# Patient Record
Sex: Female | Born: 1955
Health system: Southern US, Community
[De-identification: ages and names within clinical notes are randomized; demographics above are authoritative.]

## PROBLEM LIST (undated history)

## (undated) DIAGNOSIS — H269 Unspecified cataract: Secondary | ICD-10-CM

## (undated) DIAGNOSIS — S82899A Other fracture of unspecified lower leg, initial encounter for closed fracture: Secondary | ICD-10-CM

## (undated) DIAGNOSIS — E28319 Asymptomatic premature menopause: Secondary | ICD-10-CM

## (undated) DIAGNOSIS — I7300 Raynaud's syndrome without gangrene: Secondary | ICD-10-CM

## (undated) DIAGNOSIS — Z8781 Personal history of (healed) traumatic fracture: Secondary | ICD-10-CM

## (undated) DIAGNOSIS — T7840XA Allergy, unspecified, initial encounter: Secondary | ICD-10-CM

## (undated) DIAGNOSIS — Z87440 Personal history of urinary (tract) infections: Secondary | ICD-10-CM

## (undated) DIAGNOSIS — B019 Varicella without complication: Secondary | ICD-10-CM

## (undated) DIAGNOSIS — J310 Chronic rhinitis: Secondary | ICD-10-CM

## (undated) DIAGNOSIS — C4490 Unspecified malignant neoplasm of skin, unspecified: Secondary | ICD-10-CM

## (undated) DIAGNOSIS — K219 Gastro-esophageal reflux disease without esophagitis: Secondary | ICD-10-CM

## (undated) DIAGNOSIS — N159 Renal tubulo-interstitial disease, unspecified: Secondary | ICD-10-CM

## (undated) DIAGNOSIS — C259 Malignant neoplasm of pancreas, unspecified: Secondary | ICD-10-CM

## (undated) HISTORY — DX: Renal tubulo-interstitial disease, unspecified: N15.9

## (undated) HISTORY — DX: Unspecified cataract: H26.9

## (undated) HISTORY — PX: TONSILLECTOMY AND ADENOIDECTOMY: SHX28

## (undated) HISTORY — DX: Unspecified malignant neoplasm of skin, unspecified: C44.90

## (undated) HISTORY — DX: Asymptomatic premature menopause: E28.319

## (undated) HISTORY — DX: Personal history of urinary (tract) infections: Z87.440

## (undated) HISTORY — PX: JOINT REPLACEMENT: SHX530

## (undated) HISTORY — DX: Gastro-esophageal reflux disease without esophagitis: K21.9

## (undated) HISTORY — DX: Allergy, unspecified, initial encounter: T78.40XA

## (undated) HISTORY — DX: Varicella without complication: B01.9

## (undated) HISTORY — PX: CARPOMETACARPAL JOINT ARTHROTOMY: SUR102

## (undated) HISTORY — DX: Chronic rhinitis: J31.0

## (undated) HISTORY — DX: Raynaud's syndrome without gangrene: I73.00

## (undated) HISTORY — DX: Other fracture of unspecified lower leg, initial encounter for closed fracture: S82.899A

## (undated) HISTORY — PX: ABDOMINAL HYSTERECTOMY: SHX81

## (undated) HISTORY — PX: EYE SURGERY: SHX253

## (undated) HISTORY — DX: Personal history of (healed) traumatic fracture: Z87.81

---

## 1978-01-06 HISTORY — PX: ORIF ANKLE FRACTURE: SHX5408

## 1998-01-06 HISTORY — PX: TOTAL ABDOMINAL HYSTERECTOMY W/ BILATERAL SALPINGOOPHORECTOMY: SHX83

## 2003-10-11 ENCOUNTER — Ambulatory Visit: Payer: Self-pay | Admitting: Unknown Physician Specialty

## 2003-10-17 ENCOUNTER — Ambulatory Visit: Payer: Self-pay | Admitting: General Surgery

## 2003-10-19 ENCOUNTER — Ambulatory Visit: Payer: Self-pay | Admitting: Unknown Physician Specialty

## 2004-10-24 ENCOUNTER — Ambulatory Visit: Payer: Self-pay | Admitting: Unknown Physician Specialty

## 2005-10-14 ENCOUNTER — Ambulatory Visit: Payer: Self-pay | Admitting: Gastroenterology

## 2005-11-18 ENCOUNTER — Ambulatory Visit: Payer: Self-pay | Admitting: Unknown Physician Specialty

## 2006-11-24 ENCOUNTER — Ambulatory Visit: Payer: Self-pay | Admitting: Unknown Physician Specialty

## 2007-11-25 ENCOUNTER — Ambulatory Visit: Payer: Self-pay | Admitting: Unknown Physician Specialty

## 2008-11-27 ENCOUNTER — Ambulatory Visit: Payer: Self-pay | Admitting: Unknown Physician Specialty

## 2009-10-16 ENCOUNTER — Ambulatory Visit: Payer: Self-pay | Admitting: Unknown Physician Specialty

## 2010-01-23 ENCOUNTER — Ambulatory Visit: Payer: Self-pay | Admitting: Unknown Physician Specialty

## 2010-10-17 ENCOUNTER — Ambulatory Visit: Payer: Self-pay | Admitting: Unknown Physician Specialty

## 2010-10-18 ENCOUNTER — Ambulatory Visit: Payer: Self-pay | Admitting: Unknown Physician Specialty

## 2010-10-25 LAB — HM MAMMOGRAPHY: HM Mammogram: NORMAL

## 2011-03-25 ENCOUNTER — Encounter: Payer: Self-pay | Admitting: Internal Medicine

## 2011-03-25 ENCOUNTER — Ambulatory Visit (INDEPENDENT_AMBULATORY_CARE_PROVIDER_SITE_OTHER): Payer: BC Managed Care – PPO | Admitting: Internal Medicine

## 2011-03-25 VITALS — BP 120/84 | HR 68 | Temp 98.3°F | Ht 62.75 in | Wt 177.0 lb

## 2011-03-25 DIAGNOSIS — T7840XA Allergy, unspecified, initial encounter: Secondary | ICD-10-CM | POA: Insufficient documentation

## 2011-03-25 DIAGNOSIS — E28319 Asymptomatic premature menopause: Secondary | ICD-10-CM

## 2011-03-25 DIAGNOSIS — J310 Chronic rhinitis: Secondary | ICD-10-CM

## 2011-03-25 DIAGNOSIS — D235 Other benign neoplasm of skin of trunk: Secondary | ICD-10-CM

## 2011-03-25 DIAGNOSIS — J309 Allergic rhinitis, unspecified: Secondary | ICD-10-CM | POA: Insufficient documentation

## 2011-03-25 DIAGNOSIS — E669 Obesity, unspecified: Secondary | ICD-10-CM

## 2011-03-25 DIAGNOSIS — Z8781 Personal history of (healed) traumatic fracture: Secondary | ICD-10-CM | POA: Insufficient documentation

## 2011-03-25 DIAGNOSIS — D225 Melanocytic nevi of trunk: Secondary | ICD-10-CM
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
## 2011-03-25 NOTE — Patient Instructions (Signed)
.  Consider the Low Glycemic Index Diet and 6 smaller meals daily :   7 AM Low carbohydrate Protein  Shakes (EAS Carb Control  Or Atkins ,  Available everywhere,   In  cases at [employer] )  2.5 carbs  (Add or substitute a toasted sandwhich thin w/ peanut butter)  10 AM: Protein bar by Atkins (snack size,  Chocolate lover's variety at  [employer])    Lunch: sandwich on pita bread or flatbread (Joseph's makes a pita bread and a flat bread , available at [employer] and [employer]; Toufayah makes a low carb flatbread available at [employer] and [employer])   3 PM:  Mid day :  Another protein bar,  Or a  cheese stick, 1/4 cup of almonds, walnuts, pistachios, pecans, peanuts,  Macadamia nuts  6 PM  Dinner:  "mean and green:"  Meat/chicken/fish, salad, and green veggie : use ranch, vinagrette,  Blue cheese, etc  9 PM snack : Breyer's low carb fudgiscle or  ice cream bar (Carb Smart) Weight Watcher's ice cream bar , or another protein shake 

## 2011-03-25 NOTE — Assessment & Plan Note (Addendum)
prior trial of oral HRT was disastrous.  symptoms of vaginal atrophy well managed with topical/vaginal progesterone and .estriol cream.

## 2011-03-25 NOTE — Assessment & Plan Note (Signed)
Managed with astepro prescribbed by ENT after allergy testing was negative. record requested.

## 2011-03-26 ENCOUNTER — Encounter: Payer: Self-pay | Admitting: Internal Medicine

## 2011-06-04 ENCOUNTER — Other Ambulatory Visit: Payer: Self-pay | Admitting: Internal Medicine

## 2011-08-05 ENCOUNTER — Telehealth: Payer: Self-pay | Admitting: Internal Medicine

## 2011-08-05 DIAGNOSIS — Z1239 Encounter for other screening for malignant neoplasm of breast: Secondary | ICD-10-CM

## 2011-08-05 NOTE — Telephone Encounter (Signed)
Patient needs order for mammogram

## 2011-08-05 NOTE — Telephone Encounter (Signed)
Order on printer

## 2011-08-05 NOTE — Telephone Encounter (Signed)
Pt called she stated that her company does mammograms for free but she needs order to do this Appointment 10/22/11  [email or web address]  Please fax order to [phone]  ATTEN Danne Baxter

## 2011-08-06 NOTE — Telephone Encounter (Signed)
Order has been faxed to the number provided.

## 2011-10-08 LAB — HM PAP SMEAR: HM Pap smear: NORMAL

## 2011-10-22 ENCOUNTER — Ambulatory Visit: Payer: Self-pay | Admitting: Internal Medicine

## 2011-10-22 LAB — HM MAMMOGRAPHY: HM Mammogram: NORMAL

## 2011-10-24 ENCOUNTER — Other Ambulatory Visit: Payer: Self-pay

## 2011-10-27 MED ORDER — MELOXICAM 15 MG PO TABS: 15.0000 mg | ORAL_TABLET | Freq: Every day | ORAL | Status: DC

## 2011-10-27 MED ORDER — ESOMEPRAZOLE MAGNESIUM 40 MG PO CPDR: 40.0000 mg | DELAYED_RELEASE_CAPSULE | Freq: Every day | ORAL | Status: DC

## 2011-10-27 NOTE — Telephone Encounter (Signed)
R'cd fax from [facility] for refill of Meloxicam and Nexium.

## 2011-10-29 ENCOUNTER — Ambulatory Visit (INDEPENDENT_AMBULATORY_CARE_PROVIDER_SITE_OTHER): Payer: BC Managed Care – PPO | Admitting: Internal Medicine

## 2011-10-29 ENCOUNTER — Encounter: Payer: Self-pay | Admitting: Internal Medicine

## 2011-10-29 ENCOUNTER — Other Ambulatory Visit (HOSPITAL_COMMUNITY)
Admission: RE | Admit: 2011-10-29 | Discharge: 2011-10-29 | Disposition: A | Discharge status: Home / Self Care | Payer: BC Managed Care – PPO | Source: Ambulatory Visit | Attending: Internal Medicine | Admitting: Internal Medicine

## 2011-10-29 VITALS — BP 120/78 | HR 87 | Temp 97.5°F | Ht 62.75 in | Wt 181.0 lb

## 2011-10-29 DIAGNOSIS — R5383 Other fatigue: Secondary | ICD-10-CM

## 2011-10-29 DIAGNOSIS — Z01419 Encounter for gynecological examination (general) (routine) without abnormal findings: Secondary | ICD-10-CM | POA: Insufficient documentation

## 2011-10-29 DIAGNOSIS — Z0000 Encounter for general adult medical examination without abnormal findings: Secondary | ICD-10-CM

## 2011-10-29 DIAGNOSIS — Z23 Encounter for immunization: Secondary | ICD-10-CM

## 2011-10-29 DIAGNOSIS — Z1151 Encounter for screening for human papillomavirus (HPV): Secondary | ICD-10-CM | POA: Insufficient documentation

## 2011-10-29 DIAGNOSIS — M76899 Other specified enthesopathies of unspecified lower limb, excluding foot: Secondary | ICD-10-CM

## 2011-10-29 DIAGNOSIS — R5381 Other malaise: Secondary | ICD-10-CM

## 2011-10-29 DIAGNOSIS — Z13220 Encounter for screening for lipoid disorders: Secondary | ICD-10-CM

## 2011-10-29 DIAGNOSIS — M7072 Other bursitis of hip, left hip: Secondary | ICD-10-CM | POA: Insufficient documentation

## 2011-10-29 LAB — LIPID PANEL
Cholesterol: 228 mg/dL — ABNORMAL HIGH (ref 0–200)
Total CHOL/HDL Ratio: 5
Triglycerides: 172 mg/dL — ABNORMAL HIGH (ref 0.0–149.0)

## 2011-10-29 LAB — COMPREHENSIVE METABOLIC PANEL
AST: 20 U/L (ref 0–37)
Albumin: 3.9 g/dL (ref 3.5–5.2)
Alkaline Phosphatase: 53 U/L (ref 39–117)
Total Protein: 7.6 g/dL (ref 6.0–8.3)

## 2011-10-29 NOTE — Assessment & Plan Note (Signed)
Referral to [facility] for joint injection .  Apparently Dr Lin Givens was injecting the mid lateral thigh.

## 2011-10-29 NOTE — Progress Notes (Signed)
Patient ID: Kathleen Russell, female   DOB: [birth year], 56 y.o.   MRN: [id number]    Subjective:     Kathleen Russell is a 56 y.o. female and is here for a comprehensive physical exam. The patient reports .  History   Social History  . Marital Status: Married    Spouse Name: N/A    Number of Children: N/A  . Years of Education: N/A   Occupational History  . editor     [employer]   Social History Main Topics  . Smoking status: Never Smoker   . Smokeless tobacco: Never Used  . Alcohol Use: No  . Drug Use: No  . Sexually Active:    Other Topics Concern  . Not on file   Social History Narrative  . No narrative on file   Health Maintenance  Topic Date Due  . Pap Smear  07/12/1972  . Influenza Vaccine  09/06/2012  . Mammogram  10/28/2013  . Colonoscopy  03/25/2014  . Tetanus/tdap  10/28/2021    The following portions of the patient's history were reviewed and updated as appropriate: allergies, current medications, past family history, past medical history, past social history, past surgical history and problem list.  Review of Systems A comprehensive review of systems was negative except for: Musculoskeletal: positive for arthralgias   Objective:   BP 120/78  Pulse 87  Temp 97.5 F (36.4 C)  Ht 5' 2.75" (1.594 m)  Wt 181 lb (82.101 kg)  BMI 32.32 kg/m2  SpO2 98%    General appearance: alert, cooperative and appears stated age Ears: normal TM's and external ear canals both ears Throat: lips, mucosa, and tongue normal; teeth and gums normal Neck: no adenopathy, no carotid bruit, supple, symmetrical, trachea midline and thyroid not enlarged, symmetric, no tenderness/mass/nodules Back: symmetric, no curvature. ROM normal. No CVA tenderness. Lungs: clear to auscultation bilaterally Breasts: breasts appear normal, no suspicious masses, no skin or nipple changes or axillary nodes. Heart: regular rate and rhythm, S1, S2 normal, no murmur, click, rub or  gallop Abdomen: soft, non-tender; bowel sounds normal; no masses,  no organomegaly Pulses: 2+ and symmetric Skin: Skin color, texture, turgor normal. No rashes or lesions Lymph nodes: Cervical, supraclavicular, and axillary nodes normal.   Assessment:   Bursitis of left hip Referral to [facility] for joint injection .  Apparently Dr Lin Givens was injecting the mid lateral thigh.   Routine general medical examination at a health care facility Breast exam is normal. She is up-to-date on mammograms. Pelvic was deferred since she is status post TAH/BSO.   Updated Medication List Outpatient Encounter Prescriptions as of 10/29/2011  Medication Sig Dispense Refill  . ASTEPRO 0.15 % SOLN       . Calcium-Vitamin D (CALTRATE 600 PLUS-VIT D PO) Take by mouth.      . cetirizine (ZYRTEC) 10 MG tablet Take 10 mg by mouth daily.      Marland Kitchen conjugated estrogens (PREMARIN) vaginal cream Place vaginally daily.      . cyanocobalamin 100 MCG tablet Take 1,000 mcg by mouth daily.      Marland Kitchen esomeprazole (NEXIUM) 40 MG capsule Take 1 capsule (40 mg total) by mouth daily before breakfast.  30 capsule  5  . Estriol POWD       . meloxicam (MOBIC) 15 MG tablet Take 1 tablet (15 mg total) by mouth daily.  30 tablet  5  . Multiple Vitamins-Minerals (CENTRUM SILVER ULTRA WOMENS PO) Take by mouth.      Marland Kitchen  vitamin E 1000 UNIT capsule Take 1,000 Units by mouth daily.

## 2011-10-31 ENCOUNTER — Encounter: Payer: Self-pay | Admitting: Internal Medicine

## 2011-10-31 DIAGNOSIS — Z0000 Encounter for general adult medical examination without abnormal findings: Secondary | ICD-10-CM | POA: Insufficient documentation

## 2011-10-31 NOTE — Assessment & Plan Note (Signed)
Breast exam is normal. She is up-to-date on mammograms. Pelvic was deferred since she is status post TAH/BSO.

## 2012-02-12 ENCOUNTER — Other Ambulatory Visit: Payer: Self-pay | Admitting: *Deleted

## 2012-02-12 NOTE — Telephone Encounter (Signed)
Refill request  Estriol 1mg /gm Vag cream  #60  Use 0.5gm vaginally once to twice a week as needed

## 2012-02-16 ENCOUNTER — Telehealth: Payer: Self-pay | Admitting: *Deleted

## 2012-02-16 NOTE — Telephone Encounter (Signed)
2nd request on this med originally sent to you on 2/6. Ok to fill?

## 2012-02-16 NOTE — Telephone Encounter (Signed)
Estriol 1mg /gm vag cream   #60  Use 0.5mg -1gm vaginally once to twice a week as needed

## 2012-02-16 NOTE — Telephone Encounter (Signed)
There is no such thing in EPIC as "Estriol" cream, only powder.  We have not filled it for her before but will be happy to if you will clarify with pharmacy.  Perhaps they mean Estrace?

## 2012-02-18 ENCOUNTER — Other Ambulatory Visit: Payer: Self-pay | Admitting: General Practice

## 2012-02-18 MED ORDER — ESTRIOL POWD: Status: DC

## 2012-02-18 NOTE — Telephone Encounter (Signed)
Called pharmacy and put in this order. Also updated pt record.

## 2012-04-30 ENCOUNTER — Other Ambulatory Visit: Payer: Self-pay | Admitting: *Deleted

## 2012-04-30 MED ORDER — ESOMEPRAZOLE MAGNESIUM 40 MG PO CPDR: 40.0000 mg | DELAYED_RELEASE_CAPSULE | Freq: Every day | ORAL | Status: DC

## 2012-07-01 ENCOUNTER — Telehealth: Payer: Self-pay | Admitting: *Deleted

## 2012-07-01 NOTE — Telephone Encounter (Signed)
Refill Request  Progesterone 3% HRT Cream  Apply topically nightly as directed

## 2012-07-08 MED ORDER — ESTROGENS, CONJUGATED 0.625 MG/GM VA CREA: TOPICAL_CREAM | Freq: Every day | VAGINAL | Status: DC

## 2012-07-08 NOTE — Telephone Encounter (Signed)
Refill sent with note patient needs follow up appointment.

## 2012-08-02 ENCOUNTER — Ambulatory Visit (INDEPENDENT_AMBULATORY_CARE_PROVIDER_SITE_OTHER): Payer: BC Managed Care – PPO | Admitting: Adult Health

## 2012-08-02 VITALS — BP 110/68 | HR 114 | Temp 98.6°F | Resp 12 | Wt 184.5 lb

## 2012-08-02 DIAGNOSIS — R197 Diarrhea, unspecified: Secondary | ICD-10-CM | POA: Insufficient documentation

## 2012-08-02 DIAGNOSIS — K529 Noninfective gastroenteritis and colitis, unspecified: Secondary | ICD-10-CM | POA: Insufficient documentation

## 2012-08-02 LAB — CBC WITH DIFFERENTIAL/PLATELET
Eosinophils Relative: 1 % (ref 0.0–5.0)
HCT: 42.7 % (ref 36.0–46.0)
Hemoglobin: 14.3 g/dL (ref 12.0–15.0)
Lymphocytes Relative: 27.5 % (ref 12.0–46.0)
Lymphs Abs: 1.6 10*3/uL (ref 0.7–4.0)
Monocytes Relative: 12 % (ref 3.0–12.0)
Neutro Abs: 3.5 10*3/uL (ref 1.4–7.7)
Platelets: 227 10*3/uL (ref 150.0–400.0)
WBC: 5.9 10*3/uL (ref 4.5–10.5)

## 2012-08-02 LAB — COMPREHENSIVE METABOLIC PANEL
CO2: 26 mEq/L (ref 19–32)
Calcium: 9 mg/dL (ref 8.4–10.5)
Creatinine, Ser: 0.9 mg/dL (ref 0.4–1.2)
GFR: 67.47 mL/min (ref >60.00)
Glucose, Bld: 119 mg/dL — ABNORMAL HIGH (ref 70–99)
Total Bilirubin: 0.5 mg/dL (ref 0.3–1.2)

## 2012-08-02 NOTE — Assessment & Plan Note (Addendum)
Suspect viral source. Check cbc, bmet. If white count elevated will start antibiotic. Bland diet and advance as tolerated. Instructions given to patient.

## 2012-08-02 NOTE — Progress Notes (Signed)
  Subjective:    Patient ID: Kathleen Russell, female    DOB: 1955-12-28, 57 y.o.   MRN: [id number]  HPI  Patient is a pleasant 57 y/o female who presents with chills, diarrhea, cramping that began on Thursday night. She vomited once on Saturday. She ran a fever of 102. She reports fever broke on Sunday morning when she began to feel better. She has been drinking Pedialyte. She has not been eating. She has not been drinking much either. Today she reports that her symptoms are slowing improving. No fever this morning. No diarrhea.  Current Outpatient Prescriptions on File Prior to Visit  Medication Sig Dispense Refill  . Calcium-Vitamin D (CALTRATE 600 PLUS-VIT D PO) Take by mouth.      . cetirizine (ZYRTEC) 10 MG tablet Take 10 mg by mouth daily.      Marland Kitchen esomeprazole (NEXIUM) 40 MG capsule Take 1 capsule (40 mg total) by mouth daily before breakfast.  30 capsule  5  . Estriol POWD Pt uses 1/2-1g vaginally one to two times weekly as needed.  60 g  2  . meloxicam (MOBIC) 15 MG tablet Take 1 tablet (15 mg total) by mouth daily.  30 tablet  5  . cyanocobalamin 100 MCG tablet Take 1,000 mcg by mouth daily.       No current facility-administered medications on file prior to visit.    Review of Systems  Constitutional: Positive for fever and chills.  Respiratory: Negative.   Cardiovascular: Negative.   Gastrointestinal: Positive for vomiting, abdominal pain and diarrhea.       Cramping  Neurological: Positive for light-headedness.  Psychiatric/Behavioral: Negative.    BP 110/68  Pulse 114  Temp(Src) 98.6 F (37 C) (Oral)  Resp 12  Wt 184 lb 8 oz (83.689 kg)  BMI 32.94 kg/m2  SpO2 97%    Objective:   Physical Exam  Constitutional: She is oriented to person, place, and time. No distress.  Overweight, pleasant female appears to not be feeling well.  Cardiovascular: Regular rhythm and normal heart sounds.  Exam reveals no gallop and no friction rub.   No murmur heard. tachycardia   Pulmonary/Chest: Effort normal and breath sounds normal. No respiratory distress.  Abdominal: Soft. Bowel sounds are normal. She exhibits no mass. There is tenderness. There is no rebound.  Neurological: She is alert and oriented to person, place, and time.  Skin: Skin is warm and dry.  Psychiatric: She has a normal mood and affect. Her behavior is normal. Judgment and thought content normal.       Assessment & Plan:

## 2012-08-02 NOTE — Patient Instructions (Addendum)
Diarrhea Diarrhea is frequent loose and watery bowel movements. It can cause you to feel weak and dehydrated. Dehydration can cause you to become tired and thirsty, have a dry mouth, and have decreased urination that often is dark yellow. Diarrhea is a sign of another problem, most often an infection that will not last long. In most cases, diarrhea typically lasts 2 3 days. However, it can last longer if it is a sign of something more serious. It is important to treat your diarrhea as directed by your caregive to lessen or prevent future episodes of diarrhea. CAUSES  Some common causes include:  Gastrointestinal infections caused by viruses, bacteria, or parasites.  Food poisoning or food allergies.  Certain medicines, such as antibiotics, chemotherapy, and laxatives.  Artificial sweeteners and fructose.  Digestive disorders. HOME CARE INSTRUCTIONS  Ensure adequate fluid intake (hydration): have 1 cup (8 oz) of fluid for each diarrhea episode. Avoid fluids that contain simple sugars or sports drinks, fruit juices, whole milk products, and sodas. Your urine should be clear or pale yellow if you are drinking enough fluids. Hydrate with an oral rehydration solution that you can purchase at pharmacies, retail stores, and online. You can prepare an oral rehydration solution at home by mixing the following ingredients together:    tsp table salt.   tsp baking soda.   tsp salt substitute containing potassium chloride.  1  tablespoons sugar.  1 L (34 oz) of water.  Certain foods and beverages may increase the speed at which food moves through the gastrointestinal (GI) tract. These foods and beverages should be avoided and include:  Caffeinated and alcoholic beverages.  High-fiber foods, such as raw fruits and vegetables, nuts, seeds, and whole grain breads and cereals.  Foods and beverages sweetened with sugar alcohols, such as xylitol, sorbitol, and mannitol.  Some foods may be well  tolerated and may help thicken stool including:  Starchy foods, such as rice, toast, pasta, low-sugar cereal, oatmeal, grits, baked potatoes, crackers, and bagels.  Bananas.  Applesauce.  Add probiotic-rich foods to help increase healthy bacteria in the GI tract, such as yogurt and fermented milk products.  Wash your hands well after each diarrhea episode.  Only take over-the-counter or prescription medicines as directed by your caregiver.  Take a warm bath to relieve any burning or pain from frequent diarrhea episodes. SEEK IMMEDIATE MEDICAL CARE IF:   You are unable to keep fluids down.  You have persistent vomiting.  You have blood in your stool, or your stools are black and tarry.  You do not urinate in 6 8 hours, or there is only a small amount of very dark urine.  You have abdominal pain that increases or localizes.  You have weakness, dizziness, confusion, or lightheadedness.  You have a severe headache.  Your diarrhea gets worse or does not get better.  You have a fever or persistent symptoms for more than 2 3 days.  You have a fever and your symptoms suddenly get worse. MAKE SURE YOU:   Understand these instructions.  Will watch your condition.  Will get help right away if you are not doing well or get worse. Document Released: 12/13/2001 Document Revised: 12/10/2011 Document Reviewed: 08/31/2011 [facility] Patient Information 2014 [place], [place]. Diarrhea Diarrhea is frequent loose and watery bowel movements. It can cause you to feel weak and dehydrated. Dehydration can cause you to become tired and thirsty, have a dry mouth, and have decreased urination that often is dark yellow. Diarrhea is a  sign of another problem, most often an infection that will not last long. In most cases, diarrhea typically lasts 2 3 days. However, it can last longer if it is a sign of something more serious. It is important to treat your diarrhea as directed by your caregive to lessen  or prevent future episodes of diarrhea. CAUSES  Some common causes include:  Gastrointestinal infections caused by viruses, bacteria, or parasites.  Food poisoning or food allergies.  Certain medicines, such as antibiotics, chemotherapy, and laxatives.  Artificial sweeteners and fructose.  Digestive disorders. HOME CARE INSTRUCTIONS  Ensure adequate fluid intake (hydration): have 1 cup (8 oz) of fluid for each diarrhea episode. Avoid fluids that contain simple sugars or sports drinks, fruit juices, whole milk products, and sodas. Your urine should be clear or pale yellow if you are drinking enough fluids. Hydrate with an oral rehydration solution that you can purchase at pharmacies, retail stores, and online. You can prepare an oral rehydration solution at home by mixing the following ingredients together:    tsp table salt.   tsp baking soda.   tsp salt substitute containing potassium chloride.  1  tablespoons sugar.  1 L (34 oz) of water.  Certain foods and beverages may increase the speed at which food moves through the gastrointestinal (GI) tract. These foods and beverages should be avoided and include:  Caffeinated and alcoholic beverages.  High-fiber foods, such as raw fruits and vegetables, nuts, seeds, and whole grain breads and cereals.  Foods and beverages sweetened with sugar alcohols, such as xylitol, sorbitol, and mannitol.  Some foods may be well tolerated and may help thicken stool including:  Starchy foods, such as rice, toast, pasta, low-sugar cereal, oatmeal, grits, baked potatoes, crackers, and bagels.  Bananas.  Applesauce.  Add probiotic-rich foods to help increase healthy bacteria in the GI tract, such as yogurt and fermented milk products.  Wash your hands well after each diarrhea episode.  Only take over-the-counter or prescription medicines as directed by your caregiver.  Take a warm bath to relieve any burning or pain from frequent diarrhea  episodes. SEEK IMMEDIATE MEDICAL CARE IF:   You are unable to keep fluids down.  You have persistent vomiting.  You have blood in your stool, or your stools are black and tarry.  You do not urinate in 6 8 hours, or there is only a small amount of very dark urine.  You have abdominal pain that increases or localizes.  You have weakness, dizziness, confusion, or lightheadedness.  You have a severe headache.  Your diarrhea gets worse or does not get better.  You have a fever or persistent symptoms for more than 2 3 days.  You have a fever and your symptoms suddenly get worse. MAKE SURE YOU:   Understand these instructions.  Will watch your condition.  Will get help right away if you are not doing well or get worse. Document Released: 12/13/2001 Document Revised: 12/10/2011 Document Reviewed: 08/31/2011 [facility] Patient Information 2014 [place], [place].

## 2012-08-03 ENCOUNTER — Encounter: Payer: Self-pay | Admitting: Adult Health

## 2012-08-03 ENCOUNTER — Other Ambulatory Visit: Payer: Self-pay | Admitting: *Deleted

## 2012-08-03 NOTE — Telephone Encounter (Signed)
Refill Request  Progesterone 3% HRT CRM   Apply topically nightly as directed

## 2012-08-06 NOTE — Telephone Encounter (Signed)
Do you refill the progesterone cream? Refill Meloxicam? Last visit 10/13

## 2012-08-09 ENCOUNTER — Other Ambulatory Visit: Payer: Self-pay | Admitting: *Deleted

## 2012-08-13 MED ORDER — MELOXICAM 15 MG PO TABS: 15.0000 mg | ORAL_TABLET | Freq: Every day | ORAL | Status: DC

## 2012-08-13 NOTE — Telephone Encounter (Signed)
cpe was 10/13. Refill?

## 2012-08-16 ENCOUNTER — Other Ambulatory Visit: Payer: Self-pay | Admitting: Internal Medicine

## 2012-08-16 MED ORDER — MELOXICAM 15 MG PO TABS: 15.0000 mg | ORAL_TABLET | Freq: Every day | ORAL | Status: DC

## 2012-08-16 MED ORDER — ESTRIOL POWD: Status: DC

## 2012-08-16 NOTE — Telephone Encounter (Signed)
Yes, both authorized.

## 2012-08-17 MED ORDER — NONFORMULARY OR COMPOUNDED ITEM: Status: DC

## 2012-09-14 ENCOUNTER — Telehealth: Payer: Self-pay | Admitting: Internal Medicine

## 2012-09-14 DIAGNOSIS — Z1239 Encounter for other screening for malignant neoplasm of breast: Secondary | ICD-10-CM

## 2012-09-14 NOTE — Telephone Encounter (Signed)
Pt called stating they do mammogram at her work and she wanted to know if you would send order to them Appointment scheduled for 10/9   Please fax to  [phone]  Danne Baxter @ [facility] Phone # [phone]  Please advise pt when this is done

## 2012-09-14 NOTE — Telephone Encounter (Signed)
Order made

## 2012-09-20 ENCOUNTER — Encounter: Payer: Self-pay | Admitting: Internal Medicine

## 2012-09-20 DIAGNOSIS — Z1239 Encounter for other screening for malignant neoplasm of breast: Secondary | ICD-10-CM

## 2012-09-21 ENCOUNTER — Telehealth: Payer: Self-pay | Admitting: Internal Medicine

## 2012-09-21 DIAGNOSIS — Z1239 Encounter for other screening for malignant neoplasm of breast: Secondary | ICD-10-CM

## 2012-09-21 NOTE — Telephone Encounter (Signed)
Patient requesting an order for mammogram per letter in yellow chart. rx on printer to sign

## 2012-09-24 NOTE — Telephone Encounter (Signed)
Pati8ent notified by detailed message script for [facility] faxed as requested.

## 2012-10-14 ENCOUNTER — Ambulatory Visit: Payer: Self-pay | Admitting: Internal Medicine

## 2012-11-02 ENCOUNTER — Other Ambulatory Visit: Payer: Self-pay | Admitting: *Deleted

## 2012-11-02 ENCOUNTER — Encounter: Payer: Self-pay | Admitting: Internal Medicine

## 2012-11-02 MED ORDER — ESOMEPRAZOLE MAGNESIUM 40 MG PO CPDR: 40.0000 mg | DELAYED_RELEASE_CAPSULE | Freq: Every day | ORAL | Status: DC

## 2012-11-02 NOTE — Telephone Encounter (Signed)
Appt 11/05/12 

## 2012-11-05 ENCOUNTER — Encounter: Payer: Self-pay | Admitting: Internal Medicine

## 2012-11-05 ENCOUNTER — Ambulatory Visit (INDEPENDENT_AMBULATORY_CARE_PROVIDER_SITE_OTHER): Payer: BC Managed Care – PPO | Admitting: Internal Medicine

## 2012-11-05 VITALS — BP 126/80 | HR 85 | Temp 98.0°F | Resp 12 | Ht 63.0 in | Wt 193.5 lb

## 2012-11-05 DIAGNOSIS — Z0000 Encounter for general adult medical examination without abnormal findings: Secondary | ICD-10-CM

## 2012-11-05 DIAGNOSIS — R635 Abnormal weight gain: Secondary | ICD-10-CM

## 2012-11-05 DIAGNOSIS — E669 Obesity, unspecified: Secondary | ICD-10-CM

## 2012-11-05 DIAGNOSIS — Z1159 Encounter for screening for other viral diseases: Secondary | ICD-10-CM

## 2012-11-05 DIAGNOSIS — E785 Hyperlipidemia, unspecified: Secondary | ICD-10-CM

## 2012-11-05 DIAGNOSIS — R739 Hyperglycemia, unspecified: Secondary | ICD-10-CM

## 2012-11-05 DIAGNOSIS — Z1211 Encounter for screening for malignant neoplasm of colon: Secondary | ICD-10-CM

## 2012-11-05 DIAGNOSIS — R7309 Other abnormal glucose: Secondary | ICD-10-CM

## 2012-11-05 DIAGNOSIS — R350 Frequency of micturition: Secondary | ICD-10-CM

## 2012-11-05 LAB — LIPID PANEL
Cholesterol: 225 mg/dL — ABNORMAL HIGH (ref 0–200)
VLDL: 27.8 mg/dL (ref 0.0–40.0)

## 2012-11-05 LAB — TSH: TSH: 4.09 u[IU]/mL (ref 0.35–5.50)

## 2012-11-05 LAB — COMPREHENSIVE METABOLIC PANEL
ALT: 16 U/L (ref 0–35)
AST: 18 U/L (ref 0–37)
Albumin: 4 g/dL (ref 3.5–5.2)
Alkaline Phosphatase: 58 U/L (ref 39–117)
Chloride: 103 mEq/L (ref 96–112)
Potassium: 4.3 mEq/L (ref 3.5–5.1)
Sodium: 138 mEq/L (ref 135–145)
Total Protein: 7.1 g/dL (ref 6.0–8.3)

## 2012-11-05 LAB — LDL CHOLESTEROL, DIRECT: Direct LDL: 159.1 mg/dL

## 2012-11-05 NOTE — Patient Instructions (Addendum)
I am screening your for diabetes today given your weight gain and elevated blood sugar  You goal weight is 168 lbs (to get lower your BMI  To < 30)  This is  my version of a  "Low GI"  Diet:  It will still lower your blood sugars and allow you to lose 4 to 8  lbs  per month if you follow it carefully.  Your goal with exercise is a minimum of 30 minutes of aerobic exercise 5 days per week (Walking does not count once it becomes easy!)    All of the foods can be found at grocery stores and in bulk at [employer].  The Atkins protein bars and shakes are available in more varieties at Target, WalMart and [employer].     7 AM Breakfast:  Choose from the following:  Low carbohydrate Protein  Shakes (I recommend the EAS AdvantEdge "Carb Control" shakes  Or the low carb shakes by Atkins.    2.5 carbs   Arnold's "Sandwhich Thin"toasted  w/ peanut butter (no jelly: about 20 net carbs  "Bagel Thin" with cream cheese and salmon: about 20 carbs   a scrambled egg/bacon/cheese burrito made with Mission's "carb balance" whole wheat tortilla  (about 10 net carbs )   Avoid cereal and bananas, oatmeal and cream of wheat and grits. They are loaded with carbohydrates!   10 AM: high protein snack  Protein bar by Atkins (the snack size, under 200 cal, usually < 6 net carbs).    A stick of cheese:  Around 1 carb,  100 cal     Dannon Light n Fit [place] Yogurt  (80 cal, 8 carbs)  Other so called "protein bars" and Greek yogurts tend to be loaded with carbohydrates.  Remember, in food advertising, the word "energy" is synonymous for " carbohydrate."  Lunch:   A Sandwich using the bread choices listed, Can use any  Eggs,  lunchmeat, grilled meat or canned tuna), avocado, regular mayo/mustard  and cheese.  A Salad using blue cheese, ranch,  Goddess or vinagrette,  No croutons or "confetti" and no "candied nuts" but regular nuts OK.   No pretzels or chips.  Pickles and miniature sweet peppers are a good low carb  alternative that provide a "crunch"  The bread is the only source of carbohydrate in a sandwich and  can be decreased by trying some of these alternatives to traditional loaf bread  Joseph's makes a pita bread and a flat bread that are 50 cal and 4 net carbs available at BJs and WalMart.  This can be toasted to use with hummous as well  Toufayan makes a low carb flatbread that's 100 cal and 9 net carbs available at [employer] and [employer] makes 2 sizes of  Low carb whole wheat tortilla  (The large one is 210 cal and 6 net carbs) Avoid "Low fat dressings, as well as Reyne Dumas and [address] dressings They are loaded with sugar!   3 PM/ Mid day  Snack:  Consider  1 ounce of  almonds, walnuts, pistachios, pecans, peanuts,  Macadamia nuts or a nut medley.  Avoid "granola"; the dried cranberries and raisins are loaded with carbohydrates. Mixed nuts as long as there are no raisins,  cranberries or dried fruit.     6 PM  Dinner:     Meat/fowl/fish with a green salad, and either broccoli, cauliflower, green beans, spinach, brussel sprouts or  Lima beans. DO NOT BREAD  THE PROTEIN!!      There is a low carb pasta by Dreamfield's that is acceptable and tastes great: only 5 digestible carbs/serving.( All grocery stores but BJs carry it )  Try Hurley Cisco Angelo's chicken piccata or chicken or eggplant parm over low carb pasta.(Lowes and BJs)   Marjory Lies Sanchez's "Carnitas" (pulled pork, no sauce,  0 carbs) or his beef pot roast to make a dinner burrito (at BJ's)  Pesto over low carb pasta (bj's sells a good quality pesto in the center refrigerated section of the deli   Whole wheat pasta is still full of digestible carbs and  Not as low in glycemic index as Dreamfield's.   Brown rice is still rice,  So skip the rice and noodles if you eat [place] or [place] (or at least limit to 1/2 cup)  9 PM snack :   Breyer's "low carb" fudgsicle or  ice cream bar (Carb Smart line), or  Weight Watcher's ice cream bar ,  or another "no sugar added" ice cream;  a serving of fresh berries/cherries with whipped cream   Cheese or DANNON'S LlGHT N FIT [place] YOGURT  Avoid bananas, pineapple, grapes  and watermelon on a regular basis because they are high in sugar.  THINK OF THEM AS DESSERT  Remember that snack Substitutions should be less than 10 NET carbs per serving and meals < 20 carbs. Remember to subtract fiber grams to get the "net carbs."

## 2012-11-06 LAB — HEPATITIS C ANTIBODY: HCV Ab: NEGATIVE

## 2012-11-07 ENCOUNTER — Encounter: Payer: Self-pay | Admitting: Internal Medicine

## 2012-11-07 DIAGNOSIS — E785 Hyperlipidemia, unspecified: Secondary | ICD-10-CM | POA: Insufficient documentation

## 2012-11-07 NOTE — Progress Notes (Signed)
Patient ID: Kathleen Russell, female   DOB: April 21, 1955, 57 y.o.   MRN: [id number]  Subjective:     Kathleen Russell is a 57 y.o. female and is here for a comprehensive physical exam. The patient reports no problems.  History   Social History  . Marital Status: Married    Spouse Name: N/A    Number of Children: N/A  . Years of Education: N/A   Occupational History  . editor     [employer]   Social History Main Topics  . Smoking status: Never Smoker   . Smokeless tobacco: Never Used  . Alcohol Use: No  . Drug Use: No  . Sexual Activity: Yes    Birth Control/ Protection: Post-menopausal   Other Topics Concern  . Not on file   Social History Narrative  . No narrative on file   Health Maintenance  Topic Date Due  . Influenza Vaccine  08/06/2013  . Colonoscopy  03/25/2014  . Mammogram  10/15/2014  . Pap Smear  10/29/2014  . Tetanus/tdap  10/28/2021    The following portions of the patient's history were reviewed and updated as appropriate: allergies, current medications, past family history, past medical history, past social history, past surgical history and problem list.  Review of Systems A comprehensive review of systems was negative.   Objective:   BP 126/80  Pulse 85  Temp(Src) 98 F (36.7 C) (Oral)  Resp 12  Ht 5\' 3"  (1.6 m)  Wt 193 lb 8 oz (87.771 kg)  BMI 34.29 kg/m2  SpO2 97%  General Appearance:    Alert, cooperative, no distress, appears stated age  Head:    Normocephalic, without obvious abnormality, atraumatic  Eyes:    PERRL, conjunctiva/corneas clear, EOM's intact, fundi    benign, both eyes  Ears:    Normal TM's and external ear canals, both ears  Nose:   Nares normal, septum midline, mucosa normal, no drainage    or sinus tenderness  Throat:   Lips, mucosa, and tongue normal; teeth and gums normal  Neck:   Supple, symmetrical, trachea midline, no adenopathy;    thyroid:  no enlargement/tenderness/nodules; no carotid   bruit  or JVD  Back:     Symmetric, no curvature, ROM normal, no CVA tenderness  Lungs:     Clear to auscultation bilaterally, respirations unlabored  Chest Wall:    No tenderness or deformity   Heart:    Regular rate and rhythm, S1 and S2 normal, no murmur, rub   or gallop  Breast Exam:    No tenderness, masses, or nipple abnormality  Abdomen:     Soft, non-tender, bowel sounds active all four quadrants,    no masses, no organomegaly  Extremities:   Extremities normal, atraumatic, no cyanosis or edema  Pulses:   2+ and symmetric all extremities  Skin:   Skin color, texture, turgor normal, no rashes or lesions  Lymph nodes:   Cervical, supraclavicular, and axillary nodes normal  Neurologic:   CNII-XII intact, normal strength, sensation and reflexes    throughout   Assessment and Plan:  Routine general medical examination at a health care facility Annual comprehensive exam was done including breast and  Pelvic without PAP smear. All screenings have been addressed .   Obesity (BMI 30-39.9) I have addressed  BMI and recommended a low glycemic index diet utilizing smaller more frequent meals to increase metabolism.  I have also recommended that patient start exercising with a goal of 30 minutes  of aerobic exercise a minimum of 5 days per week. Screening for lipid disorders, thyroid and diabetes has been done .    Updated Medication List Outpatient Encounter Prescriptions as of 11/05/2012  Medication Sig  . Calcium-Vitamin D (CALTRATE 600 PLUS-VIT D PO) Take by mouth.  . cetirizine (ZYRTEC) 10 MG tablet Take 10 mg by mouth daily.  . cromolyn (NASALCROM) 5.2 MG/ACT nasal spray Place 1 spray into the nose at bedtime.  . cyanocobalamin 100 MCG tablet Take 1,000 mcg by mouth daily.  Marland Kitchen esomeprazole (NEXIUM) 40 MG capsule Take 1 capsule (40 mg total) by mouth daily before breakfast.  . Estriol POWD Pt uses 1/2-1g vaginally one to two times weekly as needed.  . meloxicam (MOBIC) 15 MG tablet Take 1  tablet (15 mg total) by mouth daily.  . NONFORMULARY OR COMPOUNDED ITEM Progesterone 3% HRT cream apply topically nightly as directed

## 2012-11-07 NOTE — Addendum Note (Signed)
Addended by: Sherlene Shams on: 11/07/2012 09:00 AM   Modules accepted: Orders

## 2012-11-07 NOTE — Assessment & Plan Note (Signed)
I have addressed  BMI and recommended a low glycemic index diet utilizing smaller more frequent meals to increase metabolism.  I have also recommended that patient start exercising with a goal of 30 minutes of aerobic exercise a minimum of 5 days per week. Screening for lipid disorders, thyroid and diabetes has  been done  °

## 2012-11-07 NOTE — Assessment & Plan Note (Signed)
Her 10 yr risk of CAD is 7.13%  .  Recommended repeat lipids in 6 months and low cholesterol diet.

## 2012-11-07 NOTE — Assessment & Plan Note (Signed)
Annual comprehensive exam was done including breast and  Pelvic without PAP smear. All screenings have been addressed .

## 2012-11-09 NOTE — Telephone Encounter (Signed)
Mailed unread message to pt  

## 2012-11-12 ENCOUNTER — Other Ambulatory Visit: Payer: BC Managed Care – PPO

## 2012-11-15 LAB — FECAL OCCULT BLOOD, IMMUNOCHEMICAL: Fecal Occult Bld: NEGATIVE

## 2012-11-16 ENCOUNTER — Encounter: Payer: Self-pay | Admitting: Internal Medicine

## 2013-02-16 ENCOUNTER — Encounter: Payer: Self-pay | Admitting: Internal Medicine

## 2013-02-17 MED ORDER — ESTROGENS, CONJUGATED 0.625 MG/GM VA CREA: TOPICAL_CREAM | Freq: Every day | VAGINAL | Status: DC

## 2013-05-05 ENCOUNTER — Other Ambulatory Visit: Payer: Self-pay | Admitting: Internal Medicine

## 2013-09-20 ENCOUNTER — Other Ambulatory Visit: Payer: Self-pay | Admitting: Internal Medicine

## 2013-09-20 NOTE — Telephone Encounter (Signed)
LAst appt 11/05/12, appt scheduled for 11/11/13. Refill?

## 2013-09-23 NOTE — Telephone Encounter (Signed)
Ok to refill,  Refill sent  

## 2013-10-25 ENCOUNTER — Encounter: Payer: Self-pay | Admitting: *Deleted

## 2013-10-25 ENCOUNTER — Ambulatory Visit: Payer: Self-pay | Admitting: Internal Medicine

## 2013-10-25 LAB — HM MAMMOGRAPHY: HM Mammogram: NEGATIVE

## 2013-10-31 ENCOUNTER — Other Ambulatory Visit: Payer: Self-pay | Admitting: Internal Medicine

## 2013-11-11 ENCOUNTER — Encounter: Payer: BC Managed Care – PPO | Admitting: Internal Medicine

## 2013-11-29 ENCOUNTER — Other Ambulatory Visit: Payer: Self-pay | Admitting: *Deleted

## 2013-11-29 MED ORDER — ESOMEPRAZOLE MAGNESIUM 40 MG PO CPDR: DELAYED_RELEASE_CAPSULE | ORAL | Status: DC

## 2013-12-13 ENCOUNTER — Other Ambulatory Visit: Payer: Self-pay | Admitting: Internal Medicine

## 2013-12-13 NOTE — Telephone Encounter (Signed)
Last OV 10.31.14, last refill 9.18.15.  Please advise refill

## 2013-12-14 NOTE — Telephone Encounter (Signed)
Refill denied .  Has to have at least reanl funciton (BMET) prior to refill

## 2014-01-11 ENCOUNTER — Other Ambulatory Visit: Payer: Self-pay | Admitting: *Deleted

## 2014-01-11 ENCOUNTER — Telehealth: Payer: Self-pay | Admitting: *Deleted

## 2014-01-11 MED ORDER — ESOMEPRAZOLE MAGNESIUM 40 MG PO CPDR: DELAYED_RELEASE_CAPSULE | ORAL | Status: DC

## 2014-01-11 NOTE — Telephone Encounter (Signed)
Fax from pharmacy requesting Nexium.  Last OV 10.31.14, pt had OV scheduled for 1.8.16 however cancelled due to MD emergency.  Next OV 1.26.16.  Last refill 9.30.15.  Please advise refill in Dr Lupita Dawn absence

## 2014-01-11 NOTE — Telephone Encounter (Signed)
rx sent

## 2014-01-11 NOTE — Telephone Encounter (Signed)
Ok to refill #30 nexium since appt had to be changed, but she needs to keep next appt.

## 2014-01-13 ENCOUNTER — Encounter: Payer: BC Managed Care – PPO | Admitting: Internal Medicine

## 2014-01-31 ENCOUNTER — Ambulatory Visit (INDEPENDENT_AMBULATORY_CARE_PROVIDER_SITE_OTHER): Payer: BLUE CROSS/BLUE SHIELD | Admitting: Internal Medicine

## 2014-01-31 ENCOUNTER — Encounter: Payer: Self-pay | Admitting: Internal Medicine

## 2014-01-31 VITALS — BP 112/78 | HR 84 | Temp 98.3°F | Resp 12 | Ht 62.5 in | Wt 178.5 lb

## 2014-01-31 DIAGNOSIS — Z0000 Encounter for general adult medical examination without abnormal findings: Secondary | ICD-10-CM

## 2014-01-31 DIAGNOSIS — M7072 Other bursitis of hip, left hip: Secondary | ICD-10-CM

## 2014-01-31 DIAGNOSIS — N952 Postmenopausal atrophic vaginitis: Secondary | ICD-10-CM

## 2014-01-31 DIAGNOSIS — E669 Obesity, unspecified: Secondary | ICD-10-CM

## 2014-01-31 MED ORDER — ESTROGENS, CONJUGATED 0.625 MG/GM VA CREA: TOPICAL_CREAM | VAGINAL | Status: DC

## 2014-01-31 NOTE — Patient Instructions (Addendum)
You can add up to 2000 mg of tylenol daily to your daily meloxicam  We are resuming vaginal estrogen   Premier Protein,  Muscle Milk, EAS and ATkins are all low carb shakes that are great 3 hr snacks  KIND bars,  3 to 5 g sugar,  High fiber ,  No sugar alcohols.  All also excellent choices  I recommend getting the majority of your calcium and Vitamin D  through diet rather than supplements given the recent association of calcium supplements with increased coronary artery calcium scores (You need 1200 mg daily )   Unsweetened almond/coconut milk is a great low calorie low carb, cholesterol free  way to increase your dietary calcium and vitamin D.  Try the blue Jackquline Bosch  Please return for fasting labs ASAP and i'll check thyroid  Vitamin d as well,    Will refill meloxicam once i confirm normal kidney function   Health Maintenance Adopting a healthy lifestyle and getting preventive care can go a long way to promote health and wellness. Talk with your health care provider about what schedule of regular examinations is right for you. This is a good chance for you to check in with your provider about disease prevention and staying healthy. In between checkups, there are plenty of things you can do on your own. Experts have done a lot of research about which lifestyle changes and preventive measures are most likely to keep you healthy. Ask your health care provider for more information. WEIGHT AND DIET  Eat a healthy diet  Be sure to include plenty of vegetables, fruits, low-fat dairy products, and lean protein.  Do not eat a lot of foods high in solid fats, added sugars, or salt.  Get regular exercise. This is one of the most important things you can do for your health.  Most adults should exercise for at least 150 minutes each week. The exercise should increase your heart rate and make you sweat (moderate-intensity exercise).  Most adults should also do strengthening exercises at  least twice a week. This is in addition to the moderate-intensity exercise.  Maintain a healthy weight  Body mass index (BMI) is a measurement that can be used to identify possible weight problems. It estimates body fat based on height and weight. Your health care provider can help determine your BMI and help you achieve or maintain a healthy weight.  For females 14 years of age and older:   A BMI below 18.5 is considered underweight.  A BMI of 18.5 to 24.9 is normal.  A BMI of 25 to 29.9 is considered overweight.  A BMI of 30 and above is considered obese.  Watch levels of cholesterol and blood lipids  You should start having your blood tested for lipids and cholesterol at 60 years of age, then have this test every 5 years.  You may need to have your cholesterol levels checked more often if:  Your lipid or cholesterol levels are high.  You are older than 60 years of age.  You are at high risk for heart disease.  CANCER SCREENING   Lung Cancer  Lung cancer screening is recommended for adults 64-31 years old who are at high risk for lung cancer because of a history of smoking.  A yearly low-dose CT scan of the lungs is recommended for people who:  Currently smoke.  Have quit within the past 15 years.  Have at least a 30-pack-year history of smoking. A pack year is  smoking an average of one pack of cigarettes a day for 1 year.  Yearly screening should continue until it has been 15 years since you quit.  Yearly screening should stop if you develop a health problem that would prevent you from having lung cancer treatment.  Breast Cancer  Practice breast self-awareness. This means understanding how your breasts normally appear and feel.  It also means doing regular breast self-exams. Let your health care provider know about any changes, no matter how small.  If you are in your 20s or 30s, you should have a clinical breast exam (CBE) by a health care provider every 1-3  years as part of a regular health exam.  If you are 45 or older, have a CBE every year. Also consider having a breast X-ray (mammogram) every year.  If you have a family history of breast cancer, talk to your health care provider about genetic screening.  If you are at high risk for breast cancer, talk to your health care provider about having an MRI and a mammogram every year.  Breast cancer gene (BRCA) assessment is recommended for women who have family members with BRCA-related cancers. BRCA-related cancers include:  Breast.  Ovarian.  Tubal.  Peritoneal cancers.  Results of the assessment will determine the need for genetic counseling and BRCA1 and BRCA2 testing. Cervical Cancer Routine pelvic examinations to screen for cervical cancer are no longer recommended for nonpregnant women who are considered low risk for cancer of the pelvic organs (ovaries, uterus, and vagina) and who do not have symptoms. A pelvic examination may be necessary if you have symptoms including those associated with pelvic infections. Ask your health care provider if a screening pelvic exam is right for you.   The Pap test is the screening test for cervical cancer for women who are considered at risk.  If you had a hysterectomy for a problem that was not cancer or a condition that could lead to cancer, then you no longer need Pap tests.  If you are older than 65 years, and you have had normal Pap tests for the past 10 years, you no longer need to have Pap tests.  If you have had past treatment for cervical cancer or a condition that could lead to cancer, you need Pap tests and screening for cancer for at least 20 years after your treatment.  If you no longer get a Pap test, assess your risk factors if they change (such as having a new sexual partner). This can affect whether you should start being screened again.  Some women have medical problems that increase their chance of getting cervical cancer. If  this is the case for you, your health care provider may recommend more frequent screening and Pap tests.  The human papillomavirus (HPV) test is another test that may be used for cervical cancer screening. The HPV test looks for the virus that can cause cell changes in the cervix. The cells collected during the Pap test can be tested for HPV.  The HPV test can be used to screen women 35 years of age and older. Getting tested for HPV can extend the interval between normal Pap tests from three to five years.  An HPV test also should be used to screen women of any age who have unclear Pap test results.  After 60 years of age, women should have HPV testing as often as Pap tests.  Colorectal Cancer  This type of cancer can be detected and often prevented.  Routine colorectal cancer screening usually begins at 60 years of age and continues through 60 years of age.  Your health care provider may recommend screening at an earlier age if you have risk factors for colon cancer.  Your health care provider may also recommend using home test kits to check for hidden blood in the stool.  A small camera at the end of a tube can be used to examine your colon directly (sigmoidoscopy or colonoscopy). This is done to check for the earliest forms of colorectal cancer.  Routine screening usually begins at age 25.  Direct examination of the colon should be repeated every 5-10 years through 60 years of age. However, you may need to be screened more often if early forms of precancerous polyps or small growths are found. Skin Cancer  Check your skin from head to toe regularly.  Tell your health care provider about any new moles or changes in moles, especially if there is a change in a mole's shape or color.  Also tell your health care provider if you have a mole that is larger than the size of a pencil eraser.  Always use sunscreen. Apply sunscreen liberally and repeatedly throughout the day.  Protect  yourself by wearing long sleeves, pants, a wide-brimmed hat, and sunglasses whenever you are outside. HEART DISEASE, DIABETES, AND HIGH BLOOD PRESSURE   Have your blood pressure checked at least every 1-2 years. High blood pressure causes heart disease and increases the risk of stroke.  If you are between 89 years and 68 years old, ask your health care provider if you should take aspirin to prevent strokes.  Have regular diabetes screenings. This involves taking a blood sample to check your fasting blood sugar level.  If you are at a normal weight and have a low risk for diabetes, have this test once every three years after 60 years of age.  If you are overweight and have a high risk for diabetes, consider being tested at a younger age or more often. PREVENTING INFECTION  Hepatitis B  If you have a higher risk for hepatitis B, you should be screened for this virus. You are considered at high risk for hepatitis B if:  You were born in a country where hepatitis B is common. Ask your health care provider which countries are considered high risk.  Your parents were born in a high-risk country, and you have not been immunized against hepatitis B (hepatitis B vaccine).  You have HIV or AIDS.  You use needles to inject street drugs.  You live with someone who has hepatitis B.  You have had sex with someone who has hepatitis B.  You get hemodialysis treatment.  You take certain medicines for conditions, including cancer, organ transplantation, and autoimmune conditions. Hepatitis C  Blood testing is recommended for:  Everyone born from [birth year] through 1966.  Anyone with known risk factors for hepatitis C. Sexually transmitted infections (STIs)  You should be screened for sexually transmitted infections (STIs) including gonorrhea and chlamydia if:  You are sexually active and are younger than 60 years of age.  You are older than 60 years of age and your health care provider tells you  that you are at risk for this type of infection.  Your sexual activity has changed since you were last screened and you are at an increased risk for chlamydia or gonorrhea. Ask your health care provider if you are at risk.  If you do not have HIV, but are  at risk, it may be recommended that you take a prescription medicine daily to prevent HIV infection. This is called pre-exposure prophylaxis (PrEP). You are considered at risk if:  You are sexually active and do not regularly use condoms or know the HIV status of your partner(s).  You take drugs by injection.  You are sexually active with a partner who has HIV. Talk with your health care provider about whether you are at high risk of being infected with HIV. If you choose to begin PrEP, you should first be tested for HIV. You should then be tested every 3 months for as long as you are taking PrEP.  PREGNANCY   If you are premenopausal and you may become pregnant, ask your health care provider about preconception counseling.  If you may become pregnant, take 400 to 800 micrograms (mcg) of folic acid every day.  If you want to prevent pregnancy, talk to your health care provider about birth control (contraception). OSTEOPOROSIS AND MENOPAUSE   Osteoporosis is a disease in which the bones lose minerals and strength with aging. This can result in serious bone fractures. Your risk for osteoporosis can be identified using a bone density scan.  If you are 68 years of age or older, or if you are at risk for osteoporosis and fractures, ask your health care provider if you should be screened.  Ask your health care provider whether you should take a calcium or vitamin D supplement to lower your risk for osteoporosis.  Menopause may have certain physical symptoms and risks.  Hormone replacement therapy may reduce some of these symptoms and risks. Talk to your health care provider about whether hormone replacement therapy is right for you.  HOME  CARE INSTRUCTIONS   Schedule regular health, dental, and eye exams.  Stay current with your immunizations.   Do not use any tobacco products including cigarettes, chewing tobacco, or electronic cigarettes.  If you are pregnant, do not drink alcohol.  If you are breastfeeding, limit how much and how often you drink alcohol.  Limit alcohol intake to no more than 1 drink per day for nonpregnant women. One drink equals 12 ounces of beer, 5 ounces of wine, or 1 ounces of hard liquor.  Do not use street drugs.  Do not share needles.  Ask your health care provider for help if you need support or information about quitting drugs.  Tell your health care provider if you often feel depressed.  Tell your health care provider if you have ever been abused or do not feel safe at home. Document Released: 07/08/2010 Document Revised: 05/09/2013 Document Reviewed: 11/24/2012 [facility] Patient Information 2015 [place], [place]. This information is not intended to replace advice given to you by your health care provider. Make sure you discuss any questions you have with your health care provider.

## 2014-01-31 NOTE — Progress Notes (Signed)
Patient ID: Kathleen Russell, female   DOB: 04/10/1955, 59 y.o.   MRN: [id number]  Subjective:     Kathleen Russell is a 59 y.o. female and is here for a comprehensive physical exam. The patient reports no problems.  ,  Last PAP normal 2013,  S.p oophreceotomy at age 54,  Last DEXA over 5 years ago at [facility].    Having recurrence of raynaud's this year.  No ulcerations   Ran out of vaginal estrogen, would like to resume it.    Left sided orthoepdic pains in hips knee and ankle .  Has been exercising 3-4 times per week with no difference.  Has had cortisone injections for bursitis .  Prior ankle trauama and sugery wit metal  doig some yoga .  Would like to resume meloxicam   History   Social History  . Marital Status: Married    Spouse Name: N/A    Number of Children: N/A  . Years of Education: N/A   Occupational History  . editor     [employer]   Social History Main Topics  . Smoking status: Never Smoker   . Smokeless tobacco: Never Used  . Alcohol Use: No  . Drug Use: No  . Sexual Activity: Yes    Birth Control/ Protection: Post-menopausal   Other Topics Concern  . Not on file   Social History Narrative   Health Maintenance  Topic Date Due  . COLONOSCOPY  03/25/2014  . INFLUENZA VACCINE  08/07/2014  . PAP SMEAR  10/29/2014  . MAMMOGRAM  10/26/2015  . TETANUS/TDAP  10/28/2021    The following portions of the patient's history were reviewed and updated as appropriate: allergies, current medications, past family history, past medical history, past social history, past surgical history and problem list.  Review of Systems  Patient denies headache, fevers, malaise, unintentional weight loss, skin rash, eye pain, sinus congestion and sinus pain, sore throat, dysphagia,  hemoptysis , cough, dyspnea, wheezing, chest pain, palpitations, orthopnea, edema, abdominal pain, nausea, melena, diarrhea, constipation, flank pain, dysuria, hematuria, urinary   Frequency, nocturia, numbness, tingling, seizures,  Focal weakness, Loss of consciousness,  Tremor, insomnia, depression, anxiety, and suicidal ideation.      Objective:   BP 112/78 mmHg  Pulse 84  Temp(Src) 98.3 F (36.8 C) (Oral)  Resp 12  Ht 5' 2.5" (1.588 m)  Wt 178 lb 8 oz (80.967 kg)  BMI 32.11 kg/m2  SpO2 99%  General appearance: alert, cooperative and appears stated age Head: Normocephalic, without obvious abnormality, atraumatic Eyes: conjunctivae/corneas clear. PERRL, EOM's intact. Fundi benign. Ears: normal TM's and external ear canals both ears Nose: Nares normal. Septum midline. Mucosa normal. No drainage or sinus tenderness. Throat: lips, mucosa, and tongue normal; teeth and gums normal Neck: no adenopathy, no carotid bruit, no JVD, supple, symmetrical, trachea midline and thyroid not enlarged, symmetric, no tenderness/mass/nodules Lungs: clear to auscultation bilaterally Breasts: normal appearance, no masses or tenderness Heart: regular rate and rhythm, S1, S2 normal, no murmur, click, rub or gallop Abdomen: soft, non-tender; bowel sounds normal; no masses,  no organomegaly Extremities: extremities normal, atraumatic, no cyanosis or edema Pulses: 2+ and symmetric Skin: Skin color, texture, turgor normal. No rashes or lesions Neurologic: Alert and oriented X 3, normal strength and tone. Normal symmetric reflexes. Normal coordination and gait.   .    Assessment and Plan:   Problem List Items Addressed This Visit    Bursitis of left hip    Prior  referral to [facility] for joint injection .  Apparently Dr Gorden Harms was injecting the mid lateral thigh.   Continue meloxicam        Obesity (BMI 30-39.9)    I have addressed  BMI and recommended a low glycemic index diet utilizing smaller more frequent meals to increase metabolism.  I have also recommended that patient start exercising with a goal of 30 minutes of aerobic exercise a minimum of 5 days per week.  Screening for lipid disorders, thyroid and diabetes to be done today.  . Lab Results  Component Value Date   TSH 4.09 11/05/2012   Lab Results  Component Value Date   HGBA1C 5.9 11/05/2012          Postmenopause atrophic vaginitis    Resuming vaginal estrogen      Relevant Medications   conjugated estrogens (PREMARIN) vaginal cream   Routine general medical examination at a health care facility - Primary    Annual wellness  exam was done as well as a comprehensive physical exam and management of acute and chronic conditions .  During the course of the visit the patient was educated and counseled about appropriate screening and preventive services including :  diabetes screening, lipid analysis with projected  10 year  risk for CAD , nutrition counseling, colorectal cancer screening, and recommended immunizations.  Printed recommendations for health maintenance screenings was given.

## 2014-01-31 NOTE — Progress Notes (Signed)
Pre visit review using our clinic review tool, if applicable. No additional management support is needed unless otherwise documented below in the visit note. 

## 2014-02-01 ENCOUNTER — Encounter: Payer: Self-pay | Admitting: Internal Medicine

## 2014-02-01 ENCOUNTER — Other Ambulatory Visit (INDEPENDENT_AMBULATORY_CARE_PROVIDER_SITE_OTHER): Payer: BLUE CROSS/BLUE SHIELD

## 2014-02-01 DIAGNOSIS — E785 Hyperlipidemia, unspecified: Secondary | ICD-10-CM

## 2014-02-01 DIAGNOSIS — N952 Postmenopausal atrophic vaginitis: Secondary | ICD-10-CM | POA: Insufficient documentation

## 2014-02-01 LAB — LIPID PANEL
Cholesterol: 200 mg/dL (ref 0–200)
HDL: 64.5 mg/dL (ref >39.00)
LDL CALC: 113 mg/dL — AB (ref 0–99)
NonHDL: 135.5
Total CHOL/HDL Ratio: 3
Triglycerides: 114 mg/dL (ref 0.0–149.0)
VLDL: 22.8 mg/dL (ref 0.0–40.0)

## 2014-02-01 LAB — COMPREHENSIVE METABOLIC PANEL
ALT: 16 U/L (ref 0–35)
AST: 17 U/L (ref 0–37)
Albumin: 3.9 g/dL (ref 3.5–5.2)
Alkaline Phosphatase: 56 U/L (ref 39–117)
BUN: 20 mg/dL (ref 6–23)
CHLORIDE: 102 meq/L (ref 96–112)
CO2: 30 meq/L (ref 19–32)
CREATININE: 0.72 mg/dL (ref 0.40–1.20)
Calcium: 9.3 mg/dL (ref 8.4–10.5)
GFR: 87.95 mL/min (ref >60.00)
GLUCOSE: 99 mg/dL (ref 70–99)
Potassium: 4.2 mEq/L (ref 3.5–5.1)
Sodium: 138 mEq/L (ref 135–145)
TOTAL PROTEIN: 6.6 g/dL (ref 6.0–8.3)
Total Bilirubin: 0.5 mg/dL (ref 0.2–1.2)

## 2014-02-01 NOTE — Assessment & Plan Note (Signed)
Resuming vaginal estrogen

## 2014-02-01 NOTE — Assessment & Plan Note (Signed)
I have addressed  BMI and recommended a low glycemic index diet utilizing smaller more frequent meals to increase metabolism.  I have also recommended that patient start exercising with a goal of 30 minutes of aerobic exercise a minimum of 5 days per week. Screening for lipid disorders, thyroid and diabetes to be done today.  . Lab Results  Component Value Date   TSH 4.09 11/05/2012   Lab Results  Component Value Date   HGBA1C 5.9 11/05/2012

## 2014-02-01 NOTE — Assessment & Plan Note (Signed)

## 2014-02-01 NOTE — Assessment & Plan Note (Signed)
Prior referral to [facility] for joint injection .  Apparently Dr Gorden Harms was injecting the mid lateral thigh.   Continue meloxicam

## 2014-02-02 ENCOUNTER — Encounter: Payer: Self-pay | Admitting: Internal Medicine

## 2014-02-06 ENCOUNTER — Other Ambulatory Visit: Payer: Self-pay | Admitting: Internal Medicine

## 2014-03-08 ENCOUNTER — Encounter: Payer: Self-pay | Admitting: Internal Medicine

## 2014-03-08 ENCOUNTER — Other Ambulatory Visit: Payer: Self-pay | Admitting: Internal Medicine

## 2014-03-08 MED ORDER — VALACYCLOVIR HCL 1 G PO TABS: 1000.0000 mg | ORAL_TABLET | Freq: Three times a day (TID) | ORAL | Status: DC

## 2014-05-30 ENCOUNTER — Ambulatory Visit
Admission: RE | Admit: 2014-05-30 | Discharge: 2014-05-30 | Disposition: A | Discharge status: Home / Self Care | Payer: BLUE CROSS/BLUE SHIELD | Source: Ambulatory Visit | Attending: Internal Medicine | Admitting: Internal Medicine

## 2014-05-30 ENCOUNTER — Encounter: Payer: Self-pay | Admitting: Internal Medicine

## 2014-05-30 ENCOUNTER — Ambulatory Visit (INDEPENDENT_AMBULATORY_CARE_PROVIDER_SITE_OTHER): Payer: BLUE CROSS/BLUE SHIELD | Admitting: Internal Medicine

## 2014-05-30 VITALS — BP 124/82 | HR 79 | Temp 99.0°F | Wt 190.0 lb

## 2014-05-30 DIAGNOSIS — R1012 Left upper quadrant pain: Secondary | ICD-10-CM | POA: Diagnosis not present

## 2014-05-30 DIAGNOSIS — R1032 Left lower quadrant pain: Secondary | ICD-10-CM

## 2014-05-30 DIAGNOSIS — N281 Cyst of kidney, acquired: Secondary | ICD-10-CM | POA: Insufficient documentation

## 2014-05-30 DIAGNOSIS — K8020 Calculus of gallbladder without cholecystitis without obstruction: Secondary | ICD-10-CM | POA: Insufficient documentation

## 2014-05-30 DIAGNOSIS — K5730 Diverticulosis of large intestine without perforation or abscess without bleeding: Secondary | ICD-10-CM | POA: Diagnosis not present

## 2014-05-30 DIAGNOSIS — R109 Unspecified abdominal pain: Secondary | ICD-10-CM

## 2014-05-30 LAB — POCT URINALYSIS DIPSTICK
Bilirubin, UA: NEGATIVE
Blood, UA: NEGATIVE
GLUCOSE UA: NEGATIVE
Ketones, UA: NEGATIVE
Leukocytes, UA: NEGATIVE
Nitrite, UA: NEGATIVE
PROTEIN UA: NEGATIVE
SPEC GRAV UA: 1.01
Urobilinogen, UA: NEGATIVE
pH, UA: 6.5

## 2014-05-30 MED ORDER — IOHEXOL 300 MG/ML  SOLN
100.0000 mL | Freq: Once | INTRAMUSCULAR | Status: AC | PRN
  Administered 2014-05-30: 100 mL via INTRAVENOUS

## 2014-05-30 NOTE — Addendum Note (Signed)
Addended by: Lurlean Nanny on: 05/30/2014 04:04 PM   Modules accepted: Orders

## 2014-05-30 NOTE — Progress Notes (Signed)
Pre visit review using our clinic review tool, if applicable. No additional management support is needed unless otherwise documented below in the visit note. 

## 2014-05-30 NOTE — Progress Notes (Signed)
Subjective:    Patient ID: Kathleen Russell, female    DOB: 1955-10-27, 59 y.o.   MRN: [id number]  HPI  Pt presents to the clinic today with c/o abdominal cramping, left low back pain and slight burning with urination. This started 2 days ago. It does not seem to be getting better or worse. She is having normal BM's, last one this am. She denies blood in her stool. She has had a little nausea but no vomiting. She denies vaginal discharge or bleeding. She denies fever, chills or body aches. She has had a total hysterectomy in the past. She has had a colonoscopy which did show diverticulosis but reports she has never had a flare. She has been eating a lot of nuts lately.  Review of Systems  Past Medical History  Diagnosis Date  . Chicken pox   . GERD (gastroesophageal reflux disease)   . Hx: UTI (urinary tract infection)   . Kidney infection   . Broken ankle   . allergic rhinitis   . History of broken nose   . Rhinitis, nonallergic   . Menopause, premature   . Raynaud's phenomenon     Current Outpatient Prescriptions  Medication Sig Dispense Refill  . Calcium-Vitamin D (CALTRATE 600 PLUS-VIT D PO) Take by mouth 2 (two) times daily.     . cetirizine (ZYRTEC) 10 MG tablet Take 10 mg by mouth daily.    Marland Kitchen conjugated estrogens (PREMARIN) vaginal cream Apply intravaginally three times per week 30 g 11  . cromolyn (NASALCROM) 5.2 MG/ACT nasal spray Place 1 spray into the nose at bedtime.    . Multiple Vitamin (MULTIVITAMIN) tablet Take 1 tablet by mouth daily.    Marland Kitchen NEXIUM 40 MG capsule TAKE ONE CAPSULE BY MOUTH DAILY BEFORE BREAKFAST 30 capsule 6  . valACYclovir (VALTREX) 1000 MG tablet Take 1 tablet (1,000 mg total) by mouth 3 (three) times daily. 21 tablet 1   No current facility-administered medications for this visit.    No Known Allergies  Family History  Problem Relation Age of Onset  . Arthritis Mother   . Diabetes Mother   . Hyperlipidemia Father   . Diabetes Father   .  Diabetes Sister   . Alcohol abuse Maternal Uncle   . Diabetes Paternal Aunt   . Cancer Paternal Uncle     lung  . Heart disease Paternal Uncle   . Hypertension Paternal Uncle   . Diabetes Paternal Uncle     History   Social History  . Marital Status: Married    Spouse Name: N/A  . Number of Children: N/A  . 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 Smoking status: Never Smoker   . Smokeless tobacco: Never Used  . Alcohol Use: No  . Drug Use: No  . Sexual Activity: Yes    Birth Control/ Protection: Post-menopausal   Other Topics Concern  . Not on file   Social History Narrative     Constitutional: Denies fever, malaise, fatigue, headache or abrupt weight changes.  Respiratory: Denies difficulty breathing, shortness of breath, cough or sputum production.   Cardiovascular: Denies chest pain, chest tightness, palpitations or swelling in the hands or feet.  Gastrointestinal: Pt reports abdominal cramping. Denies bloating, constipation, diarrhea or blood in the stool.  GU: Pt reports dysuria. Denies urgency, frequency, burning sensation, blood in urine, odor or discharge. Musculoskeletal: Pt reports  left lower back pain. Denies decrease in range of motion, difficulty with gait,  or joint pain and swelling.    No other specific complaints in a complete review of systems (except as listed in HPI above).      Objective:   Physical Exam  BP 124/82 mmHg  Pulse 79  Temp(Src) 99 F (37.2 C) (Oral)  Wt 190 lb (86.183 kg)  SpO2 98% Wt Readings from Last 3 Encounters:  05/30/14 190 lb (86.183 kg)  01/31/14 178 lb 8 oz (80.967 kg)  11/05/12 193 lb 8 oz (87.771 kg)    General: Appears her stated age, well developed, well nourished in NAD. Cardiovascular: Normal rate and rhythm. S1,S2 noted.  No murmur, rubs or gallops noted.  Pulmonary/Chest: Normal effort and positive vesicular breath  sounds. No respiratory distress. No wheezes, rales or ronchi noted.  Abdomen: Soft, tender in the LUQ/LLQ. Hyperactive bowel sounds, no bruits noted. No distention or masses noted. No CVA tenderness noted on the left. Neurological: Alert and oriented.    BMET    Component Value Date/Time   NA 138 02/01/2014 0902   K 4.2 02/01/2014 0902   CL 102 02/01/2014 0902   CO2 30 02/01/2014 0902   GLUCOSE 99 02/01/2014 0902   BUN 20 02/01/2014 0902   CREATININE 0.72 02/01/2014 0902   CALCIUM 9.3 02/01/2014 0902    Lipid Panel     Component Value Date/Time   CHOL 200 02/01/2014 0902   TRIG 114.0 02/01/2014 0902   HDL 64.50 02/01/2014 0902   CHOLHDL 3 02/01/2014 0902   VLDL 22.8 02/01/2014 0902   LDLCALC 113* 02/01/2014 0902    CBC    Component Value Date/Time   WBC 5.9 08/02/2012 0937   RBC 4.96 08/02/2012 0937   HGB 14.3 08/02/2012 0937   HCT 42.7 08/02/2012 0937   PLT 227.0 08/02/2012 0937   MCV 86.2 08/02/2012 0937   MCHC 33.5 08/02/2012 0937   RDW 13.8 08/02/2012 0937   LYMPHSABS 1.6 08/02/2012 0937   MONOABS 0.7 08/02/2012 0937   EOSABS 0.1 08/02/2012 0937   BASOSABS 0.0 08/02/2012 0937    Hgb A1C Lab Results  Component Value Date   HGBA1C 5.9 11/05/2012         Assessment & Plan:   LLQ/LUQ abdominal pain:  Urinalysis: normal Concerning for diverticulitis flare Will obtain STAT CT scan abdomen  Will follow up after CT scan, if symptoms worsen, go straight to ER

## 2014-05-30 NOTE — Patient Instructions (Signed)

## 2014-06-01 ENCOUNTER — Telehealth: Payer: Self-pay | Admitting: Internal Medicine

## 2014-06-01 NOTE — Telephone Encounter (Signed)
Patient Name: Kathleen Russell DOB: [birth year] Initial Comment Caller states, has a low grade fever and diarrhea since Monday , temp 99, she has lower ABD pain Nurse Assessment Nurse: Marcelline Deist, RN, Kermit Balo Date/Time (Eastern Time): 06/01/2014 10:06:25 AM Confirm and document reason for call. If symptomatic, describe symptoms. ---Caller states she has had a low grade fever and frequent watery diarrhea since Sunday/Monday. Her temp. is 99 - 99.5, she has lower abdominal pain & cramping. Nausea, no vomiting. Saw NP at [facility] office, had a CT scan - negative for diverticulitis. The first couple days, there was a scant amt. of [place] red blood. Diarrhea worsened after taking contrast fluid. Has the patient traveled out of the country within the last 30 days? ---Not Applicable Does the patient require triage? ---Yes Related visit to physician within the last 2 weeks? ---No Does the PT have any chronic conditions? (i.e. diabetes, asthma, etc.) ---No Guidelines Guideline Title Affirmed Question Affirmed Notes Diarrhea Fever present > 3 days (72 hours) Final Disposition User See Physician within [place], Therapist, sports, [place]

## 2014-06-01 NOTE — Telephone Encounter (Signed)
FYI, pt has appoint with you on 5.27.16.

## 2014-06-02 ENCOUNTER — Encounter: Payer: Self-pay | Admitting: Nurse Practitioner

## 2014-06-02 ENCOUNTER — Ambulatory Visit (INDEPENDENT_AMBULATORY_CARE_PROVIDER_SITE_OTHER): Payer: BLUE CROSS/BLUE SHIELD | Admitting: Nurse Practitioner

## 2014-06-02 VITALS — BP 112/74 | HR 97 | Temp 98.7°F | Resp 16 | Ht 62.5 in | Wt 187.1 lb

## 2014-06-02 DIAGNOSIS — K5730 Diverticulosis of large intestine without perforation or abscess without bleeding: Secondary | ICD-10-CM | POA: Diagnosis not present

## 2014-06-02 NOTE — Patient Instructions (Signed)
New research shows no particular foods to avoid, eating high fiber foods like beans, veggies, lots of water  Diverticulosis Diverticulosis is the condition that develops when small pouches (diverticula) form in the wall of your colon. Your colon, or large intestine, is where water is absorbed and stool is formed. The pouches form when the inside layer of your colon pushes through weak spots in the outer layers of your colon. CAUSES  No one knows exactly what causes diverticulosis. RISK FACTORS  Being older than 69. Your risk for this condition increases with age. Diverticulosis is rare in people younger than 40 years. By age 43, almost everyone has it.  Eating a low-fiber diet.  Being frequently constipated.  Being overweight.  Not getting enough exercise.  Smoking.  Taking over-the-counter pain medicines, like aspirin and ibuprofen. SYMPTOMS  Most people with diverticulosis do not have symptoms. DIAGNOSIS  Because diverticulosis often has no symptoms, health care providers often discover the condition during an exam for other colon problems. In many cases, a health care provider will diagnose diverticulosis while using a flexible scope to examine the colon (colonoscopy). TREATMENT  If you have never developed an infection related to diverticulosis, you may not need treatment. If you have had an infection before, treatment may include:  Eating more fruits, vegetables, and grains.  Taking a fiber supplement.  Taking a live bacteria supplement (probiotic).  Taking medicine to relax your colon. HOME CARE INSTRUCTIONS   Drink at least 6-8 glasses of water each day to prevent constipation.  Try not to strain when you have a bowel movement.  Keep all follow-up appointments. If you have had an infection before:  Increase the fiber in your diet as directed by your health care provider or dietitian.  Take a dietary fiber supplement if your health care provider approves.  Only  take medicines as directed by your health care provider. SEEK MEDICAL CARE IF:   You have abdominal pain.  You have bloating.  You have cramps.  You have not gone to the bathroom in 3 days. SEEK IMMEDIATE MEDICAL CARE IF:   Your pain gets worse.  Yourbloating becomes very bad.  You have a fever or chills, and your symptoms suddenly get worse.  You begin vomiting.  You have bowel movements that are bloody or black. MAKE SURE YOU:  Understand these instructions.  Will watch your condition.  Will get help right away if you are not doing well or get worse. Document Released: 09/20/2003 Document Revised: 12/28/2012 Document Reviewed: 11/17/2012 [facility] Patient Information 2015 [place], [place]. This information is not intended to replace advice given to you by your health care provider. Make sure you discuss any questions you have with your health care provider.

## 2014-06-02 NOTE — Progress Notes (Signed)
Pre visit review using our clinic review tool, if applicable. No additional management support is needed unless otherwise documented below in the visit note. 

## 2014-06-02 NOTE — Assessment & Plan Note (Signed)
CT scan of abdomen on 5/24 revealed diverticulosis w/out diverticulitis. Pt was given hand out with information and she is feeling improved today. Encouraged high fiber diet and lots of water. FU prn worsening/failure to improve.

## 2014-06-02 NOTE — Progress Notes (Signed)
   Subjective:    Patient ID: Rudell Cobb, female    DOB: 1955/12/12, 59 y.o.   MRN: [id number]  HPI  Ms. Mohar is a 59 yo female with a CC of diarrhea x 5 days.   1) Diarrhea since Monday and lower abdominal pain   Saw [facility] on 5/24 and went for CT scan  Stopped yesterday with diarrhea after noon time Not as bloated today, feeling a little better, some discomfort   Drinking water or tea, coffee this morning  Plain pasta for lunch  Chicken pie for dinner   Review of Systems  Constitutional: Negative for fever, chills, diaphoresis and fatigue.  Eyes: Negative for visual disturbance.  Gastrointestinal: Positive for abdominal pain and diarrhea. Negative for nausea, vomiting, constipation and abdominal distention.       Generalized abdominal tenderness, RLQ more tender today  Skin: Negative for rash.  Neurological: Negative for dizziness, light-headedness and headaches.      Objective:   Physical Exam  Constitutional: She is oriented to person, place, and time. She appears well-developed and well-nourished. No distress.  BP 112/74 mmHg  Pulse 97  Temp(Src) 98.7 F (37.1 C)  Resp 16  Ht 5' 2.5" (1.588 m)  Wt 187 lb 1.9 oz (84.877 kg)  BMI 33.66 kg/m2  SpO2 94%   HENT:  Head: Normocephalic and atraumatic.  Right Ear: External ear normal.  Left Ear: External ear normal.  Eyes: EOM are normal. Pupils are equal, round, and reactive to light. Right eye exhibits no discharge. Left eye exhibits no discharge. No scleral icterus.  Cardiovascular: Normal rate, regular rhythm, normal heart sounds and intact distal pulses.  Exam reveals no gallop and no friction rub.   No murmur heard. Pulmonary/Chest: Effort normal and breath sounds normal. No respiratory distress. She has no wheezes. She has no rales. She exhibits no tenderness.  Abdominal: Soft. Bowel sounds are normal. She exhibits no distension and no mass. There is tenderness. There is no rebound and no guarding.    Tender all over abdomen, slightly more tender in RLQ area  Neurological: She is alert and oriented to person, place, and time. No cranial nerve deficit. She exhibits normal muscle tone. Coordination normal.  Skin: Skin is warm and dry. No rash noted. She is not diaphoretic.  Psychiatric: She has a normal mood and affect. Her behavior is normal. Judgment and thought content normal.      Assessment & Plan:

## 2014-06-06 ENCOUNTER — Ambulatory Visit: Payer: BLUE CROSS/BLUE SHIELD

## 2014-08-21 ENCOUNTER — Other Ambulatory Visit: Payer: Self-pay | Admitting: Internal Medicine

## 2014-10-23 ENCOUNTER — Other Ambulatory Visit: Payer: Self-pay | Admitting: Internal Medicine

## 2014-11-02 ENCOUNTER — Other Ambulatory Visit: Payer: Self-pay | Admitting: Internal Medicine

## 2014-11-02 DIAGNOSIS — Z1231 Encounter for screening mammogram for malignant neoplasm of breast: Secondary | ICD-10-CM

## 2014-11-07 ENCOUNTER — Ambulatory Visit
Admission: RE | Admit: 2014-11-07 | Discharge: 2014-11-07 | Disposition: A | Discharge status: Home / Self Care | Payer: BLUE CROSS/BLUE SHIELD | Source: Ambulatory Visit | Attending: Internal Medicine | Admitting: Internal Medicine

## 2014-11-07 DIAGNOSIS — R922 Inconclusive mammogram: Secondary | ICD-10-CM | POA: Insufficient documentation

## 2014-11-07 DIAGNOSIS — R928 Other abnormal and inconclusive findings on diagnostic imaging of breast: Secondary | ICD-10-CM

## 2014-11-07 DIAGNOSIS — Z1231 Encounter for screening mammogram for malignant neoplasm of breast: Secondary | ICD-10-CM | POA: Insufficient documentation

## 2014-11-08 DIAGNOSIS — R928 Other abnormal and inconclusive findings on diagnostic imaging of breast: Secondary | ICD-10-CM | POA: Insufficient documentation

## 2014-11-09 ENCOUNTER — Other Ambulatory Visit: Payer: Self-pay | Admitting: Internal Medicine

## 2014-11-09 DIAGNOSIS — R928 Other abnormal and inconclusive findings on diagnostic imaging of breast: Secondary | ICD-10-CM

## 2014-11-10 ENCOUNTER — Ambulatory Visit
Admission: RE | Admit: 2014-11-10 | Discharge: 2014-11-10 | Disposition: A | Discharge status: Home / Self Care | Payer: BLUE CROSS/BLUE SHIELD | Source: Ambulatory Visit | Attending: Internal Medicine | Admitting: Internal Medicine

## 2014-11-10 DIAGNOSIS — R928 Other abnormal and inconclusive findings on diagnostic imaging of breast: Secondary | ICD-10-CM

## 2014-12-06 ENCOUNTER — Encounter: Payer: Self-pay | Admitting: Internal Medicine

## 2015-02-02 ENCOUNTER — Ambulatory Visit (INDEPENDENT_AMBULATORY_CARE_PROVIDER_SITE_OTHER): Payer: BLUE CROSS/BLUE SHIELD | Admitting: Internal Medicine

## 2015-02-02 VITALS — BP 126/84 | HR 79 | Temp 97.6°F | Resp 12 | Ht 62.5 in | Wt 190.5 lb

## 2015-02-02 DIAGNOSIS — J0110 Acute frontal sinusitis, unspecified: Secondary | ICD-10-CM

## 2015-02-02 DIAGNOSIS — E559 Vitamin D deficiency, unspecified: Secondary | ICD-10-CM | POA: Diagnosis not present

## 2015-02-02 DIAGNOSIS — R5383 Other fatigue: Secondary | ICD-10-CM

## 2015-02-02 DIAGNOSIS — L578 Other skin changes due to chronic exposure to nonionizing radiation: Secondary | ICD-10-CM | POA: Diagnosis not present

## 2015-02-02 DIAGNOSIS — Z0000 Encounter for general adult medical examination without abnormal findings: Secondary | ICD-10-CM

## 2015-02-02 DIAGNOSIS — E785 Hyperlipidemia, unspecified: Secondary | ICD-10-CM

## 2015-02-02 DIAGNOSIS — Z124 Encounter for screening for malignant neoplasm of cervix: Secondary | ICD-10-CM | POA: Diagnosis not present

## 2015-02-02 DIAGNOSIS — Z1159 Encounter for screening for other viral diseases: Secondary | ICD-10-CM

## 2015-02-02 LAB — COMPREHENSIVE METABOLIC PANEL
ALT: 16 U/L (ref 0–35)
AST: 20 U/L (ref 0–37)
Albumin: 4.4 g/dL (ref 3.5–5.2)
Alkaline Phosphatase: 72 U/L (ref 39–117)
BILIRUBIN TOTAL: 0.3 mg/dL (ref 0.2–1.2)
BUN: 18 mg/dL (ref 6–23)
CHLORIDE: 103 meq/L (ref 96–112)
CO2: 29 meq/L (ref 19–32)
CREATININE: 0.78 mg/dL (ref 0.40–1.20)
Calcium: 9.4 mg/dL (ref 8.4–10.5)
GFR: 79.92 mL/min (ref >60.00)
GLUCOSE: 110 mg/dL — AB (ref 70–99)
Potassium: 4.2 mEq/L (ref 3.5–5.1)
SODIUM: 138 meq/L (ref 135–145)
Total Protein: 7.4 g/dL (ref 6.0–8.3)

## 2015-02-02 LAB — CBC WITH DIFFERENTIAL/PLATELET
BASOS PCT: 0.8 % (ref 0.0–3.0)
Basophils Absolute: 0 10*3/uL (ref 0.0–0.1)
EOS ABS: 0.1 10*3/uL (ref 0.0–0.7)
Eosinophils Relative: 1.8 % (ref 0.0–5.0)
HCT: 42.3 % (ref 36.0–46.0)
HEMOGLOBIN: 13.9 g/dL (ref 12.0–15.0)
LYMPHS ABS: 2.3 10*3/uL (ref 0.7–4.0)
Lymphocytes Relative: 39.3 % (ref 12.0–46.0)
MCHC: 32.8 g/dL (ref 30.0–36.0)
MCV: 85.7 fl (ref 78.0–100.0)
MONO ABS: 0.3 10*3/uL (ref 0.1–1.0)
Monocytes Relative: 5 % (ref 3.0–12.0)
NEUTROS ABS: 3.1 10*3/uL (ref 1.4–7.7)
Neutrophils Relative %: 53.1 % (ref 43.0–77.0)
PLATELETS: 229 10*3/uL (ref 150.0–400.0)
RBC: 4.94 Mil/uL (ref 3.87–5.11)
RDW: 13.5 % (ref 11.5–15.5)
WBC: 5.9 10*3/uL (ref 4.0–10.5)

## 2015-02-02 LAB — LIPID PANEL
CHOLESTEROL: 206 mg/dL — AB (ref 0–200)
HDL: 58.5 mg/dL (ref >39.00)
LDL CALC: 112 mg/dL — AB (ref 0–99)
NonHDL: 147.03
Total CHOL/HDL Ratio: 4
Triglycerides: 175 mg/dL — ABNORMAL HIGH (ref 0.0–149.0)
VLDL: 35 mg/dL (ref 0.0–40.0)

## 2015-02-02 LAB — LDL CHOLESTEROL, DIRECT: LDL DIRECT: 123 mg/dL

## 2015-02-02 MED ORDER — BENZONATATE 200 MG PO CAPS: 200.0000 mg | ORAL_CAPSULE | Freq: Three times a day (TID) | ORAL | Status: DC | PRN

## 2015-02-02 MED ORDER — HYDROCOD POLST-CPM POLST ER 10-8 MG/5ML PO SUER: 5.0000 mL | Freq: Every evening | ORAL | Status: DC | PRN

## 2015-02-02 MED ORDER — PREDNISONE 10 MG PO TABS: ORAL_TABLET | ORAL | Status: DC

## 2015-02-02 MED ORDER — AMOXICILLIN-POT CLAVULANATE 875-125 MG PO TABS: 1.0000 | ORAL_TABLET | Freq: Two times a day (BID) | ORAL | Status: DC

## 2015-02-02 NOTE — Progress Notes (Signed)
Pre-visit discussion using our clinic review tool. No additional management support is needed unless otherwise documented below in the visit note.  

## 2015-02-02 NOTE — Progress Notes (Signed)
Patient ID: Kathleen Russell, female    DOB: 10/14/1955  Age: 60 y.o. MRN: [id number]  The patient is here for annual  wellness examination and management of other chronic and acute problems.   PAP normal 2013 Mammogram abnormal  Nov 2016 [facility] not covering the diagnostic she had  Last Colonoscopy 2006. Wants to do a cologuard History of laser surgery left eye, had not had exam in 3 years.  Annual  dermatology needed; last visit at [place] was with Nehemiah Massed;  wants to see Dasher   The risk factors are reflected in the social history.  The roster of all physicians providing medical care to patient - is listed in the Snapshot section of the chart.  Activities of daily living:  The patient is 100% independent in all ADLs: dressing, toileting, feeding as well as independent mobility  Home safety : The patient has smoke detectors in the home. They wear seatbelts.  There are no firearms at home. There is no violence in the home.   There is no risks for hepatitis, STDs or HIV. There is no   history of blood transfusion. They have no travel history to infectious disease endemic areas of the world.  The patient has seen their dentist in the last six month. They have seen their eye doctor in the last year. They admit to slight hearing difficulty with regard to whispered voices and some television programs.  They have deferred audiologic testing in the last year.  They do not  have excessive sun exposure. Discussed the need for sun protection: hats, long sleeves and use of sunscreen if there is significant sun exposure.   Diet: the importance of a healthy diet is discussed. They do have a healthy diet.  The benefits of regular aerobic exercise were discussed. She walks 4 times per week ,  20 minutes.   Depression screen: there are no signs or vegative symptoms of depression- irritability, change in appetite, anhedonia, sadness/tearfullness.  Cognitive assessment: the patient manages all  their financial and personal affairs and is actively engaged. They could relate day,date,year and events; recalled 2/3 objects at 3 minutes; performed clock-face test normally.  The following portions of the patient's history were reviewed and updated as appropriate: allergies, current medications, past family history, past medical history,  past surgical history, past social history  and problem list.  Visual acuity was not assessed per patient preference since she has regular follow up with her ophthalmologist. Hearing and body mass index were assessed and reviewed.   During the course of the visit the patient was educated and counseled about appropriate screening and preventive services including : fall prevention , diabetes screening, nutrition counseling, colorectal cancer screening, and recommended immunizations.    CC: The primary encounter diagnosis was Screening for cervical cancer. Diagnoses of Sun-damaged skin, Hyperlipidemia, Other fatigue, Need for hepatitis C screening test, Vitamin D deficiency, Visit for preventive health examination, Acute frontal sinusitis, recurrence not specified, and Hyperlipidemia LDL goal <160 were also pertinent to this visit.   persistent URI. Treated dec 20 by Anda Latina for sinusitis with cipro  x10 days  and topical antibiotic for sinuses, and advised to use a sinus flush sinusitis resolved (headaches and facial pain).  But at end of December started having persistent cough and now the congestion has recurred, doesn't feel as bad as last time.  Monday had sore throat from drainage  Started using mucinex 2 days ago.  No flu symptoms but cough is much  worse at night   Was treated in September for sinusitis with a different antibiotic not sure what one   Had a lot of family drama that occurred over the holidays (elderly mothers with fractures, son sprang a a marriage on the with < 4 weeks notice )     History Kaiden has a past medical history of  Chicken pox; GERD (gastroesophageal reflux disease); UTI (urinary tract infection); Kidney infection; Broken ankle; allergic rhinitis; History of broken nose; Rhinitis, nonallergic; Menopause, premature; and Raynaud's phenomenon.   She has past surgical history that includes Tonsillectomy and adenoidectomy; Abdominal hysterectomy; Eye surgery; Cesarean section; Total abdominal hysterectomy w/ bilateral salpingoophorectomy (2000); and ORIF ankle fracture (1980).   Her family history includes Alcohol abuse in her maternal uncle; Arthritis in her mother; Cancer in her paternal uncle; Diabetes in her father, mother, paternal aunt, paternal uncle, and sister; Heart disease in her paternal uncle; Hyperlipidemia in her father; Hypertension in her paternal uncle.She reports that she has never smoked. She has never used smokeless tobacco. She reports that she does not drink alcohol or use illicit drugs.  Outpatient Prescriptions Prior to Visit  Medication Sig Dispense Refill  . cetirizine (ZYRTEC) 10 MG tablet Take 10 mg by mouth daily.    Marland Kitchen conjugated estrogens (PREMARIN) vaginal cream Apply intravaginally three times per week 30 g 11  . Multiple Vitamin (MULTIVITAMIN) tablet Take 1 tablet by mouth daily.    Marland Kitchen NEXIUM 40 MG capsule TAKE ONE CAPSULE BY MOUTH DAILY BEFORE BREAKFAST 30 capsule 3  . valACYclovir (VALTREX) 1000 MG tablet Take 1 tablet (1,000 mg total) by mouth 3 (three) times daily. 21 tablet 1  . Calcium-Vitamin D (CALTRATE 600 PLUS-VIT D PO) Take by mouth 2 (two) times daily. Reported on 02/02/2015    . cromolyn (NASALCROM) 5.2 MG/ACT nasal spray Place 1 spray into the nose at bedtime.     No facility-administered medications prior to visit.    Review of Systems   Patient denies headache, fevers, malaise, unintentional weight loss, skin rash, eye pain, sinus congestion and sinus pain, sore throat, dysphagia,  hemoptysis , cough, dyspnea, wheezing, chest pain, palpitations, orthopnea, edema,  abdominal pain, nausea, melena, diarrhea, constipation, flank pain, dysuria, hematuria, urinary  Frequency, nocturia, numbness, tingling, seizures,  Focal weakness, Loss of consciousness,  Tremor, insomnia, depression, anxiety, and suicidal ideation.      Objective:  BP 126/84 mmHg  Pulse 79  Temp(Src) 97.6 F (36.4 C) (Oral)  Resp 12  Ht 5' 2.5" (1.588 m)  Wt 190 lb 8 oz (86.41 kg)  BMI 34.27 kg/m2  SpO2 98%  Physical Exam   General appearance: alert, cooperative and appears stated age Head: Normocephalic, without obvious abnormality, atraumatic Eyes: conjunctivae/corneas clear. PERRL, EOM's intact. Fundi benign. Ears: normal TM's and external ear canals both ears Nose: Nares normal. Septum midline. Mucosa normal. No drainage or sinus tenderness. Throat: lips, mucosa, and tongue normal; teeth and gums normal Neck: no adenopathy, no carotid bruit, no JVD, supple, symmetrical, trachea midline and thyroid not enlarged, symmetric, no tenderness/mass/nodules Lungs: clear to auscultation bilaterally Breasts: normal appearance, no masses or tenderness Heart: regular rate and rhythm, S1, S2 normal, no murmur, click, rub or gallop Abdomen: soft, non-tender; bowel sounds normal; no masses,  no organomegaly Extremities: extremities normal, atraumatic, no cyanosis or edema Pulses: 2+ and symmetric Skin: Skin color, texture, turgor normal. No rashes or lesions Neurologic: Alert and oriented X 3, normal strength and tone. Normal symmetric reflexes. Normal coordination and gait.  Assessment & Plan:   Problem List Items Addressed This Visit    Visit for preventive health examination    Annual comprehensive preventive exam was done as well as an evaluation and management of chronic conditions .  During the course of the visit the patient was educated and counseled about appropriate screening and preventive services including :  diabetes screening, lipid analysis with projected  10 year   risk for CAD, nutrition counseling, colorectal cancer screening, and recommended immunizations.  Printed recommendations for health maintenance screenings was given.       Hyperlipidemia LDL goal <160     Her 10 yr risk of CAD was  7.13% i n January 2016   .  She will return for repeat  lipids .  Lab Results  Component Value Date   CHOL 200 02/01/2014   HDL 64.50 02/01/2014   LDLCALC 113* 02/01/2014   LDLDIRECT 159.1 11/05/2012   TRIG 114.0 02/01/2014   CHOLHDL 3 02/01/2014         Acute sinusitis    Given chronicity of symptoms, development of facial pain and exam consistent with bacterial URI,  Will treat with empiric antibiotics, prednisone taper, decongestants, and saline lavage.        Relevant Medications   fluticasone (FLONASE) 50 MCG/ACT nasal spray   dextromethorphan-guaiFENesin (MUCINEX DM) 30-600 MG 12hr tablet   chlorpheniramine-HYDROcodone (TUSSIONEX PENNKINETIC ER) 10-8 MG/5ML SUER   benzonatate (TESSALON) 200 MG capsule   predniSONE (DELTASONE) 10 MG tablet   amoxicillin-clavulanate (AUGMENTIN) 875-125 MG tablet   Screening for cervical cancer - Primary    PAP  Smear normal 2013.  supracervical hysterectomy       Other Visit Diagnoses    Sun-damaged skin        Relevant Orders    Ambulatory referral to Dermatology    Hyperlipidemia        Relevant Orders    Hepatitis C antibody (Completed)    Lipid panel    LDL cholesterol, direct    Other fatigue        Relevant Orders    Comprehensive metabolic panel    CBC with Differential/Platelet    Need for hepatitis C screening test        Relevant Orders    Hepatitis C antibody (Completed)    Vitamin D deficiency        Relevant Orders    VITAMIN D 25 Hydroxy (Vit-D Deficiency, Fractures)       I have discontinued Ms. Casamento's Calcium-Vitamin D (CALTRATE 600 PLUS-VIT D PO) and cromolyn. I am also having her start on chlorpheniramine-HYDROcodone, benzonatate, predniSONE, and amoxicillin-clavulanate.  Additionally, I am having her maintain her cetirizine, multivitamin, conjugated estrogens, valACYclovir, NEXIUM, fluticasone, and dextromethorphan-guaiFENesin.  Meds ordered this encounter  Medications  . fluticasone (FLONASE) 50 MCG/ACT nasal spray    Sig: Place 2 sprays into both nostrils at bedtime as needed.    Refill:  10  . dextromethorphan-guaiFENesin (MUCINEX DM) 30-600 MG 12hr tablet    Sig: Take 1 tablet by mouth 2 (two) times daily.  . chlorpheniramine-HYDROcodone (TUSSIONEX PENNKINETIC ER) 10-8 MG/5ML SUER    Sig: Take 5 mLs by mouth at bedtime as needed for cough.    Dispense:  140 mL    Refill:  0  . benzonatate (TESSALON) 200 MG capsule    Sig: Take 1 capsule (200 mg total) by mouth 3 (three) times daily as needed for cough.    Dispense:  60 capsule    Refill:  0  . predniSONE (DELTASONE) 10 MG tablet    Sig: 6 tablets on Day 1 , then reduce by 1 tablet daily until gone    Dispense:  21 tablet    Refill:  0  . amoxicillin-clavulanate (AUGMENTIN) 875-125 MG tablet    Sig: Take 1 tablet by mouth 2 (two) times daily.    Dispense:  14 tablet    Refill:  0    Medications Discontinued During This Encounter  Medication Reason  . Calcium-Vitamin D (CALTRATE 600 PLUS-VIT D PO) Patient Preference  . cromolyn (NASALCROM) 5.2 MG/ACT nasal spray Change in therapy    Follow-up: No Follow-up on file.   Crecencio Mc, MD

## 2015-02-02 NOTE — Assessment & Plan Note (Signed)
PAP  Smear normal 2013.  supracervical hysterectomy

## 2015-02-02 NOTE — Patient Instructions (Addendum)
iam treating you for bacterial sinusitis which is probably a  complication from your allergies causing  persistent sinus congestion.   I am prescribing  a prednisone  To manage the infection and the inflammation in your ear/sinuses.   I also advise use of the following OTC meds to help with your other symptoms.   flush your sinuses twice daily with NeilMed sinus rinse  (do over the sink because if you do it right you will spit out globs of mucus)  Use  Tussionex for nighttime cough Use benzonatate capsules   FOR THE  Daytime COUGH.    Please take a probiotic ( Align, Flora que or Culturelle) OR A GENERIC EQUIVALENT for three weeks since you are taking an  antibiotic to prevent a very serious antibiotic associated infection  Called clostridium dificile colitis that can cause diarrhea, multi organ failure, sepsis and death if not managed.

## 2015-02-02 NOTE — Progress Notes (Signed)
Cologuard ordered

## 2015-02-03 LAB — HEPATITIS C ANTIBODY: HCV Ab: NEGATIVE

## 2015-02-04 ENCOUNTER — Encounter: Payer: Self-pay | Admitting: Internal Medicine

## 2015-02-04 DIAGNOSIS — J019 Acute sinusitis, unspecified: Secondary | ICD-10-CM | POA: Insufficient documentation

## 2015-02-04 NOTE — Assessment & Plan Note (Signed)
Given chronicity of symptoms, development of facial pain and exam consistent with bacterial URI,  Will treat with empiric antibiotics, prednisone taper, decongestants, and saline lavage.    

## 2015-02-04 NOTE — Assessment & Plan Note (Signed)
Her 10 yr risk of CAD was  7.13% i n January 2016   .  She will return for repeat  lipids .  Lab Results  Component Value Date   CHOL 200 02/01/2014   HDL 64.50 02/01/2014   LDLCALC 113* 02/01/2014   LDLDIRECT 159.1 11/05/2012   TRIG 114.0 02/01/2014   CHOLHDL 3 02/01/2014

## 2015-02-04 NOTE — Assessment & Plan Note (Signed)
Annual comprehensive preventive exam was done as well as an evaluation and management of chronic conditions .  During the course of the visit the patient was educated and counseled about appropriate screening and preventive services including :  diabetes screening, lipid analysis with projected  10 year  risk for CAD , nutrition counseling, colorectal cancer screening, and recommended immunizations.  Printed recommendations for health maintenance screenings was given.   

## 2015-02-05 LAB — VITAMIN D 25 HYDROXY (VIT D DEFICIENCY, FRACTURES): VITD: 51.37 ng/mL (ref 30.00–100.00)

## 2015-02-06 ENCOUNTER — Encounter: Payer: Self-pay | Admitting: Internal Medicine

## 2015-03-01 LAB — COLOGUARD: Cologuard: NEGATIVE

## 2015-03-05 ENCOUNTER — Encounter: Payer: Self-pay | Admitting: Internal Medicine

## 2015-03-08 ENCOUNTER — Telehealth: Payer: Self-pay | Admitting: Internal Medicine

## 2015-03-08 NOTE — Telephone Encounter (Signed)
MyChart message sent  Re cologuard

## 2015-03-21 ENCOUNTER — Other Ambulatory Visit: Payer: Self-pay | Admitting: Internal Medicine

## 2015-05-17 DIAGNOSIS — D485 Neoplasm of uncertain behavior of skin: Secondary | ICD-10-CM | POA: Diagnosis not present

## 2015-05-17 DIAGNOSIS — C44619 Basal cell carcinoma of skin of left upper limb, including shoulder: Secondary | ICD-10-CM | POA: Diagnosis not present

## 2015-05-17 DIAGNOSIS — D225 Melanocytic nevi of trunk: Secondary | ICD-10-CM | POA: Diagnosis not present

## 2015-05-17 DIAGNOSIS — L570 Actinic keratosis: Secondary | ICD-10-CM | POA: Diagnosis not present

## 2015-05-31 DIAGNOSIS — M3501 Sicca syndrome with keratoconjunctivitis: Secondary | ICD-10-CM | POA: Diagnosis not present

## 2015-06-21 DIAGNOSIS — C44519 Basal cell carcinoma of skin of other part of trunk: Secondary | ICD-10-CM | POA: Diagnosis not present

## 2015-08-15 ENCOUNTER — Other Ambulatory Visit: Payer: Self-pay | Admitting: Internal Medicine

## 2015-10-11 DIAGNOSIS — J019 Acute sinusitis, unspecified: Secondary | ICD-10-CM | POA: Diagnosis not present

## 2015-10-11 DIAGNOSIS — Z23 Encounter for immunization: Secondary | ICD-10-CM | POA: Diagnosis not present

## 2015-10-17 ENCOUNTER — Other Ambulatory Visit: Payer: Self-pay | Admitting: Internal Medicine

## 2015-10-17 NOTE — Telephone Encounter (Signed)
Refilled 03/21/15. Pt last seen 02/02/15. Please advise?

## 2015-10-25 ENCOUNTER — Other Ambulatory Visit: Payer: Self-pay | Admitting: Internal Medicine

## 2015-10-25 DIAGNOSIS — Z85828 Personal history of other malignant neoplasm of skin: Secondary | ICD-10-CM | POA: Diagnosis not present

## 2015-10-30 ENCOUNTER — Other Ambulatory Visit: Payer: Self-pay | Admitting: Internal Medicine

## 2015-10-30 DIAGNOSIS — Z1231 Encounter for screening mammogram for malignant neoplasm of breast: Secondary | ICD-10-CM

## 2015-11-07 ENCOUNTER — Encounter: Payer: Self-pay | Admitting: Internal Medicine

## 2015-11-14 ENCOUNTER — Ambulatory Visit
Admission: RE | Admit: 2015-11-14 | Discharge: 2015-11-14 | Disposition: A | Discharge status: Home / Self Care | Payer: BLUE CROSS/BLUE SHIELD | Source: Ambulatory Visit | Attending: Internal Medicine | Admitting: Internal Medicine

## 2015-11-14 DIAGNOSIS — Z1231 Encounter for screening mammogram for malignant neoplasm of breast: Secondary | ICD-10-CM

## 2015-11-28 DIAGNOSIS — D3132 Benign neoplasm of left choroid: Secondary | ICD-10-CM | POA: Diagnosis not present

## 2015-12-26 ENCOUNTER — Other Ambulatory Visit: Payer: Self-pay | Admitting: Internal Medicine

## 2016-02-05 ENCOUNTER — Other Ambulatory Visit (HOSPITAL_COMMUNITY)
Admission: RE | Admit: 2016-02-05 | Discharge: 2016-02-05 | Disposition: A | Discharge status: Home / Self Care | Payer: BLUE CROSS/BLUE SHIELD | Source: Ambulatory Visit | Attending: Internal Medicine | Admitting: Internal Medicine

## 2016-02-05 ENCOUNTER — Encounter: Payer: Self-pay | Admitting: General Surgery

## 2016-02-05 ENCOUNTER — Encounter: Payer: Self-pay | Admitting: Internal Medicine

## 2016-02-05 ENCOUNTER — Ambulatory Visit (INDEPENDENT_AMBULATORY_CARE_PROVIDER_SITE_OTHER): Payer: BLUE CROSS/BLUE SHIELD | Admitting: Internal Medicine

## 2016-02-05 VITALS — BP 110/80 | HR 81 | Temp 97.6°F | Resp 16 | Ht 63.0 in | Wt 185.0 lb

## 2016-02-05 DIAGNOSIS — R5383 Other fatigue: Secondary | ICD-10-CM | POA: Diagnosis not present

## 2016-02-05 DIAGNOSIS — E669 Obesity, unspecified: Secondary | ICD-10-CM

## 2016-02-05 DIAGNOSIS — Z1151 Encounter for screening for human papillomavirus (HPV): Secondary | ICD-10-CM | POA: Insufficient documentation

## 2016-02-05 DIAGNOSIS — Z0000 Encounter for general adult medical examination without abnormal findings: Secondary | ICD-10-CM

## 2016-02-05 DIAGNOSIS — Z01419 Encounter for gynecological examination (general) (routine) without abnormal findings: Secondary | ICD-10-CM | POA: Diagnosis not present

## 2016-02-05 DIAGNOSIS — E785 Hyperlipidemia, unspecified: Secondary | ICD-10-CM | POA: Diagnosis not present

## 2016-02-05 DIAGNOSIS — E559 Vitamin D deficiency, unspecified: Secondary | ICD-10-CM | POA: Diagnosis not present

## 2016-02-05 DIAGNOSIS — Z124 Encounter for screening for malignant neoplasm of cervix: Secondary | ICD-10-CM

## 2016-02-05 DIAGNOSIS — Z1211 Encounter for screening for malignant neoplasm of colon: Secondary | ICD-10-CM

## 2016-02-05 LAB — CBC WITH DIFFERENTIAL/PLATELET
BASOS ABS: 0 10*3/uL (ref 0.0–0.1)
Basophils Relative: 0.8 % (ref 0.0–3.0)
EOS ABS: 0.1 10*3/uL (ref 0.0–0.7)
Eosinophils Relative: 1.6 % (ref 0.0–5.0)
HEMATOCRIT: 41.9 % (ref 36.0–46.0)
Hemoglobin: 13.9 g/dL (ref 12.0–15.0)
LYMPHS PCT: 44.8 % (ref 12.0–46.0)
Lymphs Abs: 2.5 10*3/uL (ref 0.7–4.0)
MCHC: 33.2 g/dL (ref 30.0–36.0)
MCV: 86.5 fl (ref 78.0–100.0)
MONOS PCT: 5.6 % (ref 3.0–12.0)
Monocytes Absolute: 0.3 10*3/uL (ref 0.1–1.0)
Neutro Abs: 2.6 10*3/uL (ref 1.4–7.7)
Neutrophils Relative %: 47.2 % (ref 43.0–77.0)
Platelets: 239 10*3/uL (ref 150.0–400.0)
RBC: 4.85 Mil/uL (ref 3.87–5.11)
RDW: 13.2 % (ref 11.5–15.5)
WBC: 5.5 10*3/uL (ref 4.0–10.5)

## 2016-02-05 LAB — TSH: TSH: 3.24 u[IU]/mL (ref 0.35–4.50)

## 2016-02-05 LAB — COMPREHENSIVE METABOLIC PANEL
ALBUMIN: 4.3 g/dL (ref 3.5–5.2)
ALK PHOS: 61 U/L (ref 39–117)
ALT: 15 U/L (ref 0–35)
AST: 15 U/L (ref 0–37)
BUN: 13 mg/dL (ref 6–23)
CHLORIDE: 105 meq/L (ref 96–112)
CO2: 31 mEq/L (ref 19–32)
Calcium: 9.5 mg/dL (ref 8.4–10.5)
Creatinine, Ser: 0.7 mg/dL (ref 0.40–1.20)
GFR: 90.25 mL/min (ref >60.00)
Glucose, Bld: 104 mg/dL — ABNORMAL HIGH (ref 70–99)
POTASSIUM: 4.5 meq/L (ref 3.5–5.1)
SODIUM: 140 meq/L (ref 135–145)
Total Bilirubin: 0.3 mg/dL (ref 0.2–1.2)
Total Protein: 6.9 g/dL (ref 6.0–8.3)

## 2016-02-05 LAB — LIPID PANEL
CHOL/HDL RATIO: 4
Cholesterol: 212 mg/dL — ABNORMAL HIGH (ref 0–200)
HDL: 56.4 mg/dL (ref >39.00)
LDL Cholesterol: 124 mg/dL — ABNORMAL HIGH (ref 0–99)
NonHDL: 155.18
Triglycerides: 156 mg/dL — ABNORMAL HIGH (ref 0.0–149.0)
VLDL: 31.2 mg/dL (ref 0.0–40.0)

## 2016-02-05 LAB — VITAMIN D 25 HYDROXY (VIT D DEFICIENCY, FRACTURES): VITD: 31.79 ng/mL (ref 30.00–100.00)

## 2016-02-05 MED ORDER — FLUTICASONE PROPIONATE 50 MCG/ACT NA SUSP: 2.0000 | Freq: Every evening | NASAL | 10 refills | Status: DC | PRN

## 2016-02-05 MED ORDER — NEXIUM 40 MG PO CPDR: DELAYED_RELEASE_CAPSULE | ORAL | 11 refills | Status: DC

## 2016-02-05 MED ORDER — ESTROGENS, CONJUGATED 0.625 MG/GM VA CREA: TOPICAL_CREAM | VAGINAL | 11 refills | Status: DC

## 2016-02-05 MED ORDER — VALACYCLOVIR HCL 1 G PO TABS: 1000.0000 mg | ORAL_TABLET | Freq: Three times a day (TID) | ORAL | 1 refills | Status: DC

## 2016-02-05 NOTE — Patient Instructions (Addendum)
The ShingRx vaccine will be available in about 6 months and IS ADVISED for all interested adults over 50 to prevent shingles   Health Maintenance for Postmenopausal Women Introduction Menopause is a normal process in which your reproductive ability comes to an end. This process happens gradually over a span of months to years, usually between the ages of 25 and 80. Menopause is complete when you have missed 12 consecutive menstrual periods. It is important to talk with your health care provider about some of the most common conditions that affect postmenopausal women, such as heart disease, cancer, and bone loss (osteoporosis). Adopting a healthy lifestyle and getting preventive care can help to promote your health and wellness. Those actions can also lower your chances of developing some of these common conditions. What should I know about menopause? During menopause, you may experience a number of symptoms, such as:  Moderate-to-severe hot flashes.  Night sweats.  Decrease in sex drive.  Mood swings.  Headaches.  Tiredness.  Irritability.  Memory problems.  Insomnia. Choosing to treat or not to treat menopausal changes is an individual decision that you make with your health care provider. What should I know about hormone replacement therapy and supplements? Hormone therapy products are effective for treating symptoms that are associated with menopause, such as hot flashes and night sweats. Hormone replacement carries certain risks, especially as you become older. If you are thinking about using estrogen or estrogen with progestin treatments, discuss the benefits and risks with your health care provider. What should I know about heart disease and stroke? Heart disease, heart attack, and stroke become more likely as you age. This may be due, in part, to the hormonal changes that your body experiences during menopause. These can affect how your body processes dietary fats,  triglycerides, and cholesterol. Heart attack and stroke are both medical emergencies. There are many things that you can do to help prevent heart disease and stroke:  Have your blood pressure checked at least every 1-2 years. High blood pressure causes heart disease and increases the risk of stroke.  If you are 60-68 years old, ask your health care provider if you should take aspirin to prevent a heart attack or a stroke.  Do not use any tobacco products, including cigarettes, chewing tobacco, or electronic cigarettes. If you need help quitting, ask your health care provider.  It is important to eat a healthy diet and maintain a healthy weight.  Be sure to include plenty of vegetables, fruits, low-fat dairy products, and lean protein.  Avoid eating foods that are high in solid fats, added sugars, or salt (sodium).  Get regular exercise. This is one of the most important things that you can do for your health.  Try to exercise for at least 150 minutes each week. The type of exercise that you do should increase your heart rate and make you sweat. This is known as moderate-intensity exercise.  Try to do strengthening exercises at least twice each week. Do these in addition to the moderate-intensity exercise.  Know your numbers.Ask your health care provider to check your cholesterol and your blood glucose. Continue to have your blood tested as directed by your health care provider. What should I know about cancer screening? There are several types of cancer. Take the following steps to reduce your risk and to catch any cancer development as early as possible. Breast Cancer  Practice breast self-awareness.  This means understanding how your breasts normally appear and feel.  It  also means doing regular breast self-exams. Let your health care provider know about any changes, no matter how small.  If you are 32 or older, have a clinician do a breast exam (clinical breast exam or CBE) every  year. Depending on your age, family history, and medical history, it may be recommended that you also have a yearly breast X-ray (mammogram).  If you have a family history of breast cancer, talk with your health care provider about genetic screening.  If you are at high risk for breast cancer, talk with your health care provider about having an MRI and a mammogram every year.  Breast cancer (BRCA) gene test is recommended for women who have family members with BRCA-related cancers. Results of the assessment will determine the need for genetic counseling and BRCA1 and for BRCA2 testing. BRCA-related cancers include these types:  Breast. This occurs in males or females.  Ovarian.  Tubal. This may also be called fallopian tube cancer.  Cancer of the abdominal or pelvic lining (peritoneal cancer).  Prostate.  Pancreatic. Cervical, Uterine, and Ovarian Cancer  Your health care provider may recommend that you be screened regularly for cancer of the pelvic organs. These include your ovaries, uterus, and vagina. This screening involves a pelvic exam, which includes checking for microscopic changes to the surface of your cervix (Pap test).  For women ages 21-65, health care providers may recommend a pelvic exam and a Pap test every three years. For women ages 79-65, they may recommend the Pap test and pelvic exam, combined with testing for human papilloma virus (HPV), every five years. Some types of HPV increase your risk of cervical cancer. Testing for HPV may also be done on women of any age who have unclear Pap test results.  Other health care providers may not recommend any screening for nonpregnant women who are considered low risk for pelvic cancer and have no symptoms. Ask your health care provider if a screening pelvic exam is right for you.  If you have had past treatment for cervical cancer or a condition that could lead to cancer, you need Pap tests and screening for cancer for at least  20 years after your treatment. If Pap tests have been discontinued for you, your risk factors (such as having a new sexual partner) need to be reassessed to determine if you should start having screenings again. Some women have medical problems that increase the chance of getting cervical cancer. In these cases, your health care provider may recommend that you have screening and Pap tests more often.  If you have a family history of uterine cancer or ovarian cancer, talk with your health care provider about genetic screening.  If you have vaginal bleeding after reaching menopause, tell your health care provider.  There are currently no reliable tests available to screen for ovarian cancer. Lung Cancer  Lung cancer screening is recommended for adults 57-29 years old who are at high risk for lung cancer because of a history of smoking. A yearly low-dose CT scan of the lungs is recommended if you:  Currently smoke.  Have a history of at least 30 pack-years of smoking and you currently smoke or have quit within the past 15 years. A pack-year is smoking an average of one pack of cigarettes per day for one year. Yearly screening should:  Continue until it has been 15 years since you quit.  Stop if you develop a health problem that would prevent you from having lung cancer treatment. Colorectal  Cancer  This type of cancer can be detected and can often be prevented.  Routine colorectal cancer screening usually begins at age 26 and continues through age 108.  If you have risk factors for colon cancer, your health care provider may recommend that you be screened at an earlier age.  If you have a family history of colorectal cancer, talk with your health care provider about genetic screening.  Your health care provider may also recommend using home test kits to check for hidden blood in your stool.  A small camera at the end of a tube can be used to examine your colon directly (sigmoidoscopy or  colonoscopy). This is done to check for the earliest forms of colorectal cancer.  Direct examination of the colon should be repeated every 5-10 years until age 65. However, if early forms of precancerous polyps or small growths are found or if you have a family history or genetic risk for colorectal cancer, you may need to be screened more often. Skin Cancer  Check your skin from head to toe regularly.  Monitor any moles. Be sure to tell your health care provider:  About any new moles or changes in moles, especially if there is a change in a mole's shape or color.  If you have a mole that is larger than the size of a pencil eraser.  If any of your family members has a history of skin cancer, especially at a young age, talk with your health care provider about genetic screening.  Always use sunscreen. Apply sunscreen liberally and repeatedly throughout the day.  Whenever you are outside, protect yourself by wearing long sleeves, pants, a wide-brimmed hat, and sunglasses. What should I know about osteoporosis? Osteoporosis is a condition in which bone destruction happens more quickly than new bone creation. After menopause, you may be at an increased risk for osteoporosis. To help prevent osteoporosis or the bone fractures that can happen because of osteoporosis, the following is recommended:  If you are 17-9 years old, get at least 1,000 mg of calcium and at least 600 mg of vitamin D per day.  If you are older than age 48 but younger than age 79, get at least 1,200 mg of calcium and at least 600 mg of vitamin D per day.  If you are older than age 75, get at least 1,200 mg of calcium and at least 800 mg of vitamin D per day. Smoking and excessive alcohol intake increase the risk of osteoporosis. Eat foods that are rich in calcium and vitamin D, and do weight-bearing exercises several times each week as directed by your health care provider. What should I know about how menopause affects my  mental health? Depression may occur at any age, but it is more common as you become older. Common symptoms of depression include:  Low or sad mood.  Changes in sleep patterns.  Changes in appetite or eating patterns.  Feeling an overall lack of motivation or enjoyment of activities that you previously enjoyed.  Frequent crying spells. Talk with your health care provider if you think that you are experiencing depression. What should I know about immunizations? It is important that you get and maintain your immunizations. These include:  Tetanus, diphtheria, and pertussis (Tdap) booster vaccine.  Influenza every year before the flu season begins.  Pneumonia vaccine.  Shingles vaccine. Your health care provider may also recommend other immunizations. This information is not intended to replace advice given to you by your health care  provider. Make sure you discuss any questions you have with your health care provider. Document Released: 02/14/2005 Document Revised: 07/13/2015 Document Reviewed: 09/26/2014  2017 [employer]

## 2016-02-05 NOTE — Progress Notes (Signed)
Patient ID: Kathleen Russell, female    DOB: 1955-11-18  Age: 61 y.o. MRN: [id number]  The patient is here for annual  examination and management of other chronic and acute problems.  Last PAP 2013 andnormal, she requires cervical ca screening until 65 as she is s/p supracervical TAH/BSO 2000  Mammogram  Nov 2017 colonoscopy 2006   The risk factors are reflected in the social history.  The roster of all physicians providing medical care to patient - is listed in the Snapshot section of the chart.  Activities of daily living:  The patient is 100% independent in all ADLs: dressing, toileting, feeding as well as independent mobility  Home safety : The patient has smoke detectors in the home. They wear seatbelts.  There are no firearms at home. There is no violence in the home.   There is no risks for hepatitis, STDs or HIV. There is no   history of blood transfusion. They have no travel history to infectious disease endemic areas of the world.  The patient has seen their dentist in the last six month. They have seen their eye doctor in the last year.   They do not  have excessive sun exposure. Discussed the need for sun protection: hats, long sleeves and use of sunscreen if there is significant sun exposure.   Diet: the importance of a healthy diet is discussed. They do have a healthy diet.she and husband are losing weight following the [employer],  A biblically based lifestyle endorsed by her chruch and her gyn [employer].  The benefits of regular aerobic exercise were discussed. She exercises 3 to 5 times per week .   Depression screen: there are no signs or vegative symptoms of depression- irritability, change in appetite, anhedonia, sadness/tearfullness.  The following portions of the patient's history were reviewed and updated as appropriate: allergies, current medications, past family history, past medical history,  past surgical history, past social history  and problem  list.  Visual acuity was not assessed per patient preference since she has regular follow up with her ophthalmologist. Hearing and body mass index were assessed and reviewed.   During the course of the visit the patient was educated and counseled about appropriate screening and preventive services including : fall prevention , diabetes screening, nutrition counseling, colorectal cancer screening, and recommended immunizations.    CC: There were no encounter diagnoses.  History Kathleen Russell has a past medical history of allergic rhinitis; Broken ankle; Chicken pox; GERD (gastroesophageal reflux disease); History of broken nose; UTI (urinary tract infection); Kidney infection; Menopause, premature; Raynaud's phenomenon; and Rhinitis, nonallergic.   She has a past surgical history that includes Tonsillectomy and adenoidectomy; Abdominal hysterectomy; Eye surgery; Cesarean section; Total abdominal hysterectomy w/ bilateral salpingoophorectomy (2000); and ORIF ankle fracture (1980).   Her family history includes Alcohol abuse in her maternal uncle; Arthritis in her mother; Breast cancer in her maternal aunt; Cancer in her paternal uncle; Diabetes in her father, mother, paternal aunt, paternal uncle, and sister; Heart disease in her paternal uncle; Hyperlipidemia in her father; Hypertension in her paternal uncle.She reports that she has never smoked. She has never used smokeless tobacco. She reports that she does not drink alcohol or use drugs.  Outpatient Medications Prior to Visit  Medication Sig Dispense Refill  . cetirizine (ZYRTEC) 10 MG tablet Take 10 mg by mouth daily.    Marland Kitchen conjugated estrogens (PREMARIN) vaginal cream Place vaginally 3 (three) times a week. 30 g 11  . fluticasone (FLONASE)  50 MCG/ACT nasal spray Place 2 sprays into both nostrils at bedtime as needed.  10  . Multiple Vitamin (MULTIVITAMIN) tablet Take 1 tablet by mouth daily.    . predniSONE (DELTASONE) 10 MG tablet 6 tablets on Day  1 , then reduce by 1 tablet daily until gone 21 tablet 0  . valACYclovir (VALTREX) 1000 MG tablet Take 1 tablet (1,000 mg total) by mouth 3 (three) times daily. 21 tablet 1  . benzonatate (TESSALON) 200 MG capsule Take 1 capsule (200 mg total) by mouth 3 (three) times daily as needed for cough. (Patient not taking: Reported on 02/05/2016) 60 capsule 0  . chlorpheniramine-HYDROcodone (TUSSIONEX PENNKINETIC ER) 10-8 MG/5ML SUER Take 5 mLs by mouth at bedtime as needed for cough. (Patient not taking: Reported on 02/05/2016) 140 mL 0  . dextromethorphan-guaiFENesin (MUCINEX DM) 30-600 MG 12hr tablet Take 1 tablet by mouth 2 (two) times daily.    Marland Kitchen amoxicillin-clavulanate (AUGMENTIN) 875-125 MG tablet Take 1 tablet by mouth 2 (two) times daily. (Patient not taking: Reported on 02/05/2016) 14 tablet 0  . NEXIUM 40 MG capsule TAKE 1 CAPSULE EVERY MORNING BEFORE BREAKFAST (Patient not taking: Reported on 02/05/2016) 30 capsule 1   No facility-administered medications prior to visit.     Review of Systems   Patient denies headache, fevers, malaise, unintentional weight loss, skin rash, eye pain, sinus congestion and sinus pain, sore throat, dysphagia,  hemoptysis , cough, dyspnea, wheezing, chest pain, palpitations, orthopnea, edema, abdominal pain, nausea, melena, diarrhea, constipation, flank pain, dysuria, hematuria, urinary  Frequency, nocturia, numbness, tingling, seizures,  Focal weakness, Loss of consciousness,  Tremor, insomnia, depression, anxiety, and suicidal ideation.      Objective:  BP 110/80   Pulse 81   Temp 97.6 F (36.4 C) (Oral)   Resp 16   Ht 5\' 3"  (1.6 m)   Wt 185 lb (83.9 kg)   SpO2 95%   BMI 32.77 kg/m   Physical Exam   General Appearance:    Alert, cooperative, no distress, appears stated age  Head:    Normocephalic, without obvious abnormality, atraumatic  Eyes:    PERRL, conjunctiva/corneas clear, EOM's intact, fundi    benign, both eyes  Ears:    Normal TM's and  external ear canals, both ears  Nose:   Nares normal, septum midline, mucosa normal, no drainage    or sinus tenderness  Throat:   Lips, mucosa, and tongue normal; teeth and gums normal  Neck:   Supple, symmetrical, trachea midline, no adenopathy;    thyroid:  no enlargement/tenderness/nodules; no carotid   bruit or JVD  Back:     Symmetric, no curvature, ROM normal, no CVA tenderness  Lungs:     Clear to auscultation bilaterally, respirations unlabored  Chest Wall:    No tenderness or deformity   Heart:    Regular rate and rhythm, S1 and S2 normal, no murmur, rub   or gallop  Breast Exam:    No tenderness, masses, or nipple abnormality  Abdomen:     Soft, non-tender, bowel sounds active all four quadrants,    no masses, no organomegaly  Genitalia:    Pelvic: cervix normal in appearance, external genitalia normal, no adnexal masses or tenderness, no cervical motion tenderness, rectovaginal septum normal, uterus absent, shape, and consistency and vagina normal without discharge  Extremities:   Extremities normal, atraumatic, no cyanosis or edema  Pulses:   2+ and symmetric all extremities  Skin:   Skin color, texture, turgor normal,  no rashes or lesions  Lymph nodes:   Cervical, supraclavicular, and axillary nodes normal  Neurologic:   CNII-XII intact, normal strength, sensation and reflexes    throughout     Assessment & Plan:   Problem List Items Addressed This Visit    None      I have discontinued Ms. Petteway's amoxicillin-clavulanate and NEXIUM. I am also having her maintain her cetirizine, multivitamin, valACYclovir, fluticasone, dextromethorphan-guaiFENesin, chlorpheniramine-HYDROcodone, benzonatate, predniSONE, conjugated estrogens, and XIIDRA.  Meds ordered this encounter  Medications  . XIIDRA 5 % SOLN    Sig: Place 1 drop into both eyes 2 (two) times daily.    Refill:  5    Medications Discontinued During This Encounter  Medication Reason  . amoxicillin-clavulanate  (AUGMENTIN) 875-125 MG tablet Completed Course  . NEXIUM 40 MG capsule Completed Course    Follow-up: No Follow-up on file.   Crecencio Mc, MD

## 2016-02-06 LAB — CYTOLOGY - PAP
Diagnosis: NEGATIVE
HPV: NOT DETECTED

## 2016-02-07 ENCOUNTER — Encounter: Payer: Self-pay | Admitting: Internal Medicine

## 2016-02-07 NOTE — Assessment & Plan Note (Signed)
Annual comprehensive preventive exam was done as well as an evaluation and management of chronic conditions .  During the course of the visit the patient was educated and counseled about appropriate screening and preventive services including :  diabetes screening, lipid analysis with projected  10 year  risk for CAD , nutrition counseling, breast, cervical and colorectal cancer screening, and recommended immunizations.  Printed recommendations for health maintenance screenings was given 

## 2016-02-07 NOTE — Assessment & Plan Note (Signed)
Her PAP smear and HPV screen were normal.  she will need a repeat PAP smear in 3 years  

## 2016-02-07 NOTE — Assessment & Plan Note (Signed)
Her 10 yr risk of CAD is < 5%   .  Marland Kitchen  Lab Results  Component Value Date   CHOL 212 (H) 02/05/2016   HDL 56.40 02/05/2016   LDLCALC 124 (H) 02/05/2016   LDLDIRECT 123.0 02/02/2015   TRIG 156.0 (H) 02/05/2016   CHOLHDL 4 02/05/2016

## 2016-02-07 NOTE — Assessment & Plan Note (Signed)
I have congratulated her in making lifestyle changes for reduction of   BMI and encouraged  Continued weight loss with goal of 10% of body weigth over the next 6 months using a low glycemic index diet and regular exercise a minimum of 5 days per week.

## 2016-02-18 ENCOUNTER — Ambulatory Visit (INDEPENDENT_AMBULATORY_CARE_PROVIDER_SITE_OTHER): Payer: BLUE CROSS/BLUE SHIELD | Admitting: General Surgery

## 2016-02-18 ENCOUNTER — Encounter: Payer: Self-pay | Admitting: General Surgery

## 2016-02-18 VITALS — BP 124/72 | HR 76 | Resp 12 | Ht 64.0 in | Wt 186.0 lb

## 2016-02-18 DIAGNOSIS — Z1211 Encounter for screening for malignant neoplasm of colon: Secondary | ICD-10-CM | POA: Diagnosis not present

## 2016-02-18 DIAGNOSIS — R194 Change in bowel habit: Secondary | ICD-10-CM

## 2016-02-18 MED ORDER — POLYETHYLENE GLYCOL 3350 17 GM/SCOOP PO POWD: ORAL | 0 refills | Status: DC
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
## 2016-02-18 NOTE — Patient Instructions (Signed)
Colonoscopy, Adult A colonoscopy is an exam to look at the entire large intestine. During the exam, a lubricated, bendable tube is inserted into the anus and then passed into the rectum, colon, and other parts of the large intestine. A colonoscopy is often done as a part of normal colorectal screening or in response to certain symptoms, such as anemia, persistent diarrhea, abdominal pain, and blood in the stool. The exam can help screen for and diagnose medical problems, including:  Tumors.  Polyps.  Inflammation.  Areas of bleeding. Tell a health care provider about:  Any allergies you have.  All medicines you are taking, including vitamins, herbs, eye drops, creams, and over-the-counter medicines.  Any problems you or family members have had with anesthetic medicines.  Any blood disorders you have.  Any surgeries you have had.  Any medical conditions you have.  Any problems you have had passing stool. What are the risks? Generally, this is a safe procedure. However, problems may occur, including:  Bleeding.  A tear in the intestine.  A reaction to medicines given during the exam.  Infection (rare). What happens before the procedure? Eating and drinking restrictions  Follow instructions from your health care provider about eating and drinking, which may include:  A few days before the procedure - follow a low-fiber diet. Avoid nuts, seeds, dried fruit, raw fruits, and vegetables.  1-3 days before the procedure - follow a clear liquid diet. Drink only clear liquids, such as clear broth or bouillon, black coffee or tea, clear juice, clear soft drinks or sports drinks, gelatin desert, and popsicles. Avoid any liquids that contain red or purple dye.  On the day of the procedure - do not eat or drink anything during the 2 hours before the procedure, or within the time period that your health care provider recommends. Bowel prep  If you were prescribed an oral bowel prep to  clean out your colon:  Take it as told by your health care provider. Starting the day before your procedure, you will need to drink a large amount of medicated liquid. The liquid will cause you to have multiple loose stools until your stool is almost clear or light green.  If your skin or anus gets irritated from diarrhea, you may use these to relieve the irritation:  Medicated wipes, such as adult wet wipes with aloe and vitamin E.  A skin soothing-product like petroleum jelly.  If you vomit while drinking the bowel prep, take a break for up to 60 minutes and then begin the bowel prep again. If vomiting continues and you cannot take the bowel prep without vomiting, call your health care provider. General instructions  Ask your health care provider about changing or stopping your regular medicines. This is especially important if you are taking diabetes medicines or blood thinners.  Plan to have someone take you home from the hospital or clinic. What happens during the procedure?  An IV tube may be inserted into one of your veins.  You will be given medicine to help you relax (sedative).  To reduce your risk of infection:  Your health care team will wash or sanitize their hands.  Your anal area will be washed with soap.  You will be asked to lie on your side with your knees bent.  Your health care provider will lubricate a long, thin, flexible tube. The tube will have a camera and a light on the end.  The tube will be inserted into your anus.  The tube will be gently eased through your rectum and colon.  Air will be delivered into your colon to keep it open. You may feel some pressure or cramping.  The camera will be used to take images during the procedure.  A small tissue sample may be removed from your body to be examined under a microscope (biopsy). If any potential problems are found, the tissue will be sent to a lab for testing.  If small polyps are found, your health  care provider may remove them and have them checked for cancer cells.  The tube that was inserted into your anus will be slowly removed. The procedure may vary among health care providers and hospitals. What happens after the procedure?  Your blood pressure, heart rate, breathing rate, and blood oxygen level will be monitored until the medicines you were given have worn off.  Do not drive for 24 hours after the exam.  You may have a small amount of blood in your stool.  You may pass gas and have mild abdominal cramping or bloating due to the air that was used to inflate your colon during the exam.  It is up to you to get the results of your procedure. Ask your health care provider, or the department performing the procedure, when your results will be ready. This information is not intended to replace advice given to you by your health care provider. Make sure you discuss any questions you have with your health care provider. Document Released: 12/21/1999 Document Revised: 07/13/2015 Document Reviewed: 03/06/2015 Elsevier Interactive Patient Education  2017 Elsevier Inc.  

## 2016-03-21 DIAGNOSIS — B9689 Other specified bacterial agents as the cause of diseases classified elsewhere: Secondary | ICD-10-CM | POA: Diagnosis not present

## 2016-03-21 DIAGNOSIS — J329 Chronic sinusitis, unspecified: Secondary | ICD-10-CM | POA: Diagnosis not present

## 2016-04-02 ENCOUNTER — Encounter: Payer: Self-pay | Admitting: *Deleted

## 2016-04-09 ENCOUNTER — Ambulatory Visit
Admission: RE | Admit: 2016-04-09 | Discharge: 2016-04-09 | Disposition: A | Discharge status: Home / Self Care | Payer: BLUE CROSS/BLUE SHIELD | Source: Ambulatory Visit | Attending: General Surgery | Admitting: General Surgery

## 2016-04-09 ENCOUNTER — Encounter
Admission: RE | Disposition: A | Discharge status: Home / Self Care | Payer: Self-pay | Source: Ambulatory Visit | Attending: General Surgery

## 2016-04-09 ENCOUNTER — Ambulatory Visit: Payer: BLUE CROSS/BLUE SHIELD | Admitting: Certified Registered Nurse Anesthetist

## 2016-04-09 DIAGNOSIS — R194 Change in bowel habit: Secondary | ICD-10-CM

## 2016-04-09 DIAGNOSIS — I7300 Raynaud's syndrome without gangrene: Secondary | ICD-10-CM | POA: Diagnosis not present

## 2016-04-09 DIAGNOSIS — Z1211 Encounter for screening for malignant neoplasm of colon: Secondary | ICD-10-CM | POA: Diagnosis not present

## 2016-04-09 DIAGNOSIS — Z79899 Other long term (current) drug therapy: Secondary | ICD-10-CM | POA: Insufficient documentation

## 2016-04-09 DIAGNOSIS — K5730 Diverticulosis of large intestine without perforation or abscess without bleeding: Secondary | ICD-10-CM | POA: Diagnosis not present

## 2016-04-09 DIAGNOSIS — K5790 Diverticulosis of intestine, part unspecified, without perforation or abscess without bleeding: Secondary | ICD-10-CM | POA: Diagnosis not present

## 2016-04-09 DIAGNOSIS — K219 Gastro-esophageal reflux disease without esophagitis: Secondary | ICD-10-CM | POA: Insufficient documentation

## 2016-04-09 HISTORY — PX: COLONOSCOPY WITH PROPOFOL: SHX5780

## 2016-04-09 SURGERY — COLONOSCOPY WITH PROPOFOL
Anesthesia: General

## 2016-04-09 MED ORDER — PROPOFOL 10 MG/ML IV BOLUS
INTRAVENOUS | Status: AC
  Filled 2016-04-09: qty 20

## 2016-04-09 MED ORDER — PROPOFOL 500 MG/50ML IV EMUL
INTRAVENOUS | Status: DC | PRN
  Administered 2016-04-09: 150 ug/kg/min via INTRAVENOUS

## 2016-04-09 MED ORDER — SODIUM CHLORIDE 0.9 % IV SOLN
INTRAVENOUS | Status: DC | PRN
  Administered 2016-04-09: 08:00:00 via INTRAVENOUS

## 2016-04-09 MED ORDER — PHENYLEPHRINE HCL 10 MG/ML IJ SOLN
INTRAMUSCULAR | Status: DC | PRN
  Administered 2016-04-09 (×4): 100 ug via INTRAVENOUS

## 2016-04-09 MED ORDER — SODIUM CHLORIDE 0.9 % IV SOLN
INTRAVENOUS | Status: DC
  Administered 2016-04-09: 1000 mL via INTRAVENOUS

## 2016-04-09 MED ORDER — PROPOFOL 500 MG/50ML IV EMUL
INTRAVENOUS | Status: AC
  Filled 2016-04-09: qty 50

## 2016-04-09 MED ORDER — GLYCOPYRROLATE 0.2 MG/ML IJ SOLN
INTRAMUSCULAR | Status: DC | PRN
  Administered 2016-04-09: 0.2 mg via INTRAVENOUS

## 2016-04-09 MED ORDER — PROPOFOL 10 MG/ML IV BOLUS
INTRAVENOUS | Status: DC | PRN
  Administered 2016-04-09: 70 mg via INTRAVENOUS

## 2016-04-09 MED ORDER — LIDOCAINE HCL (PF) 1 % IJ SOLN: 2.0000 mL | Freq: Once | INTRAMUSCULAR | Status: DC

## 2016-04-09 MED ORDER — GLYCOPYRROLATE 0.2 MG/ML IJ SOLN
INTRAMUSCULAR | Status: AC
  Filled 2016-04-09: qty 1
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
## 2016-04-09 NOTE — Op Note (Signed)
[facility] Gastroenterology Patient Name: Kathleen Russell Procedure Date: 04/09/2016 8:06 AM MRN: [id number] Account #: [id number] Date of Birth: May 14, 1955 Admit Type: Outpatient Age: 61 Room: [facility] ENDO ROOM 1 Gender: Female Note Status: Finalized Procedure:            Colonoscopy Indications:          Screening for colorectal malignant neoplasm Providers:            Robert Bellow, MD Referring MD:         Deborra Medina, MD (Referring MD) Medicines:            Monitored Anesthesia Care Complications:        No immediate complications. Procedure:            Pre-Anesthesia Assessment:                       - Prior to the procedure, a History and Physical was                        performed, and patient medications, allergies and                        sensitivities were reviewed. The patient's tolerance of                        previous anesthesia was reviewed.                       - The risks and benefits of the procedure and the                        sedation options and risks were discussed with the                        patient. All questions were answered and informed                        consent was obtained.                       After obtaining informed consent, the colonoscope was                        passed under direct vision. Throughout the procedure,                        the patient's blood pressure, pulse, and oxygen                        saturations were monitored continuously. The                        Colonoscope was introduced through the anus and                        advanced to the the cecum, identified by appendiceal                        orifice and ileocecal valve. The colonoscopy was  performed without difficulty. The patient tolerated the                        procedure well. The quality of the bowel preparation                        was excellent. Findings:      Many medium-mouthed diverticula  were found in the sigmoid colon.      The retroflexed view of the distal rectum and anal verge was normal and       showed no anal or rectal abnormalities. Impression:           - Diverticulosis in the sigmoid colon.                       - The distal rectum and anal verge are normal on                        retroflexion view.                       - No specimens collected. Recommendation:       - Repeat colonoscopy in 10 years for screening purposes. Procedure Code(s):    --- Professional ---                       [phone], Colonoscopy, flexible; diagnostic, including                        collection of specimen(s) by brushing or washing, when                        performed (separate procedure) Diagnosis Code(s):    --- Professional ---                       Z12.11, Encounter for screening for malignant neoplasm                        of colon                       K57.30, Diverticulosis of large intestine without                        perforation or abscess without bleeding CPT copyright 2016 American Medical Association. All rights reserved. The codes documented in this report are preliminary and upon coder review may  be revised to meet current compliance requirements. Robert Bellow, MD 04/09/2016 8:59:41 AM This report has been signed electronically. Number of Addenda: 0 Note Initiated On: 04/09/2016 8:06 AM Scope Withdrawal Time: 0 hours 7 minutes 19 seconds  Total Procedure Duration: 0 hours 30 minutes 39 seconds       [facility]

## 2016-04-09 NOTE — Anesthesia Post-op Follow-up Note (Cosign Needed)
Anesthesia QCDR form completed.        
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
## 2016-04-09 NOTE — Transfer of Care (Signed)
Immediate Anesthesia Transfer of Care Note  Patient: Kathleen Russell  Procedure(s) Performed: Procedure(s): COLONOSCOPY WITH PROPOFOL (N/A)  Patient Location: PACU  Anesthesia Type:General  Level of Consciousness: unresponsive  Airway & Oxygen Therapy: Patient Spontanous Breathing and Patient connected to nasal cannula oxygen  Post-op Assessment: Report given to RN and Post -op Vital signs reviewed and stable  Post vital signs: Reviewed and stable  Last Vitals:  Vitals:   04/09/16 0749 04/09/16 0901  BP: 130/72 107/65  Pulse: 88 93  Resp: 17 18  Temp: (!) 35.8 C 36.2 C    Last Pain:  Vitals:   04/09/16 0901  TempSrc: Tympanic         Complications: No apparent anesthesia complications

## 2016-04-10 ENCOUNTER — Encounter: Payer: Self-pay | Admitting: General Surgery

## 2016-04-10 NOTE — Anesthesia Postprocedure Evaluation (Signed)
Anesthesia Post Note  Patient: Kathleen Russell  Procedure(s) Performed: Procedure(s) (LRB): COLONOSCOPY WITH PROPOFOL (N/A)  Patient location during evaluation: PACU Anesthesia Type: General Level of consciousness: awake and alert Pain management: pain level controlled Vital Signs Assessment: post-procedure vital signs reviewed and stable Respiratory status: spontaneous breathing, nonlabored ventilation, respiratory function stable and patient connected to nasal cannula oxygen Cardiovascular status: blood pressure returned to baseline and stable Postop Assessment: no signs of nausea or vomiting Anesthetic complications: no     Last Vitals:  Vitals:   04/09/16 0920 04/09/16 0930  BP: 108/76 120/82  Pulse: 94 92  Resp: 19 18  Temp: 36.4 C     Last Pain:  Vitals:   04/09/16 0930  TempSrc:   PainSc: [place]

## 2016-04-16 ENCOUNTER — Encounter: Payer: Self-pay | Admitting: General Surgery

## 2016-05-28 DIAGNOSIS — H903 Sensorineural hearing loss, bilateral: Secondary | ICD-10-CM | POA: Diagnosis not present

## 2016-05-28 DIAGNOSIS — H6121 Impacted cerumen, right ear: Secondary | ICD-10-CM | POA: Diagnosis not present

## 2016-05-28 DIAGNOSIS — H9122 Sudden idiopathic hearing loss, left ear: Secondary | ICD-10-CM | POA: Diagnosis not present

## 2016-06-06 DIAGNOSIS — M25532 Pain in left wrist: Secondary | ICD-10-CM | POA: Insufficient documentation

## 2016-06-06 DIAGNOSIS — M25531 Pain in right wrist: Secondary | ICD-10-CM | POA: Insufficient documentation

## 2016-06-11 DIAGNOSIS — R51 Headache: Secondary | ICD-10-CM | POA: Diagnosis not present

## 2016-06-11 DIAGNOSIS — H903 Sensorineural hearing loss, bilateral: Secondary | ICD-10-CM | POA: Diagnosis not present

## 2016-10-09 DIAGNOSIS — Z23 Encounter for immunization: Secondary | ICD-10-CM | POA: Diagnosis not present

## 2016-10-23 DIAGNOSIS — Z85828 Personal history of other malignant neoplasm of skin: Secondary | ICD-10-CM | POA: Diagnosis not present

## 2016-11-06 ENCOUNTER — Other Ambulatory Visit: Payer: Self-pay | Admitting: Internal Medicine

## 2016-11-06 DIAGNOSIS — Z1231 Encounter for screening mammogram for malignant neoplasm of breast: Secondary | ICD-10-CM

## 2016-11-20 ENCOUNTER — Ambulatory Visit
Admission: RE | Admit: 2016-11-20 | Discharge: 2016-11-20 | Disposition: A | Discharge status: Home / Self Care | Payer: BLUE CROSS/BLUE SHIELD | Source: Ambulatory Visit | Attending: Internal Medicine | Admitting: Internal Medicine

## 2016-11-20 DIAGNOSIS — Z1231 Encounter for screening mammogram for malignant neoplasm of breast: Secondary | ICD-10-CM

## 2017-02-05 ENCOUNTER — Ambulatory Visit (INDEPENDENT_AMBULATORY_CARE_PROVIDER_SITE_OTHER): Payer: BLUE CROSS/BLUE SHIELD

## 2017-02-05 ENCOUNTER — Ambulatory Visit (INDEPENDENT_AMBULATORY_CARE_PROVIDER_SITE_OTHER): Payer: BLUE CROSS/BLUE SHIELD | Admitting: Internal Medicine

## 2017-02-05 ENCOUNTER — Encounter: Payer: Self-pay | Admitting: Internal Medicine

## 2017-02-05 VITALS — BP 108/78 | HR 79 | Temp 98.0°F | Resp 15 | Ht 63.0 in | Wt 188.6 lb

## 2017-02-05 DIAGNOSIS — K2100 Gastro-esophageal reflux disease with esophagitis, without bleeding: Secondary | ICD-10-CM

## 2017-02-05 DIAGNOSIS — I7300 Raynaud's syndrome without gangrene: Secondary | ICD-10-CM | POA: Diagnosis not present

## 2017-02-05 DIAGNOSIS — Z79899 Other long term (current) drug therapy: Secondary | ICD-10-CM | POA: Diagnosis not present

## 2017-02-05 DIAGNOSIS — M79645 Pain in left finger(s): Secondary | ICD-10-CM

## 2017-02-05 DIAGNOSIS — Z0000 Encounter for general adult medical examination without abnormal findings: Secondary | ICD-10-CM

## 2017-02-05 DIAGNOSIS — Z0001 Encounter for general adult medical examination with abnormal findings: Secondary | ICD-10-CM | POA: Diagnosis not present

## 2017-02-05 DIAGNOSIS — G5603 Carpal tunnel syndrome, bilateral upper limbs: Secondary | ICD-10-CM

## 2017-02-05 DIAGNOSIS — E785 Hyperlipidemia, unspecified: Secondary | ICD-10-CM | POA: Diagnosis not present

## 2017-02-05 DIAGNOSIS — M19042 Primary osteoarthritis, left hand: Secondary | ICD-10-CM | POA: Diagnosis not present

## 2017-02-05 DIAGNOSIS — R200 Anesthesia of skin: Secondary | ICD-10-CM

## 2017-02-05 DIAGNOSIS — D568 Other thalassemias: Secondary | ICD-10-CM

## 2017-02-05 LAB — COMPREHENSIVE METABOLIC PANEL
ALK PHOS: 52 U/L (ref 39–117)
ALT: 14 U/L (ref 0–35)
AST: 17 U/L (ref 0–37)
Albumin: 4.1 g/dL (ref 3.5–5.2)
BILIRUBIN TOTAL: 0.5 mg/dL (ref 0.2–1.2)
BUN: 18 mg/dL (ref 6–23)
CALCIUM: 8.9 mg/dL (ref 8.4–10.5)
CO2: 27 meq/L (ref 19–32)
Chloride: 107 mEq/L (ref 96–112)
Creatinine, Ser: 0.75 mg/dL (ref 0.40–1.20)
GFR: 83.07 mL/min (ref >60.00)
Glucose, Bld: 107 mg/dL — ABNORMAL HIGH (ref 70–99)
Potassium: 3.9 mEq/L (ref 3.5–5.1)
Sodium: 141 mEq/L (ref 135–145)
Total Protein: 7 g/dL (ref 6.0–8.3)

## 2017-02-05 LAB — LIPID PANEL
CHOL/HDL RATIO: 3
Cholesterol: 172 mg/dL (ref 0–200)
HDL: 53.9 mg/dL (ref >39.00)
LDL Cholesterol: 100 mg/dL — ABNORMAL HIGH (ref 0–99)
NonHDL: 117.88
TRIGLYCERIDES: 90 mg/dL (ref 0.0–149.0)
VLDL: 18 mg/dL (ref 0.0–40.0)

## 2017-02-05 LAB — VITAMIN B12: Vitamin B-12: 798 pg/mL (ref 211–911)

## 2017-02-05 LAB — VITAMIN D 25 HYDROXY (VIT D DEFICIENCY, FRACTURES): VITD: 35.68 ng/mL (ref 30.00–100.00)

## 2017-02-05 MED ORDER — PANTOPRAZOLE SODIUM 40 MG PO TBEC: 40.0000 mg | DELAYED_RELEASE_TABLET | Freq: Every day | ORAL | 3 refills | Status: DC

## 2017-02-05 MED ORDER — ZOSTER VAC RECOMB ADJUVANTED 50 MCG/0.5ML IM SUSR: 0.5000 mL | Freq: Once | INTRAMUSCULAR | 1 refills | Status: AC

## 2017-02-05 MED ORDER — ESTROGENS, CONJUGATED 0.625 MG/GM VA CREA: TOPICAL_CREAM | VAGINAL | 11 refills | Status: DC

## 2017-02-05 MED ORDER — NIFEDIPINE ER OSMOTIC RELEASE 30 MG PO TB24: 30.0000 mg | ORAL_TABLET | Freq: Every day | ORAL | 2 refills | Status: DC

## 2017-02-05 NOTE — Progress Notes (Signed)
Patient ID: Kathleen Russell, female    DOB: [birth year]  Age: 62 y.o. MRN: [id number]  The patient is here for annual preventive  examination and management of other chronic and acute problems.     The risk factors are reflected in the social history.  The roster of all physicians providing medical care to patient - is listed in the Snapshot section of the chart.  Activities of daily living:  The patient is 100% independent in all ADLs: dressing, toileting, feeding as well as independent mobility  Home safety : The patient has smoke detectors in the home. They wear seatbelts.  There are no firearms at home. There is no violence in the home.   There is no risks for hepatitis, STDs or HIV. There is no   history of blood transfusion. They have no travel history to infectious disease endemic areas of the world.  The patient has seen their dentist in the last six month. They have seen their eye doctor in the last year. They admit to slight hearing difficulty with regard to whispered voices and some television programs.  They have deferred audiologic testing in the last year.  They do not  have excessive sun exposure. Discussed the need for sun protection: hats, long sleeves and use of sunscreen if there is significant sun exposure.   Diet: the importance of a healthy diet is discussed. They do have a healthy diet.  The benefits of regular aerobic exercise were discussed. She  Is exercising using high intesnity aerobics 4 times per week ,  45  minutes.   Depression screen: there are no signs or vegative symptoms of depression- irritability, change in appetite, anhedonia, sadness/tearfullness.   The following portions of the patient's history were reviewed and updated as appropriate: allergies, current medications, past family history, past medical history,  past surgical history, past social history  and problem list.  Visual acuity was not assessed per patient preference since she has regular  follow up with her ophthalmologist. Hearing and body mass index were assessed and reviewed.   During the course of the visit the patient was educated and counseled about appropriate screening and preventive services including : fall prevention , diabetes screening, nutrition counseling, colorectal cancer screening, and recommended immunizations.    CC: The primary encounter diagnosis was Carpal tunnel syndrome on both sides. Diagnoses of Gastroesophageal reflux disease with esophagitis, Long-term use of high-risk medication, Hyperlipidemia LDL goal <100, Thumb pain, left, Visit for preventive health examination, Raynaud's phenomenon without gangrene, Bilateral hand numbness, and GERD with esophagitis were also pertinent to this visit.  Bilateral thumb pain   l > R  40 yrs of keyboard.  Thumb catches at the base.  CTS symptoms have been present more recently .  remote trauma to left  Thumb,  And renaud's symptoms coming back   GERD using nexium but inruance won't pay  Hoarseness has come back since she reduced the dose to otc 20 mg History Kathleen Russell has a past medical history of allergic rhinitis, Broken ankle, Chicken pox, GERD (gastroesophageal reflux disease), History of broken nose, UTI (urinary tract infection), Kidney infection, Menopause, premature, Raynaud's phenomenon, and Rhinitis, nonallergic.   She has a past surgical history that includes Tonsillectomy and adenoidectomy; Abdominal hysterectomy; Eye surgery; Cesarean section; Total abdominal hysterectomy w/ bilateral salpingoophorectomy (2000); ORIF ankle fracture (1980); and Colonoscopy with propofol (N/A, 04/09/2016).   Her family history includes Alcohol abuse in her maternal uncle; Arthritis in her mother; Breast cancer in her  maternal aunt; Cancer in her paternal uncle; Diabetes in her father, mother, paternal aunt, paternal uncle, and sister; Heart disease in her paternal uncle; Hyperlipidemia in her father; Hypertension in her paternal  uncle.She reports that  has never smoked. she has never used smokeless tobacco. She reports that she drinks alcohol. She reports that she does not use drugs.  Outpatient Medications Prior to Visit  Medication Sig Dispense Refill  . Biotin 1000 MCG tablet Take 1,000 mcg by mouth 3 (three) times daily.     . cetirizine (ZYRTEC) 10 MG tablet Take 10 mg by mouth daily.    . fluticasone (FLONASE) 50 MCG/ACT nasal spray Place 2 sprays into both nostrils at bedtime as needed. 16 g 10  . Multiple Vitamin (MULTIVITAMIN) tablet Take 1 tablet by mouth daily.    Marland Kitchen NEXIUM 40 MG capsule TAKE 1 CAPSULE EVERY MORNING BEFORE BREAKFAST 30 capsule 11  . valACYclovir (VALTREX) 1000 MG tablet Take 1 tablet (1,000 mg total) by mouth 3 (three) times daily. KEEP ON FILE FOR FUTURE REFILLS 21 tablet 1  . conjugated estrogens (PREMARIN) vaginal cream Place vaginally 3 (three) times a week. 30 g 11  . XIIDRA 5 % SOLN Place 1 drop into both eyes 2 (two) times daily.  5   No facility-administered medications prior to visit.     Review of Systems   Patient denies headache, fevers, malaise, unintentional weight loss, skin rash, eye pain, sinus congestion and sinus pain, sore throat, dysphagia,  hemoptysis , cough, dyspnea, wheezing, chest pain, palpitations, orthopnea, edema, abdominal pain, nausea, melena, diarrhea, constipation, flank pain, dysuria, hematuria, urinary  Frequency, nocturia, numbness, tingling, seizures,  Focal weakness, Loss of consciousness,  Tremor, insomnia, depression, anxiety, and suicidal ideation.      Objective:  BP 108/78 (BP Location: Left Arm, Patient Position: Sitting, Cuff Size: Normal)   Pulse 79   Temp 98 F (36.7 C) (Oral)   Resp 15   Ht 5\' 3"  (1.6 m)   Wt 188 lb 9.6 oz (85.5 kg)   SpO2 96%   BMI 33.41 kg/m   Physical Exam   General appearance: alert, cooperative and appears stated age Head: Normocephalic, without obvious abnormality, atraumatic Eyes: conjunctivae/corneas  clear. PERRL, EOM's intact. Fundi benign. Ears: normal TM's and external ear canals both ears Nose: Nares normal. Septum midline. Mucosa normal. No drainage or sinus tenderness. Throat: lips, mucosa, and tongue normal; teeth and gums normal Neck: no adenopathy, no carotid bruit, no JVD, supple, symmetrical, trachea midline and thyroid not enlarged, symmetric, no tenderness/mass/nodules Lungs: clear to auscultation bilaterally Breasts: normal appearance, no masses or tenderness Heart: regular rate and rhythm, S1, S2 normal, no murmur, click, rub or gallop Abdomen: soft, non-tender; bowel sounds normal; no masses,  no organomegaly Extremities: extremities normal, atraumatic, no cyanosis or edema Pulses: 2+ and symmetric Skin: Skin color, texture, turgor normal. No rashes or lesions Neurologic: Alert and oriented X 3, normal strength and tone. Normal symmetric reflexes. Normal coordination and gait.      Assessment & Plan:   Problem List Items Addressed This Visit    Bilateral hand numbness    Suggestive of CTS.  Neurology referral for testing       GERD with esophagitis    Due to lapse in nexium coverage. protonix prescribed       Raynaud phenomenon    Trial of nifedipine offered. Advised to use hand warmers,       Thumb pain, left    SHE HAS MILD  DJD BY PLAIN FILMS  TRIAL OF NSAIDS AND TYLENOL       Relevant Orders   DG Finger Thumb Left (Completed)   Visit for preventive health examination    Annual comprehensive preventive exam was done as well as an evaluation and management of chronic conditions .  During the course of the visit the patient was educated and counseled about appropriate screening and preventive services including :  diabetes screening, lipid analysis with projected  10 year  risk for CAD , nutrition counseling, breast, cervical and colorectal cancer screening, and recommended immunizations.  Printed recommendations for health maintenance screenings was give        Other Visit Diagnoses    Carpal tunnel syndrome on both sides    -  Primary   Relevant Orders   Ambulatory referral to Neurology   Gastroesophageal reflux disease with esophagitis       Relevant Medications   pantoprazole (PROTONIX) 40 MG tablet   Long-term use of high-risk medication       Relevant Orders   VITAMIN D 25 Hydroxy (Vit-D Deficiency, Fractures) (Completed)   B12 (Completed)   Comprehensive metabolic panel (Completed)   Hyperlipidemia LDL goal <100       Relevant Medications   NIFEdipine (PROCARDIA-XL/ADALAT-CC/NIFEDICAL-XL) 30 MG 24 hr tablet   Other Relevant Orders   Lipid panel (Completed)      I am having Ozella Almond. Siska "CINDY" start on NIFEdipine, pantoprazole, and Zoster Vaccine Adjuvanted. I am also having her maintain her cetirizine, multivitamin, XIIDRA, fluticasone, NEXIUM, valACYclovir, Biotin, and conjugated estrogens.  Meds ordered this encounter  Medications  . NIFEdipine (PROCARDIA-XL/ADALAT-CC/NIFEDICAL-XL) 30 MG 24 hr tablet    Sig: Take 1 tablet (30 mg total) by mouth daily.    Dispense:  30 tablet    Refill:  2  . pantoprazole (PROTONIX) 40 MG tablet    Sig: Take 1 tablet (40 mg total) by mouth daily.    Dispense:  30 tablet    Refill:  3  . Zoster Vaccine Adjuvanted [facility]) injection    Sig: Inject 0.5 mLs into the muscle once for 1 dose.    Dispense:  1 each    Refill:  1  . conjugated estrogens (PREMARIN) vaginal cream    Sig: Place vaginally 3 (three) times a week.    Dispense:  30 g    Refill:  11    Medications Discontinued During This Encounter  Medication Reason  . conjugated estrogens (PREMARIN) vaginal cream Reorder    Follow-up: Return in about 1 year (around 02/05/2018) for cpe.   Crecencio Mc, MD

## 2017-02-05 NOTE — Patient Instructions (Addendum)
I am givng you a trial of nifedipine to see if it prevents the pain from your Raynaud's syndrome  Referral to Neurology for diagnosis of suspected carpal tunnel sydrome  Plain filsn of left thumb to look for joint problesms   Try taking  (pantoprazole ; generic for protonix)  30 minutes before supper for your reflux.  This is an   rx I sent to pharmacy   Make sure you are  Setting a goal of 1200 mg of calcium and 1000 Ius vitamin  D daily  Through diet >  Supplements   The ShingRx vaccine is now available in local pharmacies and is much more protective thant Zostavaxs,  It is therefore ADVISED for all interested adults over 50 to prevent shingles    Health Maintenance for Postmenopausal Women Menopause is a normal process in which your reproductive ability comes to an end. This process happens gradually over a span of months to years, usually between the ages of 66 and 21. Menopause is complete when you have missed 12 consecutive menstrual periods. It is important to talk with your health care provider about some of the most common conditions that affect postmenopausal women, such as heart disease, cancer, and bone loss (osteoporosis). Adopting a healthy lifestyle and getting preventive care can help to promote your health and wellness. Those actions can also lower your chances of developing some of these common conditions. What should I know about menopause? During menopause, you may experience a number of symptoms, such as:  Moderate-to-severe hot flashes.  Night sweats.  Decrease in sex drive.  Mood swings.  Headaches.  Tiredness.  Irritability.  Memory problems.  Insomnia.  Choosing to treat or not to treat menopausal changes is an individual decision that you make with your health care provider. What should I know about hormone replacement therapy and supplements? Hormone therapy products are effective for treating symptoms that are associated with menopause, such as hot  flashes and night sweats. Hormone replacement carries certain risks, especially as you become older. If you are thinking about using estrogen or estrogen with progestin treatments, discuss the benefits and risks with your health care provider. What should I know about heart disease and stroke? Heart disease, heart attack, and stroke become more likely as you age. This may be due, in part, to the hormonal changes that your body experiences during menopause. These can affect how your body processes dietary fats, triglycerides, and cholesterol. Heart attack and stroke are both medical emergencies. There are many things that you can do to help prevent heart disease and stroke:  Have your blood pressure checked at least every 1-2 years. High blood pressure causes heart disease and increases the risk of stroke.  If you are 52-45 years old, ask your health care provider if you should take aspirin to prevent a heart attack or a stroke.  Do not use any tobacco products, including cigarettes, chewing tobacco, or electronic cigarettes. If you need help quitting, ask your health care provider.  It is important to eat a healthy diet and maintain a healthy weight. ? Be sure to include plenty of vegetables, fruits, low-fat dairy products, and lean protein. ? Avoid eating foods that are high in solid fats, added sugars, or salt (sodium).  Get regular exercise. This is one of the most important things that you can do for your health. ? Try to exercise for at least 150 minutes each week. The type of exercise that you do should increase your heart rate  and make you sweat. This is known as moderate-intensity exercise. ? Try to do strengthening exercises at least twice each week. Do these in addition to the moderate-intensity exercise.  Know your numbers.Ask your health care provider to check your cholesterol and your blood glucose. Continue to have your blood tested as directed by your health care provider.  What  should I know about cancer screening? There are several types of cancer. Take the following steps to reduce your risk and to catch any cancer development as early as possible. Breast Cancer  Practice breast self-awareness. ? This means understanding how your breasts normally appear and feel. ? It also means doing regular breast self-exams. Let your health care provider know about any changes, no matter how small.  If you are 79 or older, have a clinician do a breast exam (clinical breast exam or CBE) every year. Depending on your age, family history, and medical history, it may be recommended that you also have a yearly breast X-ray (mammogram).  If you have a family history of breast cancer, talk with your health care provider about genetic screening.  If you are at high risk for breast cancer, talk with your health care provider about having an MRI and a mammogram every year.  Breast cancer (BRCA) gene test is recommended for women who have family members with BRCA-related cancers. Results of the assessment will determine the need for genetic counseling and BRCA1 and for BRCA2 testing. BRCA-related cancers include these types: ? Breast. This occurs in males or females. ? Ovarian. ? Tubal. This may also be called fallopian tube cancer. ? Cancer of the abdominal or pelvic lining (peritoneal cancer). ? Prostate. ? Pancreatic.  Cervical, Uterine, and Ovarian Cancer Your health care provider may recommend that you be screened regularly for cancer of the pelvic organs. These include your ovaries, uterus, and vagina. This screening involves a pelvic exam, which includes checking for microscopic changes to the surface of your cervix (Pap test).  For women ages 21-65, health care providers may recommend a pelvic exam and a Pap test every three years. For women ages 46-65, they may recommend the Pap test and pelvic exam, combined with testing for human papilloma virus (HPV), every five years. Some  types of HPV increase your risk of cervical cancer. Testing for HPV may also be done on women of any age who have unclear Pap test results.  Other health care providers may not recommend any screening for nonpregnant women who are considered low risk for pelvic cancer and have no symptoms. Ask your health care provider if a screening pelvic exam is right for you.  If you have had past treatment for cervical cancer or a condition that could lead to cancer, you need Pap tests and screening for cancer for at least 20 years after your treatment. If Pap tests have been discontinued for you, your risk factors (such as having a new sexual partner) need to be reassessed to determine if you should start having screenings again. Some women have medical problems that increase the chance of getting cervical cancer. In these cases, your health care provider may recommend that you have screening and Pap tests more often.  If you have a family history of uterine cancer or ovarian cancer, talk with your health care provider about genetic screening.  If you have vaginal bleeding after reaching menopause, tell your health care provider.  There are currently no reliable tests available to screen for ovarian cancer.  Lung Cancer Lung  cancer screening is recommended for adults 54-76 years old who are at high risk for lung cancer because of a history of smoking. A yearly low-dose CT scan of the lungs is recommended if you:  Currently smoke.  Have a history of at least 30 pack-years of smoking and you currently smoke or have quit within the past 15 years. A pack-year is smoking an average of one pack of cigarettes per day for one year.  Yearly screening should:  Continue until it has been 15 years since you quit.  Stop if you develop a health problem that would prevent you from having lung cancer treatment.  Colorectal Cancer  This type of cancer can be detected and can often be prevented.  Routine colorectal  cancer screening usually begins at age 64 and continues through age 82.  If you have risk factors for colon cancer, your health care provider may recommend that you be screened at an earlier age.  If you have a family history of colorectal cancer, talk with your health care provider about genetic screening.  Your health care provider may also recommend using home test kits to check for hidden blood in your stool.  A small camera at the end of a tube can be used to examine your colon directly (sigmoidoscopy or colonoscopy). This is done to check for the earliest forms of colorectal cancer.  Direct examination of the colon should be repeated every 5-10 years until age 40. However, if early forms of precancerous polyps or small growths are found or if you have a family history or genetic risk for colorectal cancer, you may need to be screened more often.  Skin Cancer  Check your skin from head to toe regularly.  Monitor any moles. Be sure to tell your health care provider: ? About any new moles or changes in moles, especially if there is a change in a mole's shape or color. ? If you have a mole that is larger than the size of a pencil eraser.  If any of your family members has a history of skin cancer, especially at a young age, talk with your health care provider about genetic screening.  Always use sunscreen. Apply sunscreen liberally and repeatedly throughout the day.  Whenever you are outside, protect yourself by wearing long sleeves, pants, a wide-brimmed hat, and sunglasses.  What should I know about osteoporosis? Osteoporosis is a condition in which bone destruction happens more quickly than new bone creation. After menopause, you may be at an increased risk for osteoporosis. To help prevent osteoporosis or the bone fractures that can happen because of osteoporosis, the following is recommended:  If you are 64-28 years old, get at least 1,000 mg of calcium and at least 600 mg of  vitamin D per day.  If you are older than age 60 but younger than age 17, get at least 1,200 mg of calcium and at least 600 mg of vitamin D per day.  If you are older than age 81, get at least 1,200 mg of calcium and at least 800 mg of vitamin D per day.  Smoking and excessive alcohol intake increase the risk of osteoporosis. Eat foods that are rich in calcium and vitamin D, and do weight-bearing exercises several times each week as directed by your health care provider. What should I know about how menopause affects my mental health? Depression may occur at any age, but it is more common as you become older. Common symptoms of depression include:  Low or sad mood.  Changes in sleep patterns.  Changes in appetite or eating patterns.  Feeling an overall lack of motivation or enjoyment of activities that you previously enjoyed.  Frequent crying spells.  Talk with your health care provider if you think that you are experiencing depression. What should I know about immunizations? It is important that you get and maintain your immunizations. These include:  Tetanus, diphtheria, and pertussis (Tdap) booster vaccine.  Influenza every year before the flu season begins.  Pneumonia vaccine.  Shingles vaccine.  Your health care provider may also recommend other immunizations. This information is not intended to replace advice given to you by your health care provider. Make sure you discuss any questions you have with your health care provider. Document Released: 02/14/2005 Document Revised: 07/13/2015 Document Reviewed: 09/26/2014 Elsevier Interactive Patient Education  2018 [employer].

## 2017-02-06 ENCOUNTER — Encounter: Payer: Self-pay | Admitting: Internal Medicine

## 2017-02-07 DIAGNOSIS — M79645 Pain in left finger(s): Secondary | ICD-10-CM | POA: Insufficient documentation

## 2017-02-07 DIAGNOSIS — G5603 Carpal tunnel syndrome, bilateral upper limbs: Secondary | ICD-10-CM | POA: Insufficient documentation

## 2017-02-07 DIAGNOSIS — I7300 Raynaud's syndrome without gangrene: Secondary | ICD-10-CM | POA: Insufficient documentation

## 2017-02-07 DIAGNOSIS — K2100 Gastro-esophageal reflux disease with esophagitis, without bleeding: Secondary | ICD-10-CM | POA: Insufficient documentation

## 2017-02-07 NOTE — Assessment & Plan Note (Signed)
Annual comprehensive preventive exam was done as well as an evaluation and management of chronic conditions .  During the course of the visit the patient was educated and counseled about appropriate screening and preventive services including :  diabetes screening, lipid analysis with projected  10 year  risk for CAD , nutrition counseling, breast, cervical and colorectal cancer screening, and recommended immunizations.  Printed recommendations for health maintenance screenings was give 

## 2017-02-07 NOTE — Assessment & Plan Note (Signed)
SHE HAS MILD DJD BY PLAIN FILMS  TRIAL OF NSAIDS AND TYLENOL

## 2017-02-07 NOTE — Assessment & Plan Note (Signed)
Suggestive of CTS.  Neurology referral for testing

## 2017-02-07 NOTE — Assessment & Plan Note (Signed)
Due to lapse in nexium coverage. protonix prescribed

## 2017-02-07 NOTE — Assessment & Plan Note (Signed)
Trial of nifedipine offered. Advised to use hand warmers,

## 2017-02-10 MED ORDER — MELOXICAM 15 MG PO TABS: 15.0000 mg | ORAL_TABLET | Freq: Every day | ORAL | 3 refills | Status: DC

## 2017-02-12 DIAGNOSIS — D3132 Benign neoplasm of left choroid: Secondary | ICD-10-CM | POA: Diagnosis not present

## 2017-03-02 ENCOUNTER — Other Ambulatory Visit: Payer: Self-pay | Admitting: Internal Medicine

## 2017-03-03 DIAGNOSIS — R200 Anesthesia of skin: Secondary | ICD-10-CM | POA: Diagnosis not present

## 2017-03-03 DIAGNOSIS — R202 Paresthesia of skin: Secondary | ICD-10-CM | POA: Diagnosis not present

## 2017-03-05 ENCOUNTER — Encounter: Payer: Self-pay | Admitting: Internal Medicine

## 2017-03-09 DIAGNOSIS — J0140 Acute pansinusitis, unspecified: Secondary | ICD-10-CM | POA: Diagnosis not present

## 2017-03-11 DIAGNOSIS — G5603 Carpal tunnel syndrome, bilateral upper limbs: Secondary | ICD-10-CM | POA: Diagnosis not present

## 2017-03-19 DIAGNOSIS — G5603 Carpal tunnel syndrome, bilateral upper limbs: Secondary | ICD-10-CM | POA: Diagnosis not present

## 2017-06-04 DIAGNOSIS — M6530 Trigger finger, unspecified finger: Secondary | ICD-10-CM | POA: Diagnosis not present

## 2017-06-04 DIAGNOSIS — G5603 Carpal tunnel syndrome, bilateral upper limbs: Secondary | ICD-10-CM | POA: Diagnosis not present

## 2017-07-13 DIAGNOSIS — J0140 Acute pansinusitis, unspecified: Secondary | ICD-10-CM | POA: Diagnosis not present

## 2017-09-30 ENCOUNTER — Other Ambulatory Visit: Payer: Self-pay | Admitting: Internal Medicine

## 2017-10-15 DIAGNOSIS — Z23 Encounter for immunization: Secondary | ICD-10-CM | POA: Diagnosis not present

## 2017-10-22 DIAGNOSIS — L570 Actinic keratosis: Secondary | ICD-10-CM | POA: Diagnosis not present

## 2017-10-22 DIAGNOSIS — D2261 Melanocytic nevi of right upper limb, including shoulder: Secondary | ICD-10-CM | POA: Diagnosis not present

## 2017-10-22 DIAGNOSIS — D2272 Melanocytic nevi of left lower limb, including hip: Secondary | ICD-10-CM | POA: Diagnosis not present

## 2017-10-22 DIAGNOSIS — D2262 Melanocytic nevi of left upper limb, including shoulder: Secondary | ICD-10-CM | POA: Diagnosis not present

## 2017-10-22 DIAGNOSIS — Z85828 Personal history of other malignant neoplasm of skin: Secondary | ICD-10-CM | POA: Diagnosis not present

## 2017-11-17 ENCOUNTER — Other Ambulatory Visit: Payer: Self-pay | Admitting: Internal Medicine

## 2017-11-17 DIAGNOSIS — Z1231 Encounter for screening mammogram for malignant neoplasm of breast: Secondary | ICD-10-CM

## 2017-11-25 ENCOUNTER — Ambulatory Visit
Admission: RE | Admit: 2017-11-25 | Discharge: 2017-11-25 | Disposition: A | Discharge status: Home / Self Care | Payer: BLUE CROSS/BLUE SHIELD | Source: Ambulatory Visit | Attending: Internal Medicine | Admitting: Internal Medicine

## 2017-11-25 DIAGNOSIS — Z1231 Encounter for screening mammogram for malignant neoplasm of breast: Secondary | ICD-10-CM | POA: Insufficient documentation

## 2017-12-01 DIAGNOSIS — R05 Cough: Secondary | ICD-10-CM | POA: Diagnosis not present

## 2017-12-01 DIAGNOSIS — J0140 Acute pansinusitis, unspecified: Secondary | ICD-10-CM | POA: Diagnosis not present

## 2017-12-22 ENCOUNTER — Ambulatory Visit
Admission: RE | Admit: 2017-12-22 | Discharge: 2017-12-22 | Disposition: A | Discharge status: Home / Self Care | Payer: BLUE CROSS/BLUE SHIELD | Attending: Physician Assistant | Admitting: Physician Assistant

## 2017-12-22 ENCOUNTER — Ambulatory Visit
Admission: RE | Admit: 2017-12-22 | Discharge: 2017-12-22 | Disposition: A | Discharge status: Home / Self Care | Payer: BLUE CROSS/BLUE SHIELD | Source: Ambulatory Visit | Attending: Physician Assistant | Admitting: Physician Assistant

## 2017-12-22 ENCOUNTER — Other Ambulatory Visit: Payer: Self-pay | Admitting: Physician Assistant

## 2017-12-22 DIAGNOSIS — J4520 Mild intermittent asthma, uncomplicated: Secondary | ICD-10-CM | POA: Diagnosis not present

## 2017-12-22 DIAGNOSIS — R05 Cough: Secondary | ICD-10-CM | POA: Insufficient documentation

## 2017-12-22 DIAGNOSIS — R059 Cough, unspecified: Secondary | ICD-10-CM

## 2017-12-22 DIAGNOSIS — K219 Gastro-esophageal reflux disease without esophagitis: Secondary | ICD-10-CM | POA: Diagnosis not present

## 2017-12-24 DIAGNOSIS — J0140 Acute pansinusitis, unspecified: Secondary | ICD-10-CM | POA: Diagnosis not present

## 2017-12-24 DIAGNOSIS — R05 Cough: Secondary | ICD-10-CM | POA: Diagnosis not present

## 2017-12-24 DIAGNOSIS — J329 Chronic sinusitis, unspecified: Secondary | ICD-10-CM | POA: Diagnosis not present

## 2017-12-24 DIAGNOSIS — J320 Chronic maxillary sinusitis: Secondary | ICD-10-CM | POA: Diagnosis not present

## 2018-01-14 DIAGNOSIS — J45991 Cough variant asthma: Secondary | ICD-10-CM | POA: Diagnosis not present

## 2018-01-14 DIAGNOSIS — J329 Chronic sinusitis, unspecified: Secondary | ICD-10-CM | POA: Diagnosis not present

## 2018-02-08 ENCOUNTER — Encounter: Payer: Self-pay | Admitting: Internal Medicine

## 2018-02-08 ENCOUNTER — Ambulatory Visit (INDEPENDENT_AMBULATORY_CARE_PROVIDER_SITE_OTHER): Payer: BLUE CROSS/BLUE SHIELD | Admitting: Internal Medicine

## 2018-02-08 VITALS — BP 126/82 | HR 77 | Temp 97.7°F | Resp 14 | Ht 63.0 in | Wt 190.6 lb

## 2018-02-08 DIAGNOSIS — J329 Chronic sinusitis, unspecified: Secondary | ICD-10-CM

## 2018-02-08 DIAGNOSIS — R7301 Impaired fasting glucose: Secondary | ICD-10-CM

## 2018-02-08 DIAGNOSIS — E669 Obesity, unspecified: Secondary | ICD-10-CM

## 2018-02-08 DIAGNOSIS — N952 Postmenopausal atrophic vaginitis: Secondary | ICD-10-CM

## 2018-02-08 DIAGNOSIS — Z0000 Encounter for general adult medical examination without abnormal findings: Secondary | ICD-10-CM

## 2018-02-08 DIAGNOSIS — E785 Hyperlipidemia, unspecified: Secondary | ICD-10-CM

## 2018-02-08 DIAGNOSIS — R194 Change in bowel habit: Secondary | ICD-10-CM

## 2018-02-08 DIAGNOSIS — R7989 Other specified abnormal findings of blood chemistry: Secondary | ICD-10-CM

## 2018-02-08 DIAGNOSIS — G5603 Carpal tunnel syndrome, bilateral upper limbs: Secondary | ICD-10-CM

## 2018-02-08 LAB — LIPID PANEL
CHOLESTEROL: 248 mg/dL — AB (ref 0–200)
HDL: 60 mg/dL (ref >39.00)
LDL Cholesterol: 155 mg/dL — ABNORMAL HIGH (ref 0–99)
NonHDL: 188.2
Total CHOL/HDL Ratio: 4
Triglycerides: 166 mg/dL — ABNORMAL HIGH (ref 0.0–149.0)
VLDL: 33.2 mg/dL (ref 0.0–40.0)

## 2018-02-08 LAB — POCT GLYCOSYLATED HEMOGLOBIN (HGB A1C): Hemoglobin A1C: 5.8 % — AB (ref 4.0–5.6)

## 2018-02-08 LAB — CBC WITH DIFFERENTIAL/PLATELET
Absolute Monocytes: 226 cells/uL (ref 200–950)
BASOS ABS: 30 {cells}/uL (ref 0–200)
BASOS PCT: 0.8 %
EOS ABS: 107 {cells}/uL (ref 15–500)
EOS PCT: 2.9 %
HEMATOCRIT: 44.1 % (ref 35.0–45.0)
HEMOGLOBIN: 14.9 g/dL (ref 11.7–15.5)
LYMPHS ABS: 1809 {cells}/uL (ref 850–3900)
MCH: 29.3 pg (ref 27.0–33.0)
MCHC: 33.8 g/dL (ref 32.0–36.0)
MCV: 86.6 fL (ref 80.0–100.0)
MPV: 10.4 fL (ref 7.5–12.5)
Monocytes Relative: 6.1 %
NEUTROS ABS: 1528 {cells}/uL (ref 1500–7800)
Neutrophils Relative %: 41.3 %
Platelets: 180 10*3/uL (ref 140–400)
RBC: 5.09 10*6/uL (ref 3.80–5.10)
RDW: 13 % (ref 11.0–15.0)
Total Lymphocyte: 48.9 %
WBC: 3.7 10*3/uL — ABNORMAL LOW (ref 3.8–10.8)

## 2018-02-08 LAB — COMPREHENSIVE METABOLIC PANEL
ALBUMIN: 4 g/dL (ref 3.5–5.2)
ALK PHOS: 42 U/L (ref 39–117)
ALT: 18 U/L (ref 0–35)
AST: 21 U/L (ref 0–37)
BUN: 12 mg/dL (ref 6–23)
CO2: 25 mEq/L (ref 19–32)
CREATININE: 0.75 mg/dL (ref 0.40–1.20)
Calcium: 9.6 mg/dL (ref 8.4–10.5)
Chloride: 105 mEq/L (ref 96–112)
GFR: 77.9 mL/min (ref >60.00)
GLUCOSE: 100 mg/dL — AB (ref 70–99)
Potassium: 4 mEq/L (ref 3.5–5.1)
Sodium: 140 mEq/L (ref 135–145)
TOTAL PROTEIN: 6.8 g/dL (ref 6.0–8.3)
Total Bilirubin: 0.4 mg/dL (ref 0.2–1.2)

## 2018-02-08 LAB — TSH: TSH: 4.98 u[IU]/mL — AB (ref 0.35–4.50)

## 2018-02-08 MED ORDER — VALACYCLOVIR HCL 1 G PO TABS: 1000.0000 mg | ORAL_TABLET | Freq: Three times a day (TID) | ORAL | 1 refills | Status: DC

## 2018-02-08 MED ORDER — ESTROGENS, CONJUGATED 0.625 MG/GM VA CREA: TOPICAL_CREAM | VAGINAL | 11 refills | Status: DC

## 2018-02-08 MED ORDER — ZOSTER VAC RECOMB ADJUVANTED 50 MCG/0.5ML IM SUSR: 0.5000 mL | Freq: Once | INTRAMUSCULAR | 1 refills | Status: AC

## 2018-02-08 MED ORDER — MELOXICAM 15 MG PO TABS: 15.0000 mg | ORAL_TABLET | Freq: Every day | ORAL | 3 refills | Status: DC

## 2018-02-08 MED ORDER — NEXIUM 40 MG PO CPDR: DELAYED_RELEASE_CAPSULE | ORAL | 11 refills | Status: DC

## 2018-02-08 NOTE — Assessment & Plan Note (Signed)
Managed with vaginal estrogen.  She has had no bleeding

## 2018-02-08 NOTE — Patient Instructions (Signed)
Health Maintenance for Postmenopausal Women Menopause is a normal process in which your reproductive ability comes to an end. This process happens gradually over a span of months to years, usually between the ages of 62 and 89. Menopause is complete when you have missed 12 consecutive menstrual periods. It is important to talk with your health care provider about some of the most common conditions that affect postmenopausal women, such as heart disease, cancer, and bone loss (osteoporosis). Adopting a healthy lifestyle and getting preventive care can help to promote your health and wellness. Those actions can also lower your chances of developing some of these common conditions. What should I know about menopause? During menopause, you may experience a number of symptoms, such as:  Moderate-to-severe hot flashes.  Night sweats.  Decrease in sex drive.  Mood swings.  Headaches.  Tiredness.  Irritability.  Memory problems.  Insomnia. Choosing to treat or not to treat menopausal changes is an individual decision that you make with your health care provider. What should I know about hormone replacement therapy and supplements? Hormone therapy products are effective for treating symptoms that are associated with menopause, such as hot flashes and night sweats. Hormone replacement carries certain risks, especially as you become older. If you are thinking about using estrogen or estrogen with progestin treatments, discuss the benefits and risks with your health care provider. What should I know about heart disease and stroke? Heart disease, heart attack, and stroke become more likely as you age. This may be due, in part, to the hormonal changes that your body experiences during menopause. These can affect how your body processes dietary fats, triglycerides, and cholesterol. Heart attack and stroke are both medical emergencies. There are many things that you can do to help prevent heart disease  and stroke:  Have your blood pressure checked at least every 1-2 years. High blood pressure causes heart disease and increases the risk of stroke.  If you are 79-72 years old, ask your health care provider if you should take aspirin to prevent a heart attack or a stroke.  Do not use any tobacco products, including cigarettes, chewing tobacco, or electronic cigarettes. If you need help quitting, ask your health care provider.  It is important to eat a healthy diet and maintain a healthy weight. ? Be sure to include plenty of vegetables, fruits, low-fat dairy products, and lean protein. ? Avoid eating foods that are high in solid fats, added sugars, or salt (sodium).  Get regular exercise. This is one of the most important things that you can do for your health. ? Try to exercise for at least 150 minutes each week. The type of exercise that you do should increase your heart rate and make you sweat. This is known as moderate-intensity exercise. ? Try to do strengthening exercises at least twice each week. Do these in addition to the moderate-intensity exercise.  Know your numbers.Ask your health care provider to check your cholesterol and your blood glucose. Continue to have your blood tested as directed by your health care provider.  What should I know about cancer screening? There are several types of cancer. Take the following steps to reduce your risk and to catch any cancer development as early as possible. Breast Cancer  Practice breast self-awareness. ? This means understanding how your breasts normally appear and feel. ? It also means doing regular breast self-exams. Let your health care provider know about any changes, no matter how small.  If you are 40 or  older, have a clinician do a breast exam (clinical breast exam or CBE) every year. Depending on your age, family history, and medical history, it may be recommended that you also have a yearly breast X-ray (mammogram).  If you  have a family history of breast cancer, talk with your health care provider about genetic screening.  If you are at high risk for breast cancer, talk with your health care provider about having an MRI and a mammogram every year.  Breast cancer (BRCA) gene test is recommended for women who have family members with BRCA-related cancers. Results of the assessment will determine the need for genetic counseling and BRCA1 and for BRCA2 testing. BRCA-related cancers include these types: ? Breast. This occurs in males or females. ? Ovarian. ? Tubal. This may also be called fallopian tube cancer. ? Cancer of the abdominal or pelvic lining (peritoneal cancer). ? Prostate. ? Pancreatic. Cervical, Uterine, and Ovarian Cancer Your health care provider may recommend that you be screened regularly for cancer of the pelvic organs. These include your ovaries, uterus, and vagina. This screening involves a pelvic exam, which includes checking for microscopic changes to the surface of your cervix (Pap test).  For women ages 21-65, health care providers may recommend a pelvic exam and a Pap test every three years. For women ages 39-65, they may recommend the Pap test and pelvic exam, combined with testing for human papilloma virus (HPV), every five years. Some types of HPV increase your risk of cervical cancer. Testing for HPV may also be done on women of any age who have unclear Pap test results.  Other health care providers may not recommend any screening for nonpregnant women who are considered low risk for pelvic cancer and have no symptoms. Ask your health care provider if a screening pelvic exam is right for you.  If you have had past treatment for cervical cancer or a condition that could lead to cancer, you need Pap tests and screening for cancer for at least 20 years after your treatment. If Pap tests have been discontinued for you, your risk factors (such as having a new sexual partner) need to be reassessed  to determine if you should start having screenings again. Some women have medical problems that increase the chance of getting cervical cancer. In these cases, your health care provider may recommend that you have screening and Pap tests more often.  If you have a family history of uterine cancer or ovarian cancer, talk with your health care provider about genetic screening.  If you have vaginal bleeding after reaching menopause, tell your health care provider.  There are currently no reliable tests available to screen for ovarian cancer. Lung Cancer Lung cancer screening is recommended for adults 57-50 years old who are at high risk for lung cancer because of a history of smoking. A yearly low-dose CT scan of the lungs is recommended if you:  Currently smoke.  Have a history of at least 30 pack-years of smoking and you currently smoke or have quit within the past 15 years. A pack-year is smoking an average of one pack of cigarettes per day for one year. Yearly screening should:  Continue until it has been 15 years since you quit.  Stop if you develop a health problem that would prevent you from having lung cancer treatment. Colorectal Cancer  This type of cancer can be detected and can often be prevented.  Routine colorectal cancer screening usually begins at age 12 and continues through  age 75.  If you have risk factors for colon cancer, your health care provider may recommend that you be screened at an earlier age.  If you have a family history of colorectal cancer, talk with your health care provider about genetic screening.  Your health care provider may also recommend using home test kits to check for hidden blood in your stool.  A small camera at the end of a tube can be used to examine your colon directly (sigmoidoscopy or colonoscopy). This is done to check for the earliest forms of colorectal cancer.  Direct examination of the colon should be repeated every 5-10 years until  age 75. However, if early forms of precancerous polyps or small growths are found or if you have a family history or genetic risk for colorectal cancer, you may need to be screened more often. Skin Cancer  Check your skin from head to toe regularly.  Monitor any moles. Be sure to tell your health care provider: ? About any new moles or changes in moles, especially if there is a change in a mole's shape or color. ? If you have a mole that is larger than the size of a pencil eraser.  If any of your family members has a history of skin cancer, especially at a young age, talk with your health care provider about genetic screening.  Always use sunscreen. Apply sunscreen liberally and repeatedly throughout the day.  Whenever you are outside, protect yourself by wearing long sleeves, pants, a wide-brimmed hat, and sunglasses. What should I know about osteoporosis? Osteoporosis is a condition in which bone destruction happens more quickly than new bone creation. After menopause, you may be at an increased risk for osteoporosis. To help prevent osteoporosis or the bone fractures that can happen because of osteoporosis, the following is recommended:  If you are 19-50 years old, get at least 1,000 mg of calcium and at least 600 mg of vitamin D per day.  If you are older than age 50 but younger than age 70, get at least 1,200 mg of calcium and at least 600 mg of vitamin D per day.  If you are older than age 70, get at least 1,200 mg of calcium and at least 800 mg of vitamin D per day. Smoking and excessive alcohol intake increase the risk of osteoporosis. Eat foods that are rich in calcium and vitamin D, and do weight-bearing exercises several times each week as directed by your health care provider. What should I know about how menopause affects my mental health? Depression may occur at any age, but it is more common as you become older. Common symptoms of depression include:  Low or sad  mood.  Changes in sleep patterns.  Changes in appetite or eating patterns.  Feeling an overall lack of motivation or enjoyment of activities that you previously enjoyed.  Frequent crying spells. Talk with your health care provider if you think that you are experiencing depression. What should I know about immunizations? It is important that you get and maintain your immunizations. These include:  Tetanus, diphtheria, and pertussis (Tdap) booster vaccine.  Influenza every year before the flu season begins.  Pneumonia vaccine.  Shingles vaccine. Your health care provider may also recommend other immunizations. This information is not intended to replace advice given to you by your health care provider. Make sure you discuss any questions you have with your health care provider. Document Released: 02/14/2005 Document Revised: 07/13/2015 Document Reviewed: 09/26/2014 Elsevier Interactive Patient Education    2019 [place].

## 2018-02-08 NOTE — Assessment & Plan Note (Signed)
EMG./[place] studies confirm  CTS as well as a radial sensory neuropathy.  Neurology managing with nocturnal braces and periodic injections.

## 2018-02-08 NOTE — Assessment & Plan Note (Signed)
Her 10 yr risk of CAD is < 8%    And she has  Lab Results  Component Value Date   CHOL 248 (H) 02/08/2018   HDL 60.00 02/08/2018   LDLCALC 155 (H) 02/08/2018   LDLDIRECT 123.0 02/02/2015   TRIG 166.0 (H) 02/08/2018   CHOLHDL 4 02/08/2018

## 2018-02-08 NOTE — Progress Notes (Signed)
Patient ID: Kathleen Russell, female    DOB: [birth year]  Age: 63 y.o. MRN: [id number]  The patient is here for annual preventive  examination and management of other chronic and acute problems.  Mammogram normal Nov 2019 Normal PAP 2018 Colonoscopy April 2018: normal [place],   The risk factors are reflected in the social history.  The roster of all physicians providing medical care to patient - is listed in the Snapshot section of the chart.  Activities of daily living:  The patient is 100% independent in all ADLs: dressing, toileting, feeding as well as independent mobility. Patient works full time in an [employer] : The patient has smoke detectors in the home. They wear seatbelts.  There are no firearms at home. There is no violence in the home.   There is no risks for hepatitis, STDs or HIV. There is no   history of blood transfusion. They have no travel history to infectious disease endemic areas of the world.  The patient has seen their dentist in the last six month. They have seen their eye doctor in the last year. They admit to slight hearing difficulty with regard to whispered voices and some television programs.  They have deferred audiologic testing in the last year.  They do not  have excessive sun exposure. Discussed the need for sun protection: hats, long sleeves and use of sunscreen if there is significant sun exposure.   Diet: the importance of a healthy diet is discussed. They do have a healthy diet.  The benefits of regular aerobic exercise were discussed. She walks 4 times per week ,  20 minutes.   Depression screen: there are no signs or vegative symptoms of depression- irritability, change in appetite, anhedonia, sadness/tearfullness.  Cognitive assessment: the patient manages all their financial and personal affairs and is actively engaged. They could relate day,date,year and events; recalled 2/3 objects at 3 minutes; performed clock-face test  normally.  The following portions of the patient's history were reviewed and updated as appropriate: allergies, current medications, past family history, past medical history,  past surgical history, past social history  and problem list.  Visual acuity was not assessed per patient preference since she has regular follow up with her ophthalmologist. Hearing and body mass index were assessed and reviewed.   During the course of the visit the patient was educated and counseled about appropriate screening and preventive services including : fall prevention , diabetes screening, nutrition counseling, colorectal cancer screening, and recommended immunizations.    CC: The primary encounter diagnosis was Change in bowel habits. Diagnoses of Hyperlipidemia LDL goal <160, Obesity (BMI 30-39.9), Impaired fasting glucose, Recurrent sinusitis, Carpal tunnel syndrome on both sides, Postmenopause atrophic vaginitis, and Visit for preventive health examination were also pertinent to this visit.  1) recurrent sinusitis since November,  Treated with steroids initially but has had  4 rounds of antibiotics and steroids by  Kathleen Russell,  ENT with plans for CT scan and possible surgery.  Taking culturelle and [place] yogurt   2) wrist pain , bilateral: Kathleen Russell for CTS  And radial sensory neuropathy,  Managed with periodic injections , has been sleeping with wrist in left brace .  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   She has a past surgical history that includes Tonsillectomy and adenoidectomy; Abdominal hysterectomy; Eye surgery; Cesarean section; Total abdominal hysterectomy w/ bilateral salpingoophorectomy  (2000); ORIF ankle fracture (1980); and Colonoscopy with propofol (N/A, 04/09/2016).   Her family history includes Alcohol abuse in her maternal uncle; Arthritis in her mother; Breast cancer in her maternal aunt; Cancer in her paternal uncle; Diabetes in her father, mother, paternal aunt, paternal uncle, and sister; Heart disease in her paternal uncle; Hyperlipidemia in her father; Hypertension in her paternal uncle.She reports that she has never smoked. She has never used smokeless tobacco. She reports current alcohol use. She reports that she does not use drugs.  Outpatient Medications Prior to Visit  Medication Sig Dispense Refill  . Biotin 1000 MCG tablet Take 1,000 mcg by mouth 3 (three) times daily.     . cetirizine (ZYRTEC) 10 MG tablet Take 10 mg by mouth daily.    . fluticasone (FLONASE) 50 MCG/ACT nasal spray SHAKE LIQUID AND USE 2 SPRAYS IN EACH NOSTRIL AT BEDTIME AS NEEDED 16 g 5  . Multiple Vitamin (MULTIVITAMIN) tablet Take 1 tablet by mouth daily.    [place] conjugated estrogens (PREMARIN) vaginal cream Place vaginally 3 (three) times a week. 30 g 11  . meloxicam (MOBIC) 15 MG tablet Take 1 tablet (15 mg total) by mouth daily. As needed for joint pain 30 tablet 3  . NEXIUM 40 MG capsule TAKE 1 CAPSULE EVERY MORNING BEFORE BREAKFAST 30 capsule 11  . valACYclovir (VALTREX) 1000 MG tablet Take 1 tablet (1,000 mg total) by mouth 3 (three) times daily. KEEP ON FILE FOR FUTURE REFILLS 21 tablet 1  . clindamycin (CLEOCIN) 300 MG capsule TK 1 C PO TID FOR 2 WKS    . NIFEdipine (PROCARDIA-XL/ADALAT-CC/NIFEDICAL-XL) 30 MG 24 hr tablet Take 1 tablet (30 mg total) by mouth daily. (Patient not taking: Reported on 02/08/2018) 30 tablet 2  . pantoprazole (PROTONIX) 40 MG tablet Take 1 tablet (40 mg total) by mouth daily. (Patient not taking: Reported on 02/08/2018) 30 tablet 3  . XIIDRA 5 % SOLN Place 1 drop into both eyes 2 (two) times daily.  5   No facility-administered medications prior to visit.      Review of Systems   Patient denies headache, fevers, malaise, unintentional weight loss, skin rash, eye pain, sinus congestion and sinus pain, sore throat, dysphagia,  hemoptysis , cough, dyspnea, wheezing, chest pain, palpitations, orthopnea, edema, abdominal pain, nausea, melena, diarrhea, constipation, flank pain, dysuria, hematuria, urinary  Frequency, nocturia, numbness, tingling, seizures,  Focal weakness, Loss of consciousness,  Tremor, insomnia, depression, anxiety, and suicidal ideation.     Objective:  BP 126/82 (BP Location: Left Arm, Patient Position: Sitting, Cuff Size: Large)   Pulse 77   Temp 97.7 F (36.5 C) (Oral)   Resp 14   Ht 5\' 3"  (1.6 m)   Wt 190 lb 9.6 oz (86.5 kg)   SpO2 97%   BMI 33.76 kg/m   Physical Exam   General appearance: alert, cooperative and appears stated age Head: Normocephalic, without obvious abnormality, atraumatic Eyes: conjunctivae/corneas clear. PERRL, EOM's intact. Fundi benign. Ears: normal TM's and external ear canals both ears Nose: Nares normal. Septum midline. Mucosa normal. No drainage or sinus tenderness. Throat: lips, mucosa, and tongue normal; teeth and gums normal Neck: no adenopathy, no carotid bruit, no JVD, supple, symmetrical, trachea midline and thyroid not enlarged, symmetric, no tenderness/mass/nodules Lungs: clear to auscultation bilaterally Breasts: normal  appearance, no masses or tenderness Heart: regular rate and rhythm, S1, S2 normal, no murmur, click, rub or gallop Abdomen: soft, non-tender; bowel sounds normal; no masses,  no organomegaly Extremities: extremities normal, atraumatic, no cyanosis or edema Pulses: 2+ and symmetric Skin:  Sun damage noted from tanning , texture, turgor normal. No rashes or lesions Neurologic: Alert and oriented X 3, normal strength and tone. Normal symmetric reflexes. Normal coordination and gait.     Assessment & Plan:   Problem List Items Addressed This Visit    Carpal  tunnel syndrome on both sides    EMG./[place] studies confirm  CTS as well as a radial sensory neuropathy.  Neurology managing with nocturnal braces and periodic injections.       RESOLVED: Change in bowel habits - Primary   Hyperlipidemia LDL goal <160     Her 10 yr risk of CAD is < 8%    And she has  Lab Results  Component Value Date   CHOL 248 (H) 02/08/2018   HDL 60.00 02/08/2018   LDLCALC 155 (H) 02/08/2018   LDLDIRECT 123.0 02/02/2015   TRIG 166.0 (H) 02/08/2018   CHOLHDL 4 02/08/2018         Relevant Orders   Lipid panel (Completed)   Obesity (BMI 30-39.9)    She has been treated with empiric antibiotics, prednisone taper, decongestants, and saline lavage 4 times and is awaiting ENT decision on surgery . She has gained weight du to inactivity      Relevant Orders   TSH (Completed)   Postmenopause atrophic vaginitis    Managed with vaginal estrogen.  She has had no bleeding       Visit for preventive health examination    age appropriate education and counseling updated, referrals for preventative services and immunizations addressed, dietary and smoking counseling addressed, most recent labs reviewed.  I have personally reviewed and have noted:  1) the patient's medical and social history 2) The pt's use of alcohol, tobacco, and illicit drugs 3) The patient's current medications and supplements 4) Functional ability including ADL's, fall risk, home safety risk, hearing and visual impairment 5) Diet and physical activities 6) Evidence for depression or mood disorder 7) The patient's height, weight, and BMI have been recorded in the chart  I have made referrals, and provided counseling and education based on review of the above       Other Visit Diagnoses    Impaired fasting glucose       Relevant Orders   Comprehensive metabolic panel (Completed)   POCT HgB A1C (Completed)   Recurrent sinusitis       Relevant Medications   clindamycin (CLEOCIN) 300 MG capsule    valACYclovir (VALTREX) 1000 MG tablet   Other Relevant Orders   CBC w/Diff      I have discontinued Kathleen Russell "CINDY"'s XIIDRA, NIFEdipine, and pantoprazole. I am also having her start on Zoster Vaccine Adjuvanted. Additionally, I am having her maintain her cetirizine, multivitamin, Biotin, fluticasone, clindamycin, conjugated estrogens, meloxicam, valACYclovir, and NEXIUM.  Meds ordered this encounter  Medications  . conjugated estrogens (PREMARIN) vaginal cream    Sig: Place vaginally 3 (three) times a week.    Dispense:  30 g    Refill:  11  . meloxicam (MOBIC) 15 MG tablet    Sig: Take 1 tablet (15 mg total) by mouth daily. As needed for joint pain    Dispense:  30 tablet    Refill:  3  .  valACYclovir (VALTREX) 1000 MG tablet    Sig: Take 1 tablet (1,000 mg total) by mouth 3 (three) times daily. KEEP ON FILE FOR FUTURE REFILLS    Dispense:  21 tablet    Refill:  1  . NEXIUM 40 MG capsule    Sig: TAKE 1 CAPSULE EVERY MORNING BEFORE BREAKFAST    Dispense:  30 capsule    Refill:  11  . Zoster Vaccine Adjuvanted [facility]) injection    Sig: Inject 0.5 mLs into the muscle once for 1 dose.    Dispense:  1 each    Refill:  1    Medications Discontinued During This Encounter  Medication Reason  . NIFEdipine (PROCARDIA-XL/ADALAT-CC/NIFEDICAL-XL) 30 MG 24 hr tablet Patient has not taken in last 30 days  . pantoprazole (PROTONIX) 40 MG tablet Patient has not taken in last 30 days  . XIIDRA 5 % SOLN Patient has not taken in last 30 days  . conjugated estrogens (PREMARIN) vaginal cream Reorder  . meloxicam (MOBIC) 15 MG tablet Reorder  . valACYclovir (VALTREX) 1000 MG tablet Reorder  . NEXIUM 40 MG capsule Reorder    Follow-up: No follow-ups on file.   Crecencio Mc, MD

## 2018-02-08 NOTE — Assessment & Plan Note (Signed)

## 2018-02-08 NOTE — Assessment & Plan Note (Addendum)
She has been treated with empiric antibiotics, prednisone taper, decongestants, and saline lavage 4 times and is awaiting ENT decision on surgery . She has gained weight du to inactivity

## 2018-02-09 NOTE — Addendum Note (Signed)
Addended by: Crecencio Mc on: 02/09/2018 10:48 PM   Modules accepted: Orders

## 2018-02-15 DIAGNOSIS — J45991 Cough variant asthma: Secondary | ICD-10-CM | POA: Diagnosis not present

## 2018-02-15 DIAGNOSIS — J329 Chronic sinusitis, unspecified: Secondary | ICD-10-CM | POA: Diagnosis not present

## 2018-04-01 ENCOUNTER — Other Ambulatory Visit: Payer: Self-pay | Admitting: Internal Medicine

## 2018-04-14 ENCOUNTER — Other Ambulatory Visit: Payer: Self-pay | Admitting: Internal Medicine

## 2018-06-25 ENCOUNTER — Encounter: Payer: Self-pay | Admitting: Emergency Medicine

## 2018-06-25 ENCOUNTER — Other Ambulatory Visit: Payer: Self-pay

## 2018-06-25 DIAGNOSIS — E669 Obesity, unspecified: Secondary | ICD-10-CM | POA: Insufficient documentation

## 2018-06-25 DIAGNOSIS — I7300 Raynaud's syndrome without gangrene: Secondary | ICD-10-CM | POA: Insufficient documentation

## 2018-06-25 DIAGNOSIS — Z87440 Personal history of urinary (tract) infections: Secondary | ICD-10-CM | POA: Insufficient documentation

## 2018-06-25 DIAGNOSIS — K8020 Calculus of gallbladder without cholecystitis without obstruction: Secondary | ICD-10-CM | POA: Diagnosis present

## 2018-06-25 DIAGNOSIS — K8012 Calculus of gallbladder with acute and chronic cholecystitis without obstruction: Secondary | ICD-10-CM | POA: Diagnosis not present

## 2018-06-25 DIAGNOSIS — K828 Other specified diseases of gallbladder: Secondary | ICD-10-CM | POA: Diagnosis not present

## 2018-06-25 DIAGNOSIS — Z7901 Long term (current) use of anticoagulants: Secondary | ICD-10-CM | POA: Diagnosis not present

## 2018-06-25 DIAGNOSIS — R9431 Abnormal electrocardiogram [ECG] [EKG]: Secondary | ICD-10-CM | POA: Insufficient documentation

## 2018-06-25 DIAGNOSIS — Z6832 Body mass index (BMI) 32.0-32.9, adult: Secondary | ICD-10-CM | POA: Diagnosis not present

## 2018-06-25 DIAGNOSIS — Z87442 Personal history of urinary calculi: Secondary | ICD-10-CM | POA: Insufficient documentation

## 2018-06-25 DIAGNOSIS — Z7951 Long term (current) use of inhaled steroids: Secondary | ICD-10-CM | POA: Insufficient documentation

## 2018-06-25 DIAGNOSIS — G5600 Carpal tunnel syndrome, unspecified upper limb: Secondary | ICD-10-CM | POA: Insufficient documentation

## 2018-06-25 DIAGNOSIS — Z791 Long term (current) use of non-steroidal anti-inflammatories (NSAID): Secondary | ICD-10-CM | POA: Diagnosis not present

## 2018-06-25 DIAGNOSIS — Z79899 Other long term (current) drug therapy: Secondary | ICD-10-CM | POA: Insufficient documentation

## 2018-06-25 DIAGNOSIS — Z90710 Acquired absence of both cervix and uterus: Secondary | ICD-10-CM | POA: Diagnosis not present

## 2018-06-25 DIAGNOSIS — E785 Hyperlipidemia, unspecified: Secondary | ICD-10-CM | POA: Insufficient documentation

## 2018-06-25 DIAGNOSIS — R1011 Right upper quadrant pain: Secondary | ICD-10-CM | POA: Diagnosis present

## 2018-06-25 DIAGNOSIS — K219 Gastro-esophageal reflux disease without esophagitis: Secondary | ICD-10-CM | POA: Insufficient documentation

## 2018-06-25 DIAGNOSIS — Z1159 Encounter for screening for other viral diseases: Secondary | ICD-10-CM | POA: Diagnosis not present

## 2018-06-25 LAB — TROPONIN I: Troponin I: <0.03 ng/mL (ref <0.03)

## 2018-06-25 LAB — URINALYSIS, COMPLETE (UACMP) WITH MICROSCOPIC
Bacteria, UA: NONE SEEN
Bilirubin Urine: NEGATIVE
Glucose, UA: NEGATIVE mg/dL
Hgb urine dipstick: NEGATIVE
Ketones, ur: NEGATIVE mg/dL
Leukocytes,Ua: NEGATIVE
Nitrite: NEGATIVE
Protein, ur: NEGATIVE mg/dL
Specific Gravity, Urine: 1.026 (ref 1.005–1.030)
pH: 5 (ref 5.0–8.0)

## 2018-06-25 LAB — COMPREHENSIVE METABOLIC PANEL
ALT: 20 U/L (ref 0–44)
AST: 24 U/L (ref 15–41)
Albumin: 4.6 g/dL (ref 3.5–5.0)
Alkaline Phosphatase: 66 U/L (ref 38–126)
Anion gap: 14 (ref 5–15)
BUN: 16 mg/dL (ref 8–23)
CO2: 22 mmol/L (ref 22–32)
Calcium: 9.3 mg/dL (ref 8.9–10.3)
Chloride: 105 mmol/L (ref 98–111)
Creatinine, Ser: 0.77 mg/dL (ref 0.44–1.00)
GFR calc Af Amer: >60 mL/min (ref >60)
GFR calc non Af Amer: >60 mL/min (ref >60)
Glucose, Bld: 115 mg/dL — ABNORMAL HIGH (ref 70–99)
Potassium: 3.9 mmol/L (ref 3.5–5.1)
Sodium: 141 mmol/L (ref 135–145)
Total Bilirubin: 0.7 mg/dL (ref 0.3–1.2)
Total Protein: 7.7 g/dL (ref 6.5–8.1)

## 2018-06-25 LAB — CBC
HCT: 44.8 % (ref 36.0–46.0)
Hemoglobin: 14.5 g/dL (ref 12.0–15.0)
MCH: 28 pg (ref 26.0–34.0)
MCHC: 32.4 g/dL (ref 30.0–36.0)
MCV: 86.7 fL (ref 80.0–100.0)
Platelets: 238 10*3/uL (ref 150–400)
RBC: 5.17 MIL/uL — ABNORMAL HIGH (ref 3.87–5.11)
RDW: 12.7 % (ref 11.5–15.5)
WBC: 10.8 10*3/uL — ABNORMAL HIGH (ref 4.0–10.5)
nRBC: 0 % (ref 0.0–0.2)

## 2018-06-25 LAB — LIPASE, BLOOD: Lipase: 35 U/L (ref 11–51)

## 2018-06-25 NOTE — ED Triage Notes (Signed)
Here for acute onset pain under right scapula radiating around to right side.  Pt did not eat prior to this pain but does have hx of gallstones.  + nausea.  +diaphoresis at time of onset.  Pain has eased per pt but still having.  No pain in chest. No SHOB. No cough. No fevers.

## 2018-06-26 ENCOUNTER — Other Ambulatory Visit: Payer: Self-pay

## 2018-06-26 ENCOUNTER — Observation Stay: Payer: BLUE CROSS/BLUE SHIELD | Admitting: Anesthesiology

## 2018-06-26 ENCOUNTER — Emergency Department: Payer: BLUE CROSS/BLUE SHIELD

## 2018-06-26 ENCOUNTER — Observation Stay
Admission: EM | Admit: 2018-06-26 | Discharge: 2018-06-27 | Disposition: A | Discharge status: Home / Self Care | Payer: BLUE CROSS/BLUE SHIELD | Attending: General Surgery | Admitting: General Surgery

## 2018-06-26 ENCOUNTER — Encounter: Payer: Self-pay | Admitting: Anesthesiology

## 2018-06-26 ENCOUNTER — Encounter
Admission: EM | Disposition: A | Discharge status: Home / Self Care | Payer: Self-pay | Source: Home / Self Care | Attending: Emergency Medicine

## 2018-06-26 DIAGNOSIS — K8020 Calculus of gallbladder without cholecystitis without obstruction: Secondary | ICD-10-CM

## 2018-06-26 DIAGNOSIS — K810 Acute cholecystitis: Secondary | ICD-10-CM | POA: Diagnosis present

## 2018-06-26 DIAGNOSIS — R1011 Right upper quadrant pain: Secondary | ICD-10-CM

## 2018-06-26 HISTORY — PX: CHOLECYSTECTOMY: SHX55

## 2018-06-26 LAB — SURGICAL PCR SCREEN
MRSA, PCR: NEGATIVE
Staphylococcus aureus: POSITIVE — AB

## 2018-06-26 LAB — SARS CORONAVIRUS 2 BY RT PCR (HOSPITAL ORDER, PERFORMED IN ~~LOC~~ HOSPITAL LAB): SARS Coronavirus 2: NEGATIVE

## 2018-06-26 SURGERY — LAPAROSCOPIC CHOLECYSTECTOMY
Anesthesia: General

## 2018-06-26 MED ORDER — PROPOFOL 10 MG/ML IV BOLUS
INTRAVENOUS | Status: DC | PRN
  Administered 2018-06-26: 140 mg via INTRAVENOUS

## 2018-06-26 MED ORDER — ONDANSETRON HCL 4 MG/2ML IJ SOLN
INTRAMUSCULAR | Status: AC
  Filled 2018-06-26: qty 2

## 2018-06-26 MED ORDER — FENTANYL CITRATE (PF) 100 MCG/2ML IJ SOLN
25.0000 ug | INTRAMUSCULAR | Status: DC | PRN
  Administered 2018-06-26 (×4): 25 ug via INTRAVENOUS
  Administered 2018-06-26: 50 ug via INTRAVENOUS

## 2018-06-26 MED ORDER — SUGAMMADEX SODIUM 200 MG/2ML IV SOLN
INTRAVENOUS | Status: DC | PRN
  Administered 2018-06-26: 200 mg via INTRAVENOUS

## 2018-06-26 MED ORDER — SODIUM CHLORIDE 0.9 % IV SOLN
INTRAVENOUS | Status: DC
  Administered 2018-06-26 (×2): via INTRAVENOUS

## 2018-06-26 MED ORDER — ACETAMINOPHEN 325 MG PO TABS
650.0000 mg | ORAL_TABLET | Freq: Four times a day (QID) | ORAL | Status: DC | PRN
  Administered 2018-06-26: 650 mg via ORAL
  Filled 2018-06-26: qty 2

## 2018-06-26 MED ORDER — PIPERACILLIN-TAZOBACTAM 3.375 G IVPB
3.3750 g | Freq: Three times a day (TID) | INTRAVENOUS | Status: DC
  Administered 2018-06-26 – 2018-06-27 (×3): 3.375 g via INTRAVENOUS
  Filled 2018-06-26 (×7): qty 50

## 2018-06-26 MED ORDER — MORPHINE SULFATE (PF) 4 MG/ML IV SOLN: 4.0000 mg | INTRAVENOUS | Status: DC | PRN

## 2018-06-26 MED ORDER — FENTANYL CITRATE (PF) 100 MCG/2ML IJ SOLN
INTRAMUSCULAR | Status: AC
  Filled 2018-06-26: qty 2

## 2018-06-26 MED ORDER — ONDANSETRON HCL 4 MG/2ML IJ SOLN: 4.0000 mg | Freq: Four times a day (QID) | INTRAMUSCULAR | Status: DC | PRN

## 2018-06-26 MED ORDER — LACTATED RINGERS IV SOLN
INTRAVENOUS | Status: DC | PRN
  Administered 2018-06-26: 18:00:00 via INTRAVENOUS

## 2018-06-26 MED ORDER — SODIUM CHLORIDE 0.9 % IV BOLUS
1000.0000 mL | Freq: Once | INTRAVENOUS | Status: AC
  Administered 2018-06-26: 1000 mL via INTRAVENOUS

## 2018-06-26 MED ORDER — ENOXAPARIN SODIUM 40 MG/0.4ML ~~LOC~~ SOLN
40.0000 mg | SUBCUTANEOUS | Status: DC
  Filled 2018-06-26: qty 0.4

## 2018-06-26 MED ORDER — LIDOCAINE HCL (CARDIAC) PF 100 MG/5ML IV SOSY
PREFILLED_SYRINGE | INTRAVENOUS | Status: DC | PRN
  Administered 2018-06-26: 100 mg via INTRAVENOUS

## 2018-06-26 MED ORDER — ROCURONIUM BROMIDE 100 MG/10ML IV SOLN
INTRAVENOUS | Status: DC | PRN
  Administered 2018-06-26: 40 mg via INTRAVENOUS

## 2018-06-26 MED ORDER — MORPHINE SULFATE (PF) 2 MG/ML IV SOLN
2.0000 mg | Freq: Once | INTRAVENOUS | Status: AC
  Administered 2018-06-26: 2 mg via INTRAVENOUS
  Filled 2018-06-26: qty 1

## 2018-06-26 MED ORDER — ONDANSETRON 4 MG PO TBDP: 4.0000 mg | ORAL_TABLET | Freq: Four times a day (QID) | ORAL | Status: DC | PRN

## 2018-06-26 MED ORDER — ALBUTEROL SULFATE (2.5 MG/3ML) 0.083% IN NEBU: 3.0000 mL | INHALATION_SOLUTION | Freq: Four times a day (QID) | RESPIRATORY_TRACT | Status: DC | PRN

## 2018-06-26 MED ORDER — ONDANSETRON HCL 4 MG/2ML IJ SOLN
4.0000 mg | Freq: Once | INTRAMUSCULAR | Status: AC | PRN
  Administered 2018-06-26: 4 mg via INTRAVENOUS

## 2018-06-26 MED ORDER — MORPHINE SULFATE (PF) 2 MG/ML IV SOLN
2.0000 mg | Freq: Once | INTRAVENOUS | Status: AC
  Administered 2018-06-26: 2 mg via INTRAVENOUS

## 2018-06-26 MED ORDER — ACETAMINOPHEN 650 MG RE SUPP
650.0000 mg | Freq: Four times a day (QID) | RECTAL | Status: DC | PRN
  Filled 2018-06-26: qty 1

## 2018-06-26 MED ORDER — SUCCINYLCHOLINE CHLORIDE 20 MG/ML IJ SOLN
INTRAMUSCULAR | Status: DC | PRN
  Administered 2018-06-26: 100 mg via INTRAVENOUS

## 2018-06-26 MED ORDER — ONDANSETRON HCL 4 MG/2ML IJ SOLN
4.0000 mg | Freq: Once | INTRAMUSCULAR | Status: AC
  Administered 2018-06-26: 4 mg via INTRAVENOUS

## 2018-06-26 MED ORDER — PANTOPRAZOLE SODIUM 40 MG IV SOLR
40.0000 mg | Freq: Every day | INTRAVENOUS | Status: DC
  Administered 2018-06-26: 40 mg via INTRAVENOUS
  Filled 2018-06-26 (×2): qty 40

## 2018-06-26 MED ORDER — FENTANYL CITRATE (PF) 100 MCG/2ML IJ SOLN
INTRAMUSCULAR | Status: DC | PRN
  Administered 2018-06-26 (×2): 50 ug via INTRAVENOUS

## 2018-06-26 MED ORDER — HYDROCODONE-ACETAMINOPHEN 5-325 MG PO TABS
1.0000 | ORAL_TABLET | ORAL | Status: DC | PRN
  Administered 2018-06-26: 1 via ORAL
  Filled 2018-06-26: qty 1

## 2018-06-26 MED ORDER — MORPHINE SULFATE (PF) 2 MG/ML IV SOLN
INTRAVENOUS | Status: AC
  Filled 2018-06-26: qty 1

## 2018-06-26 MED ORDER — BUPIVACAINE-EPINEPHRINE (PF) 0.5% -1:200000 IJ SOLN
INTRAMUSCULAR | Status: AC
  Filled 2018-06-26: qty 30

## 2018-06-26 SURGICAL SUPPLY — 37 items
APPLIER CLIP 5 13 M/L LIGAMAX5 (MISCELLANEOUS)
APPLIER CLIP LOGIC TI 5 (MISCELLANEOUS) ×2 IMPLANT
BLADE SURG SZ11 CARB STEEL (BLADE) ×2 IMPLANT
CANISTER SUCT 1200ML W/VALVE (MISCELLANEOUS) ×2 IMPLANT
CATH CHOLANG 76X19 KUMAR (CATHETERS) IMPLANT
CHLORAPREP W/TINT 26 (MISCELLANEOUS) ×2 IMPLANT
CLIP APPLIE 5 13 M/L LIGAMAX5 (MISCELLANEOUS) IMPLANT
COVER WAND RF STERILE (DRAPES) IMPLANT
DERMABOND ADVANCED (GAUZE/BANDAGES/DRESSINGS) ×1
DERMABOND ADVANCED .7 DNX12 (GAUZE/BANDAGES/DRESSINGS) ×1 IMPLANT
ELECT REM PT RETURN 9FT ADLT (ELECTROSURGICAL) ×2
ELECTRODE REM PT RTRN 9FT ADLT (ELECTROSURGICAL) ×1 IMPLANT
GLOVE BIO SURGEON STRL SZ 6.5 (GLOVE) ×6 IMPLANT
GLOVE INDICATOR 6.5 STRL GRN (GLOVE) ×2 IMPLANT
GOWN STRL REUS W/ TWL LRG LVL3 (GOWN DISPOSABLE) ×4 IMPLANT
GOWN STRL REUS W/TWL LRG LVL3 (GOWN DISPOSABLE) ×4
GRASPER SUT TROCAR 14GX15 (MISCELLANEOUS) IMPLANT
HEMOSTAT SURGICEL 2X3 (HEMOSTASIS) IMPLANT
IRRIGATION STRYKERFLOW (MISCELLANEOUS) ×1 IMPLANT
IRRIGATOR STRYKERFLOW (MISCELLANEOUS) ×2
IV NS 1000ML (IV SOLUTION) ×1
IV NS 1000ML BAXH (IV SOLUTION) ×1 IMPLANT
KIT TURNOVER KIT A (KITS) ×2 IMPLANT
LABEL OR SOLS (LABEL) ×2 IMPLANT
NEEDLE HYPO 25X1 1.5 SAFETY (NEEDLE) ×2 IMPLANT
NEEDLE INSUFFLATION 14GA 120MM (NEEDLE) ×2 IMPLANT
NS IRRIG 500ML POUR BTL (IV SOLUTION) ×2 IMPLANT
PACK LAP CHOLECYSTECTOMY (MISCELLANEOUS) ×2 IMPLANT
POUCH SPECIMEN RETRIEVAL 10MM (ENDOMECHANICALS) ×2 IMPLANT
SCISSORS METZENBAUM CVD 33 (INSTRUMENTS) ×2 IMPLANT
SET TUBE SMOKE EVAC HIGH FLOW (TUBING) ×2 IMPLANT
SLEEVE ENDOPATH XCEL 5M (ENDOMECHANICALS) ×4 IMPLANT
SUT MNCRL AB 4-0 PS2 18 (SUTURE) ×2 IMPLANT
SUT VIC AB 0 CT1 36 (SUTURE) IMPLANT
SUT VICRYL 0 AB UR-6 (SUTURE) ×2 IMPLANT
TROCAR XCEL NON-BLD 11X100MML (ENDOMECHANICALS) ×2 IMPLANT
TROCAR XCEL NON-BLD 5MMX100MML (ENDOMECHANICALS) ×2 IMPLANT

## 2018-06-26 NOTE — Op Note (Signed)
Preoperative diagnosis: Acute cholecystitis.  Postoperative diagnosis: Acute cholecystitis.  Procedure: Laparoscopic Cholecystectomy.   Anesthesia: GETA   Surgeon: Dr. Windell Moment  Wound Classification: Clean Contaminated  Indications: Patient is a 63 y.o. female developed right upper quadrant pain, nausea and /vomiting and on workup was found to have cholelithiasis with a normal common duct. Stone stuck on gallbladder neck. Pain did not improved with pain medication. Laparoscopic cholecystectomy was elected.  Findings: Acutely inflamed gallbladder.  Critical view of safety achieved Cystic duct and artery identified, ligated and divided Adequate hemostasis  Description of procedure: The patient was placed on the operating table in the supine position. General anesthesia was induced. A time-out was completed verifying correct patient, procedure, site, positioning, and implant(s) and/or special equipment prior to beginning this procedure. An orogastric tube was placed. The abdomen was prepped and draped in the usual sterile fashion.  An incision was made in a natural skin line above the umbilicus.  The fascia was elevated and the Veress needle inserted. Proper position was confirmed by aspiration and saline meniscus test.  The abdomen was insufflated with carbon dioxide to a pressure of 15 mmHg. The patient tolerated insufflation well. A 11-mm trocar was then inserted.  The laparoscope was inserted and the abdomen inspected. No injuries from initial trocar placement were noted. Additional trocars were then inserted in the following locations: a 5-mm trocar in the right epigastrium and two 5-mm trocars along the right costal margin. The abdomen was inspected and no abnormalities were found. The table was placed in the reverse Trendelenburg position with the right side up.  Filmy adhesions between the gallbladder and omentum, duodenum and transverse colon were lysed sharply. The dome of the  gallbladder was grasped with an atraumatic grasper passed through the lateral port and retracted over the dome of the liver. The infundibulum was also grasped with an atraumatic grasper through the midclavicular port and retracted toward the right lower quadrant. This maneuver exposed Calot's triangle. The peritoneum overlying the gallbladder infundibulum was then incised and the cystic duct and cystic artery identified and circumferentially dissected. Critical view of safety reviewed before ligating any structure. The cystic duct and cystic artery were then doubly clipped and divided close to the gallbladder.  The gallbladder was then dissected from its peritoneal attachments by electrocautery. Hemostasis was checked and the gallbladder and contained stones were removed using an endoscopic retrieval bag placed through the umbilical port. The gallbladder was passed off the table as a specimen. The gallbladder fossa was copiously irrigated with saline and hemostasis was obtained. There was no evidence of bleeding from the gallbladder fossa or cystic artery or leakage of the bile from the cystic duct stump. Secondary trocars were removed under direct vision. No bleeding was noted. The laparoscope was withdrawn and the umbilical trocar removed. The abdomen was allowed to collapse. The fascia of the 57mm trocar sites was closed with figure-of-eight 0 vicryl sutures. The skin was closed with subcuticular sutures of 4-0 monocryl and topical skin adhesive. The orogastric tube was removed.  The patient tolerated the procedure well and was taken to the postanesthesia care unit in stable condition.   Specimen: Gallbladder  Complications: None  EBL: 26mL
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
## 2018-06-26 NOTE — ED Notes (Signed)
Pt ambulatory to restroom with steady gait.

## 2018-06-26 NOTE — Anesthesia Procedure Notes (Signed)
Procedure Name: Intubation Performed by: Lesle Reek, CRNA Pre-anesthesia Checklist: Patient identified, Emergency Drugs available, Suction available, Patient being monitored and Timeout performed Patient Re-evaluated:Patient Re-evaluated prior to induction Oxygen Delivery Method: Circle system utilized Preoxygenation: Pre-oxygenation with 100% oxygen Induction Type: IV induction Laryngoscope Size: Mac and 4 Grade View: Grade II Tube type: Oral Tube size: 7.0 mm Number of attempts: 1 Airway Equipment and Method: Stylet Placement Confirmation: ETT inserted through vocal cords under direct vision,  positive ETCO2,  CO2 detector and breath sounds checked- equal and bilateral Secured at: 20 cm Tube secured with: Tape

## 2018-06-26 NOTE — ED Notes (Signed)
Care assumed. Pt given toothbrush and wipes to clean up. Given deodorant. Delay explained. Pt alert and oriented. NAD. Unlabored.  C/o headache at this time.
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
## 2018-06-26 NOTE — Transfer of Care (Signed)
Immediate Anesthesia Transfer of Care Note  Patient: Kathleen Russell  Procedure(s) Performed: LAPAROSCOPIC CHOLECYSTECTOMY no grams (N/A )  Patient Location: PACU  Anesthesia Type:General  Level of Consciousness: awake and alert   Airway & Oxygen Therapy: Patient Spontanous Breathing  Post-op Assessment: Report given to RN  Post vital signs: Reviewed and stable  Last Vitals:  Vitals Value Taken Time  BP 129/72 06/26/18 1946  Temp 36 C 06/26/18 1946  Pulse 88 06/26/18 1946  Resp    SpO2 99 % 06/26/18 1946    Last Pain:  Vitals:   06/26/18 1630  TempSrc: Oral  PainSc:          Complications: No apparent anesthesia complications

## 2018-06-26 NOTE — Progress Notes (Signed)
Called patient's husband, Ronalee Belts, and provided update on patient post surgery. All questions were answered.

## 2018-06-26 NOTE — Anesthesia Post-op Follow-up Note (Signed)
Anesthesia QCDR form completed.        

## 2018-06-26 NOTE — ED Notes (Signed)
Given pillow. Lights dimmed.
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
## 2018-06-27 ENCOUNTER — Encounter: Payer: Self-pay | Admitting: General Surgery

## 2018-06-27 MED ORDER — HYDROCODONE-ACETAMINOPHEN 5-325 MG PO TABS: 1.0000 | ORAL_TABLET | ORAL | 0 refills | Status: AC | PRN

## 2018-06-27 NOTE — Anesthesia Postprocedure Evaluation (Signed)
Anesthesia Post Note  Patient: Kathleen Russell  Procedure(s) Performed: LAPAROSCOPIC CHOLECYSTECTOMY no grams (N/A )  Patient location during evaluation: PACU Anesthesia Type: General Level of consciousness: awake and alert and oriented Pain management: pain level controlled Vital Signs Assessment: post-procedure vital signs reviewed and stable Respiratory status: spontaneous breathing Cardiovascular status: blood pressure returned to baseline Anesthetic complications: no     Last Vitals:  Vitals:   06/27/18 0005 06/27/18 0620  BP: 127/72 128/68  Pulse: 93 89  Resp: 20 20  Temp: 37.6 C 37.2 C  SpO2: 95% 98%    Last Pain:  Vitals:   06/27/18 0620  TempSrc: Oral  PainSc:                  Donnavin Vandenbrink

## 2018-06-27 NOTE — Discharge Instructions (Signed)

## 2018-06-27 NOTE — Discharge Summary (Signed)
  Patient ID: Kathleen Russell MRN: [id number] DOB/AGE: Nov 20, 1955 63 y.o.  Admit date: 06/26/2018 Discharge date: 06/27/2018   Discharge Diagnoses:  Active Problems:   Acute cholecystitis   Procedures: Laparoscopic cholecystectomy  Hospital Course: Patient admitted with acute cholecystitis.  Patient underwent laparoscopic cholecystectomy.  She tolerated the procedure well.  This morning patient with pain control.  Patient tolerated breakfast.  Patient ambulating and passing gas.  No problem with the wound. Physical Exam  Constitutional: She is oriented to person, place, and time and well-developed, well-nourished, and in no distress.  Cardiovascular: Normal rate and regular rhythm.  Pulmonary/Chest: Effort normal and breath sounds normal.  Abdominal: Soft. Bowel sounds are normal. She exhibits no distension. There is no abdominal tenderness.  Neurological: She is alert and oriented to person, place, and time.  Skin: Skin is warm.  Wounds are dry and clean   Consults: None  Disposition: Discharge disposition: 01-Home or Self Care       Discharge Instructions    Diet - low sodium heart healthy   Complete by: As directed    Increase activity slowly   Complete by: As directed      Allergies as of 06/27/2018   No Known Allergies     Medication List    TAKE these medications   Biotin 1000 MCG tablet Take 1,000 mcg by mouth 3 (three) times daily.   cetirizine 10 MG tablet Commonly known as: ZYRTEC Take 10 mg by mouth daily.   clindamycin 300 MG capsule Commonly known as: CLEOCIN TK 1 C PO TID FOR 2 WKS   fluticasone 50 MCG/ACT nasal spray Commonly known as: FLONASE SHAKE LIQUID AND USE 2 SPRAYS IN EACH NOSTRIL AT BEDTIME AS NEEDED   HYDROcodone-acetaminophen 5-325 MG tablet Commonly known as: Norco Take 1 tablet by mouth every 4 (four) hours as needed for up to 3 days for moderate pain.   meloxicam 15 MG tablet Commonly known as: MOBIC Take 1 tablet (15 mg  total) by mouth daily. As needed for joint pain   multivitamin tablet Take 1 tablet by mouth daily.   NexIUM 40 MG capsule Generic drug: esomeprazole TAKE 1 CAPSULE EVERY MORNING BEFORE BREAKFAST What changed:   how much to take  how to take this  when to take this   Premarin vaginal cream Generic drug: conjugated estrogens PLACE VAGINALLY 3 TIMES A WEEK   valACYclovir 1000 MG tablet Commonly known as: VALTREX Take 1 tablet (1,000 mg total) by mouth 3 (three) times daily. KEEP ON FILE FOR FUTURE REFILLS   Ventolin HFA 108 (90 Base) MCG/ACT inhaler Generic drug: albuterol Inhale 2 puffs into the lungs every 6 (six) hours as needed.      Follow-up Information    Herbert Pun, MD Follow up in 2 week(s).   Specialty: General Surgery Contact information: [address] [place] [address] [phone]

## 2018-06-29 LAB — HIV ANTIBODY (ROUTINE TESTING W REFLEX): HIV Screen 4th Generation wRfx: NONREACTIVE

## 2018-06-30 LAB — SURGICAL PATHOLOGY

## 2018-08-29 ENCOUNTER — Telehealth: Payer: Self-pay | Admitting: Internal Medicine

## 2018-08-29 NOTE — Telephone Encounter (Signed)
  To whom it may concern:  The above mentioned patient has physical and/or  psychiatric conditions that are treated in part with regular participation in aerobic exercise and weight training .  I consider regular exercise  to be vital to the maintenance of his/her wellbeing and am recommending that he/she be allowed to return to regular workouts at your facility as long as the appropriate social distancing and other measures needed to minimize transmission or infection with the COVID 19 virus are followed.  Sincerely,    Crecencio Mc, MD

## 2018-10-14 ENCOUNTER — Other Ambulatory Visit: Payer: Self-pay | Admitting: Internal Medicine

## 2018-10-14 DIAGNOSIS — Z1231 Encounter for screening mammogram for malignant neoplasm of breast: Secondary | ICD-10-CM

## 2018-10-21 ENCOUNTER — Other Ambulatory Visit: Payer: Self-pay

## 2018-10-21 ENCOUNTER — Ambulatory Visit (INDEPENDENT_AMBULATORY_CARE_PROVIDER_SITE_OTHER): Payer: BLUE CROSS/BLUE SHIELD

## 2018-10-21 DIAGNOSIS — Z23 Encounter for immunization: Secondary | ICD-10-CM | POA: Diagnosis not present

## 2018-10-26 ENCOUNTER — Encounter: Payer: Self-pay | Admitting: Internal Medicine

## 2018-10-26 DIAGNOSIS — Z1231 Encounter for screening mammogram for malignant neoplasm of breast: Secondary | ICD-10-CM | POA: Insufficient documentation

## 2018-10-26 DIAGNOSIS — Z0001 Encounter for general adult medical examination with abnormal findings: Secondary | ICD-10-CM | POA: Insufficient documentation

## 2018-11-02 ENCOUNTER — Other Ambulatory Visit: Payer: Self-pay

## 2018-11-02 MED ORDER — FLUTICASONE PROPIONATE 50 MCG/ACT NA SUSP: NASAL | 5 refills | Status: DC

## 2018-11-12 ENCOUNTER — Other Ambulatory Visit: Payer: Self-pay | Admitting: *Deleted

## 2018-11-12 DIAGNOSIS — Z20822 Contact with and (suspected) exposure to covid-19: Secondary | ICD-10-CM

## 2018-11-13 LAB — NOVEL CORONAVIRUS, NAA: SARS-CoV-2, NAA: NOT DETECTED

## 2018-12-09 ENCOUNTER — Ambulatory Visit: Payer: BLUE CROSS/BLUE SHIELD

## 2019-01-24 ENCOUNTER — Telehealth: Payer: Self-pay | Admitting: Internal Medicine

## 2019-01-24 NOTE — Telephone Encounter (Signed)
Pt has a physical on 02/24/19 but has question about mammogram. Please cb to advise.

## 2019-01-27 NOTE — Telephone Encounter (Signed)
LMTCB to answer question about mammogram ordered.

## 2019-02-10 ENCOUNTER — Encounter: Payer: BLUE CROSS/BLUE SHIELD | Admitting: Internal Medicine

## 2019-02-24 ENCOUNTER — Encounter: Payer: Self-pay | Admitting: Internal Medicine

## 2019-02-24 ENCOUNTER — Ambulatory Visit (INDEPENDENT_AMBULATORY_CARE_PROVIDER_SITE_OTHER): Payer: 59 | Admitting: Internal Medicine

## 2019-02-24 ENCOUNTER — Other Ambulatory Visit: Payer: Self-pay

## 2019-02-24 VITALS — Ht 64.0 in | Wt 193.0 lb

## 2019-02-24 DIAGNOSIS — J309 Allergic rhinitis, unspecified: Secondary | ICD-10-CM

## 2019-02-24 DIAGNOSIS — N6320 Unspecified lump in the left breast, unspecified quadrant: Secondary | ICD-10-CM

## 2019-02-24 DIAGNOSIS — E559 Vitamin D deficiency, unspecified: Secondary | ICD-10-CM | POA: Diagnosis not present

## 2019-02-24 DIAGNOSIS — Z0000 Encounter for general adult medical examination without abnormal findings: Secondary | ICD-10-CM | POA: Diagnosis not present

## 2019-02-24 DIAGNOSIS — E785 Hyperlipidemia, unspecified: Secondary | ICD-10-CM

## 2019-02-24 DIAGNOSIS — R202 Paresthesia of skin: Secondary | ICD-10-CM

## 2019-02-24 DIAGNOSIS — Z9049 Acquired absence of other specified parts of digestive tract: Secondary | ICD-10-CM

## 2019-02-24 HISTORY — DX: Unspecified lump in the left breast, unspecified quadrant: N63.20

## 2019-02-24 MED ORDER — VALACYCLOVIR HCL 1 G PO TABS: 1000.0000 mg | ORAL_TABLET | Freq: Three times a day (TID) | ORAL | 1 refills | Status: DC

## 2019-02-24 MED ORDER — FLUTICASONE PROPIONATE 50 MCG/ACT NA SUSP: NASAL | 5 refills | Status: DC

## 2019-02-24 MED ORDER — MELOXICAM 15 MG PO TABS: 15.0000 mg | ORAL_TABLET | Freq: Every day | ORAL | 3 refills | Status: DC

## 2019-02-24 NOTE — Assessment & Plan Note (Signed)

## 2019-02-24 NOTE — Assessment & Plan Note (Signed)
Rheumatologic serologic workup ordered per Dr Trena Platt request

## 2019-02-24 NOTE — Assessment & Plan Note (Signed)
Considered "probably benign" by [facility].   6 month follow up at [facility] needed.

## 2019-02-24 NOTE — Assessment & Plan Note (Signed)
Continue flonase 
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
## 2019-03-17 NOTE — Telephone Encounter (Signed)
Spoke with pt and scheduled her for a fasting lab appt. Pt is aware of appt date and time.

## 2019-03-25 ENCOUNTER — Other Ambulatory Visit (INDEPENDENT_AMBULATORY_CARE_PROVIDER_SITE_OTHER): Payer: 59

## 2019-03-25 ENCOUNTER — Other Ambulatory Visit: Payer: Self-pay

## 2019-03-25 DIAGNOSIS — E785 Hyperlipidemia, unspecified: Secondary | ICD-10-CM

## 2019-03-25 DIAGNOSIS — E559 Vitamin D deficiency, unspecified: Secondary | ICD-10-CM | POA: Diagnosis not present

## 2019-03-25 DIAGNOSIS — R202 Paresthesia of skin: Secondary | ICD-10-CM | POA: Diagnosis not present

## 2019-03-25 LAB — COMPREHENSIVE METABOLIC PANEL
ALT: 13 U/L (ref 0–35)
AST: 16 U/L (ref 0–37)
Albumin: 4 g/dL (ref 3.5–5.2)
Alkaline Phosphatase: 59 U/L (ref 39–117)
BUN: 14 mg/dL (ref 6–23)
CO2: 28 mEq/L (ref 19–32)
Calcium: 9.5 mg/dL (ref 8.4–10.5)
Chloride: 102 mEq/L (ref 96–112)
Creatinine, Ser: 0.81 mg/dL (ref 0.40–1.20)
GFR: 71.03 mL/min (ref >60.00)
Glucose, Bld: 96 mg/dL (ref 70–99)
Potassium: 4.2 mEq/L (ref 3.5–5.1)
Sodium: 138 mEq/L (ref 135–145)
Total Bilirubin: 0.5 mg/dL (ref 0.2–1.2)
Total Protein: 7.2 g/dL (ref 6.0–8.3)

## 2019-03-25 LAB — CBC WITH DIFFERENTIAL/PLATELET
Basophils Absolute: 0 10*3/uL (ref 0.0–0.1)
Basophils Relative: 0.7 % (ref 0.0–3.0)
Eosinophils Absolute: 0.1 10*3/uL (ref 0.0–0.7)
Eosinophils Relative: 2.3 % (ref 0.0–5.0)
HCT: 42.3 % (ref 36.0–46.0)
Hemoglobin: 13.6 g/dL (ref 12.0–15.0)
Lymphocytes Relative: 47.2 % — ABNORMAL HIGH (ref 12.0–46.0)
Lymphs Abs: 2.2 10*3/uL (ref 0.7–4.0)
MCHC: 32.3 g/dL (ref 30.0–36.0)
MCV: 81 fl (ref 78.0–100.0)
Monocytes Absolute: 0.3 10*3/uL (ref 0.1–1.0)
Monocytes Relative: 6.5 % (ref 3.0–12.0)
Neutro Abs: 2 10*3/uL (ref 1.4–7.7)
Neutrophils Relative %: 43.3 % (ref 43.0–77.0)
Platelets: 230 10*3/uL (ref 150.0–400.0)
RBC: 5.22 Mil/uL — ABNORMAL HIGH (ref 3.87–5.11)
RDW: 14.6 % (ref 11.5–15.5)
WBC: 4.6 10*3/uL (ref 4.0–10.5)

## 2019-03-25 LAB — HEMOGLOBIN A1C: Hgb A1c MFr Bld: 5.8 % (ref 4.6–6.5)

## 2019-03-25 LAB — LIPID PANEL
Cholesterol: 177 mg/dL (ref 0–200)
HDL: 60 mg/dL (ref >39.00)
LDL Cholesterol: 82 mg/dL (ref 0–99)
NonHDL: 116.8
Total CHOL/HDL Ratio: 3
Triglycerides: 175 mg/dL — ABNORMAL HIGH (ref 0.0–149.0)
VLDL: 35 mg/dL (ref 0.0–40.0)

## 2019-03-25 LAB — VITAMIN D 25 HYDROXY (VIT D DEFICIENCY, FRACTURES): VITD: 40.66 ng/mL (ref 30.00–100.00)

## 2019-03-25 LAB — B12 AND FOLATE PANEL
Folate: >23.7 ng/mL (ref >5.9)
Vitamin B-12: 1321 pg/mL — ABNORMAL HIGH (ref 211–911)

## 2019-03-25 LAB — SEDIMENTATION RATE: Sed Rate: 34 mm/hr — ABNORMAL HIGH (ref 0–30)

## 2019-03-30 LAB — RHEUMATOID FACTOR: Rheumatoid fact SerPl-aCnc: <14 IU/mL (ref <14)

## 2019-03-30 LAB — ANTI-NUCLEAR AB-TITER (ANA TITER): ANA Titer 1: 1:40 {titer} — ABNORMAL HIGH

## 2019-03-30 LAB — RPR: RPR Ser Ql: NONREACTIVE

## 2019-03-30 LAB — VITAMIN B1: Vitamin B1 (Thiamine): 19 nmol/L (ref 8–30)

## 2019-03-30 LAB — ANA: Anti Nuclear Antibody (ANA): POSITIVE — AB

## 2019-03-30 LAB — HIV ANTIBODY (ROUTINE TESTING W REFLEX): HIV 1&2 Ab, 4th Generation: NONREACTIVE

## 2019-03-30 LAB — VITAMIN B6: Vitamin B6: 77.3 ng/mL — ABNORMAL HIGH (ref 2.1–21.7)

## 2019-04-06 ENCOUNTER — Telehealth: Payer: Self-pay

## 2019-04-06 MED ORDER — ESTRADIOL 0.1 MG/GM VA CREA: 1.0000 | TOPICAL_CREAM | VAGINAL | 12 refills | Status: DC

## 2019-04-06 NOTE — Telephone Encounter (Signed)
Received a fax from pt's pharmacy stating that pt's insurance does not cover the premarin vaginal cream but will cover the estradiol vaginal cream. Is it okay to change to the Estradiol cream?

## 2019-04-06 NOTE — Telephone Encounter (Signed)
Estradiol cream sent to pharmacy

## 2019-05-22 DIAGNOSIS — J302 Other seasonal allergic rhinitis: Secondary | ICD-10-CM | POA: Insufficient documentation

## 2019-05-31 DIAGNOSIS — R928 Other abnormal and inconclusive findings on diagnostic imaging of breast: Secondary | ICD-10-CM

## 2019-06-01 MED ORDER — ESOMEPRAZOLE MAGNESIUM 40 MG PO CPDR: 40.0000 mg | DELAYED_RELEASE_CAPSULE | Freq: Every day | ORAL | 5 refills | Status: DC

## 2019-06-07 ENCOUNTER — Other Ambulatory Visit: Payer: Self-pay

## 2019-06-07 MED ORDER — FLUTICASONE PROPIONATE 50 MCG/ACT NA SUSP: NASAL | 5 refills | Status: DC

## 2019-06-08 ENCOUNTER — Other Ambulatory Visit: Payer: Self-pay | Admitting: Unknown Physician Specialty

## 2019-06-08 ENCOUNTER — Other Ambulatory Visit: Payer: Self-pay

## 2019-06-08 ENCOUNTER — Ambulatory Visit
Admission: RE | Admit: 2019-06-08 | Discharge: 2019-06-08 | Disposition: A | Discharge status: Home / Self Care | Payer: 59 | Source: Ambulatory Visit | Attending: Unknown Physician Specialty | Admitting: Unknown Physician Specialty

## 2019-06-08 ENCOUNTER — Ambulatory Visit
Admission: RE | Admit: 2019-06-08 | Discharge: 2019-06-08 | Disposition: A | Discharge status: Home / Self Care | Payer: 59 | Attending: Unknown Physician Specialty | Admitting: Unknown Physician Specialty

## 2019-06-08 DIAGNOSIS — R05 Cough: Secondary | ICD-10-CM | POA: Diagnosis not present

## 2019-06-08 DIAGNOSIS — R053 Chronic cough: Secondary | ICD-10-CM

## 2019-08-08 ENCOUNTER — Ambulatory Visit
Admission: RE | Admit: 2019-08-08 | Discharge: 2019-08-08 | Disposition: A | Discharge status: Home / Self Care | Payer: PPO | Source: Ambulatory Visit | Attending: Internal Medicine | Admitting: Internal Medicine

## 2019-08-08 DIAGNOSIS — R928 Other abnormal and inconclusive findings on diagnostic imaging of breast: Secondary | ICD-10-CM | POA: Diagnosis not present

## 2019-08-08 DIAGNOSIS — N6012 Diffuse cystic mastopathy of left breast: Secondary | ICD-10-CM | POA: Diagnosis not present

## 2019-08-11 ENCOUNTER — Other Ambulatory Visit: Payer: Self-pay | Admitting: Internal Medicine

## 2019-08-29 DIAGNOSIS — U071 COVID-19: Secondary | ICD-10-CM | POA: Diagnosis not present

## 2019-08-29 DIAGNOSIS — Z03818 Encounter for observation for suspected exposure to other biological agents ruled out: Secondary | ICD-10-CM | POA: Diagnosis not present

## 2019-08-29 DIAGNOSIS — Z20822 Contact with and (suspected) exposure to covid-19: Secondary | ICD-10-CM | POA: Diagnosis not present

## 2019-09-20 ENCOUNTER — Telehealth: Payer: Self-pay

## 2019-09-20 NOTE — Telephone Encounter (Signed)
LMTCB.   Received a fax from [facility]iology stating that pt is due for a follow up breast imaging as of June 2021 and they have not had a call from pt to schedule the appt. Dr. Derrel Nip advised that I call pt to follow up on this.

## 2019-09-20 NOTE — Telephone Encounter (Signed)
Patient was returning call about breast imaging appt

## 2019-09-21 DIAGNOSIS — G5602 Carpal tunnel syndrome, left upper limb: Secondary | ICD-10-CM | POA: Diagnosis not present

## 2019-09-21 DIAGNOSIS — M19049 Primary osteoarthritis, unspecified hand: Secondary | ICD-10-CM | POA: Diagnosis not present

## 2019-09-21 DIAGNOSIS — M19042 Primary osteoarthritis, left hand: Secondary | ICD-10-CM | POA: Diagnosis not present

## 2019-09-21 NOTE — Telephone Encounter (Signed)
LMTCB

## 2019-09-22 NOTE — Telephone Encounter (Addendum)
Spoke with and she stated that she had the breast exam done with [facility] rather than [facility]. This imaging was done on 08/08/2019 at [facility].

## 2019-09-22 NOTE — Telephone Encounter (Signed)
Patient was returning call about breast imaging appt

## 2019-10-27 DIAGNOSIS — C44729 Squamous cell carcinoma of skin of left lower limb, including hip: Secondary | ICD-10-CM | POA: Diagnosis not present

## 2019-10-27 DIAGNOSIS — L821 Other seborrheic keratosis: Secondary | ICD-10-CM | POA: Diagnosis not present

## 2019-10-27 DIAGNOSIS — D485 Neoplasm of uncertain behavior of skin: Secondary | ICD-10-CM | POA: Diagnosis not present

## 2019-10-27 DIAGNOSIS — Z08 Encounter for follow-up examination after completed treatment for malignant neoplasm: Secondary | ICD-10-CM | POA: Diagnosis not present

## 2019-10-27 DIAGNOSIS — Z85828 Personal history of other malignant neoplasm of skin: Secondary | ICD-10-CM | POA: Diagnosis not present

## 2019-11-10 DIAGNOSIS — H2513 Age-related nuclear cataract, bilateral: Secondary | ICD-10-CM | POA: Diagnosis not present

## 2019-11-18 DIAGNOSIS — C44729 Squamous cell carcinoma of skin of left lower limb, including hip: Secondary | ICD-10-CM | POA: Diagnosis not present

## 2019-12-27 DIAGNOSIS — Z23 Encounter for immunization: Secondary | ICD-10-CM | POA: Diagnosis not present

## 2019-12-27 DIAGNOSIS — S61218A Laceration without foreign body of other finger without damage to nail, initial encounter: Secondary | ICD-10-CM | POA: Diagnosis not present

## 2020-01-08 ENCOUNTER — Other Ambulatory Visit: Payer: Self-pay | Admitting: Internal Medicine

## 2020-01-13 DIAGNOSIS — Z03818 Encounter for observation for suspected exposure to other biological agents ruled out: Secondary | ICD-10-CM | POA: Diagnosis not present

## 2020-01-13 DIAGNOSIS — U071 COVID-19: Secondary | ICD-10-CM | POA: Diagnosis not present

## 2020-01-13 DIAGNOSIS — Z20822 Contact with and (suspected) exposure to covid-19: Secondary | ICD-10-CM | POA: Diagnosis not present

## 2020-01-14 DIAGNOSIS — U071 COVID-19: Secondary | ICD-10-CM

## 2020-01-14 HISTORY — DX: COVID-19: U07.1

## 2020-01-19 DIAGNOSIS — N390 Urinary tract infection, site not specified: Secondary | ICD-10-CM | POA: Diagnosis not present

## 2020-01-19 DIAGNOSIS — R350 Frequency of micturition: Secondary | ICD-10-CM | POA: Diagnosis not present

## 2020-01-19 DIAGNOSIS — R319 Hematuria, unspecified: Secondary | ICD-10-CM | POA: Diagnosis not present

## 2020-01-20 ENCOUNTER — Other Ambulatory Visit: Payer: Self-pay | Admitting: Internal Medicine

## 2020-01-20 MED ORDER — CIPROFLOXACIN HCL 250 MG PO TABS: 250.0000 mg | ORAL_TABLET | Freq: Two times a day (BID) | ORAL | 0 refills | Status: DC

## 2020-01-30 DIAGNOSIS — L708 Other acne: Secondary | ICD-10-CM | POA: Diagnosis not present

## 2020-02-01 ENCOUNTER — Encounter: Payer: Self-pay | Admitting: *Deleted

## 2020-02-16 DIAGNOSIS — Z20822 Contact with and (suspected) exposure to covid-19: Secondary | ICD-10-CM | POA: Diagnosis not present

## 2020-02-16 DIAGNOSIS — Z03818 Encounter for observation for suspected exposure to other biological agents ruled out: Secondary | ICD-10-CM | POA: Diagnosis not present

## 2020-02-16 DIAGNOSIS — U071 COVID-19: Secondary | ICD-10-CM | POA: Diagnosis not present

## 2020-02-17 ENCOUNTER — Ambulatory Visit (INDEPENDENT_AMBULATORY_CARE_PROVIDER_SITE_OTHER): Payer: PPO

## 2020-02-17 ENCOUNTER — Ambulatory Visit
Admission: EM | Admit: 2020-02-17 | Discharge: 2020-02-17 | Disposition: A | Discharge status: Home / Self Care | Payer: PPO | Attending: Family Medicine | Admitting: Family Medicine

## 2020-02-17 ENCOUNTER — Other Ambulatory Visit: Payer: Self-pay

## 2020-02-17 ENCOUNTER — Encounter: Payer: Self-pay | Admitting: Emergency Medicine

## 2020-02-17 DIAGNOSIS — R059 Cough, unspecified: Secondary | ICD-10-CM | POA: Diagnosis not present

## 2020-02-17 DIAGNOSIS — R509 Fever, unspecified: Secondary | ICD-10-CM | POA: Diagnosis not present

## 2020-02-17 MED ORDER — BENZONATATE 100 MG PO CAPS
100.0000 mg | ORAL_CAPSULE | Freq: Three times a day (TID) | ORAL | 0 refills | Status: DC
Start: 2020-02-17 — End: 2020-03-16

## 2020-02-17 NOTE — Discharge Instructions (Addendum)
Your x ray was normal This may be another virus.  Tessalon pearls for cough.  Keep taking the OTC medicines as needed  Follow up as needed for continued or worsening symptoms

## 2020-02-17 NOTE — ED Triage Notes (Signed)
Pt presents today with cough, fever and nasal congestion. Pos Covid on 01/14/20 and Neg Covid yesterday 02/16/20. UTI 01/19/20
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
## 2020-03-16 ENCOUNTER — Encounter: Payer: Self-pay | Admitting: Internal Medicine

## 2020-03-16 ENCOUNTER — Other Ambulatory Visit: Payer: Self-pay

## 2020-03-16 ENCOUNTER — Ambulatory Visit (INDEPENDENT_AMBULATORY_CARE_PROVIDER_SITE_OTHER): Payer: PPO | Admitting: Internal Medicine

## 2020-03-16 VITALS — BP 112/76 | HR 80 | Temp 97.7°F | Resp 15 | Ht 64.0 in | Wt 192.4 lb

## 2020-03-16 DIAGNOSIS — M8580 Other specified disorders of bone density and structure, unspecified site: Secondary | ICD-10-CM | POA: Diagnosis not present

## 2020-03-16 DIAGNOSIS — Z780 Asymptomatic menopausal state: Secondary | ICD-10-CM | POA: Diagnosis not present

## 2020-03-16 DIAGNOSIS — Z23 Encounter for immunization: Secondary | ICD-10-CM | POA: Diagnosis not present

## 2020-03-16 DIAGNOSIS — R5383 Other fatigue: Secondary | ICD-10-CM | POA: Diagnosis not present

## 2020-03-16 DIAGNOSIS — Z8616 Personal history of COVID-19: Secondary | ICD-10-CM

## 2020-03-16 DIAGNOSIS — E785 Hyperlipidemia, unspecified: Secondary | ICD-10-CM | POA: Diagnosis not present

## 2020-03-16 DIAGNOSIS — Z0000 Encounter for general adult medical examination without abnormal findings: Secondary | ICD-10-CM | POA: Diagnosis not present

## 2020-03-16 DIAGNOSIS — Z1231 Encounter for screening mammogram for malignant neoplasm of breast: Secondary | ICD-10-CM

## 2020-03-16 DIAGNOSIS — R9431 Abnormal electrocardiogram [ECG] [EKG]: Secondary | ICD-10-CM | POA: Diagnosis not present

## 2020-03-16 DIAGNOSIS — Z8709 Personal history of other diseases of the respiratory system: Secondary | ICD-10-CM | POA: Diagnosis not present

## 2020-03-16 LAB — COMPREHENSIVE METABOLIC PANEL
ALT: 13 U/L (ref 0–35)
AST: 19 U/L (ref 0–37)
Albumin: 3.8 g/dL (ref 3.5–5.2)
Alkaline Phosphatase: 60 U/L (ref 39–117)
BUN: 11 mg/dL (ref 6–23)
CO2: 26 mEq/L (ref 19–32)
Calcium: 9.1 mg/dL (ref 8.4–10.5)
Chloride: 106 mEq/L (ref 96–112)
Creatinine, Ser: 0.74 mg/dL (ref 0.40–1.20)
GFR: 84.75 mL/min (ref >60.00)
Glucose, Bld: 105 mg/dL — ABNORMAL HIGH (ref 70–99)
Potassium: 4.2 mEq/L (ref 3.5–5.1)
Sodium: 138 mEq/L (ref 135–145)
Total Bilirubin: 0.5 mg/dL (ref 0.2–1.2)
Total Protein: 6.6 g/dL (ref 6.0–8.3)

## 2020-03-16 LAB — CBC WITH DIFFERENTIAL/PLATELET
Basophils Absolute: 0 10*3/uL (ref 0.0–0.1)
Basophils Relative: 1 % (ref 0.0–3.0)
Eosinophils Absolute: 0.2 10*3/uL (ref 0.0–0.7)
Eosinophils Relative: 4 % (ref 0.0–5.0)
HCT: 39.4 % (ref 36.0–46.0)
Hemoglobin: 12.5 g/dL (ref 12.0–15.0)
Lymphocytes Relative: 44 % (ref 12.0–46.0)
Lymphs Abs: 2 10*3/uL (ref 0.7–4.0)
MCHC: 31.7 g/dL (ref 30.0–36.0)
MCV: 79.7 fl (ref 78.0–100.0)
Monocytes Absolute: 0.3 10*3/uL (ref 0.1–1.0)
Monocytes Relative: 5.9 % (ref 3.0–12.0)
Neutro Abs: 2 10*3/uL (ref 1.4–7.7)
Neutrophils Relative %: 45.1 % (ref 43.0–77.0)
Platelets: 222 10*3/uL (ref 150.0–400.0)
RBC: 4.94 Mil/uL (ref 3.87–5.11)
RDW: 15.5 % (ref 11.5–15.5)
WBC: 4.5 10*3/uL (ref 4.0–10.5)

## 2020-03-16 LAB — LIPID PANEL
Cholesterol: 179 mg/dL (ref 0–200)
HDL: 60.9 mg/dL (ref >39.00)
LDL Cholesterol: 96 mg/dL (ref 0–99)
NonHDL: 117.91
Total CHOL/HDL Ratio: 3
Triglycerides: 111 mg/dL (ref 0.0–149.0)
VLDL: 22.2 mg/dL (ref 0.0–40.0)

## 2020-03-16 LAB — TSH: TSH: 3.97 u[IU]/mL (ref 0.35–4.50)

## 2020-03-16 MED ORDER — VENTOLIN HFA 108 (90 BASE) MCG/ACT IN AERS: 2.0000 | INHALATION_SPRAY | Freq: Four times a day (QID) | RESPIRATORY_TRACT | 1 refills | Status: DC | PRN

## 2020-03-16 NOTE — Progress Notes (Signed)
The patient is here for the Welcome to  Medicare  preventive visit   This visit occurred during the SARS-CoV-2 public health emergency.  Safety protocols were in place, including screening questions prior to the visit, additional usage of staff PPE, and extensive cleaning of exam room while observing appropriate contact time as indicated for disinfecting solutions.   Kathleen Russell  has a past medical history of allergic rhinitis, Broken ankle, Chicken pox, COVID-19 (01/14/2020), GERD (gastroesophageal reflux disease), History of broken nose, UTI (urinary tract infection), Kidney infection, Menopause, premature, Raynaud's phenomenon, and Rhinitis, nonallergic.    reports that she has never smoked. She has never used smokeless tobacco. She reports current alcohol use. She reports that she does not use drugs.   The roster of all physicians providing medical care to patient : Patient Care Team: Crecencio Mc, MD as PCP - General (Internal Medicine) Crecencio Mc, MD (Internal Medicine) Bary Castilla Forest Gleason, MD (General Surgery)  Porfilio annually,  Small cataract left eye   Activities of daily living:  The patient is 100% independent in all ADLs: dressing, toileting, feeding as well as independent mobility Fall risk was assessed by direct patient evaluation of patient's balance, gait and ability to risk from a chair and from a kneeling position. Home safety : The patient has smoke detectors in the home. They wear seatbelts.  There are no firearms at home. There is no violence in the home.  Patient has seen their eye doctor in the last year.   Visual acuity was assessed today and was 20/20 with correction lenses. Patient denies hearing difficulty with regard to whispered voices and some television programs.  She has  deferred audiologic testing in the last year.   There is no risks for hepatitis, STDs or HIV. There is no   history of blood transfusion. They have no travel history to infectious disease  endemic areas of the world.  The patient has seen their dentist in the last six month. She does not  have excessive sun exposure. Discussed the need for sun protection: hats, long sleeves and use of sunscreen if there is significant sun exposure.   Diet: the importance of a healthy diet is discussed. They do have a healthy diet.  The benefits of regular aerobic exercise were discussed. Patient  exercises at the gym 3 days per week and walks on the other days   Depression screen:   Depression screen Kathleen Russell  Decreased Interest 0 0 0 0 0  Down, Depressed, Hopeless 0 0 0 0 0  PHQ - 2 Score 0 0 0 0 0  Altered sleeping - - 0 - -  Tired, decreased energy - - 0 - -  Change in appetite - - 0 - -  Feeling bad or failure about yourself  - - 0 - -  Trouble concentrating - - 0 - -  Moving slowly or fidgety/restless - - 0 - -  Suicidal thoughts - - 0 - -  PHQ-9 Score - - 0 - -  Difficult doing work/chores - - Not difficult at all - -       Cognitive assessment: the patient manages all their financial and personal affairs and is actively engaged. She could relate day,date,year and events; recalled 2/3 objects at 3 minutes; performed clock-face test normally.  The following portions of the patient's history were reviewed and updated as appropriate: allergies, current medications, past family history, past medical history,  past surgical history,  past social history  and problem list.  Visual acuity was not assessed per patient preference since she has regular follow up with her ophthalmologist. Hearing and body mass index were assessed and reviewed.   During the course of the visit the patient was educated and counseled about appropriate screening and preventive services including : fall prevention , diabetes screening, nutrition counseling, colorectal cancer screening, and recommended immunizations   Immunization History  Administered Date(s)  Administered  . Influenza Split 10/14/2011, 10/31/2013, 11/13/2014  . Influenza,inj,Quad PF,6+ Mos 10/21/2018  . Influenza-Unspecified 10/12/2012, 10/20/2013, 10/06/2016, 10/15/2017, 10/25/2019  . PFIZER(Purple Top)SARS-COV-2 Vaccination 03/19/2019, 04/14/2019, 11/20/2019  . PNEUMOCOCCAL CONJUGATE-20 03/16/2020  . Tdap 10/29/2011, 12/27/2019  . Zoster Recombinat (Shingrix) 10/14/2018, 12/20/2018  HMLISTPATIENTFRIENDLY@ Health Maintenance Due  Topic Date Due  . MAMMOGRAM  06/10/2019  . DEXA SCAN  Never done  . PNA vac Low Risk Adult (1 of 2 - PCV13) 07/13/2019    Last skin cancer screening :    Kathleen Russell.    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 PERRL, EOM's intact. Fundi benign. Ears: normal TM's and external ear canals both ears Nose: Nares normal. Septum midline. Mucosa normal. No drainage or sinus tenderness. Throat: lips, mucosa, and tongue normal; teeth and gums normal Neck: no adenopathy, no carotid bruit, no JVD, supple, symmetrical, trachea midline and thyroid not enlarged, symmetric, no tenderness/mass/nodules Lungs: clear to auscultation bilaterally Breasts: normal appearance, no masses or tenderness Heart: regular rate and rhythm, S1, S2 normal, no murmur, click, rub or gallop Abdomen: soft, non-tender; bowel sounds normal; no masses,  no organomegaly Extremities: extremities normal, atraumatic, no cyanosis or edema Pulses: 2+ and symmetric Skin: Skin color, texture, turgor normal. No rashes or lesions Neurologic: Alert and oriented X 3,  normal strength and tone. Normal symmetric reflexes. Normal coordination and gait.     End of Life Discussion and Planning   During the course of the visit , End of Life objectives were discussed at length.  Patient has  a living will in place .    Review of Opioid Prescriptions    Patient does not have a current opioid prescription   Assessment and Plan   History of influenza Reported by patient (not tested) but with  classic symptoms of cough ,  Fevers to 102  And body aches  Treated by urgent care Feb 8 but not tested    Welcome to Medicare preventive visit age appropriate education and counseling updated, referrals for preventative services and immunizations addressed, dietary and smoking counseling addressed, most recent labs reviewed.  I have personally reviewed and have noted:  1) the patient's medical and social history 2) The pt's use of alcohol, tobacco, and illicit drugs 3) The patient's current medications and supplements 4) Functional ability including ADL's, fall risk, home safety risk, hearing and visual impairment 5) Diet and physical activities 6) Evidence for depression or mood disorder 7) The patient's height, weight, and BMI have been recorded in the chart  I have made  referrals, and provided counseling and education based on review of the above  I have ordered and reviewed a 12 lead EKG and find that there are no acute changes and patient is in sinus rhythm  Abnormal EKG I have ordered and reviewed a 12 lead EKG ;  She has small q waves in several contiguous leads suggesting prior inferior wall MI.  Referral  to cardiology recommended.  Hyperlipidemia LDL goal <160  Her 10 yr risk of CAD is < 8%   .  Will consider recommending statin therapy pending cardiology evaluation.   Lab Results  Component Value Date   CHOL 179 03/16/2020   HDL 60.90 03/16/2020   LDLCALC 96 03/16/2020   LDLDIRECT 123.0 02/02/2015   TRIG 111.0 03/16/2020   CHOLHDL 3 03/16/2020       Updated Medication List Outpatient Encounter Medications as of 03/16/2020  Medication Sig  . acetaminophen (TYLENOL) 500 MG tablet Take by mouth.  . cetirizine (ZYRTEC) 10 MG tablet Take 10 mg by mouth daily.  Marland Kitchen esomeprazole (NEXIUM) 40 MG capsule TAKE 1 CAPSULE(40 MG) BY MOUTH DAILY BEFORE BREAKFAST  . estradiol (ESTRACE) 0.1 MG/GM vaginal cream Place 1 Applicatorful vaginally 3 (three) times a week.  . fluticasone (FLONASE) 50 MCG/ACT nasal spray SHAKE LIQUID AND USE 2 SPRAYS IN EACH NOSTRIL AT BEDTIME AS NEEDED  . meloxicam (MOBIC) 15 MG tablet TAKE 1 TABLET(15 MG) BY MOUTH DAILY AS NEEDED FOR JOINT PAIN  . metroNIDAZOLE (METROGEL) 1 % gel APPLY TO TO THE AFFECTED AREA ON FACE NIGHTLY  . Multiple Vitamin (MULTIVITAMIN) tablet Take 1 tablet by mouth daily.  . valACYclovir (VALTREX) 1000 MG tablet Take 1 tablet (1,000 mg total) by mouth 3 (three) times daily. KEEP ON FILE FOR FUTURE REFILLS  . VENTOLIN HFA 108 (90 Base) MCG/ACT inhaler Inhale 2 puffs into the lungs every 6 (six) hours as needed.  . [DISCONTINUED] benzonatate (TESSALON) 100 MG capsule Take 1 capsule (100 mg total) by mouth every 8 (eight) hours. (Patient not taking: Reported on 03/16/2020)  . [DISCONTINUED] VENTOLIN HFA 108 (90 Base) MCG/ACT inhaler Inhale 2 puffs into the lungs every 6 (six) hours as needed.   No facility-administered encounter medications on file as of 03/16/2020.

## 2020-03-16 NOTE — Assessment & Plan Note (Addendum)
Reported by patient (not tested) but with  classic symptoms of cough ,  Fevers to 102  And body aches  Treated by urgent care Feb 8 but not tested

## 2020-03-16 NOTE — Patient Instructions (Signed)
Your annual mammogram has been ordered.  You are encouraged (required) to call to make your appointment at [facility] After Age 66 After age 66, you are at a higher risk for certain long-term diseases and infections as well as injuries from falls. Falls are a major cause of broken bones and head injuries in people who are older than age 66. Getting regular preventive care can help to keep you healthy and well. Preventive care includes getting regular testing and making lifestyle changes as recommended by your health care provider. Talk with your health care provider about:  Which screenings and tests you should have. A screening is a test that checks for a disease when you have no symptoms.  A diet and exercise plan that is right for you. What should I know about screenings and tests to prevent falls? Screening and testing are the best ways to find a health problem early. Early diagnosis and treatment give you the best chance of managing medical conditions that are common after age 66. Certain conditions and lifestyle choices may make you more likely to have a fall. Your health care provider may recommend:  Regular vision checks. Poor vision and conditions such as cataracts can make you more likely to have a fall. If you wear glasses, make sure to get your prescription updated if your vision changes.  Medicine review. Work with your health care provider to regularly review all of the medicines you are taking, including over-the-counter medicines. Ask your health care provider about any side effects that may make you more likely to have a fall. Tell your health care provider if any medicines that you take make you feel dizzy or sleepy.  Osteoporosis screening. Osteoporosis is a condition that causes the bones to get weaker. This can make the bones weak and cause them to break more easily.  Blood pressure  screening. Blood pressure changes and medicines to control blood pressure can make you feel dizzy.  Strength and balance checks. Your health care provider may recommend certain tests to check your strength and balance while standing, walking, or changing positions.  Foot health exam. Foot pain and numbness, as well as not wearing proper footwear, can make you more likely to have a fall.  Depression screening. You may be more likely to have a fall if you have a fear of falling, feel emotionally low, or feel unable to do activities that you used to do.  Alcohol use screening. Using too much alcohol can affect your balance and may make you more likely to have a fall. What actions can I take to lower my risk of falls? General instructions  Talk with your health care provider about your risks for falling. Tell your health care provider if: ? You fall. Be sure to tell your health care provider about all falls, even ones that seem minor. ? You feel dizzy, sleepy, or off-balance.  Take over-the-counter and prescription medicines only as told by your health care provider. These include any supplements.  Eat a healthy diet and maintain a healthy weight. A healthy diet includes low-fat dairy products, low-fat (lean) meats, and fiber from whole grains, beans, and lots of fruits and vegetables. Home safety  Remove any tripping hazards, such as rugs, cords, and clutter.  Install safety equipment such as grab bars in bathrooms and safety rails on stairs.  Keep rooms and walkways well-lit. Activity  Follow  a regular exercise program to stay fit. This will help you maintain your balance. Ask your health care provider what types of exercise are appropriate for you.  If you need a cane or walker, use it as recommended by your health care provider.  Wear supportive shoes that have nonskid soles.   Lifestyle  Do not drink alcohol if your health care provider tells you not to drink.  If you drink  alcohol, limit how much you have: ? 0-1 drink a day for women. ? 0-2 drinks a day for men.  Be aware of how much alcohol is in your drink. In the [place] one drink equals one typical bottle of beer (12 oz), one-half glass of wine (5 oz), or one shot of hard liquor (1 oz).  Do not use any products that contain nicotine or tobacco, such as cigarettes and e-cigarettes. If you need help quitting, ask your health care provider. Summary  Having a healthy lifestyle and getting preventive care can help to protect your health and wellness after age 66.  Screening and testing are the best way to find a health problem early and help you avoid having a fall. Early diagnosis and treatment give you the best chance for managing medical conditions that are more common for people who are older than age 66.  Falls are a major cause of broken bones and head injuries in people who are older than age 66. Take precautions to prevent a fall at home.  Work with your health care provider to learn what changes you can make to improve your health and wellness and to prevent falls. This information is not intended to replace advice given to you by your health care provider. Make sure you discuss any questions you have with your health care provider. Document Revised: 04/15/2018 Document Reviewed: 11/05/2016 Elsevier Patient Education  2021 [employer].

## 2020-03-17 ENCOUNTER — Encounter: Payer: Self-pay | Admitting: Internal Medicine

## 2020-03-18 DIAGNOSIS — R9431 Abnormal electrocardiogram [ECG] [EKG]: Secondary | ICD-10-CM | POA: Insufficient documentation

## 2020-03-18 NOTE — Assessment & Plan Note (Signed)
age appropriate education and counseling updated, referrals for preventative services and immunizations addressed, dietary and smoking counseling addressed, most recent labs reviewed.  I have personally reviewed and have noted:  1) the patient's medical and social history 2) The pt's use of alcohol, tobacco, and illicit drugs 3) The patient's current medications and supplements 4) Functional ability including ADL's, fall risk, home safety risk, hearing and visual impairment 5) Diet and physical activities 6) Evidence for depression or mood disorder 7) The patient's height, weight, and BMI have been recorded in the chart  I have made referrals, and provided counseling and education based on review of the above  I have ordered and reviewed a 12 lead EKG and find that there are no acute changes and patient is in sinus rhythm

## 2020-03-18 NOTE — Progress Notes (Signed)
Your labs are normal,  but your EKG has some abnormalities that are potentially significant  for heart disease so I am making a referral to [facility] cardiology for an evaluation .  If you do not hear from our office or theirs,  in one week, please let me know

## 2020-03-18 NOTE — Assessment & Plan Note (Addendum)
I have ordered and reviewed a 12 lead EKG ;  She has small q waves in several contiguous leads suggesting prior inferior wall MI.  Referral  to cardiology recommended.

## 2020-03-18 NOTE — Assessment & Plan Note (Signed)
Her 10 yr risk of CAD is < 8%   .  Will consider recommending statin therapy pending cardiology evaluation.   Lab Results  Component Value Date   CHOL 179 03/16/2020   HDL 60.90 03/16/2020   LDLCALC 96 03/16/2020   LDLDIRECT 123.0 02/02/2015   TRIG 111.0 03/16/2020   CHOLHDL 3 03/16/2020

## 2020-03-20 LAB — SARS-COV-2 SEMI-QUANTITATIVE TOTAL ANTIBODY, SPIKE: SARS COV2 AB, Total Spike Semi QN: >2500 U/mL — ABNORMAL HIGH (ref <0.8)

## 2020-03-20 NOTE — Addendum Note (Signed)
Addended by: Crecencio Mc on: 03/20/2020 09:58 AM   Modules accepted: Orders

## 2020-03-21 ENCOUNTER — Encounter: Payer: Self-pay | Admitting: Internal Medicine

## 2020-03-21 ENCOUNTER — Ambulatory Visit: Payer: PPO | Admitting: Internal Medicine

## 2020-03-21 ENCOUNTER — Other Ambulatory Visit: Payer: Self-pay

## 2020-03-21 VITALS — BP 118/70 | HR 77 | Ht 64.0 in | Wt 191.5 lb

## 2020-03-21 DIAGNOSIS — R0789 Other chest pain: Secondary | ICD-10-CM | POA: Diagnosis not present

## 2020-03-21 DIAGNOSIS — R9431 Abnormal electrocardiogram [ECG] [EKG]: Secondary | ICD-10-CM

## 2020-03-21 DIAGNOSIS — R0600 Dyspnea, unspecified: Secondary | ICD-10-CM

## 2020-03-21 DIAGNOSIS — R0609 Other forms of dyspnea: Secondary | ICD-10-CM

## 2020-03-21 NOTE — Patient Instructions (Signed)
Medication Instructions:  Your physician recommends that you continue on your current medications as directed. Please refer to the Current Medication list given to you today.  *If you need a refill on your cardiac medications before your next appointment, please call your pharmacy*   Lab Work:   COVID PRE- TEST: You will need a COVID TEST prior to the procedure: the day before the myoview sometime between 8 am and 9:30 am. Testing location: Pre-Admit testing office, [address] in the [place] located on the [facility] campus at [address] [place], [place] [address].   If you have labs (blood work) drawn today and your tests are completely normal, you will receive your results only by: Marland Kitchen MyChart Message (if you have MyChart) OR . A paper copy in the mail If you have any lab test that is abnormal or we need to change your treatment, we will call you to review the results.   Testing/Procedures:  Sarita  Your caregiver has ordered a Stress Test with nuclear imaging. The purpose of this test is to evaluate the blood supply to your heart muscle. This procedure is referred to as a "Non-Invasive Stress Test." This is because other than having an IV started in your vein, nothing is inserted or "invades" your body. Cardiac stress tests are done to find areas of poor blood flow to the heart by determining the extent of coronary artery disease (CAD). Some patients exercise on a treadmill, which naturally increases the blood flow to your heart, while others who are  unable to walk on a treadmill due to physical limitations have a pharmacologic/chemical stress agent called Lexiscan . This medicine will mimic walking on a treadmill by temporarily increasing your coronary blood flow.   Please note: these test may take anywhere between 2-4 hours to complete  PLEASE REPORT TO [facility] AT THE FIRST DESK WILL DIRECT  YOU WHERE TO GO  Date of Procedure:_____________________________________  Arrival Time for Procedure:______________________________   PLEASE NOTIFY THE OFFICE AT LEAST 24 HOURS IN ADVANCE IF YOU ARE UNABLE TO KEEP YOUR APPOINTMENT.  [phone] AND  PLEASE NOTIFY NUCLEAR MEDICINE AT [facility] AT LEAST 24 HOURS IN ADVANCE IF YOU ARE UNABLE TO KEEP YOUR APPOINTMENT. [phone]  How to prepare for your Myoview test:  1. Do not eat or drink after midnight 2. No caffeine for 24 hours prior to test 3. No smoking 24 hours prior to test. 4. Your medication may be taken with water.  If your doctor stopped a medication because of this test, do not take that medication. 5. Ladies, please do not wear dresses.  Skirts or pants are appropriate. Please wear a short sleeve shirt. 6. No perfume, cologne or lotion. 7. Wear comfortable walking shoes. No heels!  Follow-Up: At [facility], you and your health needs are our priority.  As part of our continuing mission to provide you with exceptional heart care, we have created designated Provider Care Teams.  These Care Teams include your primary Cardiologist (physician) and Advanced Practice Providers (APPs -  Physician Assistants and Nurse Practitioners) who all work together to provide you with the care you need, when you need it.  We recommend signing up for the patient portal called "MyChart".  Sign up information is provided on this After Visit Summary.  MyChart is used to connect with patients for Virtual Visits (Telemedicine).  Patients are able to view lab/test results, encounter notes, upcoming  appointments, etc.  Non-urgent messages can be sent to your provider as well.   To learn more about what you can do with MyChart, go to [email or web address].    Your next appointment:   As needed.   The format for your next appointment:   In Person  Provider:   You may see DR Harrell Gave END   Cardiac Nuclear Scan A cardiac nuclear scan is a test  that measures blood flow to the heart when a person is resting and when he or she is exercising. The test looks for problems such as:  Not enough blood reaching a portion of the heart.  The heart muscle not working normally. You may need this test if:  You have heart disease.  You have had abnormal lab results.  You have had heart surgery or a balloon procedure to open up blocked arteries (angioplasty).  You have chest pain.  You have shortness of breath. In this test, a radioactive dye (tracer) is injected into your bloodstream. After the tracer has traveled to your heart, an imaging device is used to measure how much of the tracer is absorbed by or distributed to various areas of your heart. This procedure is usually done at a hospital and takes 2-4 hours. Tell a health care provider about:  Any allergies you have.  All medicines you are taking, including vitamins, herbs, eye drops, creams, and over-the-counter medicines.  Any problems you or family members have had with anesthetic medicines.  Any blood disorders you have.  Any surgeries you have had.  Any medical conditions you have.  Whether you are pregnant or may be pregnant. What are the risks? Generally, this is a safe procedure. However, problems may occur, including:  Serious chest pain and heart attack. This is only a risk if the stress portion of the test is done.  Rapid heartbeat.  Sensation of warmth in your chest. This usually passes quickly.  Allergic reaction to the tracer. What happens before the procedure?  Ask your health care provider about changing or stopping your regular medicines. This is especially important if you are taking diabetes medicines or blood thinners.  Follow instructions from your health care provider about eating or drinking restrictions.  Remove your jewelry on the day of the procedure. What happens during the procedure?  An IV will be inserted into one of your veins.  Your  health care provider will inject a small amount of radioactive tracer through the IV.  You will wait for 20-40 minutes while the tracer travels through your bloodstream.  Your heart activity will be monitored with an electrocardiogram (ECG).  You will lie down on an exam table.  Images of your heart will be taken for about 15-20 minutes.  You may also have a stress test. For this test, one of the following may be done: ? You will exercise on a treadmill or stationary bike. While you exercise, your heart's activity will be monitored with an ECG, and your blood pressure will be checked. ? You will be given medicines that will increase blood flow to parts of your heart. This is done if you are unable to exercise.  When blood flow to your heart has peaked, a tracer will again be injected through the IV.  After 20-40 minutes, you will get back on the exam table and have more images taken of your heart.  Depending on the type of tracer used, scans may need to be repeated 3-4 hours later.  Your IV  line will be removed when the procedure is over. The procedure may vary among health care providers and hospitals. What happens after the procedure?  Unless your health care provider tells you otherwise, you may return to your normal schedule, including diet, activities, and medicines.  Unless your health care provider tells you otherwise, you may increase your fluid intake. This will help to flush the contrast dye from your body. Drink enough fluid to keep your urine pale yellow.  Ask your health care provider, or the department that is doing the test: ? When will my results be ready? ? How will I get my results? Summary  A cardiac nuclear scan measures the blood flow to the heart when a person is resting and when he or she is exercising.  Tell your health care provider if you are pregnant.  Before the procedure, ask your health care provider about changing or stopping your regular medicines.  This is especially important if you are taking diabetes medicines or blood thinners.  After the procedure, unless your health care provider tells you otherwise, increase your fluid intake. This will help flush the contrast dye from your body.  After the procedure, unless your health care provider tells you otherwise, you may return to your normal schedule, including diet, activities, and medicines. This information is not intended to replace advice given to you by your health care provider. Make sure you discuss any questions you have with your health care provider. Document Revised: 06/08/2017 Document Reviewed: 06/08/2017 Elsevier Patient Education  [place].
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
## 2020-03-22 ENCOUNTER — Other Ambulatory Visit: Payer: Self-pay | Admitting: Internal Medicine

## 2020-03-22 DIAGNOSIS — Z1231 Encounter for screening mammogram for malignant neoplasm of breast: Secondary | ICD-10-CM

## 2020-03-23 ENCOUNTER — Encounter: Payer: Self-pay | Admitting: Internal Medicine

## 2020-03-23 DIAGNOSIS — R0600 Dyspnea, unspecified: Secondary | ICD-10-CM | POA: Insufficient documentation

## 2020-03-23 DIAGNOSIS — R0789 Other chest pain: Secondary | ICD-10-CM | POA: Insufficient documentation

## 2020-03-23 DIAGNOSIS — R0609 Other forms of dyspnea: Secondary | ICD-10-CM | POA: Insufficient documentation

## 2020-03-30 ENCOUNTER — Other Ambulatory Visit: Payer: Self-pay

## 2020-03-30 ENCOUNTER — Other Ambulatory Visit
Admission: RE | Admit: 2020-03-30 | Discharge: 2020-03-30 | Disposition: A | Discharge status: Home / Self Care | Payer: PPO | Source: Ambulatory Visit | Attending: Internal Medicine | Admitting: Internal Medicine

## 2020-03-30 DIAGNOSIS — Z20822 Contact with and (suspected) exposure to covid-19: Secondary | ICD-10-CM | POA: Diagnosis not present

## 2020-03-30 DIAGNOSIS — Z01812 Encounter for preprocedural laboratory examination: Secondary | ICD-10-CM | POA: Diagnosis not present

## 2020-03-30 LAB — SARS CORONAVIRUS 2 (TAT 6-24 HRS): SARS Coronavirus 2: NEGATIVE

## 2020-03-31 ENCOUNTER — Other Ambulatory Visit: Payer: Self-pay | Admitting: Internal Medicine

## 2020-04-02 ENCOUNTER — Encounter
Admission: RE | Admit: 2020-04-02 | Discharge: 2020-04-02 | Disposition: A | Discharge status: Home / Self Care | Payer: PPO | Source: Ambulatory Visit | Attending: Internal Medicine | Admitting: Internal Medicine

## 2020-04-02 ENCOUNTER — Other Ambulatory Visit: Payer: Self-pay

## 2020-04-02 DIAGNOSIS — R9431 Abnormal electrocardiogram [ECG] [EKG]: Secondary | ICD-10-CM

## 2020-04-02 LAB — NM MYOCAR MULTI W/SPECT W/WALL MOTION / EF
Estimated workload: 1 METS
Exercise duration (min): 0 min
Exercise duration (sec): 0 s
LV dias vol: 69 mL (ref 46–106)
LV sys vol: 12 mL
MPHR: 155 {beats}/min
Peak HR: 120 {beats}/min
Percent HR: 77 %
Rest HR: 74 {beats}/min
SDS: 4
SRS: 8
SSS: 5
TID: 0.6

## 2020-04-02 MED ORDER — TECHNETIUM TC 99M TETROFOSMIN IV KIT
30.0000 | PACK | Freq: Once | INTRAVENOUS | Status: AC | PRN
  Administered 2020-04-02: 29.59 via INTRAVENOUS

## 2020-04-02 MED ORDER — TECHNETIUM TC 99M TETROFOSMIN IV KIT
10.0000 | PACK | Freq: Once | INTRAVENOUS | Status: AC | PRN
  Administered 2020-04-02: 10.1 via INTRAVENOUS

## 2020-04-02 MED ORDER — REGADENOSON 0.4 MG/5ML IV SOLN
0.4000 mg | Freq: Once | INTRAVENOUS | Status: AC
  Administered 2020-04-02: 0.4 mg via INTRAVENOUS

## 2020-04-03 ENCOUNTER — Telehealth: Payer: Self-pay | Admitting: Internal Medicine

## 2020-04-03 NOTE — Telephone Encounter (Signed)
Spoke with patient and relayed Dr. Marisue Humble result note as stated below.  Patient verbalized understanding and was very grateful for the follow up.

## 2020-04-03 NOTE — Telephone Encounter (Signed)
Nelva Bush, MD  04/02/2020 5:01 PM EDT      Please let Ms. Poplaski know that her stress test is normal without evidence of a significant narrowing or blockage in her heart arteries. She should follow-up at her convenience if her symptoms persist. Otherwise, she can return to see [place] on a PRN basis. I will forward these results to Dr. Derrel Nip as well for her records.

## 2020-04-03 NOTE — Telephone Encounter (Signed)
Attempted to call the patient. No answer- I left a message to please call back.  

## 2020-04-24 ENCOUNTER — Ambulatory Visit
Admission: RE | Admit: 2020-04-24 | Discharge: 2020-04-24 | Disposition: A | Discharge status: Home / Self Care | Payer: PPO | Source: Ambulatory Visit | Attending: Internal Medicine | Admitting: Internal Medicine

## 2020-04-24 ENCOUNTER — Other Ambulatory Visit: Payer: Self-pay

## 2020-04-24 DIAGNOSIS — Z780 Asymptomatic menopausal state: Secondary | ICD-10-CM

## 2020-04-24 DIAGNOSIS — Z1231 Encounter for screening mammogram for malignant neoplasm of breast: Secondary | ICD-10-CM | POA: Diagnosis not present

## 2020-04-24 DIAGNOSIS — M8580 Other specified disorders of bone density and structure, unspecified site: Secondary | ICD-10-CM

## 2020-05-23 ENCOUNTER — Other Ambulatory Visit: Payer: Self-pay | Admitting: Internal Medicine

## 2020-05-24 ENCOUNTER — Telehealth: Payer: Self-pay | Admitting: Internal Medicine

## 2020-05-24 NOTE — Telephone Encounter (Signed)
Patient has a cough/head congestion/coughing up green mucus. No fever, has been going on for couple of weeks. Patient scheduled with Dr. Aundra Dubin virtual appt.

## 2020-05-24 NOTE — Telephone Encounter (Signed)
Noted Patient scheduled for 05/25/20

## 2020-05-25 ENCOUNTER — Encounter: Payer: Self-pay | Admitting: Internal Medicine

## 2020-05-25 ENCOUNTER — Telehealth (INDEPENDENT_AMBULATORY_CARE_PROVIDER_SITE_OTHER): Payer: PPO | Admitting: Internal Medicine

## 2020-05-25 ENCOUNTER — Other Ambulatory Visit: Payer: Self-pay

## 2020-05-25 VITALS — Ht 64.0 in | Wt 189.0 lb

## 2020-05-25 DIAGNOSIS — J309 Allergic rhinitis, unspecified: Secondary | ICD-10-CM

## 2020-05-25 DIAGNOSIS — J0111 Acute recurrent frontal sinusitis: Secondary | ICD-10-CM | POA: Diagnosis not present

## 2020-05-25 MED ORDER — AMOXICILLIN-POT CLAVULANATE 875-125 MG PO TABS: 1.0000 | ORAL_TABLET | Freq: Two times a day (BID) | ORAL | 0 refills | Status: DC

## 2020-05-25 MED ORDER — PREDNISONE 20 MG PO TABS: 40.0000 mg | ORAL_TABLET | Freq: Every day | ORAL | 0 refills | Status: DC
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
## 2020-05-25 NOTE — Progress Notes (Signed)
Patient having cough, head congestion, green mucous. Nasal drainage is clear going down the throat. Was greenish brown and is now yellowish brown.  Patient has history of sinus infections.   Patient's husband is sick as well. They both worked outdoors Monday and both have history of allergies.   Patient did a home Covid test yesterday that was negative

## 2020-06-15 ENCOUNTER — Other Ambulatory Visit: Payer: Self-pay | Admitting: Internal Medicine

## 2020-06-24 ENCOUNTER — Other Ambulatory Visit: Payer: Self-pay | Admitting: Internal Medicine

## 2020-06-27 ENCOUNTER — Telehealth: Payer: Self-pay | Admitting: Internal Medicine

## 2020-06-27 NOTE — Telephone Encounter (Signed)
  Please call [phone] option [phone]  Ref #[id number] They are needing the clinical questions answered for the PA for VENTOLIN HFA 108 (90 Base) MCG/ACT inhaler

## 2020-06-27 NOTE — Telephone Encounter (Signed)
Pt stated that she does not use this medication any longer. Spoke with covermymeds to cancel the PA request.

## 2020-06-29 NOTE — Telephone Encounter (Signed)
Entered into pts chart.

## 2020-08-21 DIAGNOSIS — G5603 Carpal tunnel syndrome, bilateral upper limbs: Secondary | ICD-10-CM | POA: Diagnosis not present

## 2020-08-21 DIAGNOSIS — M180 Bilateral primary osteoarthritis of first carpometacarpal joints: Secondary | ICD-10-CM | POA: Diagnosis not present

## 2020-09-22 ENCOUNTER — Other Ambulatory Visit: Payer: Self-pay | Admitting: Internal Medicine

## 2020-09-25 DIAGNOSIS — M180 Bilateral primary osteoarthritis of first carpometacarpal joints: Secondary | ICD-10-CM | POA: Diagnosis not present

## 2020-09-25 DIAGNOSIS — G5603 Carpal tunnel syndrome, bilateral upper limbs: Secondary | ICD-10-CM | POA: Diagnosis not present

## 2020-10-22 ENCOUNTER — Other Ambulatory Visit: Payer: Self-pay

## 2020-10-22 ENCOUNTER — Ambulatory Visit (INDEPENDENT_AMBULATORY_CARE_PROVIDER_SITE_OTHER): Payer: PPO

## 2020-10-22 DIAGNOSIS — Z23 Encounter for immunization: Secondary | ICD-10-CM | POA: Diagnosis not present

## 2020-11-10 ENCOUNTER — Ambulatory Visit
Admission: EM | Admit: 2020-11-10 | Discharge: 2020-11-10 | Disposition: A | Discharge status: Home / Self Care | Payer: PPO | Attending: Emergency Medicine | Admitting: Emergency Medicine

## 2020-11-10 ENCOUNTER — Other Ambulatory Visit: Payer: Self-pay

## 2020-11-10 ENCOUNTER — Encounter: Payer: Self-pay | Admitting: Emergency Medicine

## 2020-11-10 DIAGNOSIS — B349 Viral infection, unspecified: Secondary | ICD-10-CM | POA: Diagnosis not present

## 2020-11-10 LAB — POCT INFLUENZA A/B
Influenza A, POC: NEGATIVE
Influenza B, POC: NEGATIVE
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
## 2020-11-10 NOTE — Discharge Instructions (Addendum)
Your flu test is negative.  Your COVID test is pending.  You should self quarantine until the test result is back.    Take Tylenol or ibuprofen as needed for fever or discomfort.  Rest and keep yourself hydrated.    Follow-up with your primary care provider if your symptoms are not improving.

## 2020-11-10 NOTE — ED Triage Notes (Signed)
Pt here with congestion, fever, sinus pressure and sore throat since Thursday night.

## 2020-11-11 LAB — NOVEL CORONAVIRUS, NAA: SARS-CoV-2, NAA: NOT DETECTED

## 2020-11-11 LAB — SARS-COV-2, NAA 2 DAY TAT

## 2020-11-13 ENCOUNTER — Ambulatory Visit (INDEPENDENT_AMBULATORY_CARE_PROVIDER_SITE_OTHER): Payer: PPO | Admitting: Family

## 2020-11-13 ENCOUNTER — Other Ambulatory Visit: Payer: Self-pay

## 2020-11-13 ENCOUNTER — Encounter: Payer: Self-pay | Admitting: Family

## 2020-11-13 VITALS — BP 130/78 | HR 111 | Temp 98.8°F | Ht 64.02 in | Wt 183.8 lb

## 2020-11-13 DIAGNOSIS — B9689 Other specified bacterial agents as the cause of diseases classified elsewhere: Secondary | ICD-10-CM | POA: Diagnosis not present

## 2020-11-13 DIAGNOSIS — J019 Acute sinusitis, unspecified: Secondary | ICD-10-CM

## 2020-11-13 DIAGNOSIS — R52 Pain, unspecified: Secondary | ICD-10-CM | POA: Diagnosis not present

## 2020-11-13 LAB — POCT INFLUENZA A/B
Influenza A, POC: NEGATIVE
Influenza B, POC: NEGATIVE

## 2020-11-13 MED ORDER — AMOXICILLIN 500 MG PO CAPS: 500.0000 mg | ORAL_CAPSULE | Freq: Three times a day (TID) | ORAL | 0 refills | Status: AC
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
## 2020-11-22 ENCOUNTER — Other Ambulatory Visit: Payer: Self-pay | Admitting: Internal Medicine

## 2020-11-27 DIAGNOSIS — J0140 Acute pansinusitis, unspecified: Secondary | ICD-10-CM | POA: Diagnosis not present

## 2020-12-11 DIAGNOSIS — H9042 Sensorineural hearing loss, unilateral, left ear, with unrestricted hearing on the contralateral side: Secondary | ICD-10-CM | POA: Diagnosis not present

## 2020-12-11 DIAGNOSIS — H6122 Impacted cerumen, left ear: Secondary | ICD-10-CM | POA: Diagnosis not present

## 2020-12-11 DIAGNOSIS — J0140 Acute pansinusitis, unspecified: Secondary | ICD-10-CM | POA: Diagnosis not present

## 2020-12-11 DIAGNOSIS — H903 Sensorineural hearing loss, bilateral: Secondary | ICD-10-CM | POA: Diagnosis not present

## 2020-12-12 ENCOUNTER — Other Ambulatory Visit: Payer: Self-pay | Admitting: Unknown Physician Specialty

## 2020-12-12 DIAGNOSIS — H9042 Sensorineural hearing loss, unilateral, left ear, with unrestricted hearing on the contralateral side: Secondary | ICD-10-CM

## 2020-12-13 ENCOUNTER — Other Ambulatory Visit: Payer: Self-pay | Admitting: Internal Medicine

## 2020-12-25 ENCOUNTER — Other Ambulatory Visit: Payer: Self-pay

## 2020-12-25 ENCOUNTER — Ambulatory Visit
Admission: RE | Admit: 2020-12-25 | Discharge: 2020-12-25 | Disposition: A | Discharge status: Home / Self Care | Payer: PPO | Source: Ambulatory Visit | Attending: Unknown Physician Specialty | Admitting: Unknown Physician Specialty

## 2020-12-25 DIAGNOSIS — J3489 Other specified disorders of nose and nasal sinuses: Secondary | ICD-10-CM | POA: Diagnosis not present

## 2020-12-25 DIAGNOSIS — H9042 Sensorineural hearing loss, unilateral, left ear, with unrestricted hearing on the contralateral side: Secondary | ICD-10-CM

## 2020-12-25 DIAGNOSIS — H9192 Unspecified hearing loss, left ear: Secondary | ICD-10-CM | POA: Diagnosis not present

## 2020-12-25 DIAGNOSIS — J329 Chronic sinusitis, unspecified: Secondary | ICD-10-CM | POA: Diagnosis not present

## 2020-12-25 MED ORDER — GADOBENATE DIMEGLUMINE 529 MG/ML IV SOLN
17.0000 mL | Freq: Once | INTRAVENOUS | Status: AC | PRN
  Administered 2020-12-25: 10:00:00 17 mL via INTRAVENOUS

## 2020-12-28 DIAGNOSIS — H2513 Age-related nuclear cataract, bilateral: Secondary | ICD-10-CM | POA: Diagnosis not present

## 2021-01-15 DIAGNOSIS — M180 Bilateral primary osteoarthritis of first carpometacarpal joints: Secondary | ICD-10-CM | POA: Diagnosis not present

## 2021-01-15 DIAGNOSIS — G5603 Carpal tunnel syndrome, bilateral upper limbs: Secondary | ICD-10-CM | POA: Diagnosis not present

## 2021-01-24 DIAGNOSIS — D2262 Melanocytic nevi of left upper limb, including shoulder: Secondary | ICD-10-CM | POA: Diagnosis not present

## 2021-01-24 DIAGNOSIS — L821 Other seborrheic keratosis: Secondary | ICD-10-CM | POA: Diagnosis not present

## 2021-01-24 DIAGNOSIS — L538 Other specified erythematous conditions: Secondary | ICD-10-CM | POA: Diagnosis not present

## 2021-01-24 DIAGNOSIS — B078 Other viral warts: Secondary | ICD-10-CM | POA: Diagnosis not present

## 2021-01-24 DIAGNOSIS — D2271 Melanocytic nevi of right lower limb, including hip: Secondary | ICD-10-CM | POA: Diagnosis not present

## 2021-01-24 DIAGNOSIS — Z85828 Personal history of other malignant neoplasm of skin: Secondary | ICD-10-CM | POA: Diagnosis not present

## 2021-01-24 DIAGNOSIS — D225 Melanocytic nevi of trunk: Secondary | ICD-10-CM | POA: Diagnosis not present

## 2021-02-27 ENCOUNTER — Encounter: Payer: Self-pay | Admitting: Internal Medicine

## 2021-03-05 ENCOUNTER — Other Ambulatory Visit: Payer: Self-pay | Admitting: Internal Medicine

## 2021-03-18 ENCOUNTER — Encounter: Payer: PPO | Admitting: Internal Medicine

## 2021-03-22 ENCOUNTER — Other Ambulatory Visit: Payer: Self-pay

## 2021-03-22 ENCOUNTER — Encounter: Payer: Self-pay | Admitting: Internal Medicine

## 2021-03-22 ENCOUNTER — Ambulatory Visit (INDEPENDENT_AMBULATORY_CARE_PROVIDER_SITE_OTHER): Payer: PPO | Admitting: Internal Medicine

## 2021-03-22 VITALS — BP 128/80 | HR 78 | Temp 98.1°F | Ht 64.0 in | Wt 190.8 lb

## 2021-03-22 DIAGNOSIS — Z1231 Encounter for screening mammogram for malignant neoplasm of breast: Secondary | ICD-10-CM | POA: Diagnosis not present

## 2021-03-22 DIAGNOSIS — E669 Obesity, unspecified: Secondary | ICD-10-CM

## 2021-03-22 DIAGNOSIS — K5730 Diverticulosis of large intestine without perforation or abscess without bleeding: Secondary | ICD-10-CM

## 2021-03-22 DIAGNOSIS — H269 Unspecified cataract: Secondary | ICD-10-CM | POA: Insufficient documentation

## 2021-03-22 DIAGNOSIS — R7301 Impaired fasting glucose: Secondary | ICD-10-CM

## 2021-03-22 DIAGNOSIS — R5383 Other fatigue: Secondary | ICD-10-CM | POA: Diagnosis not present

## 2021-03-22 DIAGNOSIS — Z974 Presence of external hearing-aid: Secondary | ICD-10-CM | POA: Diagnosis not present

## 2021-03-22 DIAGNOSIS — L659 Nonscarring hair loss, unspecified: Secondary | ICD-10-CM | POA: Diagnosis not present

## 2021-03-22 DIAGNOSIS — E785 Hyperlipidemia, unspecified: Secondary | ICD-10-CM | POA: Diagnosis not present

## 2021-03-22 DIAGNOSIS — L658 Other specified nonscarring hair loss: Secondary | ICD-10-CM

## 2021-03-22 DIAGNOSIS — H259 Unspecified age-related cataract: Secondary | ICD-10-CM

## 2021-03-22 LAB — COMPREHENSIVE METABOLIC PANEL
ALT: 15 U/L (ref 0–35)
AST: 15 U/L (ref 0–37)
Albumin: 4.2 g/dL (ref 3.5–5.2)
Alkaline Phosphatase: 60 U/L (ref 39–117)
BUN: 12 mg/dL (ref 6–23)
CO2: 31 mEq/L (ref 19–32)
Calcium: 9.6 mg/dL (ref 8.4–10.5)
Chloride: 101 mEq/L (ref 96–112)
Creatinine, Ser: 0.82 mg/dL (ref 0.40–1.20)
GFR: 74.4 mL/min (ref >60.00)
Glucose, Bld: 116 mg/dL — ABNORMAL HIGH (ref 70–99)
Potassium: 4.6 mEq/L (ref 3.5–5.1)
Sodium: 138 mEq/L (ref 135–145)
Total Bilirubin: 0.6 mg/dL (ref 0.2–1.2)
Total Protein: 6.9 g/dL (ref 6.0–8.3)

## 2021-03-22 LAB — CBC WITH DIFFERENTIAL/PLATELET
Basophils Absolute: 0 10*3/uL (ref 0.0–0.1)
Basophils Relative: 0.8 % (ref 0.0–3.0)
Eosinophils Absolute: 0.1 10*3/uL (ref 0.0–0.7)
Eosinophils Relative: 1.8 % (ref 0.0–5.0)
HCT: 41.2 % (ref 36.0–46.0)
Hemoglobin: 13.4 g/dL (ref 12.0–15.0)
Lymphocytes Relative: 41.5 % (ref 12.0–46.0)
Lymphs Abs: 1.7 10*3/uL (ref 0.7–4.0)
MCHC: 32.5 g/dL (ref 30.0–36.0)
MCV: 84.8 fl (ref 78.0–100.0)
Monocytes Absolute: 0.3 10*3/uL (ref 0.1–1.0)
Monocytes Relative: 6.9 % (ref 3.0–12.0)
Neutro Abs: 2.1 10*3/uL (ref 1.4–7.7)
Neutrophils Relative %: 49 % (ref 43.0–77.0)
Platelets: 237 10*3/uL (ref 150.0–400.0)
RBC: 4.86 Mil/uL (ref 3.87–5.11)
RDW: 14.3 % (ref 11.5–15.5)
WBC: 4.2 10*3/uL (ref 4.0–10.5)

## 2021-03-22 LAB — HEMOGLOBIN A1C: Hgb A1c MFr Bld: 6.5 % (ref 4.6–6.5)

## 2021-03-22 LAB — LIPID PANEL
Cholesterol: 191 mg/dL (ref 0–200)
HDL: 71.9 mg/dL (ref >39.00)
LDL Cholesterol: 93 mg/dL (ref 0–99)
NonHDL: 119.36
Total CHOL/HDL Ratio: 3
Triglycerides: 132 mg/dL (ref 0.0–149.0)
VLDL: 26.4 mg/dL (ref 0.0–40.0)

## 2021-03-22 LAB — TESTOSTERONE: Testosterone: 0 ng/dL — ABNORMAL LOW (ref 15.00–40.00)

## 2021-03-22 LAB — TSH: TSH: 3.63 u[IU]/mL (ref 0.35–5.50)

## 2021-03-22 NOTE — Assessment & Plan Note (Addendum)
Checking thyroid and  Testosterone levels.  ? ?Lab Results  ?Component Value Date  ? TSH 3.63 03/22/2021  ? ?Lab Results  ?Component Value Date  ? TESTOSTERONE 0.00 (L) 03/22/2021  ? ? ?

## 2021-03-22 NOTE — Patient Instructions (Addendum)
Try using a Hair skin and nails MVI   ? ?I  may start spironolactone after I see labs today  to see if we can slow down the hair loss ? ?I recommend scheduling an appt with your dermatologist for further evaluation and treatment  ? ?Your annual mammogram has been ordered.  You are encouraged (required) to call to make your appointment at [facility]  ? ? ? ? ? ? ? ?

## 2021-03-22 NOTE — Progress Notes (Signed)
Patient ID: Kathleen Russell, female    DOB: 13-Mar-1955  Age: 66 y.o. MRN: [id number] ? ?The patient is here for follow up and  management of other chronic and acute problems. ? ?This visit occurred during the SARS-CoV-2 public health emergency.  Safety protocols were in place, including screening questions prior to the visit, additional usage of staff PPE, and extensive cleaning of exam room while observing appropriate contact time as indicated for disinfecting solutions.  ? ?  ?The risk factors are reflected in the social history. ? ?The roster of all physicians providing medical care to patient - is listed in the Snapshot section of the chart. ? ?Activities of daily living:  The patient is 100% independent in all ADLs: dressing, toileting, feeding as well as independent mobility ? ?Home safety : The patient has smoke detectors in the home. They wear seatbelts.  There are no firearms at home. There is no violence in the home.  ? ?There is no risks for hepatitis, STDs or HIV. There is no   history of blood transfusion. They have no travel history to infectious disease endemic areas of the world. ? ?The patient has seen their dentist in the last six month. They have seen their eye doctor in the last year. She has obtained hearing aides  She doe not  have excessive sun exposure. Discussed the need for sun protection: hats, long sleeves and use of sunscreen if there is significant sun exposure.  ? ?Diet: the importance of a healthy diet is discussed. They do have a healthy diet. ? ?The benefits of regular aerobic exercise were discussed. She and husband work out at [employer]  4 times per week ,  60 minutes.  ? ?Depression screen: there are no signs or vegative symptoms of depression- irritability, change in appetite, anhedonia, sadness/tearfullness. ? ?Cognitive assessment: the patient manages all their financial and personal affairs and is actively engaged. They could relate day,date,year and events; recalled 2/3  objects at 3 minutes; performed clock-face test normally. ? ?The following portions of the patient's history were reviewed and updated as appropriate: allergies, current medications, past family history, past medical history,  past surgical history, past social history  and problem list. ? ?Visual acuity was not assessed per patient preference since she has regular follow up with her ophthalmologist. Hearing and body mass index were assessed and reviewed.  ? ?During the course of the visit the patient was educated and counseled about appropriate screening and preventive services including : fall prevention , diabetes screening, nutrition counseling, colorectal cancer screening, and recommended immunizations.   ? ?CC: The primary encounter diagnosis was Hyperlipidemia LDL goal <160. Diagnoses of Fatigue, unspecified type, Impaired fasting glucose, Encounter for screening mammogram for malignant neoplasm of breast, Female pattern hair loss, Wears hearing aid, Age-related cataract of both eyes, unspecified age-related cataract type, Hair thinning, Obesity (BMI 30-39.9), Breast cancer screening by mammogram, and Diverticulosis of colon without hemorrhage were also pertinent to this visit. ? ?1) losing her hair.  Gradual occurrence.  No recent stressors.  Top of head,  not sides  ? ? ?History ?Kathleen Russell has a past medical history of allergic rhinitis, Broken ankle, Chicken pox, COVID-19 (01/14/2020), GERD (gastroesophageal reflux disease), History of broken nose, UTI (urinary tract infection), Kidney infection, Menopause, premature, Raynaud's phenomenon, and Rhinitis, nonallergic.  ? ?She has a past surgical history that includes Tonsillectomy and adenoidectomy; Abdominal hysterectomy; Eye surgery; Cesarean section; Total abdominal hysterectomy w/ bilateral salpingoophorectomy (2000); ORIF ankle fracture (1980); Colonoscopy  with propofol (N/A, 04/09/2016); and Cholecystectomy (N/A, 06/26/2018).  ? ?Her family history includes  Alcohol abuse in her maternal uncle; Arthritis in her mother; Breast cancer in her maternal aunt; Cancer in her paternal uncle; Diabetes in her father, mother, paternal aunt, paternal uncle, and sister; Heart disease in her father and paternal uncle; Hyperlipidemia in her father; Hypertension in her father and paternal uncle.She reports that she has never smoked. She has never used smokeless tobacco. She reports current alcohol use. She reports that she does not use drugs. ? ?Outpatient Medications Prior to Visit  ?Medication Sig Dispense Refill  ? acetaminophen (TYLENOL) 500 MG tablet Take by mouth.    ? cetirizine (ZYRTEC) 10 MG tablet Take 10 mg by mouth daily.    ? esomeprazole (NEXIUM) 40 MG capsule TAKE 1 CAPSULE BY MOUTH DAILY BEFORE BREAKFAST 30 capsule 2  ? estradiol (ESTRACE) 0.1 MG/GM vaginal cream INSERT 1 APPLICATORFUL VAGINALLY 3 TIMES A WEEK 42.5 g 12  ? fluticasone (FLONASE) 50 MCG/ACT nasal spray SHAKE LIQUID AND USE 2 SPRAYS IN EACH NOSTRIL AT BEDTIME AS NEEDED 16 g 5  ? metroNIDAZOLE (METROGEL) 1 % gel     ? Multiple Vitamin (MULTIVITAMIN) tablet Take 1 tablet by mouth daily.    ? valACYclovir (VALTREX) 1000 MG tablet Take 1 tablet (1,000 mg total) by mouth 3 (three) times daily. KEEP ON FILE FOR FUTURE REFILLS 21 tablet 1  ? VENTOLIN HFA 108 (90 Base) MCG/ACT inhaler Inhale 2 puffs into the lungs every 6 (six) hours as needed. 1 each 1  ? meloxicam (MOBIC) 15 MG tablet TAKE 1 TABLET(15 MG) BY MOUTH DAILY AS NEEDED FOR JOINT PAIN (Patient not taking: Reported on 03/22/2021) 30 tablet 3  ? ?No facility-administered medications prior to visit.  ? ? ?Review of Systems ? ?Patient denies headache, fevers, malaise, unintentional weight loss, skin rash, eye pain, sinus congestion and sinus pain, sore throat, dysphagia,  hemoptysis , cough, dyspnea, wheezing, chest pain, palpitations, orthopnea, edema, abdominal pain, nausea, melena, diarrhea, constipation, flank pain, dysuria, hematuria, urinary   Frequency, nocturia, numbness, tingling, seizures,  Focal weakness, Loss of consciousness,  Tremor, insomnia, depression, anxiety, and suicidal ideation.   ? ? ?Objective:  ?BP 128/80 (BP Location: Left Arm, Patient Position: Sitting, Cuff Size: Large)   Pulse 78   Temp 98.1 ?F (36.7 ?C) (Oral)   Ht '5\' 4"'$  (1.626 m)   Wt 190 lb 12.8 oz (86.5 kg)   SpO2 98%   BMI 32.75 kg/m?  ? ?Physical Exam ? ?General appearance: alert, cooperative and appears stated age ?Scalp:  significant thinning of hair limited to crown  ?Head: Normocephalic, without obvious abnormality, atraumatic ?Eyes: conjunctivae/corneas clear. PERRL, EOM's intact. Fundi benign. ?Ears: normal TM's and external ear canals both ears ?Nose: Nares normal. Septum midline. Mucosa normal. No drainage or sinus tenderness. ?Throat: lips, mucosa, and tongue normal; teeth and gums normal ?Neck: no adenopathy, no carotid bruit, no JVD, supple, symmetrical, trachea midline and thyroid not enlarged, symmetric, no tenderness/mass/nodules ?Lungs: clear to auscultation bilaterally ?Breasts: normal appearance, no masses or tenderness ?Heart: regular rate and rhythm, S1, S2 normal, no murmur, click, rub or gallop ?Abdomen: soft, non-tender; bowel sounds normal; no masses,  no organomegaly ?Extremities: extremities normal, atraumatic, no cyanosis or edema ?Pulses: 2+ and symmetric ?Skin: Skin color, texture, turgor normal. No rashes or lesions ?Neurologic: Alert and oriented X 3, normal strength and tone. Normal symmetric reflexes. Normal coordination and gait.    ? ? ?Assessment & Plan:  ? ?  Problem List Items Addressed This Visit   ? ? Obesity (BMI 30-39.9)  ?  I have addressed  BMI and recommended wt loss of 10% of body weigh over the next 6 months using a low glycemic index diet and regular exercise a minimum of 5 days per week.  ? ?  ?  ? Hyperlipidemia LDL goal <160 - Primary  ? Relevant Orders  ? Lipid Profile (Completed)  ? Diverticulosis of colon without  hemorrhage  ?  By screening colonoscopy 2018.  Follow up in 2028 ?  ?  ? Breast cancer screening by mammogram  ?  Normal mammogram April 2022 ?  ?  ? Female pattern hair loss  ?  Thyroid and testosterone levels are norml

## 2021-03-23 MED ORDER — MINOXIDIL FOR WOMEN 2 % EX SOLN: Freq: Two times a day (BID) | CUTANEOUS | 0 refills | Status: DC

## 2021-03-23 NOTE — Assessment & Plan Note (Addendum)
Normal mammogram April 2022 ?

## 2021-03-23 NOTE — Assessment & Plan Note (Signed)
I have addressed  BMI and recommended wt loss of 10% of body weigh over the next 6 months using a low glycemic index diet and regular exercise a minimum of 5 days per week.   

## 2021-03-23 NOTE — Assessment & Plan Note (Signed)
By screening colonoscopy 2018.  Follow up in 2028 ?

## 2021-03-23 NOTE — Assessment & Plan Note (Signed)
Thyroid and testosterone levels are normla.  Trial of minoxidil  cream ? ?

## 2021-03-25 ENCOUNTER — Telehealth: Payer: Self-pay | Admitting: Internal Medicine

## 2021-03-25 NOTE — Telephone Encounter (Signed)
Copied from [place] [phone]. Topic: Medicare AWV ?>> Mar 25, 2021 10:32 AM Harris-Coley, Hannah Beat wrote: ?Reason for CRM: Left message for patient to schedule Annual Wellness Visit.  Please schedule with Nurse Health Advisor Denisa O'Brien-Blaney, LPN at [facility].  Please call [phone] ask for Juliann Pulse ?

## 2021-04-23 ENCOUNTER — Ambulatory Visit (INDEPENDENT_AMBULATORY_CARE_PROVIDER_SITE_OTHER): Payer: PPO

## 2021-04-23 VITALS — Ht 64.0 in | Wt 190.0 lb

## 2021-04-23 DIAGNOSIS — Z0000 Encounter for general adult medical examination without abnormal findings: Secondary | ICD-10-CM | POA: Diagnosis not present
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
## 2021-04-23 NOTE — Patient Instructions (Addendum)
?  Ms. Tuch , ?Thank you for taking time to come for your Medicare Wellness Visit. I appreciate your ongoing commitment to your health goals. Please review the following plan we discussed and let me know if I can assist you in the future.  ? ?These are the goals we discussed: ? Goals   ? ?  ? Patient Stated  ?   Increase physical activity (pt-stated)   ? ?  ?  ?This is a list of the screening recommended for you and due dates:  ?Health Maintenance  ?Topic Date Due  ? Mammogram  10/24/2020  ? Flu Shot  08/06/2021  ? Colon Cancer Screening  04/10/2026  ? Tetanus Vaccine  12/26/2029  ? Pneumonia Vaccine  Completed  ? DEXA scan (bone density measurement)  Completed  ? COVID-19 Vaccine  Completed  ? Hepatitis C Screening: USPSTF Recommendation to screen - Ages 66-79 yo.  Completed  ? Zoster (Shingles) Vaccine  Completed  ? HPV Vaccine  Aged Out  ?  ?

## 2021-04-24 DIAGNOSIS — G5602 Carpal tunnel syndrome, left upper limb: Secondary | ICD-10-CM | POA: Diagnosis not present

## 2021-04-24 DIAGNOSIS — M180 Bilateral primary osteoarthritis of first carpometacarpal joints: Secondary | ICD-10-CM | POA: Diagnosis not present

## 2021-04-24 DIAGNOSIS — M654 Radial styloid tenosynovitis [de Quervain]: Secondary | ICD-10-CM | POA: Diagnosis not present

## 2021-04-29 DIAGNOSIS — Z4889 Encounter for other specified surgical aftercare: Secondary | ICD-10-CM | POA: Diagnosis not present

## 2021-04-29 DIAGNOSIS — M25542 Pain in joints of left hand: Secondary | ICD-10-CM | POA: Diagnosis not present

## 2021-04-29 DIAGNOSIS — M180 Bilateral primary osteoarthritis of first carpometacarpal joints: Secondary | ICD-10-CM | POA: Diagnosis not present

## 2021-04-29 DIAGNOSIS — M25642 Stiffness of left hand, not elsewhere classified: Secondary | ICD-10-CM | POA: Diagnosis not present

## 2021-04-29 DIAGNOSIS — G5603 Carpal tunnel syndrome, bilateral upper limbs: Secondary | ICD-10-CM | POA: Diagnosis not present

## 2021-05-15 ENCOUNTER — Ambulatory Visit
Admission: RE | Admit: 2021-05-15 | Discharge: 2021-05-15 | Disposition: A | Discharge status: Home / Self Care | Payer: PPO | Source: Ambulatory Visit | Attending: Internal Medicine | Admitting: Internal Medicine

## 2021-05-15 DIAGNOSIS — Z1231 Encounter for screening mammogram for malignant neoplasm of breast: Secondary | ICD-10-CM | POA: Diagnosis not present

## 2021-05-24 ENCOUNTER — Other Ambulatory Visit: Payer: Self-pay | Admitting: Internal Medicine

## 2021-05-27 DIAGNOSIS — M25642 Stiffness of left hand, not elsewhere classified: Secondary | ICD-10-CM | POA: Diagnosis not present

## 2021-05-27 DIAGNOSIS — G5603 Carpal tunnel syndrome, bilateral upper limbs: Secondary | ICD-10-CM | POA: Diagnosis not present

## 2021-05-27 DIAGNOSIS — M25631 Stiffness of right wrist, not elsewhere classified: Secondary | ICD-10-CM | POA: Diagnosis not present

## 2021-05-27 DIAGNOSIS — M25632 Stiffness of left wrist, not elsewhere classified: Secondary | ICD-10-CM | POA: Diagnosis not present

## 2021-05-27 DIAGNOSIS — M180 Bilateral primary osteoarthritis of first carpometacarpal joints: Secondary | ICD-10-CM | POA: Diagnosis not present

## 2021-06-10 ENCOUNTER — Encounter: Payer: Self-pay | Admitting: Internal Medicine

## 2021-06-10 ENCOUNTER — Ambulatory Visit (INDEPENDENT_AMBULATORY_CARE_PROVIDER_SITE_OTHER): Payer: PPO | Admitting: Internal Medicine

## 2021-06-10 VITALS — BP 132/84 | HR 83 | Temp 98.4°F | Ht 64.0 in | Wt 186.4 lb

## 2021-06-10 DIAGNOSIS — M25631 Stiffness of right wrist, not elsewhere classified: Secondary | ICD-10-CM | POA: Diagnosis not present

## 2021-06-10 DIAGNOSIS — E669 Obesity, unspecified: Secondary | ICD-10-CM | POA: Diagnosis not present

## 2021-06-10 DIAGNOSIS — G5603 Carpal tunnel syndrome, bilateral upper limbs: Secondary | ICD-10-CM | POA: Diagnosis not present

## 2021-06-10 DIAGNOSIS — M25542 Pain in joints of left hand: Secondary | ICD-10-CM | POA: Diagnosis not present

## 2021-06-10 DIAGNOSIS — M25642 Stiffness of left hand, not elsewhere classified: Secondary | ICD-10-CM | POA: Diagnosis not present

## 2021-06-10 DIAGNOSIS — E119 Type 2 diabetes mellitus without complications: Secondary | ICD-10-CM | POA: Insufficient documentation

## 2021-06-10 DIAGNOSIS — E1169 Type 2 diabetes mellitus with other specified complication: Secondary | ICD-10-CM | POA: Diagnosis not present

## 2021-06-10 DIAGNOSIS — M25632 Stiffness of left wrist, not elsewhere classified: Secondary | ICD-10-CM | POA: Diagnosis not present

## 2021-06-10 DIAGNOSIS — Z4889 Encounter for other specified surgical aftercare: Secondary | ICD-10-CM | POA: Diagnosis not present

## 2021-06-10 DIAGNOSIS — M180 Bilateral primary osteoarthritis of first carpometacarpal joints: Secondary | ICD-10-CM | POA: Diagnosis not present

## 2021-06-10 HISTORY — DX: Obesity, unspecified: E11.69

## 2021-06-10 LAB — MICROALBUMIN / CREATININE URINE RATIO
Creatinine,U: 38.6 mg/dL
Microalb Creat Ratio: 1.8 mg/g (ref 0.0–30.0)
Microalb, Ur: <0.7 mg/dL (ref 0.0–1.9)

## 2021-06-10 MED ORDER — BLOOD GLUCOSE METER KIT: PACK | 0 refills | Status: AC

## 2021-06-10 NOTE — Assessment & Plan Note (Signed)
Managed with repeated teroid injections,  None in 6 months,  And surgery on the left since in April 2023

## 2021-06-10 NOTE — Assessment & Plan Note (Signed)
In the setting of recurrent steroid injections in both wrists.  Repeat a1c is due in a few weeks.  Reviewed standards of care which will include statin therapy if diagnosis remains,  And trial of ozempic or metformin for management of diabetes.

## 2021-06-10 NOTE — Patient Instructions (Addendum)
Our A1c may have been artificially induced by the steroid injections you received.  You can use the glucometer to Check 2 hours 1) fasting and 2) after eating     Fasting sugars should be < 125 and post prandial sugars  < 160 .  No medications are advised at this time unless diabetes is confirmed with your next a1c  in 2 weeks

## 2021-06-10 NOTE — Progress Notes (Signed)
Subjective:  Patient ID: Kathleen Russell, female    DOB: Jul 18, 1955  Age: 66 y.o. MRN: [id number]  CC: The primary encounter diagnosis was Type 2 diabetes mellitus with obesity ([place]). A diagnosis of Carpal tunnel syndrome on both sides was also pertinent to this visit.   HPI ANZLEY DIBBERN presents for  new onset type 2 DM  Chief Complaint  Patient presents with   Follow-up    Follow up on discuss diabetes   1) T2DM:  had an unexpected A1c of 6.5 in March.  Previous a1c's have been < 6.0 and no fasting glucoses > 120.  However,  she hHad been receiving I/A steroid injections bilaterally in both wrists,  for degenerative arthritis.  Since notification, she has  reduced her sugar intake and is exercising regularly  .  Reviewed diet.  Breakfast:  greek yogurt with granola . She does recall that she was told that her fasting glucose was 148 preoperatively  2) Bilateral thumb pain.  Received steroid injections,  Last one 6 months ago.  Finally had the left CT release  along with a "left thumb suspensionplasty" on the  left wrist April  19  by orthopedic surgeon Dr.  Burney Gauze at [facility]  in [facility].  and planning to do the right side   3) Obesity :  has been losing weight gradually through diet and exercise.   Outpatient Medications Prior to Visit  Medication Sig Dispense Refill   acetaminophen (TYLENOL) 500 MG tablet Take by mouth.     cetirizine (ZYRTEC) 10 MG tablet Take 10 mg by mouth daily.     esomeprazole (NEXIUM) 40 MG capsule TAKE 1 CAPSULE BY MOUTH DAILY BEFORE BREAKFAST 30 capsule 2   estradiol (ESTRACE) 0.1 MG/GM vaginal cream INSERT 1 APPLICATORFUL VAGINALLY 3 TIMES A WEEK 42.5 g 12   fluticasone (FLONASE) 50 MCG/ACT nasal spray SHAKE LIQUID AND USE 2 SPRAYS IN EACH NOSTRIL AT BEDTIME AS NEEDED 16 g 5   metroNIDAZOLE (METROGEL) 1 % gel      Multiple Vitamin (MULTIVITAMIN) tablet Take 1 tablet by mouth daily.     valACYclovir (VALTREX) 1000 MG tablet Take 1 tablet (1,000 mg  total) by mouth 3 (three) times daily. KEEP ON FILE FOR FUTURE REFILLS 21 tablet 1   VENTOLIN HFA 108 (90 Base) MCG/ACT inhaler Inhale 2 puffs into the lungs every 6 (six) hours as needed. 1 each 1   minoxidil (MINOXIDIL FOR WOMEN) 2 % external solution Apply topically 2 (two) times daily. (Patient not taking: Reported on 06/10/2021) 60 mL 0   No facility-administered medications prior to visit.    Review of Systems;  Patient denies headache, fevers, malaise, unintentional weight loss, skin rash, eye pain, sinus congestion and sinus pain, sore throat, dysphagia,  hemoptysis , cough, dyspnea, wheezing, chest pain, palpitations, orthopnea, edema, abdominal pain, nausea, melena, diarrhea, constipation, flank pain, dysuria, hematuria, urinary  Frequency, nocturia, numbness, tingling, seizures,  Focal weakness, Loss of consciousness,  Tremor, insomnia, depression, anxiety, and suicidal ideation.      Objective:  BP 132/84 (BP Location: Left Arm, Patient Position: Sitting, Cuff Size: Large)   Pulse 83   Temp 98.4 F (36.9 C) (Oral)   Ht '5\' 4"'$  (1.626 m)   Wt 186 lb 6.4 oz (84.6 kg)   SpO2 96%   BMI 32.00 kg/m   BP Readings from Last 3 Encounters:  06/10/21 132/84  03/22/21 128/80  11/13/20 130/78    Wt Readings from Last 3 Encounters:  06/10/21 186 lb 6.4 oz (84.6 kg)  04/23/21 190 lb (86.2 kg)  03/22/21 190 lb 12.8 oz (86.5 kg)    General appearance: alert, cooperative and appears stated age Ears: normal TM's and external ear canals both ears Throat: lips, mucosa, and tongue normal; teeth and gums normal Neck: no adenopathy, no carotid bruit, supple, symmetrical, trachea midline and thyroid not enlarged, symmetric, no tenderness/mass/nodules Back: symmetric, no curvature. ROM normal. No CVA tenderness. Lungs: clear to auscultation bilaterally Heart: regular rate and rhythm, S1, S2 normal, no murmur, click, rub or gallop Abdomen: soft, non-tender; bowel sounds normal; no masses,  no  organomegaly Pulses: 2+ and symmetric Skin: Skin color, texture, turgor normal. No rashes or lesions Lymph nodes: Cervical, supraclavicular, and axillary nodes normal.  Lab Results  Component Value Date   HGBA1C 6.5 03/22/2021   HGBA1C 5.8 03/25/2019   HGBA1C 5.8 (A) 02/08/2018    Lab Results  Component Value Date   CREATININE 0.82 03/22/2021   CREATININE 0.74 03/16/2020   CREATININE 0.81 03/25/2019    Lab Results  Component Value Date   WBC 4.2 03/22/2021   HGB 13.4 03/22/2021   HCT 41.2 03/22/2021   PLT 237.0 03/22/2021   GLUCOSE 116 (H) 03/22/2021   CHOL 191 03/22/2021   TRIG 132.0 03/22/2021   HDL 71.90 03/22/2021   LDLDIRECT 123.0 02/02/2015   LDLCALC 93 03/22/2021   ALT 15 03/22/2021   AST 15 03/22/2021   NA 138 03/22/2021   K 4.6 03/22/2021   CL 101 03/22/2021   CREATININE 0.82 03/22/2021   BUN 12 03/22/2021   CO2 31 03/22/2021   TSH 3.63 03/22/2021   HGBA1C 6.5 03/22/2021    MM 3D SCREEN BREAST BILATERAL  Result Date: 05/15/2021 CLINICAL DATA:  Screening. EXAM: DIGITAL SCREENING BILATERAL MAMMOGRAM WITH TOMOSYNTHESIS AND CAD TECHNIQUE: Bilateral screening digital craniocaudal and mediolateral oblique mammograms were obtained. Bilateral screening digital breast tomosynthesis was performed. COMPARISON:  Previous exam(s). ACR Breast Density Category b: There are scattered areas of fibroglandular density. FINDINGS: There are no findings suspicious for malignancy. IMPRESSION: No mammographic evidence of malignancy. A result letter of this screening mammogram will be mailed directly to the patient. RECOMMENDATION: Screening mammogram in one year. (Code:SM-B-01Y) BI-RADS CATEGORY  1: Negative. Electronically Signed   By: Ileana Roup M.D.   On: 05/15/2021 16:51    Assessment & Plan:   Problem List Items Addressed This Visit     Carpal tunnel syndrome on both sides    Managed with repeated teroid injections,  None in 6 months,  And surgery on the left since in  April 2023       Type 2 diabetes mellitus with obesity ([place]) - Primary    In the setting of recurrent steroid injections in both wrists.  Repeat a1c is due in a few weeks.  Reviewed standards of care which will include statin therapy if diagnosis remains,  And trial of ozempic or metformin for management of diabetes.        Relevant Orders   Hemoglobin A1c   Lipid Panel w/reflex Direct LDL   Comprehensive metabolic panel   Microalbumin / creatinine urine ratio    I spent a total of   29  minutes with this patient in a face to face visit on the date of this encounter reviewing the last office visit with me in March 2023 including the subsequent labs indicating type 2 DM diagnosis,    her recent surgery on her left wrist ,  patient'ss diet and eating habits, and post visit ordering of testing and therapeutics.    Follow-up: Return in about 3 months (around 09/10/2021) for follow up diabetes.   Crecencio Mc, MD

## 2021-06-17 DIAGNOSIS — M25632 Stiffness of left wrist, not elsewhere classified: Secondary | ICD-10-CM | POA: Diagnosis not present

## 2021-06-17 DIAGNOSIS — M25642 Stiffness of left hand, not elsewhere classified: Secondary | ICD-10-CM | POA: Diagnosis not present

## 2021-06-17 DIAGNOSIS — M25631 Stiffness of right wrist, not elsewhere classified: Secondary | ICD-10-CM | POA: Diagnosis not present

## 2021-06-17 DIAGNOSIS — G5603 Carpal tunnel syndrome, bilateral upper limbs: Secondary | ICD-10-CM | POA: Diagnosis not present

## 2021-06-17 DIAGNOSIS — M180 Bilateral primary osteoarthritis of first carpometacarpal joints: Secondary | ICD-10-CM | POA: Diagnosis not present

## 2021-06-17 DIAGNOSIS — M25542 Pain in joints of left hand: Secondary | ICD-10-CM | POA: Diagnosis not present

## 2021-06-17 DIAGNOSIS — Z4889 Encounter for other specified surgical aftercare: Secondary | ICD-10-CM | POA: Diagnosis not present

## 2021-06-24 ENCOUNTER — Other Ambulatory Visit (INDEPENDENT_AMBULATORY_CARE_PROVIDER_SITE_OTHER): Payer: PPO

## 2021-06-24 DIAGNOSIS — M25542 Pain in joints of left hand: Secondary | ICD-10-CM | POA: Diagnosis not present

## 2021-06-24 DIAGNOSIS — M25632 Stiffness of left wrist, not elsewhere classified: Secondary | ICD-10-CM | POA: Diagnosis not present

## 2021-06-24 DIAGNOSIS — E669 Obesity, unspecified: Secondary | ICD-10-CM

## 2021-06-24 DIAGNOSIS — G5603 Carpal tunnel syndrome, bilateral upper limbs: Secondary | ICD-10-CM | POA: Diagnosis not present

## 2021-06-24 DIAGNOSIS — E1169 Type 2 diabetes mellitus with other specified complication: Secondary | ICD-10-CM

## 2021-06-24 DIAGNOSIS — M180 Bilateral primary osteoarthritis of first carpometacarpal joints: Secondary | ICD-10-CM | POA: Diagnosis not present

## 2021-06-24 DIAGNOSIS — M25631 Stiffness of right wrist, not elsewhere classified: Secondary | ICD-10-CM | POA: Diagnosis not present

## 2021-06-24 DIAGNOSIS — M25642 Stiffness of left hand, not elsewhere classified: Secondary | ICD-10-CM | POA: Diagnosis not present

## 2021-06-24 DIAGNOSIS — Z4889 Encounter for other specified surgical aftercare: Secondary | ICD-10-CM | POA: Diagnosis not present

## 2021-06-24 LAB — COMPREHENSIVE METABOLIC PANEL
ALT: 14 U/L (ref 0–35)
AST: 15 U/L (ref 0–37)
Albumin: 4 g/dL (ref 3.5–5.2)
Alkaline Phosphatase: 53 U/L (ref 39–117)
BUN: 13 mg/dL (ref 6–23)
CO2: 27 mEq/L (ref 19–32)
Calcium: 9.3 mg/dL (ref 8.4–10.5)
Chloride: 106 mEq/L (ref 96–112)
Creatinine, Ser: 0.78 mg/dL (ref 0.40–1.20)
GFR: 78.85 mL/min (ref >60.00)
Glucose, Bld: 125 mg/dL — ABNORMAL HIGH (ref 70–99)
Potassium: 4.2 mEq/L (ref 3.5–5.1)
Sodium: 140 mEq/L (ref 135–145)
Total Bilirubin: 0.6 mg/dL (ref 0.2–1.2)
Total Protein: 6.4 g/dL (ref 6.0–8.3)

## 2021-06-24 LAB — HEMOGLOBIN A1C: Hgb A1c MFr Bld: 6.5 % (ref 4.6–6.5)

## 2021-06-25 LAB — LIPID PANEL W/REFLEX DIRECT LDL
Cholesterol: 167 mg/dL (ref <200)
HDL: 58 mg/dL (ref >=50)
LDL Cholesterol (Calc): 88 mg/dL (calc)
Non-HDL Cholesterol (Calc): 109 mg/dL (calc) (ref <130)
Total CHOL/HDL Ratio: 2.9 (calc) (ref <5.0)
Triglycerides: 115 mg/dL (ref <150)

## 2021-06-26 ENCOUNTER — Other Ambulatory Visit: Payer: Self-pay | Admitting: Internal Medicine

## 2021-06-26 MED ORDER — METRONIDAZOLE 1 % EX GEL: Freq: Every day | CUTANEOUS | 0 refills | Status: DC

## 2021-07-15 DIAGNOSIS — G5603 Carpal tunnel syndrome, bilateral upper limbs: Secondary | ICD-10-CM | POA: Diagnosis not present

## 2021-07-15 DIAGNOSIS — M25542 Pain in joints of left hand: Secondary | ICD-10-CM | POA: Diagnosis not present

## 2021-07-15 DIAGNOSIS — M180 Bilateral primary osteoarthritis of first carpometacarpal joints: Secondary | ICD-10-CM | POA: Diagnosis not present

## 2021-07-15 DIAGNOSIS — Z4889 Encounter for other specified surgical aftercare: Secondary | ICD-10-CM | POA: Diagnosis not present

## 2021-07-15 DIAGNOSIS — M25632 Stiffness of left wrist, not elsewhere classified: Secondary | ICD-10-CM | POA: Diagnosis not present

## 2021-07-15 DIAGNOSIS — M25631 Stiffness of right wrist, not elsewhere classified: Secondary | ICD-10-CM | POA: Diagnosis not present

## 2021-07-15 DIAGNOSIS — M25642 Stiffness of left hand, not elsewhere classified: Secondary | ICD-10-CM | POA: Diagnosis not present

## 2021-07-22 DIAGNOSIS — H6123 Impacted cerumen, bilateral: Secondary | ICD-10-CM | POA: Diagnosis not present

## 2021-07-22 DIAGNOSIS — J0140 Acute pansinusitis, unspecified: Secondary | ICD-10-CM | POA: Diagnosis not present

## 2021-07-22 DIAGNOSIS — R059 Cough, unspecified: Secondary | ICD-10-CM | POA: Diagnosis not present

## 2021-08-19 ENCOUNTER — Other Ambulatory Visit: Payer: Self-pay

## 2021-08-19 DIAGNOSIS — M180 Bilateral primary osteoarthritis of first carpometacarpal joints: Secondary | ICD-10-CM | POA: Diagnosis not present

## 2021-08-19 DIAGNOSIS — G5603 Carpal tunnel syndrome, bilateral upper limbs: Secondary | ICD-10-CM | POA: Diagnosis not present

## 2021-08-19 DIAGNOSIS — M65312 Trigger thumb, left thumb: Secondary | ICD-10-CM | POA: Diagnosis not present

## 2021-08-19 MED ORDER — ESOMEPRAZOLE MAGNESIUM 40 MG PO CPDR: DELAYED_RELEASE_CAPSULE | ORAL | 1 refills | Status: DC

## 2021-09-05 DIAGNOSIS — J309 Allergic rhinitis, unspecified: Secondary | ICD-10-CM | POA: Diagnosis not present

## 2021-09-05 DIAGNOSIS — J329 Chronic sinusitis, unspecified: Secondary | ICD-10-CM | POA: Diagnosis not present

## 2021-09-09 ENCOUNTER — Other Ambulatory Visit: Payer: Self-pay | Admitting: Internal Medicine

## 2021-09-10 DIAGNOSIS — J301 Allergic rhinitis due to pollen: Secondary | ICD-10-CM | POA: Diagnosis not present

## 2021-09-16 DIAGNOSIS — G5603 Carpal tunnel syndrome, bilateral upper limbs: Secondary | ICD-10-CM | POA: Diagnosis not present

## 2021-09-16 DIAGNOSIS — M180 Bilateral primary osteoarthritis of first carpometacarpal joints: Secondary | ICD-10-CM | POA: Diagnosis not present

## 2021-09-16 DIAGNOSIS — M65312 Trigger thumb, left thumb: Secondary | ICD-10-CM | POA: Diagnosis not present

## 2021-09-17 ENCOUNTER — Ambulatory Visit (INDEPENDENT_AMBULATORY_CARE_PROVIDER_SITE_OTHER): Payer: PPO | Admitting: Internal Medicine

## 2021-09-17 ENCOUNTER — Encounter: Payer: Self-pay | Admitting: Internal Medicine

## 2021-09-17 VITALS — BP 116/62 | HR 85 | Temp 97.8°F | Ht 64.0 in | Wt 181.8 lb

## 2021-09-17 DIAGNOSIS — E785 Hyperlipidemia, unspecified: Secondary | ICD-10-CM

## 2021-09-17 DIAGNOSIS — E1169 Type 2 diabetes mellitus with other specified complication: Secondary | ICD-10-CM | POA: Diagnosis not present

## 2021-09-17 DIAGNOSIS — E669 Obesity, unspecified: Secondary | ICD-10-CM

## 2021-09-17 DIAGNOSIS — L658 Other specified nonscarring hair loss: Secondary | ICD-10-CM | POA: Diagnosis not present

## 2021-09-17 NOTE — Progress Notes (Unsigned)
Subjective:  Patient ID: Kathleen Russell, female    DOB: October 16, 1955  Age: 66 y.o. MRN: [id number]  CC: The primary encounter diagnosis was Type 2 diabetes mellitus with obesity ([place]). Diagnoses of Hyperlipidemia LDL goal <160 and Female pattern hair loss were also pertinent to this visit.   HPI Kathleen Russell presents for  Chief Complaint  Patient presents with   Follow-up    3 month follow up on diabetes    T2DM: newly diagnosed with a1c of 6.5   has been checking BS at home and notes elevated readings but has been  taking  a prolonged course of  prednisone day  12/12  for recurrent chronic sinusitis prescribed by dr Tami Ribas ENT . Has had a reading of 246 recently .  Prior to current prednisone prescription, home readings have been < 130 fasting .  She is exercising regularly and has lost 9 lbs since March   Received a steroid injection in both thumbs August 14.  Has had multiple steroid injections during the past year.  Watching diet      Outpatient Medications Prior to Visit  Medication Sig Dispense Refill   acetaminophen (TYLENOL) 500 MG tablet Take by mouth.     blood glucose meter kit and supplies Dispense based on patient and insurance preference. Use up to four times daily as directed. (FOR ICD-10 E10.9, E11.9). 1 each 0   cetirizine (ZYRTEC) 10 MG tablet Take 10 mg by mouth daily.     esomeprazole (NEXIUM) 40 MG capsule TAKE 1 CAPSULE BY MOUTH DAILY BEFORE BREAKFAST 90 capsule 1   estradiol (ESTRACE) 0.1 MG/GM vaginal cream INSERT 1 APPLICATORFUL VAGINALLY 3 TIMES A WEEK 42.5 g 12   fluticasone (FLONASE) 50 MCG/ACT nasal spray SHAKE LIQUID AND USE 2 SPRAYS IN EACH NOSTRIL AT BEDTIME AS NEEDED 16 g 5   metroNIDAZOLE (METROGEL) 1 % gel Apply topically daily. 45 g 0   minoxidil (MINOXIDIL FOR WOMEN) 2 % external solution Apply topically 2 (two) times daily. 60 mL 0   Multiple Vitamin (MULTIVITAMIN) tablet Take 1 tablet by mouth daily.     predniSONE (STERAPRED UNI-PAK 48 TAB)  10 MG (48) TBPK tablet Take by mouth as directed.     valACYclovir (VALTREX) 1000 MG tablet Take 1 tablet (1,000 mg total) by mouth 3 (three) times daily. KEEP ON FILE FOR FUTURE REFILLS 21 tablet 1   VENTOLIN HFA 108 (90 Base) MCG/ACT inhaler Inhale 2 puffs into the lungs every 6 (six) hours as needed. 1 each 1   No facility-administered medications prior to visit.    Review of Systems;  Patient denies headache, fevers, malaise, unintentional weight loss, skin rash, eye pain, sinus congestion and sinus pain, sore throat, dysphagia,  hemoptysis , cough, dyspnea, wheezing, chest pain, palpitations, orthopnea, edema, abdominal pain, nausea, melena, diarrhea, constipation, flank pain, dysuria, hematuria, urinary  Frequency, nocturia, numbness, tingling, seizures,  Focal weakness, Loss of consciousness,  Tremor, insomnia, depression, anxiety, and suicidal ideation.      Objective:  BP 116/62 (BP Location: Left Arm, Patient Position: Sitting, Cuff Size: Large)   Pulse 85   Temp 97.8 F (36.6 C) (Oral)   Ht 5' 4"  (1.626 m)   Wt 181 lb 12.8 oz (82.5 kg)   SpO2 97%   BMI 31.21 kg/m   BP Readings from Last 3 Encounters:  09/17/21 116/62  06/10/21 132/84  03/22/21 128/80    Wt Readings from Last 3 Encounters:  09/17/21 181 lb 12.8 oz (  82.5 kg)  06/10/21 186 lb 6.4 oz (84.6 kg)  04/23/21 190 lb (86.2 kg)    General appearance: alert, cooperative and appears stated age Ears: normal TM's and external ear canals both ears Throat: lips, mucosa, and tongue normal; teeth and gums normal Neck: no adenopathy, no carotid bruit, supple, symmetrical, trachea midline and thyroid not enlarged, symmetric, no tenderness/mass/nodules Back: symmetric, no curvature. ROM normal. No CVA tenderness. Lungs: clear to auscultation bilaterally Heart: regular rate and rhythm, S1, S2 normal, no murmur, click, rub or gallop Abdomen: soft, non-tender; bowel sounds normal; no masses,  no organomegaly Pulses: 2+  and symmetric Skin: Skin color, texture, turgor normal. No rashes or lesions Lymph nodes: Cervical, supraclavicular, and axillary nodes normal. Neuro:  awake and interactive with normal mood and affect. Higher cortical functions are normal. Speech is clear without word-finding difficulty or dysarthria. Extraocular movements are intact. Visual fields of both eyes are grossly intact. Sensation to light touch is grossly intact bilaterally of upper and lower extremities. Motor examination shows 4+/5 symmetric hand grip and upper extremity and 5/5 lower extremity strength. There is no pronation or drift. Gait is non-ataxic   Lab Results  Component Value Date   HGBA1C 6.5 06/24/2021   HGBA1C 6.5 03/22/2021   HGBA1C 5.8 03/25/2019    Lab Results  Component Value Date   CREATININE 0.78 06/24/2021   CREATININE 0.82 03/22/2021   CREATININE 0.74 03/16/2020    Lab Results  Component Value Date   WBC 4.2 03/22/2021   HGB 13.4 03/22/2021   HCT 41.2 03/22/2021   PLT 237.0 03/22/2021   GLUCOSE 125 (H) 06/24/2021   CHOL 167 06/24/2021   TRIG 115 06/24/2021   HDL 58 06/24/2021   LDLDIRECT 123.0 02/02/2015   LDLCALC 88 06/24/2021   ALT 14 06/24/2021   AST 15 06/24/2021   NA 140 06/24/2021   K 4.2 06/24/2021   CL 106 06/24/2021   CREATININE 0.78 06/24/2021   BUN 13 06/24/2021   CO2 27 06/24/2021   TSH 3.63 03/22/2021   HGBA1C 6.5 06/24/2021   MICROALBUR <0.7 06/10/2021    MM 3D SCREEN BREAST BILATERAL  Result Date: 05/15/2021 CLINICAL DATA:  Screening. EXAM: DIGITAL SCREENING BILATERAL MAMMOGRAM WITH TOMOSYNTHESIS AND CAD TECHNIQUE: Bilateral screening digital craniocaudal and mediolateral oblique mammograms were obtained. Bilateral screening digital breast tomosynthesis was performed. COMPARISON:  Previous exam(s). ACR Breast Density Category b: There are scattered areas of fibroglandular density. FINDINGS: There are no findings suspicious for malignancy. IMPRESSION: No mammographic  evidence of malignancy. A result letter of this screening mammogram will be mailed directly to the patient. RECOMMENDATION: Screening mammogram in one year. (Code:SM-B-01Y) BI-RADS CATEGORY  1: Negative. Electronically Signed   By: Ileana Roup M.D.   On: 05/15/2021 16:51    Assessment & Plan:   Problem List Items Addressed This Visit     Female pattern hair loss    Improving with hair vitamins . Did not start minoxidil      Hyperlipidemia LDL goal <160   Relevant Orders   Lipid panel   LDL cholesterol, direct   Type 2 diabetes mellitus with obesity ([place]) - Primary    Newly diagnosed,  May have been triggered by recurrent steroid injections for multiple orthopedic issues .Marland Kitchen  No medications  indicated  basd on most recent a1c.  She is losing weight,  Exercising regularly and restricting her sugars.  She has deferred statin therapy today despite discussion ,  Foot exam is normal  today  Relevant Orders   Hemoglobin A1c   Comprehensive metabolic panel    I spent a total of  32 minutes with this patient in a face to face visit on the date of this encounter reviewing the last office visit with me June , atient's diet and exercise habits, blood sugar readings, , counselling on the standsads of diabetes care and the risk of atherosclerosis   and post visit ordering of testing and therapeutics.    Follow-up: No follow-ups on file.   Crecencio Mc, MD

## 2021-09-17 NOTE — Assessment & Plan Note (Addendum)
Improving with hair vitamins . Did not start minoxidil

## 2021-09-18 NOTE — Assessment & Plan Note (Signed)
Newly diagnosed,  May have been triggered by recurrent steroid injections for multiple orthopedic issues .Marland Kitchen  No medications  indicated  basd on most recent a1c.  She is losing weight,  Exercising regularly and restricting her sugars.  She has deferred statin therapy today despite discussion ,  Foot exam is normal  today

## 2021-09-30 DIAGNOSIS — J329 Chronic sinusitis, unspecified: Secondary | ICD-10-CM | POA: Diagnosis not present

## 2021-10-15 ENCOUNTER — Other Ambulatory Visit (INDEPENDENT_AMBULATORY_CARE_PROVIDER_SITE_OTHER): Payer: PPO

## 2021-10-15 DIAGNOSIS — E785 Hyperlipidemia, unspecified: Secondary | ICD-10-CM

## 2021-10-15 DIAGNOSIS — E669 Obesity, unspecified: Secondary | ICD-10-CM

## 2021-10-15 DIAGNOSIS — E1169 Type 2 diabetes mellitus with other specified complication: Secondary | ICD-10-CM | POA: Diagnosis not present

## 2021-10-15 LAB — LIPID PANEL
Cholesterol: 174 mg/dL (ref 0–200)
HDL: 63.2 mg/dL (ref >39.00)
LDL Cholesterol: 90 mg/dL (ref 0–99)
NonHDL: 110.41
Total CHOL/HDL Ratio: 3
Triglycerides: 101 mg/dL (ref 0.0–149.0)
VLDL: 20.2 mg/dL (ref 0.0–40.0)

## 2021-10-15 LAB — COMPREHENSIVE METABOLIC PANEL
ALT: 14 U/L (ref 0–35)
AST: 15 U/L (ref 0–37)
Albumin: 3.9 g/dL (ref 3.5–5.2)
Alkaline Phosphatase: 51 U/L (ref 39–117)
BUN: 16 mg/dL (ref 6–23)
CO2: 25 mEq/L (ref 19–32)
Calcium: 9.2 mg/dL (ref 8.4–10.5)
Chloride: 103 mEq/L (ref 96–112)
Creatinine, Ser: 0.87 mg/dL (ref 0.40–1.20)
GFR: 69.02 mL/min (ref >60.00)
Glucose, Bld: 111 mg/dL — ABNORMAL HIGH (ref 70–99)
Potassium: 4.1 mEq/L (ref 3.5–5.1)
Sodium: 138 mEq/L (ref 135–145)
Total Bilirubin: 0.5 mg/dL (ref 0.2–1.2)
Total Protein: 6.3 g/dL (ref 6.0–8.3)

## 2021-10-15 LAB — HEMOGLOBIN A1C: Hgb A1c MFr Bld: 7 % — ABNORMAL HIGH (ref 4.6–6.5)

## 2021-10-15 LAB — LDL CHOLESTEROL, DIRECT: Direct LDL: 101 mg/dL

## 2021-11-03 IMAGING — MG MM DIGITAL SCREENING BILAT W/ TOMO AND CAD
8 series · 8 of 24 positions shown · non-contrast
Comparison: Previous exam(s).

CLINICAL DATA: Screening.

EXAM:
DIGITAL SCREENING BILATERAL MAMMOGRAM WITH TOMOSYNTHESIS AND CAD
TECHNIQUE: Bilateral screening digital craniocaudal and mediolateral oblique
mammograms were obtained. Bilateral screening digital breast
tomosynthesis was performed. The images were evaluated with
computer-aided detection.

[R CC synth-2D]
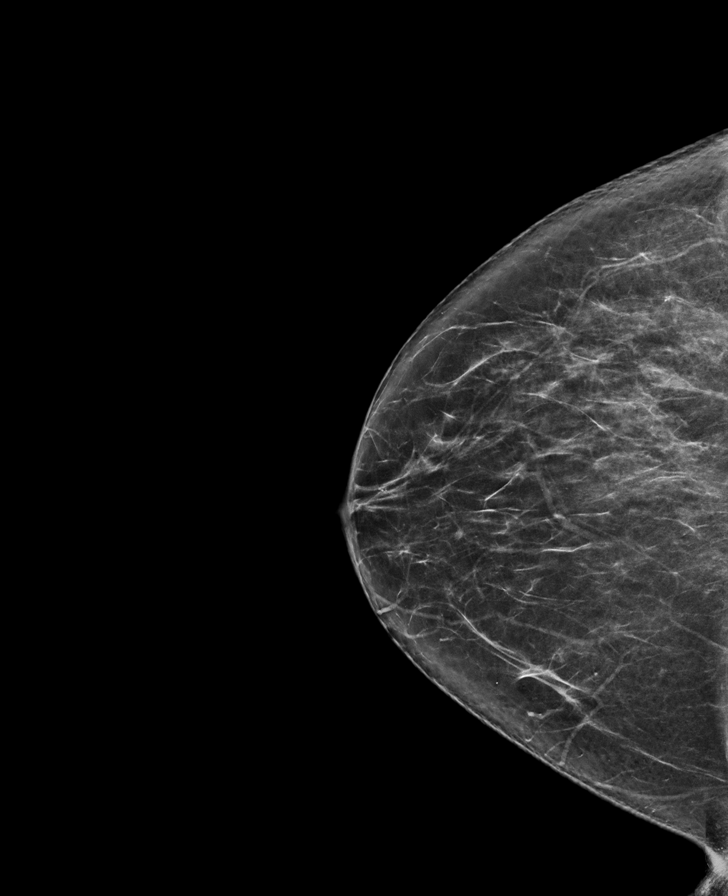

[R MLO synth-2D]
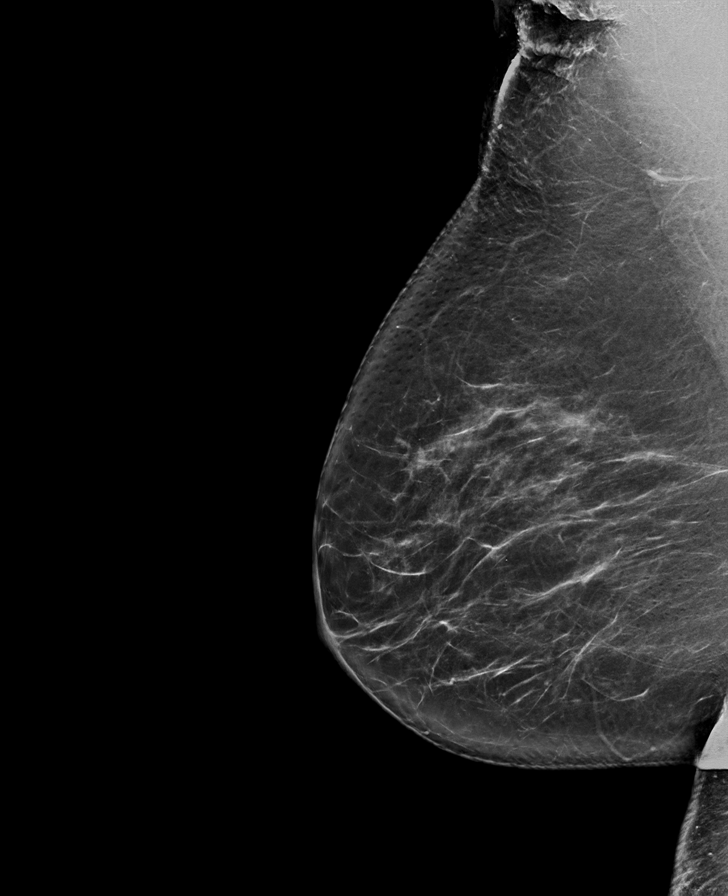

[L CC synth-2D]
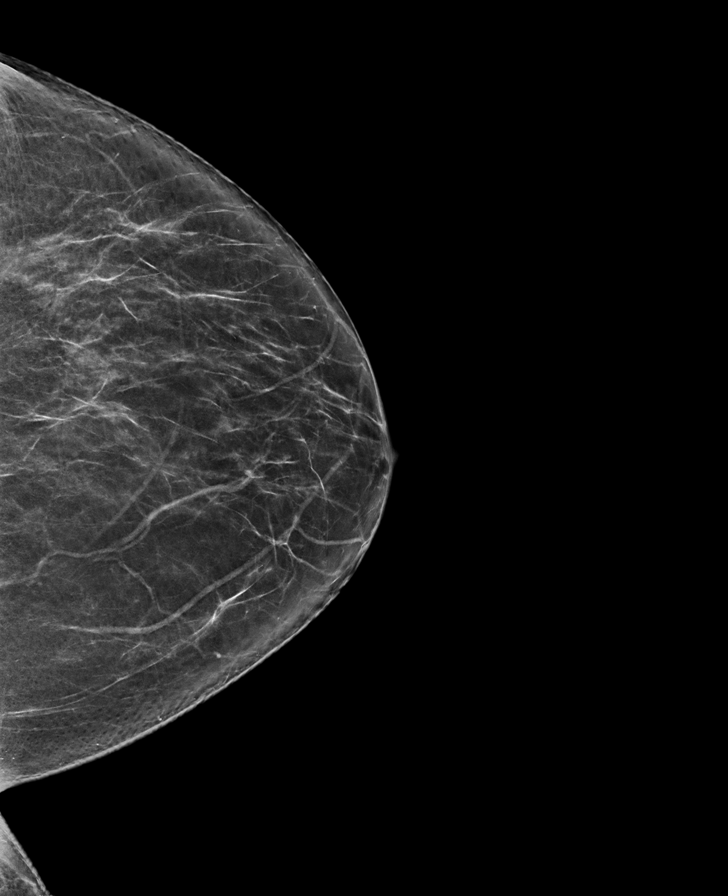

[L MLO synth-2D]
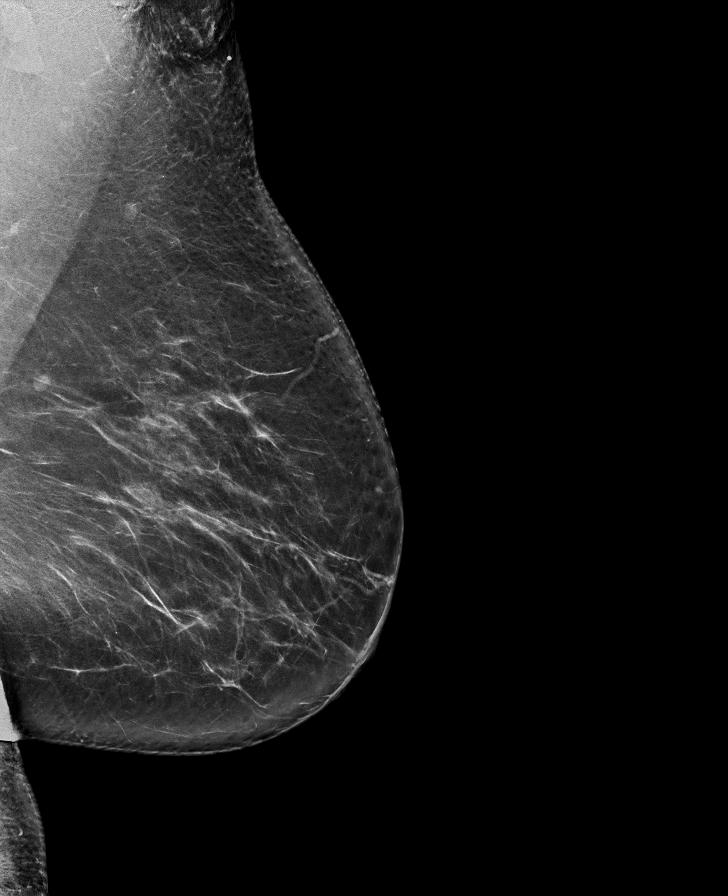

[R MLO tomo · tomo slice 45/89.0]
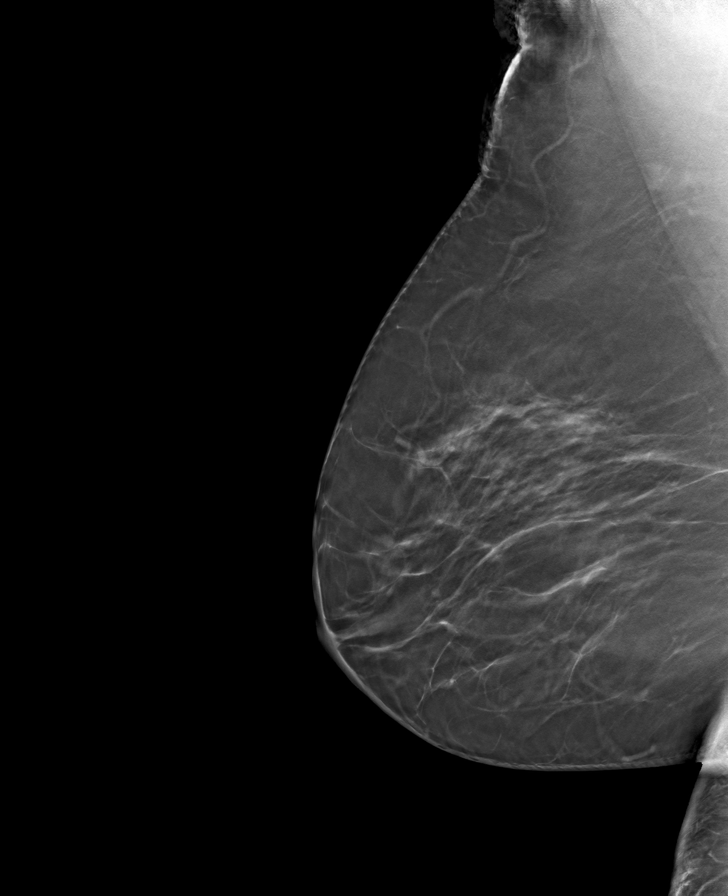

[L CC tomo · tomo slice 37/74.0]
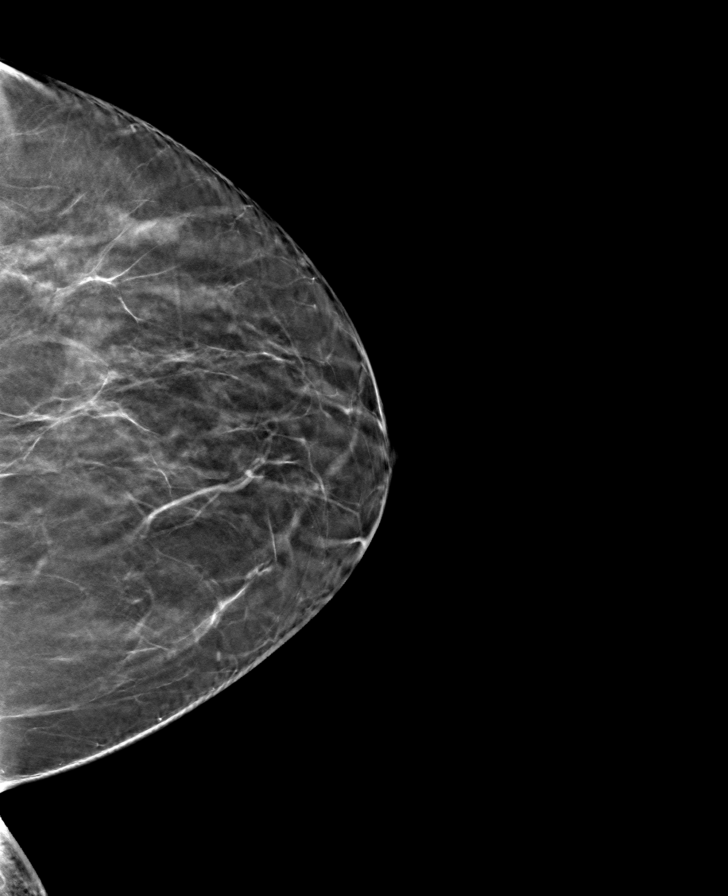

[R CC tomo · tomo slice 39/77.0]
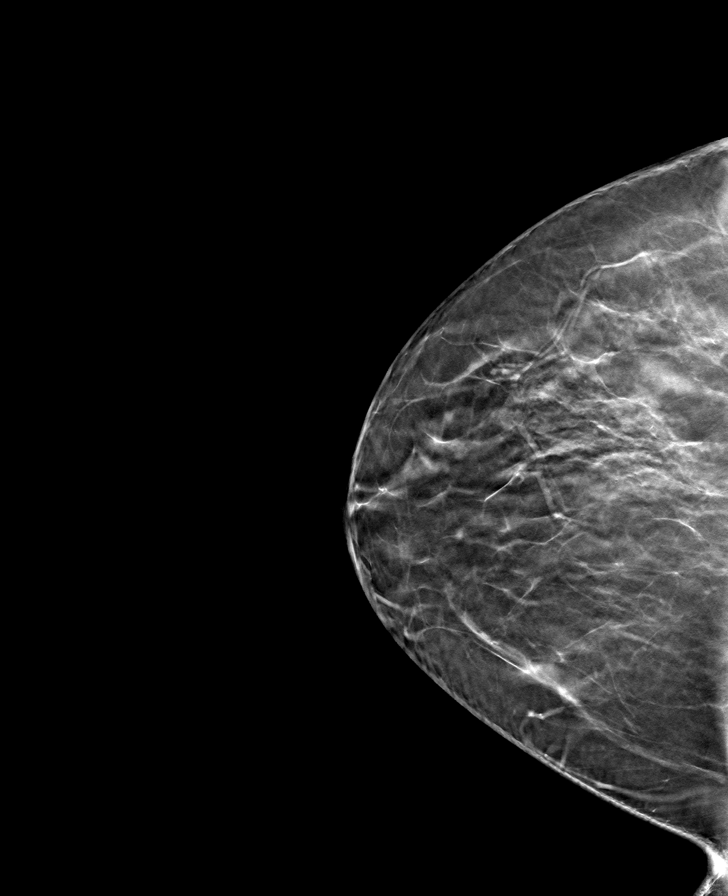

[L MLO tomo · tomo slice 45/89.0]
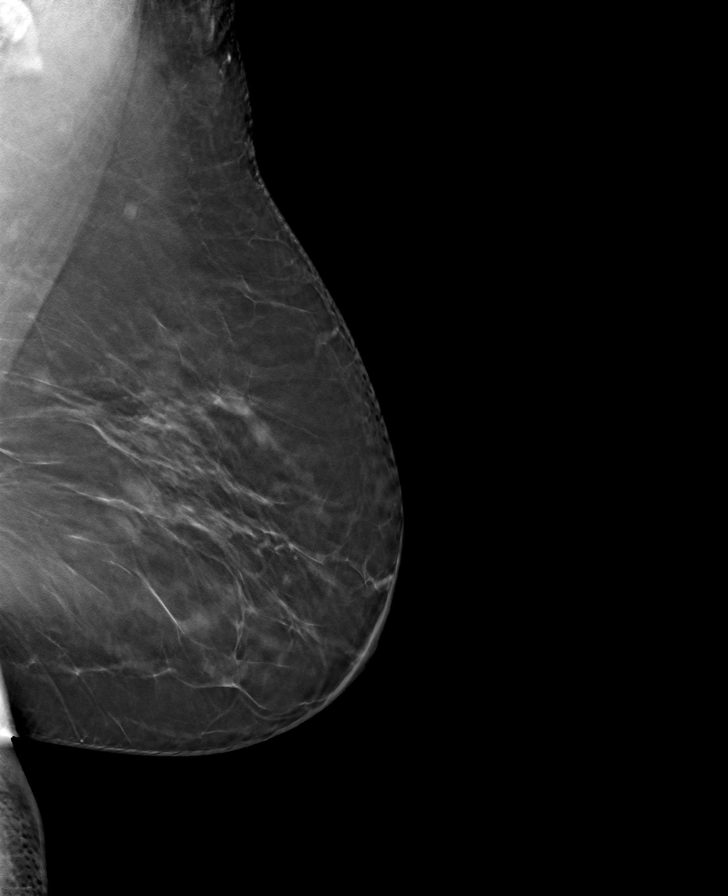

[8 of 24 positions shown; findings below may reference images not displayed]

ACR Breast Density Category b: There are scattered areas of
fibroglandular density.
FINDINGS: There are no findings suspicious for malignancy. The images were
evaluated with computer-aided detection.
IMPRESSION: No mammographic evidence of malignancy. A result letter of this
screening mammogram will be mailed directly to the patient.

RECOMMENDATION:
Screening mammogram in one year. (Code:WJ-[id number])

BI-RADS CATEGORY  1: Negative.

## 2021-11-19 ENCOUNTER — Encounter: Payer: Self-pay | Admitting: Internal Medicine

## 2021-12-04 ENCOUNTER — Encounter: Payer: Self-pay | Admitting: Family

## 2021-12-04 ENCOUNTER — Ambulatory Visit (INDEPENDENT_AMBULATORY_CARE_PROVIDER_SITE_OTHER): Payer: PPO | Admitting: Family

## 2021-12-04 VITALS — BP 126/76 | HR 102 | Temp 99.0°F | Ht 64.0 in | Wt 177.8 lb

## 2021-12-04 DIAGNOSIS — R051 Acute cough: Secondary | ICD-10-CM

## 2021-12-04 DIAGNOSIS — J40 Bronchitis, not specified as acute or chronic: Secondary | ICD-10-CM | POA: Diagnosis not present

## 2021-12-04 LAB — POCT INFLUENZA A/B
Influenza A, POC: NEGATIVE
Influenza B, POC: NEGATIVE

## 2021-12-04 LAB — POC COVID19 BINAXNOW: SARS Coronavirus 2 Ag: NEGATIVE

## 2021-12-04 MED ORDER — AZITHROMYCIN 250 MG PO TABS: ORAL_TABLET | ORAL | 0 refills | Status: AC

## 2021-12-04 MED ORDER — VENTOLIN HFA 108 (90 BASE) MCG/ACT IN AERS: 2.0000 | INHALATION_SPRAY | Freq: Four times a day (QID) | RESPIRATORY_TRACT | 1 refills | Status: DC | PRN

## 2021-12-04 MED ORDER — GUAIFENESIN-CODEINE 100-10 MG/5ML PO SYRP: 5.0000 mL | ORAL_SOLUTION | Freq: Every evening | ORAL | 0 refills | Status: DC | PRN
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
## 2021-12-04 NOTE — Assessment & Plan Note (Addendum)
No acute respiratory distress.  Negative flu and COVID point-of-care . reassuring exam.  Duration of 4 weeks.  Patient politely declines chest x-ray at this time.  Start azithromycin.  She prefers codeine-based syrup.   provided her with Cheratussin cough syrup and refilled Ventolin inhaler.  Advised in regards to sedation with cheratussin and no alcohol use.  If no improvement in symptoms, patient understands let me know right away and we will obtain chest xray.  I looked up patient on [place] Controlled Substances Reporting System PMP AWARE and saw no activity that raised concern of inappropriate use.

## 2021-12-04 NOTE — Patient Instructions (Signed)
I have sent azithromycin, antibiotic, for you to start.  Ensure to take probiotics while on antibiotics and also for 2 weeks after completion. This can either be by eating yogurt daily or taking a probiotic supplement over the counter such as Culturelle.It is important to re-colonize the gut with good bacteria and also to prevent any diarrheal infections associated with antibiotic use.   I have refilled dental inhaler.  I encourage you to use this more often including every 6 hours which can help decrease cough.  I given you Cheratussin cough syrup as well.   Please take cough medication at night only as needed. As we discussed, I do not recommend dosing throughout the day as coughing is a protective mechanism . It also helps to break up thick mucous.  Do not take cough suppressants with alcohol as can lead to trouble breathing. Advise caution if taking cough suppressant and operating machinery ( i.e driving a car) as you may feel very tired.    Please let me know how you are doing and certainly if cough persists, I would recommend chest x-ray

## 2021-12-16 DIAGNOSIS — D3132 Benign neoplasm of left choroid: Secondary | ICD-10-CM | POA: Diagnosis not present

## 2021-12-16 LAB — HM DIABETES EYE EXAM

## 2022-01-13 DIAGNOSIS — M180 Bilateral primary osteoarthritis of first carpometacarpal joints: Secondary | ICD-10-CM | POA: Diagnosis not present

## 2022-01-24 DIAGNOSIS — D2272 Melanocytic nevi of left lower limb, including hip: Secondary | ICD-10-CM | POA: Diagnosis not present

## 2022-01-24 DIAGNOSIS — D2271 Melanocytic nevi of right lower limb, including hip: Secondary | ICD-10-CM | POA: Diagnosis not present

## 2022-01-24 DIAGNOSIS — L570 Actinic keratosis: Secondary | ICD-10-CM | POA: Diagnosis not present

## 2022-01-24 DIAGNOSIS — L308 Other specified dermatitis: Secondary | ICD-10-CM | POA: Diagnosis not present

## 2022-01-24 DIAGNOSIS — D2261 Melanocytic nevi of right upper limb, including shoulder: Secondary | ICD-10-CM | POA: Diagnosis not present

## 2022-01-24 DIAGNOSIS — L814 Other melanin hyperpigmentation: Secondary | ICD-10-CM | POA: Diagnosis not present

## 2022-01-24 DIAGNOSIS — D2262 Melanocytic nevi of left upper limb, including shoulder: Secondary | ICD-10-CM | POA: Diagnosis not present

## 2022-01-24 DIAGNOSIS — L821 Other seborrheic keratosis: Secondary | ICD-10-CM | POA: Diagnosis not present

## 2022-01-24 DIAGNOSIS — D485 Neoplasm of uncertain behavior of skin: Secondary | ICD-10-CM | POA: Diagnosis not present

## 2022-01-24 DIAGNOSIS — D0471 Carcinoma in situ of skin of right lower limb, including hip: Secondary | ICD-10-CM | POA: Diagnosis not present

## 2022-01-24 DIAGNOSIS — D225 Melanocytic nevi of trunk: Secondary | ICD-10-CM | POA: Diagnosis not present

## 2022-02-03 DIAGNOSIS — J329 Chronic sinusitis, unspecified: Secondary | ICD-10-CM | POA: Diagnosis not present

## 2022-02-14 ENCOUNTER — Telehealth: Payer: Self-pay

## 2022-02-14 MED ORDER — ESTRADIOL 0.1 MG/GM VA CREA: TOPICAL_CREAM | VAGINAL | 12 refills | Status: DC

## 2022-02-14 NOTE — Telephone Encounter (Signed)
Refilled: 05/24/2020 Last OV: 09/17/2021 Next OV: 04/01/2022

## 2022-02-14 NOTE — Telephone Encounter (Signed)
estradiol

## 2022-02-14 NOTE — Addendum Note (Signed)
Addended by: Crecencio Mc on: 02/14/2022 04:04 PM   Modules accepted: Orders

## 2022-03-05 ENCOUNTER — Encounter: Payer: Self-pay | Admitting: Internal Medicine

## 2022-03-11 ENCOUNTER — Other Ambulatory Visit: Payer: Self-pay | Admitting: Internal Medicine

## 2022-03-19 DIAGNOSIS — D0471 Carcinoma in situ of skin of right lower limb, including hip: Secondary | ICD-10-CM | POA: Diagnosis not present

## 2022-03-28 ENCOUNTER — Encounter: Payer: PPO | Admitting: Internal Medicine

## 2022-04-01 ENCOUNTER — Encounter: Payer: Self-pay | Admitting: Internal Medicine

## 2022-04-01 ENCOUNTER — Encounter: Payer: PPO | Admitting: Internal Medicine

## 2022-04-02 NOTE — Telephone Encounter (Signed)
Chart has been updated.

## 2022-04-03 ENCOUNTER — Other Ambulatory Visit: Payer: Self-pay | Admitting: Internal Medicine

## 2022-04-03 DIAGNOSIS — Z1231 Encounter for screening mammogram for malignant neoplasm of breast: Secondary | ICD-10-CM

## 2022-04-11 ENCOUNTER — Ambulatory Visit (INDEPENDENT_AMBULATORY_CARE_PROVIDER_SITE_OTHER): Payer: PPO | Admitting: Internal Medicine

## 2022-04-11 ENCOUNTER — Encounter: Payer: Self-pay | Admitting: Internal Medicine

## 2022-04-11 VITALS — BP 110/60 | HR 77 | Temp 98.0°F | Ht 64.0 in | Wt 181.4 lb

## 2022-04-11 DIAGNOSIS — L03113 Cellulitis of right upper limb: Secondary | ICD-10-CM

## 2022-04-11 DIAGNOSIS — Z0000 Encounter for general adult medical examination without abnormal findings: Secondary | ICD-10-CM

## 2022-04-11 DIAGNOSIS — E785 Hyperlipidemia, unspecified: Secondary | ICD-10-CM | POA: Diagnosis not present

## 2022-04-11 DIAGNOSIS — Z0001 Encounter for general adult medical examination with abnormal findings: Secondary | ICD-10-CM

## 2022-04-11 DIAGNOSIS — E1169 Type 2 diabetes mellitus with other specified complication: Secondary | ICD-10-CM

## 2022-04-11 DIAGNOSIS — E669 Obesity, unspecified: Secondary | ICD-10-CM | POA: Diagnosis not present

## 2022-04-11 MED ORDER — SCOPOLAMINE 1 MG/3DAYS TD PT72: 1.0000 | MEDICATED_PATCH | TRANSDERMAL | 0 refills | Status: DC

## 2022-04-11 MED ORDER — CEPHALEXIN 500 MG PO CAPS: 500.0000 mg | ORAL_CAPSULE | Freq: Four times a day (QID) | ORAL | 9 refills | Status: DC

## 2022-04-11 NOTE — Assessment & Plan Note (Addendum)
Encouraged to resume exercising regularly  She has deferred statin therapy today despite discussion ,  Foot exam is normal  today.  Labs due   Lab Results  Component Value Date   HGBA1C 7.0 (H) 10/15/2021

## 2022-04-11 NOTE — Patient Instructions (Signed)
Stop picking at your ear!  Use the antimicrobial ointment twice daily for 3 days  Keflex 500 mg 4 times daily for one week for the infected scrape on arm  KEEP YOUR LEG WOUND covered!    Now go finish your homework or you can't watch TV tonight .  Kathleen Cos!)  The scopalamine patches can be worn for 72 hours before changing   Fasting sugars should be  125 and post prandial < 160

## 2022-04-11 NOTE — Assessment & Plan Note (Signed)

## 2022-04-11 NOTE — Assessment & Plan Note (Signed)
statin therapy  recommended in spite of normal lipid levels as standard of care rec for patients with type DM.   She has deferred

## 2022-04-11 NOTE — Assessment & Plan Note (Signed)
Superficial scrape from kitchen pan appears infected.  Keflex prescribed

## 2022-04-11 NOTE — Progress Notes (Signed)
4

## 2022-04-11 NOTE — Progress Notes (Signed)
Patient ID: Kathleen Russell, female    DOB: 04/18/1955  Age: 67 y.o. MRN: [id number]  The patient is here for annual  preventive  examination and management of other chronic and acute problems.   The risk factors are reflected in the social history.  The roster of all physicians providing medical care to patient - is listed in the Snapshot section of the chart.  Activities of daily living:  The patient is 100% independent in all ADLs: dressing, toileting, feeding as well as independent mobility  Home safety : The patient has smoke detectors in the home. They wear seatbelts.  There are no firearms at home. There is no violence in the home.   There is no risks for hepatitis, STDs or HIV. There is no   history of blood transfusion. They have no travel history to infectious disease endemic areas of the world.  The patient has seen their dentist in the last six month. They have seen their eye doctor in the last year. They admit to slight hearing difficulty with regard to whispered voices and some television programs.  They have deferred audiologic testing in the last year.  They do not  have excessive sun exposure. Discussed the need for sun protection: hats, long sleeves and use of sunscreen if there is significant sun exposure.   Diet: the importance of a healthy diet is discussed. They do have a healthy diet.  The benefits of regular aerobic exercise were discussed. She walks 4 times per week ,  20 minutes.   Depression screen: there are no signs or vegative symptoms of depression- irritability, change in appetite, anhedonia, sadness/tearfullness.  Cognitive assessment: the patient manages all their financial and personal affairs and is actively engaged. They could relate day,date,year and events; recalled 2/3 objects at 3 minutes; performed clock-face test normally.  The following portions of the patient's history were reviewed and updated as appropriate: allergies, current medications, past  family history, past medical history,  past surgical history, past social history  and problem list.  Visual acuity was not assessed per patient preference since she has regular follow up with her ophthalmologist. Hearing and body mass index were assessed and reviewed.   During the course of the visit the patient was educated and counseled about appropriate screening and preventive services including : fall prevention , diabetes screening, nutrition counseling, colorectal cancer screening, and recommended immunizations.    CC: The primary encounter diagnosis was Hyperlipidemia LDL goal <160. Diagnoses of Type 2 diabetes mellitus with other specified complication, without long-term current use of insulin, Type 2 diabetes mellitus with obesity, Preventative health care, Cellulitis of arm, right, and Encounter for general adult medical examination with abnormal findings were also pertinent to this visit.   1) she has a 613 week old wound on leg. A SCC was  biopsied and excised  3 weeks  ago from right lower medial calf .  Dx:  wound is not covered  today  ([place])  2) infected wound on right forearm from a steel kitchen pan  one week ago .  Redness and oozing noted  3) right ear helix has an area of irritation  4) Type 2 DM:  has been  exercising less,  gained a few lbs.  Some dietary indiscretions since the holidays fasting sugars >100 but <130.  Post prandials 160.  Diet controlled  eye exam scheduled.     History Aram BeechamCynthia has a past medical history of allergic rhinitis, Broken ankle, Chicken pox, COVID-19 (  01/14/2020), GERD (gastroesophageal reflux disease), History of broken nose, UTI (urinary tract infection), Kidney infection, Menopause, premature, Raynaud's phenomenon, Rhinitis, nonallergic, and Type 2 diabetes mellitus with obesity (06/10/2021).   She has a past surgical history that includes Tonsillectomy and adenoidectomy; Abdominal hysterectomy; Eye surgery; Cesarean section; Total  abdominal hysterectomy w/ bilateral salpingoophorectomy (2000); ORIF ankle fracture (1980); Colonoscopy with propofol (N/A, 04/09/2016); and Cholecystectomy (N/A, 06/26/2018).   Her family history includes Alcohol abuse in her maternal uncle; Arthritis in her mother; Breast cancer in her maternal aunt; Cancer in her paternal uncle; Diabetes in her father, mother, paternal aunt, paternal uncle, and sister; Heart disease in her father and paternal uncle; Hyperlipidemia in her father; Hypertension in her father and paternal uncle.She reports that she has never smoked. She has never used smokeless tobacco. She reports current alcohol use. She reports that she does not use drugs.  Outpatient Medications Prior to Visit  Medication Sig Dispense Refill   acetaminophen (TYLENOL) 500 MG tablet Take by mouth.     blood glucose meter kit and supplies Dispense based on patient and insurance preference. Use up to four times daily as directed. (FOR ICD-10 E10.9, E11.9). 1 each 0   cetirizine (ZYRTEC) 10 MG tablet Take 10 mg by mouth daily.     esomeprazole (NEXIUM) 40 MG capsule TAKE 1 CAPSULE BY MOUTH DAILY BEFORE BREAKFAST 90 capsule 1   estradiol (ESTRACE) 0.1 MG/GM vaginal cream INSERT 1 APPLICATORFUL VAGINALLY 3 TIMES A WEEK 42.5 g 12   fluticasone (FLONASE) 50 MCG/ACT nasal spray SHAKE LIQUID AND USE 2 SPRAYS IN EACH NOSTRIL AT BEDTIME AS NEEDED 16 g 5   guaiFENesin-codeine (ROBITUSSIN AC) 100-10 MG/5ML syrup Take 5 mLs by mouth at bedtime as needed for cough. 70 mL 0   metroNIDAZOLE (METROGEL) 1 % gel Apply topically daily. 45 g 0   minoxidil (MINOXIDIL FOR WOMEN) 2 % external solution Apply topically 2 (two) times daily. 60 mL 0   Multiple Vitamin (MULTIVITAMIN) tablet Take 1 tablet by mouth daily.     valACYclovir (VALTREX) 1000 MG tablet Take 1 tablet (1,000 mg total) by mouth 3 (three) times daily. KEEP ON FILE FOR FUTURE REFILLS 21 tablet 1   VENTOLIN HFA 108 (90 Base) MCG/ACT inhaler Inhale 2 puffs into  the lungs every 6 (six) hours as needed. 1 each 1   No facility-administered medications prior to visit.    Review of Systems  Patient denies headache, fevers, malaise, unintentional weight loss,, eye pain, sinus congestion and sinus pain, sore throat, dysphagia,  hemoptysis , cough, dyspnea, wheezing, chest pain, palpitations, orthopnea, edema, abdominal pain, nausea, melena, diarrhea, constipation, flank pain, dysuria, hematuria, urinary  Frequency, nocturia, numbness, tingling, seizures,  Focal weakness, Loss of consciousness,  Tremor, insomnia, depression, anxiety, and suicidal ideation.     Objective:  BP 110/60   Pulse 77   Temp 98 F (36.7 C) (Oral)   Ht 5\' 4"  (1.626 m)   Wt 181 lb 6.4 oz (82.3 kg)   SpO2 96%   BMI 31.14 kg/m   Physical Exam Vitals reviewed.  Constitutional:      General: She is not in acute distress.    Appearance: Normal appearance. She is well-developed and normal weight. She is not ill-appearing, toxic-appearing or diaphoretic.  HENT:     Head: Normocephalic.     Right Ear: Tympanic membrane, ear canal and external ear normal. There is no impacted cerumen.     Left Ear: Tympanic membrane, ear canal and external ear  normal. There is no impacted cerumen.     Nose: Nose normal.     Mouth/Throat:     Mouth: Mucous membranes are moist.     Pharynx: Oropharynx is clear.  Eyes:     General: No scleral icterus.       Right eye: No discharge.        Left eye: No discharge.     Conjunctiva/sclera: Conjunctivae normal.     Pupils: Pupils are equal, round, and reactive to light.  Neck:     Thyroid: No thyromegaly.     Vascular: No carotid bruit or JVD.  Cardiovascular:     Rate and Rhythm: Normal rate and regular rhythm.     Heart sounds: Normal heart sounds.  Pulmonary:     Effort: Pulmonary effort is normal. No respiratory distress.     Breath sounds: Normal breath sounds.  Chest:  Breasts:    Breasts are symmetrical.     Right: Normal. No  swelling, inverted nipple, mass, nipple discharge, skin change or tenderness.     Left: Normal. No swelling, inverted nipple, mass, nipple discharge, skin change or tenderness.  Abdominal:     General: Bowel sounds are normal.     Palpations: Abdomen is soft. There is no mass.     Tenderness: There is no abdominal tenderness. There is no guarding or rebound.  Musculoskeletal:        General: Normal range of motion.     Cervical back: Normal range of motion and neck supple.  Lymphadenopathy:     Cervical: No cervical adenopathy.     Upper Body:     Right upper body: No supraclavicular, axillary or pectoral adenopathy.     Left upper body: No supraclavicular, axillary or pectoral adenopathy.  Skin:    General: Skin is warm and dry.     Findings: Laceration and wound present.          Comments: Right forearm scrape with 1 mm of erythema  Right calf excision with dried eschar  Neurological:     General: No focal deficit present.     Mental Status: She is alert and oriented to person, place, and time. Mental status is at baseline.  Psychiatric:        Mood and Affect: Mood normal.        Behavior: Behavior normal.        Thought Content: Thought content normal.        Judgment: Judgment normal.    Assessment & Plan:  Hyperlipidemia LDL goal <160 Assessment & Plan:  statin therapy  recommended in spite of normal lipid levels as standard of care rec for patients with type DM.   She has deferred   Orders: -     Lipid panel -     COMPLETE METABOLIC PANEL WITH eGFR  Type 2 diabetes mellitus with other specified complication, without long-term current use of insulin -     Hemoglobin A1c -     Microalbumin / creatinine urine ratio -     COMPLETE METABOLIC PANEL WITH eGFR  Type 2 diabetes mellitus with obesity Assessment & Plan:  Encouraged to resume exercising regularly  She has deferred statin therapy today despite discussion ,  Foot exam is normal  today.  Labs due   Lab  Results  Component Value Date   HGBA1C 7.0 (H) 10/15/2021      Orders: -     COMPLETE METABOLIC PANEL WITH eGFR  Preventative health care  Assessment & Plan: age appropriate education and counseling updated, referrals for preventative services and immunizations addressed, dietary and smoking counseling addressed, most recent labs reviewed.  I have personally reviewed and have noted:   1) the patient's medical and social history 2) The pt's use of alcohol, tobacco, and illicit drugs 3) The patient's current medications and supplements 4) Functional ability including ADL's, fall risk, home safety risk, hearing and visual impairment 5) Diet and physical activities 6) Evidence for depression or mood disorder 7) The patient's height, weight, and BMI have been recorded in the chart  I have made referrals, and provided counseling and education based on review of the above    Orders: -     COMPLETE METABOLIC PANEL WITH eGFR  Cellulitis of arm, right Assessment & Plan: Superficial scrape from kitchen pan appears infected.  Keflex prescribed    Encounter for general adult medical examination with abnormal findings Assessment & Plan: age appropriate education and counseling updated, referrals for preventative services and immunizations addressed, dietary and smoking counseling addressed, most recent labs reviewed.  I have personally reviewed and have noted:   1) the patient's medical and social history 2) The pt's use of alcohol, tobacco, and illicit drugs 3) The patient's current medications and supplements 4) Functional ability including ADL's, fall risk, home safety risk, hearing and visual impairment 5) Diet and physical activities 6) Evidence for depression or mood disorder 7) The patient's height, weight, and BMI have been recorded in the chart  I have made referrals, and provided counseling and education based on review of the above     Other orders -     Scopolamine; Place  1 patch (1.5 mg total) onto the skin every 3 (three) days.  Dispense: 4 patch; Refill: 0 -     Cephalexin; Take 1 capsule (500 mg total) by mouth 4 (four) times daily.  Dispense: 28 capsule; Refill: 9      I provided 40 minutes of  face-to-face time during this encounter reviewing patient's current problems and past surgeries,  recent labs and imaging studies, providing counseling on the above mentioned problems , and coordination  of care .   Follow-up: Return in about 6 months (around 10/11/2022) for follow up diabetes.   Sherlene Shams, MD

## 2022-04-12 LAB — COMPLETE METABOLIC PANEL WITH GFR
AG Ratio: 1.8 (calc) (ref 1.0–2.5)
ALT: 12 U/L (ref 6–29)
AST: 14 U/L (ref 10–35)
Albumin: 4.3 g/dL (ref 3.6–5.1)
Alkaline phosphatase (APISO): 57 U/L (ref 37–153)
BUN: 13 mg/dL (ref 7–25)
CO2: 25 mmol/L (ref 20–32)
Calcium: 9.4 mg/dL (ref 8.6–10.4)
Chloride: 105 mmol/L (ref 98–110)
Creat: 0.73 mg/dL (ref 0.50–1.05)
Globulin: 2.4 g/dL (calc) (ref 1.9–3.7)
Glucose, Bld: 102 mg/dL — ABNORMAL HIGH (ref 65–99)
Potassium: 4.7 mmol/L (ref 3.5–5.3)
Sodium: 141 mmol/L (ref 135–146)
Total Bilirubin: 0.3 mg/dL (ref 0.2–1.2)
Total Protein: 6.7 g/dL (ref 6.1–8.1)
eGFR: 90 mL/min/{1.73_m2} (ref >=60)

## 2022-04-12 LAB — LIPID PANEL
Cholesterol: 200 mg/dL — ABNORMAL HIGH (ref <200)
HDL: 71 mg/dL (ref >=50)
LDL Cholesterol (Calc): 103 mg/dL (calc) — ABNORMAL HIGH
Non-HDL Cholesterol (Calc): 129 mg/dL (calc) (ref <130)
Total CHOL/HDL Ratio: 2.8 (calc) (ref <5.0)
Triglycerides: 163 mg/dL — ABNORMAL HIGH (ref <150)

## 2022-04-12 LAB — MICROALBUMIN / CREATININE URINE RATIO
Creatinine, Urine: 55 mg/dL (ref 20–275)
Microalb, Ur: <0.2 mg/dL

## 2022-04-12 LAB — HEMOGLOBIN A1C
Hgb A1c MFr Bld: 6.6 % of total Hgb — ABNORMAL HIGH (ref <5.7)
Mean Plasma Glucose: 143 mg/dL
eAG (mmol/L): 7.9 mmol/L

## 2022-04-13 ENCOUNTER — Encounter: Payer: Self-pay | Admitting: Internal Medicine

## 2022-04-15 ENCOUNTER — Other Ambulatory Visit: Payer: Self-pay | Admitting: Internal Medicine

## 2022-04-15 DIAGNOSIS — E785 Hyperlipidemia, unspecified: Secondary | ICD-10-CM

## 2022-04-15 MED ORDER — ROSUVASTATIN CALCIUM 20 MG PO TABS: 20.0000 mg | ORAL_TABLET | Freq: Every day | ORAL | 0 refills | Status: DC

## 2022-04-23 ENCOUNTER — Telehealth: Payer: Self-pay | Admitting: Internal Medicine

## 2022-04-23 NOTE — Telephone Encounter (Signed)
Copied from CRM [id number]. Topic: Medicare AWV >> Apr 23, 2022  2:02 PM Kathleen Russell wrote: Reason for CRM: Called patient to schedule Medicare Annual Wellness Visit (AWV). Left message for patient to call back and schedule Medicare Annual Wellness Visit (AWV).  Last date of AWV: 04/23/2021  Please schedule an AWVS appointment at any time with [facility] VISIT.  If any questions, please contact me at [phone].    Thank you,  [facility] Support [facility] Direct dial  [phone]

## 2022-05-02 ENCOUNTER — Other Ambulatory Visit (INDEPENDENT_AMBULATORY_CARE_PROVIDER_SITE_OTHER): Payer: PPO

## 2022-05-02 DIAGNOSIS — E785 Hyperlipidemia, unspecified: Secondary | ICD-10-CM | POA: Diagnosis not present

## 2022-05-02 LAB — COMPREHENSIVE METABOLIC PANEL
ALT: 16 U/L (ref 0–35)
AST: 18 U/L (ref 0–37)
Albumin: 4.2 g/dL (ref 3.5–5.2)
Alkaline Phosphatase: 58 U/L (ref 39–117)
BUN: 10 mg/dL (ref 6–23)
CO2: 26 mEq/L (ref 19–32)
Calcium: 9.1 mg/dL (ref 8.4–10.5)
Chloride: 105 mEq/L (ref 96–112)
Creatinine, Ser: 0.73 mg/dL (ref 0.40–1.20)
GFR: 84.87 mL/min (ref >60.00)
Glucose, Bld: 137 mg/dL — ABNORMAL HIGH (ref 70–99)
Potassium: 4.4 mEq/L (ref 3.5–5.1)
Sodium: 139 mEq/L (ref 135–145)
Total Bilirubin: 0.4 mg/dL (ref 0.2–1.2)
Total Protein: 6.7 g/dL (ref 6.0–8.3)

## 2022-05-02 LAB — CK: Total CK: 107 U/L (ref 7–177)

## 2022-05-02 NOTE — Telephone Encounter (Signed)
Contacted Kathleen Russell to schedule their annual wellness visit. Appointment made for 05/23/2022.  Thank you,  [facility] Support [facility] Direct dial  [phone]

## 2022-05-23 ENCOUNTER — Ambulatory Visit (INDEPENDENT_AMBULATORY_CARE_PROVIDER_SITE_OTHER): Payer: PPO

## 2022-05-23 DIAGNOSIS — Z0000 Encounter for general adult medical examination without abnormal findings: Secondary | ICD-10-CM

## 2022-05-23 NOTE — Patient Instructions (Signed)
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
## 2022-05-26 ENCOUNTER — Ambulatory Visit
Admission: RE | Admit: 2022-05-26 | Discharge: 2022-05-26 | Disposition: A | Discharge status: Home / Self Care | Payer: PPO | Source: Ambulatory Visit | Attending: Internal Medicine | Admitting: Internal Medicine

## 2022-05-26 DIAGNOSIS — Z1231 Encounter for screening mammogram for malignant neoplasm of breast: Secondary | ICD-10-CM | POA: Diagnosis not present

## 2022-06-05 DIAGNOSIS — G5603 Carpal tunnel syndrome, bilateral upper limbs: Secondary | ICD-10-CM | POA: Diagnosis not present

## 2022-06-05 DIAGNOSIS — M1811 Unilateral primary osteoarthritis of first carpometacarpal joint, right hand: Secondary | ICD-10-CM | POA: Diagnosis not present

## 2022-07-16 ENCOUNTER — Other Ambulatory Visit: Payer: Self-pay | Admitting: Internal Medicine

## 2022-07-16 DIAGNOSIS — E785 Hyperlipidemia, unspecified: Secondary | ICD-10-CM

## 2022-08-04 DIAGNOSIS — H903 Sensorineural hearing loss, bilateral: Secondary | ICD-10-CM | POA: Diagnosis not present

## 2022-08-04 DIAGNOSIS — J329 Chronic sinusitis, unspecified: Secondary | ICD-10-CM | POA: Diagnosis not present

## 2022-10-06 ENCOUNTER — Other Ambulatory Visit: Payer: Self-pay | Admitting: Internal Medicine

## 2022-10-10 DIAGNOSIS — D2271 Melanocytic nevi of right lower limb, including hip: Secondary | ICD-10-CM | POA: Diagnosis not present

## 2022-10-10 DIAGNOSIS — D2272 Melanocytic nevi of left lower limb, including hip: Secondary | ICD-10-CM | POA: Diagnosis not present

## 2022-10-10 DIAGNOSIS — D225 Melanocytic nevi of trunk: Secondary | ICD-10-CM | POA: Diagnosis not present

## 2022-10-10 DIAGNOSIS — L565 Disseminated superficial actinic porokeratosis (DSAP): Secondary | ICD-10-CM | POA: Diagnosis not present

## 2022-10-10 DIAGNOSIS — D2261 Melanocytic nevi of right upper limb, including shoulder: Secondary | ICD-10-CM | POA: Diagnosis not present

## 2022-10-10 DIAGNOSIS — D2262 Melanocytic nevi of left upper limb, including shoulder: Secondary | ICD-10-CM | POA: Diagnosis not present

## 2022-10-10 DIAGNOSIS — L821 Other seborrheic keratosis: Secondary | ICD-10-CM | POA: Diagnosis not present

## 2022-10-10 DIAGNOSIS — Z85828 Personal history of other malignant neoplasm of skin: Secondary | ICD-10-CM | POA: Diagnosis not present

## 2022-10-15 ENCOUNTER — Ambulatory Visit: Payer: PPO | Admitting: Internal Medicine

## 2022-10-23 DIAGNOSIS — M65312 Trigger thumb, left thumb: Secondary | ICD-10-CM | POA: Diagnosis not present

## 2022-10-23 DIAGNOSIS — M65342 Trigger finger, left ring finger: Secondary | ICD-10-CM | POA: Diagnosis not present

## 2022-10-23 DIAGNOSIS — M65332 Trigger finger, left middle finger: Secondary | ICD-10-CM | POA: Diagnosis not present

## 2022-11-14 ENCOUNTER — Ambulatory Visit (INDEPENDENT_AMBULATORY_CARE_PROVIDER_SITE_OTHER): Payer: PPO | Admitting: Internal Medicine

## 2022-11-14 ENCOUNTER — Encounter: Payer: Self-pay | Admitting: Internal Medicine

## 2022-11-14 VITALS — BP 116/74 | HR 84 | Temp 97.6°F | Ht 64.0 in | Wt 180.6 lb

## 2022-11-14 DIAGNOSIS — Z23 Encounter for immunization: Secondary | ICD-10-CM | POA: Diagnosis not present

## 2022-11-14 DIAGNOSIS — E669 Obesity, unspecified: Secondary | ICD-10-CM | POA: Diagnosis not present

## 2022-11-14 DIAGNOSIS — E785 Hyperlipidemia, unspecified: Secondary | ICD-10-CM | POA: Diagnosis not present

## 2022-11-14 DIAGNOSIS — E1169 Type 2 diabetes mellitus with other specified complication: Secondary | ICD-10-CM

## 2022-11-14 DIAGNOSIS — M25561 Pain in right knee: Secondary | ICD-10-CM | POA: Diagnosis not present

## 2022-11-14 NOTE — Patient Instructions (Signed)
No medication changes today .  If you decide you want a ultrasound of your right knee ,  send me a message

## 2022-11-14 NOTE — Progress Notes (Addendum)
 Subjective:  Patient ID: Kathleen Russell, female    DOB: 11-27-1955  Age: 67 y.o. MRN: [id number]  CC: The primary encounter diagnosis was Type 2 diabetes mellitus with obesity (HCC). Diagnoses of Obesity (BMI 30-39.9), Hyperlipidemia LDL goal <160, Encounter for administration of vaccine, and Acute pain of right knee were also pertinent to this visit.   HPI Kathleen Russell presents for  Chief Complaint  Patient presents with  . Medical Management of Chronic Issues   Since her last visit she has developed  TRIGGER FINGER ON LEFT HAND  thumb and middle.  .  Prior CTS  surgery .  Going to see Gramig for surgery,  deferred for a new months   Cc: right knee pain for the past month,  felt a twinge during a rollover at night,  could barely walk the next day.  Soreness radiated to shin   no swelling or bruising Wore  brace , better    T2DM:  She  feels generally well,  But is not  exercising regularly due to multiple orthopedic unresolved issues.. was elevated recently due to use of steroids.  Average post prandial this week  was 154 .  BS have been under 130 fasting and < 150 post prandially.  Denies any recent hypoglyemic events.  Diet controlled .  Following a carbohydrate modified diet 6 days per week. Denies numbness, burning and tingling of extremities. Appetite is good.      Outpatient Medications Prior to Visit  Medication Sig Dispense Refill  . acetaminophen (TYLENOL) 500 MG tablet Take by mouth.    . blood glucose meter kit and supplies Dispense based on patient and insurance preference. Use up to four times daily as directed. (FOR ICD-10 E10.9, E11.9). 1 each 0  . cetirizine (ZYRTEC) 10 MG tablet Take 10 mg by mouth daily.    Marland Kitchen esomeprazole (NEXIUM) 40 MG capsule TAKE 1 CAPSULE BY MOUTH DAILY BEFORE BREAKFAST 90 capsule 1  . estradiol (ESTRACE) 0.1 MG/GM vaginal cream INSERT 1 APPLICATORFUL VAGINALLY 3 TIMES A WEEK 42.5 g 12  . fluticasone (FLONASE) 50 MCG/ACT nasal spray SHAKE  LIQUID AND USE 2 SPRAYS IN EACH NOSTRIL AT BEDTIME AS NEEDED 16 g 5  . metroNIDAZOLE (METROGEL) 1 % gel Apply topically daily. 45 g 0  . minoxidil (MINOXIDIL FOR WOMEN) 2 % external solution Apply topically 2 (two) times daily. 60 mL 0  . Multiple Vitamin (MULTIVITAMIN) tablet Take 1 tablet by mouth daily.    . rosuvastatin (CRESTOR) 20 MG tablet TAKE 1 TABLET(20 MG) BY MOUTH DAILY 90 tablet 1  . valACYclovir (VALTREX) 1000 MG tablet Take 1 tablet (1,000 mg total) by mouth 3 (three) times daily. KEEP ON FILE FOR FUTURE REFILLS 21 tablet 1  . VENTOLIN HFA 108 (90 Base) MCG/ACT inhaler Inhale 2 puffs into the lungs every 6 (six) hours as needed. 1 each 1   No facility-administered medications prior to visit.    Review of Systems;  Patient denies headache, fevers, malaise, unintentional weight loss, skin rash, eye pain, sinus congestion and sinus pain, sore throat, dysphagia,  hemoptysis , cough, dyspnea, wheezing, chest pain, palpitations, orthopnea, edema, abdominal pain, nausea, melena, diarrhea, constipation, flank pain, dysuria, hematuria, urinary  Frequency, nocturia, numbness, tingling, seizures,  Focal weakness, Loss of consciousness,  Tremor, insomnia, depression, anxiety, and suicidal ideation.      Objective:  BP 116/74   Pulse 84   Temp 97.6 F (36.4 C)   Ht 5\' 4"  (1.626 m)  Wt 180 lb 9.6 oz (81.9 kg)   SpO2 98%   BMI 31.00 kg/m   BP Readings from Last 3 Encounters:  11/14/22 116/74  04/11/22 110/60  12/04/21 126/76    Wt Readings from Last 3 Encounters:  11/14/22 180 lb 9.6 oz (81.9 kg)  04/11/22 181 lb 6.4 oz (82.3 kg)  12/04/21 177 lb 12.8 oz (80.6 kg)    Physical Exam Vitals reviewed.  Constitutional:      General: She is not in acute distress.    Appearance: Normal appearance. She is normal weight. She is not ill-appearing, toxic-appearing or diaphoretic.  HENT:     Head: Normocephalic.  Eyes:     General: No scleral icterus.       Right eye: No  discharge.        Left eye: No discharge.     Conjunctiva/sclera: Conjunctivae normal.  Cardiovascular:     Rate and Rhythm: Normal rate and regular rhythm.     Heart sounds: Normal heart sounds.  Pulmonary:     Effort: Pulmonary effort is normal. No respiratory distress.     Breath sounds: Normal breath sounds.  Musculoskeletal:        General: Tenderness present. Normal range of motion.     Right knee: Effusion present. Tenderness present over the medial joint line.     Left knee: Normal. No tenderness.       Legs:  Skin:    General: Skin is warm and dry.  Neurological:     General: No focal deficit present.     Mental Status: She is alert and oriented to person, place, and time. Mental status is at baseline.  Psychiatric:        Mood and Affect: Mood normal.        Behavior: Behavior normal.        Thought Content: Thought content normal.        Judgment: Judgment normal.   Lab Results  Component Value Date   HGBA1C 6.6 (H) 04/11/2022   HGBA1C 7.0 (H) 10/15/2021   HGBA1C 6.5 06/24/2021    Lab Results  Component Value Date   CREATININE 0.73 05/02/2022   CREATININE 0.73 04/11/2022   CREATININE 0.87 10/15/2021    Lab Results  Component Value Date   WBC 4.2 03/22/2021   HGB 13.4 03/22/2021   HCT 41.2 03/22/2021   PLT 237.0 03/22/2021   GLUCOSE 137 (H) 05/02/2022   CHOL 200 (H) 04/11/2022   TRIG 163 (H) 04/11/2022   HDL 71 04/11/2022   LDLDIRECT 101.0 10/15/2021   LDLCALC 103 (H) 04/11/2022   ALT 16 05/02/2022   AST 18 05/02/2022   NA 139 05/02/2022   K 4.4 05/02/2022   CL 105 05/02/2022   CREATININE 0.73 05/02/2022   BUN 10 05/02/2022   CO2 26 05/02/2022   TSH 3.63 03/22/2021   HGBA1C 6.6 (H) 04/11/2022   MICROALBUR <0.2 04/11/2022    MM 3D SCREENING MAMMOGRAM BILATERAL BREAST  Result Date: 05/27/2022 CLINICAL DATA:  Screening. EXAM: DIGITAL SCREENING BILATERAL MAMMOGRAM WITH TOMOSYNTHESIS AND CAD TECHNIQUE: Bilateral screening digital craniocaudal  and mediolateral oblique mammograms were obtained. Bilateral screening digital breast tomosynthesis was performed. The images were evaluated with computer-aided detection. COMPARISON:  Previous exam(s). ACR Breast Density Category b: There are scattered areas of fibroglandular density. FINDINGS: There are no findings suspicious for malignancy. IMPRESSION: No mammographic evidence of malignancy. A result letter of this screening mammogram will be mailed directly to the patient. RECOMMENDATION: Screening mammogram  in one year. (Code:[id number]-B-01Y) BI-RADS CATEGORY  1: Negative. Electronically Signed   By: Norva Pavlov M.D.   On: 05/27/2022 14:03    Assessment & Plan:  .Type 2 diabetes mellitus with obesity (HCC) Assessment & Plan: She has  resumed exercising regularly  She has been taking  statin therapy  and foot exam is normal  today.  Labs due   Lab Results  Component Value Date   HGBA1C 6.6 (H) 04/11/2022      Orders: -     Comprehensive metabolic panel; Future -     Hemoglobin A1c; Future  Obesity (BMI 30-39.9) -     CBC with Differential/Platelet; Future -     TSH; Future  Hyperlipidemia LDL goal <160 -     LDL cholesterol, direct; Future -     Lipid panel; Future  Encounter for administration of vaccine -     Flu Vaccine Trivalent High Dose (Fluad)  Acute pain of right knee Assessment & Plan: Nontraumatic.  Suspect patellar instability vs Baker's Cyst.  Ultraound offered but deferred       Follow-up: Return in about 6 months (around 05/14/2023) for follow up diabetes.   Sherlene Shams, MD

## 2022-11-15 DIAGNOSIS — M25561 Pain in right knee: Secondary | ICD-10-CM | POA: Insufficient documentation

## 2022-11-15 NOTE — Assessment & Plan Note (Signed)
Nontraumatic.  Suspect patellar instability vs Baker's Cyst.  Ultraound offered but deferred

## 2022-11-15 NOTE — Assessment & Plan Note (Signed)
She has  resumed exercising regularly  She has been taking  statin therapy  and foot exam is normal  today.  Labs due   Lab Results  Component Value Date   HGBA1C 6.6 (H) 04/11/2022

## 2022-11-17 ENCOUNTER — Other Ambulatory Visit (INDEPENDENT_AMBULATORY_CARE_PROVIDER_SITE_OTHER): Payer: PPO

## 2022-11-17 DIAGNOSIS — E785 Hyperlipidemia, unspecified: Secondary | ICD-10-CM

## 2022-11-17 DIAGNOSIS — E1169 Type 2 diabetes mellitus with other specified complication: Secondary | ICD-10-CM

## 2022-11-17 DIAGNOSIS — E669 Obesity, unspecified: Secondary | ICD-10-CM | POA: Diagnosis not present

## 2022-11-17 LAB — COMPREHENSIVE METABOLIC PANEL
ALT: 17 U/L (ref 0–35)
AST: 19 U/L (ref 0–37)
Albumin: 4.2 g/dL (ref 3.5–5.2)
Alkaline Phosphatase: 52 U/L (ref 39–117)
BUN: 13 mg/dL (ref 6–23)
CO2: 24 meq/L (ref 19–32)
Calcium: 9.2 mg/dL (ref 8.4–10.5)
Chloride: 104 meq/L (ref 96–112)
Creatinine, Ser: 0.74 mg/dL (ref 0.40–1.20)
GFR: 83.18 mL/min (ref >60.00)
Glucose, Bld: 128 mg/dL — ABNORMAL HIGH (ref 70–99)
Potassium: 4.1 meq/L (ref 3.5–5.1)
Sodium: 137 meq/L (ref 135–145)
Total Bilirubin: 0.6 mg/dL (ref 0.2–1.2)
Total Protein: 7 g/dL (ref 6.0–8.3)

## 2022-11-17 LAB — CBC WITH DIFFERENTIAL/PLATELET
Basophils Absolute: 0 10*3/uL (ref 0.0–0.1)
Basophils Relative: 0.6 % (ref 0.0–3.0)
Eosinophils Absolute: 0.1 10*3/uL (ref 0.0–0.7)
Eosinophils Relative: 2.1 % (ref 0.0–5.0)
HCT: 41.8 % (ref 36.0–46.0)
Hemoglobin: 13.8 g/dL (ref 12.0–15.0)
Lymphocytes Relative: 50.6 % — ABNORMAL HIGH (ref 12.0–46.0)
Lymphs Abs: 2.2 10*3/uL (ref 0.7–4.0)
MCHC: 33.1 g/dL (ref 30.0–36.0)
MCV: 86.8 fL (ref 78.0–100.0)
Monocytes Absolute: 0.3 10*3/uL (ref 0.1–1.0)
Monocytes Relative: 7 % (ref 3.0–12.0)
Neutro Abs: 1.7 10*3/uL (ref 1.4–7.7)
Neutrophils Relative %: 39.7 % — ABNORMAL LOW (ref 43.0–77.0)
Platelets: 209 10*3/uL (ref 150.0–400.0)
RBC: 4.81 Mil/uL (ref 3.87–5.11)
RDW: 13.9 % (ref 11.5–15.5)
WBC: 4.3 10*3/uL (ref 4.0–10.5)

## 2022-11-17 LAB — LDL CHOLESTEROL, DIRECT: Direct LDL: 53 mg/dL

## 2022-11-17 LAB — LIPID PANEL
Cholesterol: 136 mg/dL (ref 0–200)
HDL: 64.1 mg/dL (ref >39.00)
LDL Cholesterol: 47 mg/dL (ref 0–99)
NonHDL: 72.28
Total CHOL/HDL Ratio: 2
Triglycerides: 125 mg/dL (ref 0.0–149.0)
VLDL: 25 mg/dL (ref 0.0–40.0)

## 2022-11-17 LAB — HEMOGLOBIN A1C: Hgb A1c MFr Bld: 7.2 % — ABNORMAL HIGH (ref 4.6–6.5)

## 2022-11-17 LAB — TSH: TSH: 3.94 u[IU]/mL (ref 0.35–5.50)

## 2023-01-08 ENCOUNTER — Other Ambulatory Visit: Payer: Self-pay | Admitting: Internal Medicine

## 2023-01-08 DIAGNOSIS — E785 Hyperlipidemia, unspecified: Secondary | ICD-10-CM

## 2023-01-10 ENCOUNTER — Other Ambulatory Visit: Payer: Self-pay | Admitting: Internal Medicine

## 2023-01-10 DIAGNOSIS — E785 Hyperlipidemia, unspecified: Secondary | ICD-10-CM

## 2023-01-13 ENCOUNTER — Encounter: Payer: Self-pay | Admitting: Internal Medicine

## 2023-01-13 DIAGNOSIS — E785 Hyperlipidemia, unspecified: Secondary | ICD-10-CM

## 2023-01-14 DIAGNOSIS — H2513 Age-related nuclear cataract, bilateral: Secondary | ICD-10-CM | POA: Diagnosis not present

## 2023-01-14 DIAGNOSIS — E119 Type 2 diabetes mellitus without complications: Secondary | ICD-10-CM | POA: Diagnosis not present

## 2023-01-14 DIAGNOSIS — D3132 Benign neoplasm of left choroid: Secondary | ICD-10-CM | POA: Diagnosis not present

## 2023-01-14 LAB — HM DIABETES EYE EXAM

## 2023-01-14 MED ORDER — ROSUVASTATIN CALCIUM 20 MG PO TABS: ORAL_TABLET | ORAL | 1 refills | Status: DC

## 2023-01-16 ENCOUNTER — Ambulatory Visit
Admission: RE | Admit: 2023-01-16 | Discharge: 2023-01-16 | Disposition: A | Discharge status: Home / Self Care | Payer: PPO | Source: Ambulatory Visit | Attending: Emergency Medicine | Admitting: Emergency Medicine

## 2023-01-16 VITALS — BP 120/88 | HR 97 | Temp 98.2°F | Resp 18

## 2023-01-16 DIAGNOSIS — J0100 Acute maxillary sinusitis, unspecified: Secondary | ICD-10-CM | POA: Diagnosis not present

## 2023-01-16 MED ORDER — AMOXICILLIN-POT CLAVULANATE 875-125 MG PO TABS: 1.0000 | ORAL_TABLET | Freq: Two times a day (BID) | ORAL | 0 refills | Status: DC

## 2023-01-16 NOTE — Discharge Instructions (Addendum)
 Take the Augmentin as directed.  Follow-up with your primary care provider or ENT if your symptoms are not improving.
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
## 2023-01-16 NOTE — ED Triage Notes (Signed)
 Patient to Urgent Care with complaints of cough/ nasal congestion/ drainage/ headache. Concerned about a possible sinus infection.  Symptoms x1 week/ cough since christmas. Symptoms worse at night.   Using Albuterol inhaler/ extra-strength tylenol.

## 2023-01-29 DIAGNOSIS — M189 Osteoarthritis of first carpometacarpal joint, unspecified: Secondary | ICD-10-CM | POA: Diagnosis not present

## 2023-01-29 DIAGNOSIS — M1811 Unilateral primary osteoarthritis of first carpometacarpal joint, right hand: Secondary | ICD-10-CM | POA: Diagnosis not present

## 2023-01-29 DIAGNOSIS — M79644 Pain in right finger(s): Secondary | ICD-10-CM | POA: Diagnosis not present

## 2023-01-29 DIAGNOSIS — M65332 Trigger finger, left middle finger: Secondary | ICD-10-CM | POA: Diagnosis not present

## 2023-02-02 DIAGNOSIS — J340 Abscess, furuncle and carbuncle of nose: Secondary | ICD-10-CM | POA: Diagnosis not present

## 2023-02-03 ENCOUNTER — Telehealth: Payer: Self-pay | Admitting: Internal Medicine

## 2023-02-03 DIAGNOSIS — E1169 Type 2 diabetes mellitus with other specified complication: Secondary | ICD-10-CM

## 2023-02-03 NOTE — Telephone Encounter (Signed)
Patient need lab orders.

## 2023-02-09 ENCOUNTER — Other Ambulatory Visit (INDEPENDENT_AMBULATORY_CARE_PROVIDER_SITE_OTHER): Payer: PPO

## 2023-02-09 ENCOUNTER — Other Ambulatory Visit: Payer: PPO

## 2023-02-09 DIAGNOSIS — E1169 Type 2 diabetes mellitus with other specified complication: Secondary | ICD-10-CM | POA: Diagnosis not present

## 2023-02-09 DIAGNOSIS — E669 Obesity, unspecified: Secondary | ICD-10-CM | POA: Diagnosis not present

## 2023-02-09 LAB — COMPREHENSIVE METABOLIC PANEL WITH GFR
ALT: 13 U/L (ref 0–35)
AST: 14 U/L (ref 0–37)
Albumin: 3.8 g/dL (ref 3.5–5.2)
Alkaline Phosphatase: 48 U/L (ref 39–117)
BUN: 9 mg/dL (ref 6–23)
CO2: 25 meq/L (ref 19–32)
Calcium: 8.9 mg/dL (ref 8.4–10.5)
Chloride: 105 meq/L (ref 96–112)
Creatinine, Ser: 0.76 mg/dL (ref 0.40–1.20)
GFR: 80.43 mL/min
Glucose, Bld: 130 mg/dL — ABNORMAL HIGH (ref 70–99)
Potassium: 4 meq/L (ref 3.5–5.1)
Sodium: 137 meq/L (ref 135–145)
Total Bilirubin: 0.5 mg/dL (ref 0.2–1.2)
Total Protein: 6.4 g/dL (ref 6.0–8.3)

## 2023-02-09 LAB — HEMOGLOBIN A1C: Hgb A1c MFr Bld: 7.4 % — ABNORMAL HIGH (ref 4.6–6.5)

## 2023-02-10 ENCOUNTER — Encounter: Payer: Self-pay | Admitting: Internal Medicine

## 2023-03-09 DIAGNOSIS — J340 Abscess, furuncle and carbuncle of nose: Secondary | ICD-10-CM | POA: Diagnosis not present

## 2023-03-10 ENCOUNTER — Encounter: Payer: Self-pay | Admitting: Internal Medicine

## 2023-03-10 ENCOUNTER — Ambulatory Visit (INDEPENDENT_AMBULATORY_CARE_PROVIDER_SITE_OTHER): Payer: PPO | Admitting: Internal Medicine

## 2023-03-10 VITALS — BP 132/66 | HR 85 | Ht 64.0 in | Wt 181.6 lb

## 2023-03-10 DIAGNOSIS — K8689 Other specified diseases of pancreas: Secondary | ICD-10-CM

## 2023-03-10 DIAGNOSIS — R102 Pelvic and perineal pain: Secondary | ICD-10-CM

## 2023-03-10 DIAGNOSIS — R1011 Right upper quadrant pain: Secondary | ICD-10-CM

## 2023-03-10 DIAGNOSIS — R1900 Intra-abdominal and pelvic swelling, mass and lump, unspecified site: Secondary | ICD-10-CM | POA: Diagnosis not present

## 2023-03-10 DIAGNOSIS — E669 Obesity, unspecified: Secondary | ICD-10-CM

## 2023-03-10 DIAGNOSIS — E1169 Type 2 diabetes mellitus with other specified complication: Secondary | ICD-10-CM | POA: Diagnosis not present

## 2023-03-10 DIAGNOSIS — Z6831 Body mass index (BMI) 31.0-31.9, adult: Secondary | ICD-10-CM

## 2023-03-10 MED ORDER — ESTRADIOL 0.1 MG/GM VA CREA: TOPICAL_CREAM | VAGINAL | 12 refills | Status: DC

## 2023-03-10 MED ORDER — ESOMEPRAZOLE MAGNESIUM 40 MG PO CPDR: DELAYED_RELEASE_CAPSULE | ORAL | 1 refills | Status: DC

## 2023-03-10 NOTE — Progress Notes (Unsigned)
 Subjective:  Patient ID: Kathleen Russell, female    DOB: 04-Oct-1955  Age: 68 y.o. MRN: [id number]  CC: There were no encounter diagnoses.   HPI [name] presents for  Chief Complaint  Patient presents with   Medical Management of Chronic Issues   1) Treated for acute sinusitis I  id Jan by urgent care with augmentin.    2) Type 2 DM:  recent labs reviewed noting a rise in A1c to 7.4 takes no meds.   3) has transferred to Dr Amanda Pea for hand issues  including trigger finger.  Has received several injections (steroid) which has caused significant BS elevations,   to 24  for up to ten days.  Her  left hand has improved  in terms of trigger finger  since January injection , but surgery is still recommended .  Her hands have been more painful this week  since she has been babysitting her 4 grandchildren   4) new onset intermittent pain under both shoulder blades (alternating sides) occurring more often on the right side.  Similar to when she had gallstones in 2021 .  Occurs post prandially,  not immediately  described as discomfort,   for the past month. also had a prolonged episode of suprapubic  pain  recently without discharge  , mild dysuria. Which passed   5) occasional diarrhea,  fecal urgency ,  for several days in a row every month 2-3 days in a row.  History of diverticulitis, renote.  Denies fevers ,  nausea . Has normal stools during this time .    Exam : tender in both lower quadrants and near umbilicus   Outpatient Medications Prior to Visit  Medication Sig Dispense Refill   acetaminophen (TYLENOL) 500 MG tablet Take by mouth.     blood glucose meter kit and supplies Dispense based on patient and insurance preference. Use up to four times daily as directed. (FOR ICD-10 E10.9, E11.9). 1 each 0   cetirizine (ZYRTEC) 10 MG tablet Take 10 mg by mouth daily.     diclofenac Sodium (VOLTAREN) 1 % GEL Apply topically 4 (four) times daily.     esomeprazole (NEXIUM) 40 MG capsule  TAKE 1 CAPSULE BY MOUTH DAILY BEFORE BREAKFAST 90 capsule 1   estradiol (ESTRACE) 0.1 MG/GM vaginal cream INSERT 1 APPLICATORFUL VAGINALLY 3 TIMES A WEEK 42.5 g 12   fluticasone (FLONASE) 50 MCG/ACT nasal spray SHAKE LIQUID AND USE 2 SPRAYS IN EACH NOSTRIL AT BEDTIME AS NEEDED 16 g 5   metroNIDAZOLE (METROGEL) 1 % gel Apply topically daily. 45 g 0   Multiple Vitamin (MULTIVITAMIN) tablet Take 1 tablet by mouth daily.     rosuvastatin (CRESTOR) 20 MG tablet TAKE 1 TABLET(20 MG) BY MOUTH DAILY 90 tablet 1   valACYclovir (VALTREX) 1000 MG tablet Take 1 tablet (1,000 mg total) by mouth 3 (three) times daily. KEEP ON FILE FOR FUTURE REFILLS 21 tablet 1   VENTOLIN HFA 108 (90 Base) MCG/ACT inhaler Inhale 2 puffs into the lungs every 6 (six) hours as needed. 1 each 1   minoxidil (MINOXIDIL FOR WOMEN) 2 % external solution Apply topically 2 (two) times daily. (Patient not taking: Reported on 03/10/2023) 60 mL 0   amoxicillin-clavulanate (AUGMENTIN) 875-125 MG tablet Take 1 tablet by mouth every 12 (twelve) hours. (Patient not taking: Reported on 03/10/2023) 14 tablet 0   No facility-administered medications prior to visit.    Review of Systems;  Patient denies headache, fevers, malaise, unintentional  weight loss, skin rash, eye pain, sinus congestion and sinus pain, sore throat, dysphagia,  hemoptysis , cough, dyspnea, wheezing, chest pain, palpitations, orthopnea, edema, abdominal pain, nausea, melena, diarrhea, constipation, flank pain, dysuria, hematuria, urinary  Frequency, nocturia, numbness, tingling, seizures,  Focal weakness, Loss of consciousness,  Tremor, insomnia, depression, anxiety, and suicidal ideation.      Objective:  BP 132/66   Pulse 85   Ht 5\' 4"  (1.626 m)   Wt 181 lb 9.6 oz (82.4 kg)   SpO2 96%   BMI 31.17 kg/m   BP Readings from Last 3 Encounters:  03/10/23 132/66  01/16/23 120/88  11/14/22 116/74    Wt Readings from Last 3 Encounters:  03/10/23 181 lb 9.6 oz (82.4 kg)   11/14/22 180 lb 9.6 oz (81.9 kg)  04/11/22 181 lb 6.4 oz (82.3 kg)    Physical Exam  Lab Results  Component Value Date   HGBA1C 7.4 (H) 02/09/2023   HGBA1C 7.2 (H) 11/17/2022   HGBA1C 6.6 (H) 04/11/2022    Lab Results  Component Value Date   CREATININE 0.76 02/09/2023   CREATININE 0.74 11/17/2022   CREATININE 0.73 05/02/2022    Lab Results  Component Value Date   WBC 4.3 11/17/2022   HGB 13.8 11/17/2022   HCT 41.8 11/17/2022   PLT 209.0 11/17/2022   GLUCOSE 130 (H) 02/09/2023   CHOL 136 11/17/2022   TRIG 125.0 11/17/2022   HDL 64.10 11/17/2022   LDLDIRECT 53.0 11/17/2022   LDLCALC 47 11/17/2022   ALT 13 02/09/2023   AST 14 02/09/2023   NA 137 02/09/2023   K 4.0 02/09/2023   CL 105 02/09/2023   CREATININE 0.76 02/09/2023   BUN 9 02/09/2023   CO2 25 02/09/2023   TSH 3.94 11/17/2022   HGBA1C 7.4 (H) 02/09/2023   MICROALBUR <0.2 04/11/2022    No results found.  Assessment & Plan:  .There are no diagnoses linked to this encounter.   I spent 34 minutes on the day of this face to face encounter reviewing patient's  most recent visit with cardiology,  nephrology,  and neurology,  prior relevant surgical and non surgical procedures, recent  labs and imaging studies, counseling on weight management,  reviewing the assessment and plan with patient, and post visit ordering and reviewing of  diagnostics and therapeutics with patient  .   Follow-up: No follow-ups on file.   Sherlene Shams, MD

## 2023-03-10 NOTE — Patient Instructions (Signed)
 I will plan on starting you on a medication for diabetes management after I SEE  the reults of the urinalsis and CT scan ordered

## 2023-03-11 ENCOUNTER — Encounter: Payer: Self-pay | Admitting: Internal Medicine

## 2023-03-11 DIAGNOSIS — R1011 Right upper quadrant pain: Secondary | ICD-10-CM | POA: Insufficient documentation

## 2023-03-11 DIAGNOSIS — R102 Pelvic and perineal pain: Secondary | ICD-10-CM | POA: Insufficient documentation

## 2023-03-11 LAB — URINALYSIS, ROUTINE W REFLEX MICROSCOPIC
Bilirubin Urine: NEGATIVE
Hgb urine dipstick: NEGATIVE
Ketones, ur: NEGATIVE
Leukocytes,Ua: NEGATIVE
Nitrite: NEGATIVE
RBC / HPF: NONE SEEN (ref 0–?)
Specific Gravity, Urine: 1.015 (ref 1.000–1.030)
Total Protein, Urine: NEGATIVE
Urine Glucose: NEGATIVE
Urobilinogen, UA: 0.2 (ref 0.0–1.0)
pH: 6 (ref 5.0–8.0)

## 2023-03-11 LAB — URINE CULTURE
MICRO NUMBER:: 16156604
SPECIMEN QUALITY:: ADEQUATE

## 2023-03-11 LAB — MICROALBUMIN / CREATININE URINE RATIO
Creatinine,U: 38.6 mg/dL
Microalb Creat Ratio: 18.2 mg/g (ref 0.0–30.0)
Microalb, Ur: <0.7 mg/dL (ref 0.0–1.9)

## 2023-03-11 NOTE — Assessment & Plan Note (Signed)
 Discussed adding GLP 1 agonist but need to evaluate RUQ pain first.  CT AB pelvi ordered given diffuse pain on exam and h/o cholecystectomy and diverticulitis .  Recommending use of basal insulin after next steroid injection

## 2023-03-11 NOTE — Assessment & Plan Note (Signed)
 UA/culture  ordered,  CT ab pelvis also ordered give h/o intermitttent diarrhea and diverticulitis

## 2023-03-12 ENCOUNTER — Telehealth: Payer: Self-pay | Admitting: Internal Medicine

## 2023-03-12 NOTE — Telephone Encounter (Signed)
 Lft pt vm to call ofc to sch CT. thanks

## 2023-03-13 ENCOUNTER — Telehealth: Payer: Self-pay | Admitting: Internal Medicine

## 2023-03-13 NOTE — Telephone Encounter (Signed)
 Lft pt vm to call ofc to sch CT. thanks

## 2023-03-27 ENCOUNTER — Other Ambulatory Visit

## 2023-04-04 ENCOUNTER — Other Ambulatory Visit: Payer: Self-pay | Admitting: Internal Medicine

## 2023-04-05 ENCOUNTER — Other Ambulatory Visit: Payer: Self-pay | Admitting: Internal Medicine

## 2023-04-06 ENCOUNTER — Ambulatory Visit
Admission: RE | Admit: 2023-04-06 | Discharge: 2023-04-06 | Disposition: A | Discharge status: Home / Self Care | Source: Ambulatory Visit | Attending: Internal Medicine | Admitting: Internal Medicine

## 2023-04-06 DIAGNOSIS — N281 Cyst of kidney, acquired: Secondary | ICD-10-CM | POA: Diagnosis not present

## 2023-04-06 DIAGNOSIS — K5730 Diverticulosis of large intestine without perforation or abscess without bleeding: Secondary | ICD-10-CM | POA: Diagnosis not present

## 2023-04-06 DIAGNOSIS — K439 Ventral hernia without obstruction or gangrene: Secondary | ICD-10-CM | POA: Diagnosis not present

## 2023-04-06 DIAGNOSIS — R102 Pelvic and perineal pain: Secondary | ICD-10-CM | POA: Insufficient documentation

## 2023-04-06 DIAGNOSIS — R1011 Right upper quadrant pain: Secondary | ICD-10-CM | POA: Insufficient documentation

## 2023-04-06 DIAGNOSIS — K8689 Other specified diseases of pancreas: Secondary | ICD-10-CM | POA: Diagnosis not present

## 2023-04-06 LAB — POCT I-STAT CREATININE: Creatinine, Ser: 0.7 mg/dL (ref 0.44–1.00)

## 2023-04-06 MED ORDER — IOHEXOL 300 MG/ML  SOLN
100.0000 mL | Freq: Once | INTRAMUSCULAR | Status: AC | PRN
  Administered 2023-04-06: 100 mL via INTRAVENOUS

## 2023-04-09 ENCOUNTER — Ambulatory Visit: Admitting: Internal Medicine

## 2023-04-10 NOTE — Addendum Note (Signed)
 Addended by: Sherlene Shams on: 04/10/2023 02:50 PM   Modules accepted: Orders

## 2023-04-13 ENCOUNTER — Other Ambulatory Visit: Payer: Self-pay | Admitting: Internal Medicine

## 2023-04-13 DIAGNOSIS — Z1231 Encounter for screening mammogram for malignant neoplasm of breast: Secondary | ICD-10-CM

## 2023-04-14 ENCOUNTER — Encounter: Payer: Self-pay | Admitting: Internal Medicine

## 2023-04-14 ENCOUNTER — Ambulatory Visit: Admitting: Internal Medicine

## 2023-04-14 VITALS — BP 126/80 | HR 84 | Ht 64.0 in | Wt 177.8 lb

## 2023-04-14 DIAGNOSIS — K429 Umbilical hernia without obstruction or gangrene: Secondary | ICD-10-CM

## 2023-04-14 DIAGNOSIS — E785 Hyperlipidemia, unspecified: Secondary | ICD-10-CM

## 2023-04-14 DIAGNOSIS — E1169 Type 2 diabetes mellitus with other specified complication: Secondary | ICD-10-CM

## 2023-04-14 DIAGNOSIS — K8689 Other specified diseases of pancreas: Secondary | ICD-10-CM | POA: Insufficient documentation

## 2023-04-14 DIAGNOSIS — E669 Obesity, unspecified: Secondary | ICD-10-CM

## 2023-04-14 MED ORDER — SITAGLIPTIN PHOSPHATE 25 MG PO TABS: 25.0000 mg | ORAL_TABLET | Freq: Every day | ORAL | 1 refills | Status: DC

## 2023-04-14 NOTE — Assessment & Plan Note (Addendum)
 LDL is at goal on atorvastatin . No changes today  Lab Results  Component Value Date   CHOL 136 11/17/2022   HDL 64.10 11/17/2022   LDLCALC 47 11/17/2022   LDLDIRECT 53.0 11/17/2022   TRIG 125.0 11/17/2022   CHOLHDL 2 11/17/2022   Lab Results  Component Value Date   ALT 13 02/09/2023   AST 14 02/09/2023   ALKPHOS 48 02/09/2023   BILITOT 0.5 02/09/2023

## 2023-04-14 NOTE — Assessment & Plan Note (Signed)
 GLP1 contraindicated until pancreatic mass is rule out.  Starting [place] .  CONTINUE STATIN   Lab Results  Component Value Date   HGBA1C 7.4 (H) 02/09/2023   Lab Results  Component Value Date   MICROALBUR <0.7 03/10/2023   MICROALBUR <0.2 04/11/2022

## 2023-04-14 NOTE — Assessment & Plan Note (Signed)
 Noted on CT of abdomen and pelvis done to evaluate c/o abd pain which is still present .  MRI abdomen ordered

## 2023-04-14 NOTE — Progress Notes (Signed)
 Subjective:  Patient ID: Kathleen Russell, female    DOB: 1955/05/27  Age: 68 y.o. MRN: [id number]  CC: The primary encounter diagnosis was Umbilical hernia without obstruction or gangrene. Diagnoses of Type 2 diabetes mellitus with obesity (HCC), Hyperlipidemia LDL goal <70, and Pancreatic mass were also pertinent to this visit.   HPI Kathleen Russell presents for  Chief Complaint  Patient presents with   Medical Management of Chronic Issues    1 month follow up    1)  TYPE 2 DM:   several steroid injections in thumbs in January , noted BS were quite elevated for several weeks  up to 280 ,  but have decreased.  Not using a CBG monitor .  Home readings using Onetouch meter reviewed today note fastings that have ranged from 15 to 189  and  2 hr ost prandials are all above 160  on diet alone.  Walking daily for exercise  2) abd pain radiates to left thoracic area after eating, accompanied by bloating.  CT ab done.  Pancreatic mass suggested  .  Stools are occurring daily have alternated between loose and firm  sometimes with mucous ,  has fecal urgency every morning , then fine the rest of the day . Takes a handful of [place] vitamins every morning.  Plus 3 Hair skin & Nails . Has not tried suspending as a tiral to see if stool consistently improves.      Outpatient Medications Prior to Visit  Medication Sig Dispense Refill   acetaminophen (TYLENOL) 500 MG tablet Take by mouth.     blood glucose meter kit and supplies Dispense based on patient and insurance preference. Use up to four times daily as directed. (FOR ICD-10 E10.9, E11.9). 1 each 0   cetirizine (ZYRTEC) 10 MG tablet Take 10 mg by mouth daily.     diclofenac Sodium (VOLTAREN) 1 % GEL Apply topically 4 (four) times daily.     esomeprazole (NEXIUM) 40 MG capsule TAKE 1 CAPSULE BY MOUTH DAILY BEFORE BREAKFAST 90 capsule 1   estradiol (ESTRACE) 0.1 MG/GM vaginal cream INSERT 1 APPLICATORFUL VAGINALLY 3 TIMES A WEEK 42.5 g 12    fluticasone (FLONASE) 50 MCG/ACT nasal spray SHAKE LIQUID AND USE 2 SPRAYS IN EACH NOSTRIL AT BEDTIME AS NEEDED 16 g 5   metroNIDAZOLE (METROGEL) 1 % gel Apply topically daily. 45 g 0   Multiple Vitamin (MULTIVITAMIN) tablet Take 1 tablet by mouth daily.     rosuvastatin (CRESTOR) 20 MG tablet TAKE 1 TABLET(20 MG) BY MOUTH DAILY 90 tablet 1   valACYclovir (VALTREX) 1000 MG tablet Take 1 tablet (1,000 mg total) by mouth 3 (three) times daily. KEEP ON FILE FOR FUTURE REFILLS 21 tablet 1   VENTOLIN HFA 108 (90 Base) MCG/ACT inhaler Inhale 2 puffs into the lungs every 6 (six) hours as needed. 1 each 1   minoxidil (MINOXIDIL FOR WOMEN) 2 % external solution Apply topically 2 (two) times daily. (Patient not taking: Reported on 04/14/2023) 60 mL 0   No facility-administered medications prior to visit.    Review of Systems;  Patient denies headache, fevers, malaise, unintentional weight loss, skin rash, eye pain, sinus congestion and sinus pain, sore throat, dysphagia,  hemoptysis , cough, dyspnea, wheezing, chest pain, palpitations, orthopnea, edema, abdominal pain, nausea, melena, diarrhea, constipation, flank pain, dysuria, hematuria, urinary  Frequency, nocturia, numbness, tingling, seizures,  Focal weakness, Loss of consciousness,  Tremor, insomnia, depression, anxiety, and suicidal ideation.  Objective:  BP 126/80   Pulse 84   Ht 5\' 4"  (1.626 m)   Wt 177 lb 12.8 oz (80.6 kg)   SpO2 96%   BMI 30.52 kg/m   BP Readings from Last 3 Encounters:  04/14/23 126/80  03/10/23 132/66  01/16/23 120/88    Wt Readings from Last 3 Encounters:  04/14/23 177 lb 12.8 oz (80.6 kg)  03/10/23 181 lb 9.6 oz (82.4 kg)  11/14/22 180 lb 9.6 oz (81.9 kg)    Physical Exam Vitals reviewed.  Constitutional:      General: She is not in acute distress.    Appearance: Normal appearance. She is normal weight. She is not ill-appearing, toxic-appearing or diaphoretic.  HENT:     Head: Normocephalic.  Eyes:      General: No scleral icterus.       Right eye: No discharge.        Left eye: No discharge.     Conjunctiva/sclera: Conjunctivae normal.  Cardiovascular:     Rate and Rhythm: Normal rate and regular rhythm.     Heart sounds: Normal heart sounds.  Pulmonary:     Effort: Pulmonary effort is normal. No respiratory distress.     Breath sounds: Normal breath sounds.  Musculoskeletal:        General: Normal range of motion.  Skin:    General: Skin is warm and dry.  Neurological:     General: No focal deficit present.     Mental Status: She is alert and oriented to person, place, and time. Mental status is at baseline.  Psychiatric:        Mood and Affect: Mood normal.        Behavior: Behavior normal.        Thought Content: Thought content normal.        Judgment: Judgment normal.   Lab Results  Component Value Date   HGBA1C 7.4 (H) 02/09/2023   HGBA1C 7.2 (H) 11/17/2022   HGBA1C 6.6 (H) 04/11/2022    Lab Results  Component Value Date   CREATININE 0.70 04/06/2023   CREATININE 0.76 02/09/2023   CREATININE 0.74 11/17/2022    Lab Results  Component Value Date   WBC 4.3 11/17/2022   HGB 13.8 11/17/2022   HCT 41.8 11/17/2022   PLT 209.0 11/17/2022   GLUCOSE 130 (H) 02/09/2023   CHOL 136 11/17/2022   TRIG 125.0 11/17/2022   HDL 64.10 11/17/2022   LDLDIRECT 53.0 11/17/2022   LDLCALC 47 11/17/2022   ALT 13 02/09/2023   AST 14 02/09/2023   NA 137 02/09/2023   K 4.0 02/09/2023   CL 105 02/09/2023   CREATININE 0.70 04/06/2023   BUN 9 02/09/2023   CO2 25 02/09/2023   TSH 3.94 11/17/2022   HGBA1C 7.4 (H) 02/09/2023   MICROALBUR <0.7 03/10/2023    CT ABDOMEN PELVIS W WO CONTRAST Result Date: 04/09/2023 CLINICAL DATA:  Suprapubic pain, intermittent diarrhea history of cholecystectomy and hysterectomy EXAM: CT ABDOMEN AND PELVIS WITHOUT AND WITH CONTRAST TECHNIQUE: Multidetector CT imaging of the abdomen and pelvis was performed following the standard protocol before and  following the bolus administration of intravenous contrast. RADIATION DOSE REDUCTION: This exam was performed according to the departmental dose-optimization program which includes automated exposure control, adjustment of the mA and/or kV according to patient size and/or use of iterative reconstruction technique. CONTRAST:  OMNIPAQUE IOHEXOL 300 MG/ML  SOLN COMPARISON:  Abdominal ultrasound June 26, 2018 and CT abdomen and pelvis May 30, 2014 FINDINGS:  Lower chest: Choose 1 with chronic lobular nodular basilar atelectasis. Hepatobiliary: No focal liver abnormality is seen. Status post cholecystectomy. No biliary dilatation. Pancreas: Pancreas appears prominent within the proximal portion of the body of the pancreas with an inhomogeneous soft tissue density that measures about 3 x 2 cm in maximum transverse and AP diameter. On the coronal images there is suggestion of a possible soft tissue mass that measures 1.6 x 1.4 cm. We will like to recommend follow-up MRI of the pancreas with and without contrast. Spleen: Normal in size without focal abnormality. Adrenals/Urinary Tract: Adrenal glands are unremarkable. Kidneys are normal, without renal calculi, focal lesion, or hydronephrosis. Bladder is unremarkable. Simple cortical cyst medial cortical region lower pole left kidney 1.6 x 1.4 cm. Following the intravenous administration of contrast material there is prompt bilateral symmetric nephrograms with symmetric excretion of contrast in the pelvicalyceal system and ureters. There is no persistent intraluminal filling defects or evidence of papillary necrosis. Both ureters are normal size and trajectory. The bladder appears grossly unremarkable without evidence of masses. Stomach/Bowel: Stomach is within normal limits. Appendix appears normal. No evidence of bowel wall thickening, distention, or inflammatory changes. Diffuse diverticulosis sigmoid colon without diverticulitis Vascular/Lymphatic: No significant  vascular findings are present. No enlarged abdominal or pelvic lymph nodes. Mild arteriosclerosis of proximal abdominal aorta Reproductive: Status post hysterectomy. No adnexal masses. Other: Anterior abdominal wall umbilical hernia containing fat only. Musculoskeletal: No fracture is seen. IMPRESSION: *Pancreas appears prominent within the proximal portion of the body of the pancreas with an inhomogeneous soft tissue density that measures about 3 x 2 cm in maximum transverse and AP diameter. On the coronal images there is suggestion of a possible soft tissue mass that measures 1.6 x 1.4 cm. We will like to recommend follow-up MRI of the pancreas with and without contrast. *Diffuse diverticulosis sigmoid colon without diverticulitis. *Anterior abdominal wall umbilical hernia containing fat only. *Status post cholecystectomy and hysterectomy. *Simple cortical cyst lower pole left kidney. *Aortic atherosclerosis. Electronically Signed   By: Shaaron Adler M.D.   On: 04/09/2023 16:15    Assessment & Plan:  .Umbilical hernia without obstruction or gangrene -     Ambulatory referral to General Surgery  Type 2 diabetes mellitus with obesity (HCC) Assessment & Plan: GLP1 contraindicated until pancreatic mass is rule out.  Starting [place] .  CONTINUE STATIN   Lab Results  Component Value Date   HGBA1C 7.4 (H) 02/09/2023   Lab Results  Component Value Date   MICROALBUR <0.7 03/10/2023   MICROALBUR <0.2 04/11/2022        Hyperlipidemia LDL goal <70 Assessment & Plan: LDL is at goal on atorvastatin . No changes today  Lab Results  Component Value Date   CHOL 136 11/17/2022   HDL 64.10 11/17/2022   LDLCALC 47 11/17/2022   LDLDIRECT 53.0 11/17/2022   TRIG 125.0 11/17/2022   CHOLHDL 2 11/17/2022   Lab Results  Component Value Date   ALT 13 02/09/2023   AST 14 02/09/2023   ALKPHOS 48 02/09/2023   BILITOT 0.5 02/09/2023      Pancreatic mass Assessment & Plan: Noted on CT of abdomen  and pelvis done to evaluate c/o abd pain which is still present .  MRI abdomen ordered   Other orders -     SITagliptin Phosphate; Take 1 tablet (25 mg total) by mouth daily.  Dispense: 90 tablet; Refill: 1     I spent  42 minutes on the day of this face  to face encounter reviewing patient's prior relevant surgical and non surgical procedures, recent  labs and imaging studies, counseling on diabetes and weight management,  reviewing the assessment and plan with patient, and post visit ordering and reviewing of  diagnostics and therapeutics with patient  .   Follow-up: Return in about 2 months (around 06/14/2023) for follow up diabetes.   Sherlene Shams, MD

## 2023-04-14 NOTE — Patient Instructions (Signed)
 Starting you on januvia to help lower your blood sugars  Starting dose is 25 mg once daily,  with or without food  You may increase the dose to 50 mg daily after one month (90 day supply sent so let me know if you have increased the dose after 30 days)  Sending you to Dr Hazle Quant for a look at your umbilical hernia   [facility] will be calling you to set up the MRI of your pancreas

## 2023-04-17 ENCOUNTER — Ambulatory Visit
Admission: RE | Admit: 2023-04-17 | Discharge: 2023-04-17 | Disposition: A | Discharge status: Home / Self Care | Source: Ambulatory Visit | Attending: Internal Medicine | Admitting: Internal Medicine

## 2023-04-17 DIAGNOSIS — R1900 Intra-abdominal and pelvic swelling, mass and lump, unspecified site: Secondary | ICD-10-CM | POA: Insufficient documentation

## 2023-04-17 DIAGNOSIS — Z9049 Acquired absence of other specified parts of digestive tract: Secondary | ICD-10-CM | POA: Diagnosis not present

## 2023-04-17 MED ORDER — GADOBUTROL 1 MMOL/ML IV SOLN
7.5000 mL | Freq: Once | INTRAVENOUS | Status: AC | PRN
  Administered 2023-04-17: 7.5 mL via INTRAVENOUS

## 2023-04-19 ENCOUNTER — Encounter: Payer: Self-pay | Admitting: Internal Medicine

## 2023-04-19 NOTE — Addendum Note (Signed)
 Addended by: Thersia Flax on: 04/19/2023 09:11 PM   Modules accepted: Orders

## 2023-04-19 NOTE — Addendum Note (Signed)
 Addended by: Thersia Flax on: 04/19/2023 09:15 PM   Modules accepted: Orders

## 2023-04-19 NOTE — Assessment & Plan Note (Signed)
 Seen on abd/pelvic CT done because of RUQ pain.  Confirmed mass by MRI abd with adeno CA suggested and splenic vein involved.  Urgent oncology referral IN PROGRESS

## 2023-04-20 ENCOUNTER — Telehealth: Payer: Self-pay

## 2023-04-20 ENCOUNTER — Other Ambulatory Visit: Payer: Self-pay

## 2023-04-20 DIAGNOSIS — K8689 Other specified diseases of pancreas: Secondary | ICD-10-CM

## 2023-04-20 NOTE — Telephone Encounter (Signed)
 Spoke with Sheryl to let her know that we have received the MRI results. She stated that she would let Tiffany know.

## 2023-04-20 NOTE — Telephone Encounter (Signed)
 Copied from CRM [id number]. Topic: Clinical - Lab/Test Results >> Apr 20, 2023  9:03 AM Juluis Ok wrote: Reason for CRM: Tiffany w/ [facility] calling to relay MRI results. Please contact to confirm results were received.  Callback # [phone]

## 2023-04-21 ENCOUNTER — Other Ambulatory Visit: Payer: Self-pay

## 2023-04-21 ENCOUNTER — Inpatient Hospital Stay: Attending: Oncology | Admitting: Oncology

## 2023-04-21 ENCOUNTER — Inpatient Hospital Stay

## 2023-04-21 ENCOUNTER — Telehealth: Payer: Self-pay

## 2023-04-21 ENCOUNTER — Encounter: Payer: Self-pay | Admitting: Oncology

## 2023-04-21 VITALS — BP 131/74 | HR 82 | Temp 98.0°F | Resp 19 | Ht 64.0 in | Wt 177.1 lb

## 2023-04-21 DIAGNOSIS — E1169 Type 2 diabetes mellitus with other specified complication: Secondary | ICD-10-CM | POA: Diagnosis not present

## 2023-04-21 DIAGNOSIS — E669 Obesity, unspecified: Secondary | ICD-10-CM | POA: Insufficient documentation

## 2023-04-21 DIAGNOSIS — C252 Malignant neoplasm of tail of pancreas: Secondary | ICD-10-CM | POA: Insufficient documentation

## 2023-04-21 DIAGNOSIS — K8689 Other specified diseases of pancreas: Secondary | ICD-10-CM

## 2023-04-21 DIAGNOSIS — K869 Disease of pancreas, unspecified: Secondary | ICD-10-CM | POA: Diagnosis not present

## 2023-04-21 LAB — CMP (CANCER CENTER ONLY)
ALT: 17 U/L (ref 0–44)
AST: 18 U/L (ref 15–41)
Albumin: 4.4 g/dL (ref 3.5–5.0)
Alkaline Phosphatase: 59 U/L (ref 38–126)
Anion gap: 9 (ref 5–15)
BUN: 11 mg/dL (ref 8–23)
CO2: 26 mmol/L (ref 22–32)
Calcium: 9.8 mg/dL (ref 8.9–10.3)
Chloride: 103 mmol/L (ref 98–111)
Creatinine: 0.61 mg/dL (ref 0.44–1.00)
GFR, Estimated: >60 mL/min (ref >60)
Glucose, Bld: 143 mg/dL — ABNORMAL HIGH (ref 70–99)
Potassium: 4.6 mmol/L (ref 3.5–5.1)
Sodium: 138 mmol/L (ref 135–145)
Total Bilirubin: 0.6 mg/dL (ref 0.0–1.2)
Total Protein: 7.8 g/dL (ref 6.5–8.1)

## 2023-04-21 LAB — CBC WITH DIFFERENTIAL (CANCER CENTER ONLY)
Abs Immature Granulocytes: 0.02 10*3/uL (ref 0.00–0.07)
Basophils Absolute: 0 10*3/uL (ref 0.0–0.1)
Basophils Relative: 1 %
Eosinophils Absolute: 0.1 10*3/uL (ref 0.0–0.5)
Eosinophils Relative: 2 %
HCT: 45.7 % (ref 36.0–46.0)
Hemoglobin: 14.9 g/dL (ref 12.0–15.0)
Immature Granulocytes: 0 %
Lymphocytes Relative: 45 %
Lymphs Abs: 2.8 10*3/uL (ref 0.7–4.0)
MCH: 28.7 pg (ref 26.0–34.0)
MCHC: 32.6 g/dL (ref 30.0–36.0)
MCV: 87.9 fL (ref 80.0–100.0)
Monocytes Absolute: 0.4 10*3/uL (ref 0.1–1.0)
Monocytes Relative: 6 %
Neutro Abs: 2.8 10*3/uL (ref 1.7–7.7)
Neutrophils Relative %: 46 %
Platelet Count: 195 10*3/uL (ref 150–400)
RBC: 5.2 MIL/uL — ABNORMAL HIGH (ref 3.87–5.11)
RDW: 12.5 % (ref 11.5–15.5)
WBC Count: 6.1 10*3/uL (ref 4.0–10.5)
nRBC: 0 % (ref 0.0–0.2)

## 2023-04-21 NOTE — Telephone Encounter (Signed)
 Copied from CRM [id number]. Topic: Appointments - Appointment Info/Confirmation >> Apr 21, 2023 10:30 AM Howard Macho wrote: Patient/patient representative is calling for information regarding an appointment.   Patient called stating she would like to be called concerning a procedure that she is going to have on 4/17 at oncology.  Patient stated its suppose to be with Dr. Randy Buttery but they have her scheduled with Dellar Fenton
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
## 2023-04-21 NOTE — Progress Notes (Unsigned)
 Introduced Visual merchandiser and provided contact information for future needs. Educated further on EUS and PET scan and provided printed instructions. Encouraged to call with any needs.
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
## 2023-04-22 ENCOUNTER — Encounter: Payer: Self-pay | Admitting: Oncology

## 2023-04-22 ENCOUNTER — Encounter: Payer: Self-pay | Admitting: Internal Medicine

## 2023-04-22 ENCOUNTER — Other Ambulatory Visit: Payer: Self-pay | Admitting: Internal Medicine

## 2023-04-22 NOTE — Telephone Encounter (Signed)
 Pt received a libre sensor on 04/15/2023 it has now fallen off. Would you like for pt to have another placed on? If so do we need to send in a rx or give her a sample if we have one available?

## 2023-04-23 ENCOUNTER — Ambulatory Visit
Admission: RE | Admit: 2023-04-23 | Discharge: 2023-04-23 | Disposition: A | Discharge status: Home / Self Care | Attending: Internal Medicine | Admitting: Internal Medicine

## 2023-04-23 ENCOUNTER — Encounter: Payer: Self-pay | Admitting: Internal Medicine

## 2023-04-23 ENCOUNTER — Ambulatory Visit: Admitting: Certified Registered"

## 2023-04-23 ENCOUNTER — Encounter
Admission: RE | Disposition: A | Discharge status: Home / Self Care | Payer: Self-pay | Source: Home / Self Care | Attending: Internal Medicine

## 2023-04-23 DIAGNOSIS — Z79899 Other long term (current) drug therapy: Secondary | ICD-10-CM | POA: Diagnosis not present

## 2023-04-23 DIAGNOSIS — Z7984 Long term (current) use of oral hypoglycemic drugs: Secondary | ICD-10-CM | POA: Insufficient documentation

## 2023-04-23 DIAGNOSIS — C252 Malignant neoplasm of tail of pancreas: Secondary | ICD-10-CM | POA: Insufficient documentation

## 2023-04-23 DIAGNOSIS — E119 Type 2 diabetes mellitus without complications: Secondary | ICD-10-CM | POA: Insufficient documentation

## 2023-04-23 DIAGNOSIS — K219 Gastro-esophageal reflux disease without esophagitis: Secondary | ICD-10-CM | POA: Insufficient documentation

## 2023-04-23 DIAGNOSIS — K869 Disease of pancreas, unspecified: Secondary | ICD-10-CM | POA: Diagnosis not present

## 2023-04-23 DIAGNOSIS — R933 Abnormal findings on diagnostic imaging of other parts of digestive tract: Secondary | ICD-10-CM | POA: Diagnosis present

## 2023-04-23 HISTORY — PX: EUS: SHX5427

## 2023-04-23 HISTORY — PX: FINE NEEDLE ASPIRATION BIOPSY: CATH118315

## 2023-04-23 LAB — CANCER ANTIGEN 19-9: CA 19-9: 355 U/mL — ABNORMAL HIGH (ref 0–35)

## 2023-04-23 SURGERY — ULTRASOUND, UPPER GI TRACT, ENDOSCOPIC
Anesthesia: General

## 2023-04-23 MED ORDER — SODIUM CHLORIDE 0.9 % IV SOLN: INTRAVENOUS | Status: DC

## 2023-04-23 MED ORDER — PROPOFOL 500 MG/50ML IV EMUL
INTRAVENOUS | Status: DC | PRN
  Administered 2023-04-23: 150 ug/kg/min via INTRAVENOUS

## 2023-04-23 MED ORDER — PROPOFOL 10 MG/ML IV BOLUS
INTRAVENOUS | Status: DC | PRN
  Administered 2023-04-23: 50 mg via INTRAVENOUS
  Administered 2023-04-23: 30 mg via INTRAVENOUS

## 2023-04-23 NOTE — Anesthesia Postprocedure Evaluation (Signed)
 Anesthesia Post Note  Patient: Kathleen Russell  Procedure(s) Performed: ULTRASOUND, UPPER GI TRACT, ENDOSCOPIC FINE NEEDLE ASPIRATION BIOPSY  Patient location during evaluation: PACU Anesthesia Type: General Level of consciousness: awake and awake and alert Pain management: satisfactory to patient Vital Signs Assessment: post-procedure vital signs reviewed and stable Cardiovascular status: stable Anesthetic complications: no   No notable events documented.   Last Vitals:  Vitals:   04/23/23 1230 04/23/23 1324  BP: (!) 142/86 113/68  Pulse: 94 82  Resp: 19 19  Temp: 36.6 C (!) 36 C  SpO2: 100% 97%    Last Pain:  Vitals:   04/23/23 1324  TempSrc: Temporal  PainSc: Asleep                 VAN STAVEREN,Rheana Casebolt

## 2023-04-23 NOTE — Discharge Instructions (Signed)
 Discharge to home

## 2023-04-23 NOTE — Transfer of Care (Signed)
 Immediate Anesthesia Transfer of Care Note  Patient: Kathleen Russell  Procedure(s) Performed: ULTRASOUND, UPPER GI TRACT, ENDOSCOPIC FINE NEEDLE ASPIRATION BIOPSY  Patient Location: PACU  Anesthesia Type:General  Level of Consciousness: drowsy  Airway & Oxygen Therapy: Patient Spontanous Breathing  Post-op Assessment: Report given to RN, Post -op Vital signs reviewed and stable, and Patient moving all extremities X 4  Post vital signs: Reviewed and stable  Last Vitals:  Vitals Value Taken Time  BP 113/68   Temp    Pulse 84   Resp 12   SpO2 95     Last Pain:  Vitals:   04/23/23 1230  TempSrc: Temporal  PainSc: 0-No pain         Complications: No notable events documented.

## 2023-04-23 NOTE — Op Note (Signed)
 [facility] Gastroenterology Patient Name: Kathleen Russell Procedure Date: 04/23/2023 12:36 PM MRN: [id number] Account #: [id number] Date of Birth: [birth year] Admit Type: Outpatient Age: 68 Room: [facility] ENDO ROOM 3 Gender: Female Note Status: Finalized Instrument Name: Upper Endoscope [id number],Linear EUS Scope [id number] Procedure:             Upper EUS Indications:           Suspected mass in pancreas on CT scan Patient Profile:       Refer to note in patient chart for documentation of                         history and physical. Providers:             Prudencio Pair. Oreatha Fabry Referring MD:          Duncan Dull, MD (Referring MD) Medicines:             Propofol per Anesthesia Complications:         No immediate complications. Procedure:             Pre-Anesthesia Assessment:                        Prior to the procedure, a History and Physical was                         performed, and patient medications and allergies were                         reviewed. The patient is competent. The risks and                         benefits of the procedure and the sedation options and                         risks were discussed with the patient. All questions                         were answered and informed consent was obtained.                         Patient identification and proposed procedure were                         verified by the physician, the nurse and the                         anesthesiologist in the pre-procedure area. Mental                         Status Examination: alert and oriented. Airway                         Examination: normal oropharyngeal airway and neck                         mobility. Respiratory Examination: clear to                         auscultation. CV Examination: normal. Prophylactic  Antibiotics: The patient does not require prophylactic                         antibiotics. Prior Anticoagulants: The patient has                          taken no anticoagulant or antiplatelet agents. ASA                         Grade Assessment: II - A patient with mild systemic                         disease. After reviewing the risks and benefits, the                         patient was deemed in satisfactory condition to                         undergo the procedure. The anesthesia plan was to use                         monitored anesthesia care (MAC). Immediately prior to                         administration of medications, the patient was                         re-assessed for adequacy to receive sedatives. The                         heart rate, respiratory rate, oxygen saturations,                         blood pressure, adequacy of pulmonary ventilation, and                         response to care were monitored throughout the                         procedure. The physical status of the patient was                         re-assessed after the procedure.                        After obtaining informed consent, the endoscope was                         passed under direct vision. Throughout the procedure,                         the patient's blood pressure, pulse, and oxygen                         saturations were monitored continuously. The Endoscope                         was introduced through the mouth, and advanced to the  second part of duodenum. The Endoscope was introduced                         through the mouth, and advanced to the duodenum for                         ultrasound examination from the esophagus, stomach and                         duodenum. The upper EUS was accomplished without                         difficulty. The patient tolerated the procedure well. Findings:      ENDOSCOPIC FINDING: :      The examined esophagus was endoscopically normal.      The entire examined stomach was endoscopically normal.      The examined duodenum was endoscopically  normal.      ENDOSONOGRAPHIC FINDING: :      An irregular mass was identified in the pancreatic tail. The mass was       hypoechoic. The mass measured 27 mm by 26mm in maximal cross-sectional       diameter. The endosonographic borders were poorly-defined. The remainder       of the pancreas was examined. The endosonographic appearance of       parenchyma and the upstream pancreatic duct indicated that the remainder       of the pancreas was unremarkable. The PD measured up to 1.6 mm in the       head. Fine needle biopsy was performed. Color Doppler imaging was       utilized prior to needle puncture to confirm a lack of significant       vascular structures within the needle path. Three passes were made with       the 25 gauge Medtronic SharkCore biopsy needle using a transgastric       approach. The cellularity of the specimen was adequate. Final cytology       results are pending.      There was no sign of significant endosonographic abnormality in the       common bile duct and in the common hepatic duct. The bile duct measured       up to 5 mm.      Endosonographic imaging in the left lobe of the liver showed no       abnormalities.      No lymphadenopathy seen.      The region of the celiac plexus and celiac ganglia was visualized and       showed no sign of significant endosonographic abnormality. Impression:            EGD Impressions:                        - Normal esophagus.                        - Normal stomach.                        - Normal examined duodenum.                        EUS Impressions:                        -  A mass was identified in the pancreatic tail.                         Cytology results are pending. However, the                         endosonographic appearance is highly suspicious for                         adenocarcinoma. Fine needle biopsy performed.                        - Otherwise normal appearing pancreas.                        - There  was no sign of significant pathology in the                         common bile duct and in the common hepatic duct.                        - Normal visualized portions of the liver.                        - Normal celiac region.                        - No lymphadenopathy seen. Recommendation:        - Discharge patient to home (ambulatory).                        - Await cytology results.                        - The findings and recommendations were discussed with                         the patient.                        - Return to referring physician as previously                         scheduled. Further plan of care to be determined by                         the referring providers. Procedure Code(s):     --- Professional ---                        [phone], Esophagogastroduodenoscopy, flexible,                         transoral; with transendoscopic ultrasound-guided                         intramural or transmural fine needle                         aspiration/biopsy(s) (includes endoscopic ultrasound  examination of the esophagus, stomach, and either the                         duodenum or a surgically altered stomach where the                         jejunum is examined distal to the anastomosis) Diagnosis Code(s):     --- Professional ---                        R93.3, Abnormal findings on diagnostic imaging of                         other parts of digestive tract                        K86.89, Other specified diseases of pancreas CPT copyright 2022 American Medical Association. All rights reserved. The codes documented in this report are preliminary and upon coder review may  be revised to meet current compliance requirements. Attending Participation:      I personally performed the entire procedure without the assistance of a       fellow, resident or surgical assistant. Georges Kings Neisha Hinger,  04/23/2023 1:29:12 PM This report has been signed  electronically. Number of Addenda: 0 Note Initiated On: 04/23/2023 12:36 PM Estimated Blood Loss:  Estimated blood loss: none.      [facility]
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
## 2023-04-23 NOTE — Anesthesia Procedure Notes (Signed)
 Procedure Name: MAC Date/Time: 04/23/2023 12:55 PM  Performed by: Ulanda Gambles, CRNAPre-anesthesia Checklist: Emergency Drugs available, Patient identified, Suction available and Patient being monitored Oxygen Delivery Method: Simple face mask

## 2023-04-23 NOTE — Interval H&P Note (Signed)
 History and Physical Interval Note:  04/23/2023 1:34 PM  Kathleen Russell  has presented today for surgery, with the diagnosis of Pancreas mass.  The various methods of treatment have been discussed with the patient and family. After consideration of risks, benefits and other options for treatment, the patient has consented to  Procedure(s): ULTRASOUND, UPPER GI TRACT, ENDOSCOPIC (N/A) FINE NEEDLE ASPIRATION BIOPSY as a surgical intervention.  The patient's history has been reviewed, patient examined, no change in status, stable for surgery.  I have reviewed the patient's chart and labs.  Questions were answered to the patient's satisfaction.     Dellar Fenton

## 2023-04-27 ENCOUNTER — Ambulatory Visit

## 2023-04-28 ENCOUNTER — Telehealth: Payer: Self-pay | Admitting: *Deleted

## 2023-04-28 LAB — CYTOLOGY - NON PAP

## 2023-04-28 NOTE — Telephone Encounter (Signed)
 Per Bridgette Campus in pt scheduling. Call received from [facility]. [facility] called in regards to the referral they received from Dr. Randy Buttery.  [facility] needs auth faxed (CPT [id number]) NPI [id number] Fax [phone]  Phone [phone]  Dr. Wilbern Hancock (Surg [facility])   Per Tyree Galli- [employer] is out of network with [facility]. Pt referred to [facility] for pancreas mass. path is still pending to confirm adenocarcinoma   Per Lovette Rud, RN [facility] documented on the incoming referral status - "Ref# [id number] , SURGERY-ONCOLOGY : Called the referring office and spoke to [place]. Discussed the Out of Network plan. The referring office stated they will pursue an In Network authorization from the insurance plan due to unique/specialized service. If we receive an authorization, [facility] will validate the auth and clear for scheduling. Patient is NOT cleared for scheduling at this time"  Per Bennet Brasil - "spoke w [facility] and there is a form that will have to be completed for the request and faxed~ working on this now."

## 2023-04-28 NOTE — Telephone Encounter (Signed)
 Per Craige Dixon- form completed, faxed w clinicals, requested as URGENT

## 2023-04-29 ENCOUNTER — Encounter: Payer: Self-pay | Admitting: Oncology

## 2023-04-29 ENCOUNTER — Other Ambulatory Visit: Payer: Self-pay

## 2023-04-29 ENCOUNTER — Inpatient Hospital Stay: Admitting: Oncology

## 2023-04-29 VITALS — BP 128/74 | HR 87 | Temp 97.5°F | Resp 18 | Ht 64.0 in | Wt 176.0 lb

## 2023-04-29 DIAGNOSIS — Z7189 Other specified counseling: Secondary | ICD-10-CM

## 2023-04-29 DIAGNOSIS — C259 Malignant neoplasm of pancreas, unspecified: Secondary | ICD-10-CM

## 2023-04-29 DIAGNOSIS — C252 Malignant neoplasm of tail of pancreas: Secondary | ICD-10-CM

## 2023-05-01 ENCOUNTER — Encounter: Payer: Self-pay | Admitting: Oncology

## 2023-05-01 ENCOUNTER — Telehealth: Payer: Self-pay

## 2023-05-01 ENCOUNTER — Other Ambulatory Visit: Payer: Self-pay

## 2023-05-01 DIAGNOSIS — C259 Malignant neoplasm of pancreas, unspecified: Secondary | ICD-10-CM

## 2023-05-01 DIAGNOSIS — C252 Malignant neoplasm of tail of pancreas: Secondary | ICD-10-CM | POA: Insufficient documentation

## 2023-05-01 MED ORDER — ONDANSETRON HCL 8 MG PO TABS: 8.0000 mg | ORAL_TABLET | Freq: Three times a day (TID) | ORAL | 1 refills | Status: DC | PRN

## 2023-05-01 MED ORDER — LOPERAMIDE HCL 2 MG PO CAPS: ORAL_CAPSULE | ORAL | 3 refills | Status: DC

## 2023-05-01 MED ORDER — PROCHLORPERAZINE MALEATE 10 MG PO TABS: 10.0000 mg | ORAL_TABLET | Freq: Four times a day (QID) | ORAL | 1 refills | Status: DC | PRN

## 2023-05-01 MED ORDER — DEXAMETHASONE 4 MG PO TABS: 8.0000 mg | ORAL_TABLET | Freq: Every day | ORAL | 1 refills | Status: DC

## 2023-05-01 MED ORDER — LIDOCAINE-PRILOCAINE 2.5-2.5 % EX CREA: TOPICAL_CREAM | CUTANEOUS | 3 refills | Status: DC

## 2023-05-01 NOTE — Telephone Encounter (Signed)
 Spoke to Mrs. Lott. Port has been scheduled.  Port a cath placement scheduled for May 06, 2023  Arrive at 1000  for 1100 appointment Come into the Heart and Vascular entrance at [facility]. This entrance is located in the front of the hospital. Do not eat or drink anything after midnight Take your regularly scheduled blood pressure, pain, or seizure medication. You do not need to hold you blood thinners. You will need a driver for this procedure.   She has also been approved for ONE visit Auth# [id number] valid 04/28/23-07/27/23 for Dr. Wheeler Hammonds at [facility].

## 2023-05-01 NOTE — Progress Notes (Signed)
 START ON PATHWAY REGIMEN - Pancreatic Adenocarcinoma     A cycle is every 14 days:     Irinotecan      Oxaliplatin      Leucovorin      Fluorouracil   **Always confirm dose/schedule in your pharmacy ordering system**  Patient Characteristics: Preoperative, M0 (Clinical Staging), Borderline Resectable, PS = 0,1, BRCA1/2 and PALB2 Mutation Absent/Unknown Therapeutic Status: Preoperative, M0 (Clinical Staging) AJCC T Category: cT3 AJCC N Category: cN0 Resectability Status: Borderline Resectable AJCC M Category: cM0 AJCC 8 Stage Grouping: IIA ECOG Performance Status: 1 BRCA1/2 Mutation Status: Awaiting Test Results PALB2 Mutation Status: Awaiting Test Results Intent of Therapy: Curative Intent, Discussed with Patient

## 2023-05-03 ENCOUNTER — Other Ambulatory Visit: Payer: Self-pay

## 2023-05-04 ENCOUNTER — Ambulatory Visit
Admission: RE | Admit: 2023-05-04 | Discharge: 2023-05-04 | Disposition: A | Discharge status: Home / Self Care | Source: Ambulatory Visit | Attending: Oncology | Admitting: Oncology

## 2023-05-04 ENCOUNTER — Encounter: Payer: Self-pay | Admitting: Oncology

## 2023-05-04 ENCOUNTER — Telehealth: Payer: Self-pay

## 2023-05-04 ENCOUNTER — Telehealth: Payer: Self-pay | Admitting: *Deleted

## 2023-05-04 DIAGNOSIS — Z7189 Other specified counseling: Secondary | ICD-10-CM | POA: Insufficient documentation

## 2023-05-04 DIAGNOSIS — K8689 Other specified diseases of pancreas: Secondary | ICD-10-CM
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
## 2023-05-04 NOTE — Telephone Encounter (Signed)
 That's ok.

## 2023-05-04 NOTE — Telephone Encounter (Signed)
Can you please look into this?

## 2023-05-04 NOTE — Telephone Encounter (Signed)
 Patient needs to get EMLA cream for port and treatment, They insurance has more questions to see if they can cover the med. Please call to [phone] with option 2

## 2023-05-04 NOTE — Telephone Encounter (Signed)
 Patient scheduled for PET today. She was notified was notified after arrival that the camera was down and she was rescheduled to May 6. Chemo to start 5/2.

## 2023-05-04 NOTE — Progress Notes (Signed)
 Pharmacist Chemotherapy Monitoring - Initial Assessment    Anticipated start date: 05/08/23   The following has been reviewed per standard work regarding the patient's treatment regimen: The patient's diagnosis, treatment plan and drug doses, and organ/hematologic function Lab orders and baseline tests specific to treatment regimen  The treatment plan start date, drug sequencing, and pre-medications Prior authorization status  Patient's documented medication list, including drug-drug interaction screen and prescriptions for anti-emetics and supportive care specific to the treatment regimen The drug concentrations, fluid compatibility, administration routes, and timing of the medications to be used The patient's access for treatment and lifetime cumulative dose history, if applicable  The patient's medication allergies and previous infusion related reactions, if applicable   Changes made to treatment plan:  N/A  Follow up needed:  N/A   Whitney Hillegass, Pharm.D., CPP 05/04/2023@4 :19 PM

## 2023-05-05 ENCOUNTER — Other Ambulatory Visit (HOSPITAL_COMMUNITY): Payer: Self-pay | Admitting: Radiology

## 2023-05-05 ENCOUNTER — Other Ambulatory Visit

## 2023-05-05 ENCOUNTER — Other Ambulatory Visit: Payer: Self-pay | Admitting: Radiology

## 2023-05-05 ENCOUNTER — Encounter: Payer: Self-pay | Admitting: Oncology

## 2023-05-05 NOTE — Telephone Encounter (Signed)
 Outbound call to patient; informed EMLA cream should be ready for pickup tomorrow 05/06/23 according to [facility].  Patient verbalized understanding.

## 2023-05-05 NOTE — Progress Notes (Signed)
 Patient for IR Port Insertion on Wed 05/06/23, I called and spoke with the patient on the phone and gave pre-procedure instructions. Pt was made aware to be here at 10a, NPO after MN prior to procedure as well as driver post procedure/recovery/discharge. Pt stated understanding.  Called 05/05/23

## 2023-05-05 NOTE — Telephone Encounter (Signed)
 PA submitted through Cover My Meds; Clinical Key: [id number] Approved.  Outbound call to [facility] in [place], [place] [phone]; spoke to pharmacy technician Takia.  Josephus Nida is able to see the approval on their end; it has not been filled yet but should be ready for pickup tomorrow 05/06/23.

## 2023-05-06 ENCOUNTER — Ambulatory Visit
Admission: RE | Admit: 2023-05-06 | Discharge: 2023-05-06 | Disposition: A | Discharge status: Home / Self Care | Source: Ambulatory Visit | Attending: Oncology | Admitting: Oncology

## 2023-05-06 ENCOUNTER — Encounter: Payer: Self-pay | Admitting: Radiology

## 2023-05-06 DIAGNOSIS — C259 Malignant neoplasm of pancreas, unspecified: Secondary | ICD-10-CM | POA: Insufficient documentation

## 2023-05-06 DIAGNOSIS — K219 Gastro-esophageal reflux disease without esophagitis: Secondary | ICD-10-CM | POA: Diagnosis not present

## 2023-05-06 DIAGNOSIS — E119 Type 2 diabetes mellitus without complications: Secondary | ICD-10-CM | POA: Diagnosis not present

## 2023-05-06 HISTORY — DX: Malignant neoplasm of pancreas, unspecified: C25.9

## 2023-05-06 HISTORY — PX: IR IMAGING GUIDED PORT INSERTION: IMG5740

## 2023-05-06 MED ORDER — HEPARIN SOD (PORK) LOCK FLUSH 100 UNIT/ML IV SOLN
INTRAVENOUS | Status: AC
  Filled 2023-05-06: qty 5

## 2023-05-06 MED ORDER — LIDOCAINE-EPINEPHRINE 1 %-1:100000 IJ SOLN
15.0000 mL | Freq: Once | INTRAMUSCULAR | Status: AC
  Administered 2023-05-06: 15 mL via INTRADERMAL

## 2023-05-06 MED ORDER — LIDOCAINE-EPINEPHRINE 1 %-1:100000 IJ SOLN
INTRAMUSCULAR | Status: AC
  Filled 2023-05-06: qty 1

## 2023-05-06 MED ORDER — SODIUM CHLORIDE 0.9 % IV SOLN
INTRAVENOUS | Status: DC
Start: 2023-05-06 — End: 2023-05-07

## 2023-05-06 MED ORDER — MIDAZOLAM HCL 2 MG/2ML IJ SOLN
INTRAMUSCULAR | Status: AC | PRN
  Administered 2023-05-06: .5 mg via INTRAVENOUS
  Administered 2023-05-06: 1 mg via INTRAVENOUS

## 2023-05-06 MED ORDER — FENTANYL CITRATE (PF) 100 MCG/2ML IJ SOLN
INTRAMUSCULAR | Status: AC | PRN
  Administered 2023-05-06: 50 ug via INTRAVENOUS
  Administered 2023-05-06: 25 ug via INTRAVENOUS

## 2023-05-06 MED ORDER — HEPARIN SOD (PORK) LOCK FLUSH 100 UNIT/ML IV SOLN
500.0000 [IU] | Freq: Once | INTRAVENOUS | Status: AC
  Administered 2023-05-06: 500 [IU] via INTRAVENOUS

## 2023-05-06 MED ORDER — MIDAZOLAM HCL 2 MG/2ML IJ SOLN
INTRAMUSCULAR | Status: AC
  Filled 2023-05-06: qty 2

## 2023-05-06 MED ORDER — FENTANYL CITRATE (PF) 100 MCG/2ML IJ SOLN
INTRAMUSCULAR | Status: AC
  Filled 2023-05-06: qty 2
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
## 2023-05-06 NOTE — Discharge Instructions (Addendum)
 Implanted [facility] Guide  An implanted port is a type of central line that is placed under the skin. Central lines are used to provide IV access when treatment or nutrition needs to be given through a person's veins. Implanted ports are used for long-term IV access. An implanted port may be placed because: You need IV medicine that would be irritating to the small veins in your hands or arms. You need long-term IV medicines, such as antibiotics. You need IV nutrition for a long period. You need frequent blood draws for lab tests. You need dialysis.   Implanted ports are usually placed in the chest area, but they can also be placed in the upper arm, the abdomen, or the leg. An implanted port has two main parts: Reservoir. The reservoir is round and will appear as a small, raised area under your skin. The reservoir is the part where a needle is inserted to give medicines or draw blood. Catheter. The catheter is a thin, flexible tube that extends from the reservoir. The catheter is placed into a large vein. Medicine that is inserted into the reservoir goes into the catheter and then into the vein.   How will I care for my incision  You may shower tomorrow Please remove dressing in 24hrs not other skin care is needed  How is my port accessed? Special steps must be taken to access the port: Before the port is accessed, a numbing cream can be placed on the skin. This helps numb the skin over the port site. Your health care provider uses a sterile technique to access the port. Your health care provider must put on a mask and sterile gloves. The skin over your port is cleaned carefully with an antiseptic and allowed to dry. The port is gently pinched between sterile gloves, and a needle is inserted into the port. Only "non-coring" port needles should be used to access the port. Once the port is accessed, a blood return should be checked. This helps ensure that the port is in the vein and is not  clogged. If your port needs to remain accessed for a constant infusion, a clear (transparent) bandage will be placed over the needle site. The bandage and needle will need to be changed every week, or as directed by your health care provider.   What is flushing? Flushing helps keep the port from getting clogged. Follow your health care provider's instructions on how and when to flush the port. Ports are usually flushed with saline solution or a medicine called heparin. The need for flushing will depend on how the port is used. If the port is used for intermittent medicines or blood draws, the port will need to be flushed: After medicines have been given. After blood has been drawn. As part of routine maintenance. If a constant infusion is running, the port may not need to be flushed.   How long will my port stay implanted? The port can stay in for as long as your health care provider thinks it is needed. When it is time for the port to come out, surgery will be done to remove it. The procedure is similar to the one performed when the port was put in. When should I seek immediate medical care? When you have an implanted port, you should seek immediate medical care if: You notice a bad smell coming from the incision site. You have swelling, redness, or drainage at the incision site. You have more swelling or pain at the  port site or the surrounding area. You have a fever that is not controlled with medicine.   This information is not intended to replace advice given to you by your health care provider. Make sure you discuss any questions you have with your health care provider. Document Released: 12/23/2004 Document Revised: 05/31/2015 Document Reviewed: 08/30/2012 Elsevier Interactive Patient Education  2017 [employer].

## 2023-05-06 NOTE — Procedures (Signed)
 Interventional Radiology Procedure Note  Procedure: Placement of a right IJ approach single lumen PowerPort (Bard ClearVue).  Tip is positioned at the superior cavoatrial junction and catheter is ready for immediate use.   Complications: No immediate  Recommendations:  - Ok to shower tomorrow - Do not submerge for 7 days - Routine line care   Signed,  Roxie Cord, MD

## 2023-05-07 ENCOUNTER — Inpatient Hospital Stay: Attending: Oncology

## 2023-05-07 ENCOUNTER — Inpatient Hospital Stay

## 2023-05-07 DIAGNOSIS — C252 Malignant neoplasm of tail of pancreas: Secondary | ICD-10-CM | POA: Insufficient documentation

## 2023-05-07 DIAGNOSIS — D6481 Anemia due to antineoplastic chemotherapy: Secondary | ICD-10-CM | POA: Insufficient documentation

## 2023-05-07 DIAGNOSIS — D701 Agranulocytosis secondary to cancer chemotherapy: Secondary | ICD-10-CM | POA: Insufficient documentation

## 2023-05-07 DIAGNOSIS — Z452 Encounter for adjustment and management of vascular access device: Secondary | ICD-10-CM | POA: Insufficient documentation

## 2023-05-07 DIAGNOSIS — K5903 Drug induced constipation: Secondary | ICD-10-CM | POA: Insufficient documentation

## 2023-05-07 DIAGNOSIS — Z5111 Encounter for antineoplastic chemotherapy: Secondary | ICD-10-CM | POA: Insufficient documentation

## 2023-05-07 MED FILL — Fosaprepitant Dimeglumine For IV Infusion 150 MG (Base Eq): INTRAVENOUS | Qty: 5 | Status: AC

## 2023-05-07 NOTE — Progress Notes (Signed)
 [facility] CSW Progress Note  Clinical Social Work introduced self to patient during Patient Education with Octavio Ben, Charity fundraiser.  Provided information regarding CSW role, including counseling, advanced care planning and support group.  Answered questions as needed.  Kennth Peal, LCSW Clinical Social Worker [facility]

## 2023-05-08 ENCOUNTER — Inpatient Hospital Stay: Attending: Oncology | Admitting: Oncology

## 2023-05-08 ENCOUNTER — Other Ambulatory Visit: Payer: Self-pay | Admitting: Oncology

## 2023-05-08 ENCOUNTER — Inpatient Hospital Stay

## 2023-05-08 ENCOUNTER — Encounter: Payer: Self-pay | Admitting: Oncology

## 2023-05-08 VITALS — BP 158/82 | HR 102 | Resp 18

## 2023-05-08 VITALS — BP 123/67 | HR 92 | Temp 97.5°F | Resp 18 | Ht 64.0 in | Wt 175.5 lb

## 2023-05-08 DIAGNOSIS — C252 Malignant neoplasm of tail of pancreas: Secondary | ICD-10-CM

## 2023-05-08 DIAGNOSIS — Z5111 Encounter for antineoplastic chemotherapy: Secondary | ICD-10-CM | POA: Diagnosis not present

## 2023-05-08 DIAGNOSIS — D701 Agranulocytosis secondary to cancer chemotherapy: Secondary | ICD-10-CM | POA: Diagnosis not present

## 2023-05-08 DIAGNOSIS — C259 Malignant neoplasm of pancreas, unspecified: Secondary | ICD-10-CM

## 2023-05-08 DIAGNOSIS — K8689 Other specified diseases of pancreas: Secondary | ICD-10-CM

## 2023-05-08 DIAGNOSIS — D6481 Anemia due to antineoplastic chemotherapy: Secondary | ICD-10-CM | POA: Diagnosis not present

## 2023-05-08 DIAGNOSIS — Z452 Encounter for adjustment and management of vascular access device: Secondary | ICD-10-CM | POA: Diagnosis not present

## 2023-05-08 DIAGNOSIS — K5903 Drug induced constipation: Secondary | ICD-10-CM | POA: Diagnosis not present

## 2023-05-08 LAB — CMP (CANCER CENTER ONLY)
ALT: 16 U/L (ref 0–44)
AST: 17 U/L (ref 15–41)
Albumin: 4 g/dL (ref 3.5–5.0)
Alkaline Phosphatase: 54 U/L (ref 38–126)
Anion gap: 9 (ref 5–15)
BUN: 17 mg/dL (ref 8–23)
CO2: 22 mmol/L (ref 22–32)
Calcium: 8.7 mg/dL — ABNORMAL LOW (ref 8.9–10.3)
Chloride: 105 mmol/L (ref 98–111)
Creatinine: 0.78 mg/dL (ref 0.44–1.00)
GFR, Estimated: >60 mL/min (ref >60)
Glucose, Bld: 161 mg/dL — ABNORMAL HIGH (ref 70–99)
Potassium: 3.9 mmol/L (ref 3.5–5.1)
Sodium: 136 mmol/L (ref 135–145)
Total Bilirubin: 0.4 mg/dL (ref 0.0–1.2)
Total Protein: 7 g/dL (ref 6.5–8.1)

## 2023-05-08 LAB — CBC WITH DIFFERENTIAL (CANCER CENTER ONLY)
Abs Immature Granulocytes: 0.02 10*3/uL (ref 0.00–0.07)
Basophils Absolute: 0 10*3/uL (ref 0.0–0.1)
Basophils Relative: 1 %
Eosinophils Absolute: 0.1 10*3/uL (ref 0.0–0.5)
Eosinophils Relative: 1 %
HCT: 41.4 % (ref 36.0–46.0)
Hemoglobin: 13.7 g/dL (ref 12.0–15.0)
Immature Granulocytes: 0 %
Lymphocytes Relative: 36 %
Lymphs Abs: 2.1 10*3/uL (ref 0.7–4.0)
MCH: 28.8 pg (ref 26.0–34.0)
MCHC: 33.1 g/dL (ref 30.0–36.0)
MCV: 87 fL (ref 80.0–100.0)
Monocytes Absolute: 0.4 10*3/uL (ref 0.1–1.0)
Monocytes Relative: 7 %
Neutro Abs: 3.3 10*3/uL (ref 1.7–7.7)
Neutrophils Relative %: 55 %
Platelet Count: 172 10*3/uL (ref 150–400)
RBC: 4.76 MIL/uL (ref 3.87–5.11)
RDW: 12.3 % (ref 11.5–15.5)
WBC Count: 6 10*3/uL (ref 4.0–10.5)
nRBC: 0 % (ref 0.0–0.2)

## 2023-05-08 LAB — GENETIC SCREENING ORDER

## 2023-05-08 MED ORDER — SODIUM CHLORIDE 0.9 % IV SOLN
2400.0000 mg/m2 | INTRAVENOUS | Status: AC
  Administered 2023-05-08: 5000 mg via INTRAVENOUS
  Filled 2023-05-08: qty 100

## 2023-05-08 MED ORDER — OXALIPLATIN CHEMO INJECTION 100 MG/20ML
65.0000 mg/m2 | Freq: Once | INTRAVENOUS | Status: AC
  Administered 2023-05-08: 125 mg via INTRAVENOUS
  Filled 2023-05-08: qty 5

## 2023-05-08 MED ORDER — FOSAPREPITANT DIMEGLUMINE INJECTION 150 MG
150.0000 mg | Freq: Once | INTRAVENOUS | Status: AC
  Administered 2023-05-08: 150 mg via INTRAVENOUS
  Filled 2023-05-08: qty 150

## 2023-05-08 MED ORDER — SODIUM CHLORIDE 0.9 % IV SOLN
150.0000 mg/m2 | Freq: Once | INTRAVENOUS | Status: AC
  Administered 2023-05-08: 300 mg via INTRAVENOUS
  Filled 2023-05-08: qty 15

## 2023-05-08 MED ORDER — ACETAMINOPHEN 325 MG PO TABS
650.0000 mg | ORAL_TABLET | Freq: Once | ORAL | Status: AC
  Administered 2023-05-08: 650 mg via ORAL
  Filled 2023-05-08: qty 2

## 2023-05-08 MED ORDER — DEXAMETHASONE SODIUM PHOSPHATE 10 MG/ML IJ SOLN
10.0000 mg | Freq: Once | INTRAMUSCULAR | Status: AC
  Administered 2023-05-08: 10 mg via INTRAVENOUS
  Filled 2023-05-08: qty 1

## 2023-05-08 MED ORDER — DEXTROSE 5 % IV SOLN
INTRAVENOUS | Status: DC
  Filled 2023-05-08 (×2): qty 250

## 2023-05-08 MED ORDER — PALONOSETRON HCL INJECTION 0.25 MG/5ML
0.2500 mg | Freq: Once | INTRAVENOUS | Status: AC
  Administered 2023-05-08: 0.25 mg via INTRAVENOUS
  Filled 2023-05-08: qty 5

## 2023-05-08 MED ORDER — LEUCOVORIN CALCIUM INJECTION 350 MG
400.0000 mg/m2 | Freq: Once | INTRAMUSCULAR | Status: AC
  Administered 2023-05-08: 760 mg via INTRAVENOUS
  Filled 2023-05-08: qty 20.5

## 2023-05-08 MED ORDER — SODIUM CHLORIDE 0.9 % IV SOLN
2400.0000 mg/m2 | INTRAVENOUS | Status: DC
  Filled 2023-05-08: qty 100

## 2023-05-08 MED ORDER — ATROPINE SULFATE 1 MG/ML IV SOLN
0.5000 mg | Freq: Once | INTRAVENOUS | Status: AC | PRN
  Administered 2023-05-08: 0.5 mg via INTRAVENOUS
  Filled 2023-05-08: qty 1

## 2023-05-08 NOTE — Progress Notes (Signed)
 Extended inf to 72 hrs for 5FU IVCI through the weekend. Ok'd by Dr. Johnnye Nancy, Pharm.D., CPP 05/08/2023@2 :35 PM
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
## 2023-05-08 NOTE — Patient Instructions (Signed)
 [facility] - A DEPT OF [facility]  Discharge Instructions: Thank you for choosing [facility] to provide your oncology and hematology care.  If you have a lab appointment with the Cancer Center, please go directly to the Cancer Center and check in at the registration area.  Wear comfortable clothing and clothing appropriate for easy access to any Portacath or PICC line.   We strive to give you quality time with your provider. You may need to reschedule your appointment if you arrive late (15 or more minutes).  Arriving late affects you and other patients whose appointments are after yours.  Also, if you miss three or more appointments without notifying the office, you may be dismissed from the clinic at the provider's discretion.      For prescription refill requests, have your pharmacy contact our office and allow 72 hours for refills to be completed.    Today you received the following chemotherapy and/or immunotherapy agents Oxaliplatin , Leucovorin , Irinotecan  and Adrucil        To help prevent nausea and vomiting after your treatment, we encourage you to take your nausea medication as directed.  BELOW ARE SYMPTOMS THAT SHOULD BE REPORTED IMMEDIATELY: *FEVER GREATER THAN 100.4 F (38 C) OR HIGHER *CHILLS OR SWEATING *NAUSEA AND VOMITING THAT IS NOT CONTROLLED WITH YOUR NAUSEA MEDICATION *UNUSUAL SHORTNESS OF BREATH *UNUSUAL BRUISING OR BLEEDING *URINARY PROBLEMS (pain or burning when urinating, or frequent urination) *BOWEL PROBLEMS (unusual diarrhea, constipation, pain near the anus) TENDERNESS IN MOUTH AND THROAT WITH OR WITHOUT PRESENCE OF ULCERS (sore throat, sores in mouth, or a toothache) UNUSUAL RASH, SWELLING OR PAIN  UNUSUAL VAGINAL DISCHARGE OR ITCHING   Items with * indicate a potential emergency and should be followed up as soon as possible or go to the Emergency Department if any problems should occur.  Please show the  CHEMOTHERAPY ALERT CARD or IMMUNOTHERAPY ALERT CARD at check-in to the Emergency Department and triage nurse.  Should you have questions after your visit or need to cancel or reschedule your appointment, please contact [facility] - A DEPT OF [facility]  [phone] and follow the prompts.  Office hours are 8:00 a.m. to 4:30 p.m. Monday - Friday. Please note that voicemails left after 4:00 p.m. may not be returned until the following business day.  We are closed weekends and major holidays. You have access to a nurse at all times for urgent questions. Please call the main number to the clinic [phone] and follow the prompts.  For any non-urgent questions, you may also contact your provider using MyChart. We now offer e-Visits for anyone 50 and older to request care online for non-urgent symptoms. For details visit [email or web address].   Also download the MyChart app! Go to the app store, search "MyChart", open the app, select [facility], and log in with your MyChart username and password.    Irinotecan  Injection What is this medication? IRINOTECAN  (ir in oh TEE kan) treats some types of cancer. It works by slowing down the growth of cancer cells. This medicine may be used for other purposes; ask your health care provider or pharmacist if you have questions. COMMON BRAND NAME(S): Camptosar  What should I tell my care team before I take this medication? They need to know if you have any of these conditions: Dehydration Diarrhea Infection, especially a viral infection, such as chickenpox, cold sores, herpes Liver disease Low blood cell  levels (white cells, red cells, and platelets) Low levels of electrolytes, such as calcium , magnesium , or potassium in your blood Recent or ongoing radiation An unusual or allergic reaction to irinotecan , other medications, foods, dyes, or preservatives If you or your partner are pregnant or trying to get  pregnant Breast-feeding How should I use this medication? This medication is injected into a vein. It is given by your care team in a hospital or clinic setting. Talk to your care team about the use of this medication in children. Special care may be needed. Overdosage: If you think you have taken too much of this medicine contact a poison control center or emergency room at once. NOTE: This medicine is only for you. Do not share this medicine with others. What if I miss a dose? Keep appointments for follow-up doses. It is important not to miss your dose. Call your care team if you are unable to keep an appointment. What may interact with this medication? Do not take this medication with any of the following: Cobicistat Itraconazole This medication may also interact with the following: Certain antibiotics, such as clarithromycin, rifampin, rifabutin Certain antivirals for HIV or AIDS Certain medications for fungal infections, such as ketoconazole, posaconazole, voriconazole Certain medications for seizures, such as carbamazepine, phenobarbital, phenytoin Gemfibrozil Nefazodone St. John's wort This list may not describe all possible interactions. Give your health care provider a list of all the medicines, herbs, non-prescription drugs, or dietary supplements you use. Also tell them if you smoke, drink alcohol, or use illegal drugs. Some items may interact with your medicine. What should I watch for while using this medication? Your condition will be monitored carefully while you are receiving this medication. You may need blood work while taking this medication. This medication may make you feel generally unwell. This is not uncommon as chemotherapy can affect healthy cells as well as cancer cells. Report any side effects. Continue your course of treatment even though you feel ill unless your care team tells you to stop. This medication can cause serious side effects. To reduce the risk, your  care team may give you other medications to take before receiving this one. Be sure to follow the directions from your care team. This medication may affect your coordination, reaction time, or judgement. Do not drive or operate machinery until you know how this medication affects you. Sit up or stand slowly to reduce the risk of dizzy or fainting spells. Drinking alcohol with this medication can increase the risk of these side effects. This medication may increase your risk of getting an infection. Call your care team for advice if you get a fever, chills, sore throat, or other symptoms of a cold or flu. Do not treat yourself. Try to avoid being around people who are sick. Avoid taking medications that contain aspirin, acetaminophen , ibuprofen, naproxen, or ketoprofen unless instructed by your care team. These medications may hide a fever. This medication may increase your risk to bruise or bleed. Call your care team if you notice any unusual bleeding. Be careful brushing or flossing your teeth or using a toothpick because you may get an infection or bleed more easily. If you have any dental work done, tell your dentist you are receiving this medication. Talk to your care team if you or your partner are pregnant or think either of you might be pregnant. This medication can cause serious birth defects if taken during pregnancy and for 6 months after the last dose. You will need a  negative pregnancy test before starting this medication. Contraception is recommended while taking this medication and for 6 months after the last dose. Your care team can help you find the option that works for you. Do not father a child while taking this medication and for 3 months after the last dose. Use a condom for contraception during this time period. Do not breastfeed while taking this medication and for 7 days after the last dose. This medication may cause infertility. Talk to your care team if you are concerned about  your fertility. What side effects may I notice from receiving this medication? Side effects that you should report to your care team as soon as possible: Allergic reactions--skin rash, itching, hives, swelling of the face, lips, tongue, or throat Dry cough, shortness of breath or trouble breathing Increased saliva or tears, increased sweating, stomach cramping, diarrhea, small pupils, unusual weakness or fatigue, slow heartbeat Infection--fever, chills, cough, sore throat, wounds that don't heal, pain or trouble when passing urine, general feeling of discomfort or being unwell Kidney injury--decrease in the amount of urine, swelling of the ankles, hands, or feet Low red blood cell level--unusual weakness or fatigue, dizziness, headache, trouble breathing Severe or prolonged diarrhea Unusual bruising or bleeding Side effects that usually do not require medical attention (report to your care team if they continue or are bothersome): Constipation Diarrhea Hair loss Loss of appetite Nausea Stomach pain This list may not describe all possible side effects. Call your doctor for medical advice about side effects. You may report side effects to FDA at 1-800-FDA-1088. Where should I keep my medication? This medication is given in a hospital or clinic. It will not be stored at home. NOTE: This sheet is a summary. It may not cover all possible information. If you have questions about this medicine, talk to your doctor, pharmacist, or health care provider.   Leucovorin  Injection What is this medication? LEUCOVORIN  (loo koe VOR in) prevents side effects from certain medications, such as methotrexate. It works by increasing folate levels. This helps protect healthy cells in your body. It may also be used to treat anemia caused by low levels of folate. It can also be used with fluorouracil , a type of chemotherapy, to treat colorectal cancer. It works by increasing the effects of fluorouracil  in the  body. This medicine may be used for other purposes; ask your health care provider or pharmacist if you have questions. What should I tell my care team before I take this medication? They need to know if you have any of these conditions: Anemia from low levels of vitamin B12 in the blood An unusual or allergic reaction to leucovorin , folic acid , other medications, foods, dyes, or preservatives Pregnant or trying to get pregnant Breastfeeding How should I use this medication? This medication is injected into a vein or a muscle. It is given by your care team in a hospital or clinic setting. Talk to your care team about the use of this medication in children. Special care may be needed. Overdosage: If you think you have taken too much of this medicine contact a poison control center or emergency room at once. NOTE: This medicine is only for you. Do not share this medicine with others. What if I miss a dose? Keep appointments for follow-up doses. It is important not to miss your dose. Call your care team if you are unable to keep an appointment. What may interact with this medication? Capecitabine Fluorouracil  Phenobarbital Phenytoin Primidone Trimethoprim;sulfamethoxazole This list may  not describe all possible interactions. Give your health care provider a list of all the medicines, herbs, non-prescription drugs, or dietary supplements you use. Also tell them if you smoke, drink alcohol, or use illegal drugs. Some items may interact with your medicine. What should I watch for while using this medication? Your condition will be monitored carefully while you are receiving this medication. This medication may increase the side effects of 5-fluorouracil . Tell your care team if you have diarrhea or mouth sores that do not get better or that get worse. What side effects may I notice from receiving this medication? Side effects that you should report to your care team as soon as possible: Allergic  reactions--skin rash, itching, hives, swelling of the face, lips, tongue, or throat This list may not describe all possible side effects. Call your doctor for medical advice about side effects. You may report side effects to FDA at 1-800-FDA-1088. Where should I keep my medication? This medication is given in a hospital or clinic. It will not be stored at home. NOTE: This sheet is a summary. It may not cover all possible information. If you have questions about this medicine, talk to your doctor, pharmacist, or health care provider.  2024 Elsevier/Gold Standard (2021-05-28 00:00:00)   Fluorouracil  Injection What is this medication? FLUOROURACIL  (flure oh YOOR a sil) treats some types of cancer. It works by slowing down the growth of cancer cells. This medicine may be used for other purposes; ask your health care provider or pharmacist if you have questions. COMMON BRAND NAME(S): Adrucil  What should I tell my care team before I take this medication? They need to know if you have any of these conditions: Blood disorders Dihydropyrimidine dehydrogenase (DPD) deficiency Infection, such as chickenpox, cold sores, herpes Kidney disease Liver disease Poor nutrition Recent or ongoing radiation therapy An unusual or allergic reaction to fluorouracil , other medications, foods, dyes, or preservatives If you or your partner are pregnant or trying to get pregnant Breast-feeding How should I use this medication? This medication is injected into a vein. It is administered by your care team in a hospital or clinic setting. Talk to your care team about the use of this medication in children. Special care may be needed. Overdosage: If you think you have taken too much of this medicine contact a poison control center or emergency room at once. NOTE: This medicine is only for you. Do not share this medicine with others. What if I miss a dose? Keep appointments for follow-up doses. It is important not to  miss your dose. Call your care team if you are unable to keep an appointment. What may interact with this medication? Do not take this medication with any of the following: Live virus vaccines This medication may also interact with the following: Medications that treat or prevent blood clots, such as warfarin, enoxaparin , dalteparin This list may not describe all possible interactions. Give your health care provider a list of all the medicines, herbs, non-prescription drugs, or dietary supplements you use. Also tell them if you smoke, drink alcohol, or use illegal drugs. Some items may interact with your medicine. What should I watch for while using this medication? Your condition will be monitored carefully while you are receiving this medication. This medication may make you feel generally unwell. This is not uncommon as chemotherapy can affect healthy cells as well as cancer cells. Report any side effects. Continue your course of treatment even though you feel ill unless your care team tells  you to stop. In some cases, you may be given additional medications to help with side effects. Follow all directions for their use. This medication may increase your risk of getting an infection. Call your care team for advice if you get a fever, chills, sore throat, or other symptoms of a cold or flu. Do not treat yourself. Try to avoid being around people who are sick. This medication may increase your risk to bruise or bleed. Call your care team if you notice any unusual bleeding. Be careful brushing or flossing your teeth or using a toothpick because you may get an infection or bleed more easily. If you have any dental work done, tell your dentist you are receiving this medication. Avoid taking medications that contain aspirin, acetaminophen , ibuprofen, naproxen, or ketoprofen unless instructed by your care team. These medications may hide a fever. Do not treat diarrhea with over the counter products.  Contact your care team if you have diarrhea that lasts more than 2 days or if it is severe and watery. This medication can make you more sensitive to the sun. Keep out of the sun. If you cannot avoid being in the sun, wear protective clothing and sunscreen. Do not use sun lamps, tanning beds, or tanning booths. Talk to your care team if you or your partner wish to become pregnant or think you might be pregnant. This medication can cause serious birth defects if taken during pregnancy and for 3 months after the last dose. A reliable form of contraception is recommended while taking this medication and for 3 months after the last dose. Talk to your care team about effective forms of contraception. Do not father a child while taking this medication and for 3 months after the last dose. Use a condom while having sex during this time period. Do not breastfeed while taking this medication. This medication may cause infertility. Talk to your care team if you are concerned about your fertility. What side effects may I notice from receiving this medication? Side effects that you should report to your care team as soon as possible: Allergic reactions--skin rash, itching, hives, swelling of the face, lips, tongue, or throat Heart attack--pain or tightness in the chest, shoulders, arms, or jaw, nausea, shortness of breath, cold or clammy skin, feeling faint or lightheaded Heart failure--shortness of breath, swelling of the ankles, feet, or hands, sudden weight gain, unusual weakness or fatigue Heart rhythm changes--fast or irregular heartbeat, dizziness, feeling faint or lightheaded, chest pain, trouble breathing High ammonia level--unusual weakness or fatigue, confusion, loss of appetite, nausea, vomiting, seizures Infection--fever, chills, cough, sore throat, wounds that don't heal, pain or trouble when passing urine, general feeling of discomfort or being unwell Low red blood cell level--unusual weakness or  fatigue, dizziness, headache, trouble breathing Pain, tingling, or numbness in the hands or feet, muscle weakness, change in vision, confusion or trouble speaking, loss of balance or coordination, trouble walking, seizures Redness, swelling, and blistering of the skin over hands and feet Severe or prolonged diarrhea Unusual bruising or bleeding Side effects that usually do not require medical attention (report to your care team if they continue or are bothersome): Dry skin Headache Increased tears Nausea Pain, redness, or swelling with sores inside the mouth or throat Sensitivity to light Vomiting This list may not describe all possible side effects. Call your doctor for medical advice about side effects. You may report side effects to FDA at 1-800-FDA-1088. Where should I keep my medication? This medication is given in  a hospital or clinic. It will not be stored at home. NOTE: This sheet is a summary. It may not cover all possible information. If you have questions about this medicine, talk to your doctor, pharmacist, or health care provider.  2024 Elsevier/Gold Standard (2021-04-30 00:00:00)

## 2023-05-08 NOTE — Progress Notes (Signed)
 [facility] CSW Progress Note  Visual merchandiser met with patient in infusion to offer active listening and supportive counseling.  Patient expressed how important her faith was to her and her husband.  Discussed advance directives and provided her with a copy.  Also gave her CSW contact information and explained the process.    Kennth Peal, LCSW Clinical Social Worker [facility]

## 2023-05-08 NOTE — Research (Signed)
 Trial:  Effectiveness of Out-of-Pocket Psychologist, forensic ([employer]) in Cancer Patients  Patient Kathleen Russell was identified by this nurse as a potential candidate for the above listed study.  This Clinical Research Nurse met with ARNETT CARDY, [id number], on 05/08/23 in a manner and location that ensures patient privacy to discuss participation in the above listed research study.  Patient is Unaccompanied.  A copy of the informed consent document and separate HIPAA Authorization was provided to the patient.  Patient reads, speaks, and understands [place].   Patient was provided with the business card of this Nurse and encouraged to contact the research team with any questions.  Approximately 15 minutes were spent with the patient reviewing the informed consent documents.  Patient was provided the option of taking informed consent documents home to review and was encouraged to review at their convenience with their support network, including other care providers. Patient took the consent documents home to review. Research nurse will contact the patient next Wednesday at her request for her decision. She states she wants to read over the information and discuss it with her husband.  [name], RN 05/08/23 10:16 AM   Research nurse returned call to patient today to see if she would be interested in participating in the study. She states she is going to pass on that right now as there is too much going on. She states she might think about it later. Research nurse thanked patient for her consideration of the clinical trial.  [name], RN 05/14/23 4:17 PM

## 2023-05-08 NOTE — Progress Notes (Unsigned)
 Pt reports feeling as if "my heart is beating fast" VSS (see flowsheets) manual HR check was 100 and regular. No other symptoms noted. Dr. Randy Buttery aware, per Dr. Randy Buttery continue with treatment.   1512: pt reports itching to the right side, pt states this can be normal with her dry skin. No hives or rash noted, no other s/s noted. Per Dr. Randy Buttery okay to discharge pt home and pt to take Benadryl if itching worsens, pt aware and verbalizes understanding. Pt and VS stable (pt will monitor B/P at home, and seek medical care if b/p continues to rise of hypertension symptoms develop) pt BP 158/82 and stable at discharge.

## 2023-05-11 ENCOUNTER — Telehealth: Payer: Self-pay

## 2023-05-11 ENCOUNTER — Other Ambulatory Visit: Payer: Self-pay | Admitting: Internal Medicine

## 2023-05-11 ENCOUNTER — Inpatient Hospital Stay

## 2023-05-11 VITALS — BP 137/93 | HR 81 | Temp 98.0°F | Resp 16

## 2023-05-11 DIAGNOSIS — C252 Malignant neoplasm of tail of pancreas: Secondary | ICD-10-CM

## 2023-05-11 DIAGNOSIS — Z5111 Encounter for antineoplastic chemotherapy: Secondary | ICD-10-CM | POA: Diagnosis not present

## 2023-05-11 MED ORDER — HEPARIN SOD (PORK) LOCK FLUSH 100 UNIT/ML IV SOLN
500.0000 [IU] | Freq: Once | INTRAVENOUS | Status: AC | PRN
Start: 2023-05-11 — End: 2023-05-11
  Administered 2023-05-11: 500 [IU]
  Filled 2023-05-11: qty 5

## 2023-05-11 MED ORDER — SODIUM CHLORIDE 0.9% FLUSH
10.0000 mL | INTRAVENOUS | Status: DC | PRN
  Administered 2023-05-11: 10 mL
  Filled 2023-05-11: qty 10

## 2023-05-11 NOTE — Telephone Encounter (Signed)
Telephone call to patient for follow up after receiving first infusion.   Patient states infusion went great.  States eating good and drinking plenty of fluids.   Denies any nausea or vomiting.  Encouraged patient to call for any concerns or questions. 

## 2023-05-12 ENCOUNTER — Ambulatory Visit
Admission: RE | Admit: 2023-05-12 | Discharge: 2023-05-12 | Disposition: A | Discharge status: Home / Self Care | Source: Ambulatory Visit | Attending: Oncology | Admitting: Oncology

## 2023-05-12 DIAGNOSIS — K5730 Diverticulosis of large intestine without perforation or abscess without bleeding: Secondary | ICD-10-CM | POA: Diagnosis not present

## 2023-05-12 DIAGNOSIS — I700 Atherosclerosis of aorta: Secondary | ICD-10-CM | POA: Diagnosis not present

## 2023-05-12 DIAGNOSIS — R9389 Abnormal findings on diagnostic imaging of other specified body structures: Secondary | ICD-10-CM | POA: Insufficient documentation

## 2023-05-12 DIAGNOSIS — K8689 Other specified diseases of pancreas: Secondary | ICD-10-CM | POA: Diagnosis not present

## 2023-05-12 LAB — GLUCOSE, CAPILLARY: Glucose-Capillary: 165 mg/dL — ABNORMAL HIGH (ref 70–99)

## 2023-05-12 MED ORDER — FLUDEOXYGLUCOSE F - 18 (FDG) INJECTION
9.9000 | Freq: Once | INTRAVENOUS | Status: AC | PRN
  Administered 2023-05-12: 9.9 via INTRAVENOUS

## 2023-05-14 ENCOUNTER — Ambulatory Visit: Payer: PPO | Admitting: Internal Medicine

## 2023-05-14 ENCOUNTER — Encounter: Payer: Self-pay | Admitting: Oncology

## 2023-05-15 DIAGNOSIS — C252 Malignant neoplasm of tail of pancreas: Secondary | ICD-10-CM | POA: Diagnosis not present

## 2023-05-18 ENCOUNTER — Encounter: Payer: Self-pay | Admitting: Oncology

## 2023-05-21 MED FILL — Fosaprepitant Dimeglumine For IV Infusion 150 MG (Base Eq): INTRAVENOUS | Qty: 5 | Status: AC

## 2023-05-22 ENCOUNTER — Inpatient Hospital Stay: Admitting: Oncology

## 2023-05-22 ENCOUNTER — Inpatient Hospital Stay

## 2023-05-22 ENCOUNTER — Encounter: Payer: Self-pay | Admitting: Oncology

## 2023-05-22 VITALS — BP 128/66 | HR 91 | Temp 98.7°F | Resp 19

## 2023-05-22 VITALS — BP 133/78 | HR 110 | Temp 99.5°F | Resp 18 | Ht 64.0 in | Wt 174.4 lb

## 2023-05-22 DIAGNOSIS — C252 Malignant neoplasm of tail of pancreas: Secondary | ICD-10-CM

## 2023-05-22 DIAGNOSIS — T451X5A Adverse effect of antineoplastic and immunosuppressive drugs, initial encounter: Secondary | ICD-10-CM | POA: Diagnosis not present

## 2023-05-22 DIAGNOSIS — D701 Agranulocytosis secondary to cancer chemotherapy: Secondary | ICD-10-CM | POA: Diagnosis not present

## 2023-05-22 DIAGNOSIS — K5903 Drug induced constipation: Secondary | ICD-10-CM | POA: Diagnosis not present

## 2023-05-22 DIAGNOSIS — K8689 Other specified diseases of pancreas: Secondary | ICD-10-CM

## 2023-05-22 DIAGNOSIS — Z5111 Encounter for antineoplastic chemotherapy: Secondary | ICD-10-CM | POA: Diagnosis not present

## 2023-05-22 DIAGNOSIS — C259 Malignant neoplasm of pancreas, unspecified: Secondary | ICD-10-CM | POA: Diagnosis not present

## 2023-05-22 LAB — CBC WITH DIFFERENTIAL (CANCER CENTER ONLY)
Abs Immature Granulocytes: 0.01 10*3/uL (ref 0.00–0.07)
Basophils Absolute: 0 10*3/uL (ref 0.0–0.1)
Basophils Relative: 1 %
Eosinophils Absolute: 0.1 10*3/uL (ref 0.0–0.5)
Eosinophils Relative: 5 %
HCT: 35 % — ABNORMAL LOW (ref 36.0–46.0)
Hemoglobin: 11.8 g/dL — ABNORMAL LOW (ref 12.0–15.0)
Immature Granulocytes: 0 %
Lymphocytes Relative: 42 %
Lymphs Abs: 1.3 10*3/uL (ref 0.7–4.0)
MCH: 28.6 pg (ref 26.0–34.0)
MCHC: 33.7 g/dL (ref 30.0–36.0)
MCV: 84.7 fL (ref 80.0–100.0)
Monocytes Absolute: 0.4 10*3/uL (ref 0.1–1.0)
Monocytes Relative: 12 %
Neutro Abs: 1.2 10*3/uL — ABNORMAL LOW (ref 1.7–7.7)
Neutrophils Relative %: 40 %
Platelet Count: 176 10*3/uL (ref 150–400)
RBC: 4.13 MIL/uL (ref 3.87–5.11)
RDW: 11.9 % (ref 11.5–15.5)
WBC Count: 3 10*3/uL — ABNORMAL LOW (ref 4.0–10.5)
nRBC: 0 % (ref 0.0–0.2)

## 2023-05-22 LAB — CMP (CANCER CENTER ONLY)
ALT: 23 U/L (ref 0–44)
AST: 19 U/L (ref 15–41)
Albumin: 3.1 g/dL — ABNORMAL LOW (ref 3.5–5.0)
Alkaline Phosphatase: 60 U/L (ref 38–126)
Anion gap: 10 (ref 5–15)
BUN: 11 mg/dL (ref 8–23)
CO2: 21 mmol/L — ABNORMAL LOW (ref 22–32)
Calcium: 8.2 mg/dL — ABNORMAL LOW (ref 8.9–10.3)
Chloride: 101 mmol/L (ref 98–111)
Creatinine: 0.76 mg/dL (ref 0.44–1.00)
GFR, Estimated: >60 mL/min (ref >60)
Glucose, Bld: 195 mg/dL — ABNORMAL HIGH (ref 70–99)
Potassium: 3.4 mmol/L — ABNORMAL LOW (ref 3.5–5.1)
Sodium: 132 mmol/L — ABNORMAL LOW (ref 135–145)
Total Bilirubin: 0.4 mg/dL (ref 0.0–1.2)
Total Protein: 6.6 g/dL (ref 6.5–8.1)

## 2023-05-22 MED ORDER — ATROPINE SULFATE 1 MG/ML IV SOLN
0.5000 mg | Freq: Once | INTRAVENOUS | Status: AC | PRN
  Administered 2023-05-22: 0.5 mg via INTRAVENOUS
  Filled 2023-05-22: qty 1

## 2023-05-22 MED ORDER — SODIUM CHLORIDE 0.9 % IV SOLN
150.0000 mg/m2 | Freq: Once | INTRAVENOUS | Status: AC
  Administered 2023-05-22: 300 mg via INTRAVENOUS
  Filled 2023-05-22: qty 15

## 2023-05-22 MED ORDER — DEXAMETHASONE SODIUM PHOSPHATE 10 MG/ML IJ SOLN
10.0000 mg | Freq: Once | INTRAMUSCULAR | Status: AC
  Administered 2023-05-22: 10 mg via INTRAVENOUS
  Filled 2023-05-22: qty 1

## 2023-05-22 MED ORDER — PALONOSETRON HCL INJECTION 0.25 MG/5ML
0.2500 mg | Freq: Once | INTRAVENOUS | Status: AC
  Administered 2023-05-22: 0.25 mg via INTRAVENOUS
  Filled 2023-05-22: qty 5

## 2023-05-22 MED ORDER — SODIUM CHLORIDE 0.9 % IV SOLN
400.0000 mg/m2 | Freq: Once | INTRAVENOUS | Status: AC
  Administered 2023-05-22: 760 mg via INTRAVENOUS
  Filled 2023-05-22: qty 25

## 2023-05-22 MED ORDER — SODIUM CHLORIDE 0.9 % IV SOLN
150.0000 mg | Freq: Once | INTRAVENOUS | Status: AC
  Administered 2023-05-22: 150 mg via INTRAVENOUS
  Filled 2023-05-22: qty 150

## 2023-05-22 MED ORDER — SODIUM CHLORIDE 0.9 % IV SOLN
4500.0000 mg | INTRAVENOUS | Status: DC
  Administered 2023-05-22: 4500 mg via INTRAVENOUS
  Filled 2023-05-22: qty 90

## 2023-05-22 MED ORDER — DEXTROSE 5 % IV SOLN
INTRAVENOUS | Status: DC
  Filled 2023-05-22: qty 250

## 2023-05-22 MED ORDER — OXALIPLATIN CHEMO INJECTION 100 MG/20ML
65.0000 mg/m2 | Freq: Once | INTRAVENOUS | Status: AC
Start: 2023-05-22 — End: 2023-05-22
  Administered 2023-05-22: 125 mg via INTRAVENOUS
  Filled 2023-05-22: qty 5
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
## 2023-05-22 NOTE — Progress Notes (Signed)
 Per Dr. Randy Buttery  OK to updated the 5FU infusion dose to 4500 mg using dose basis of 2400 mg/m2 and current weight of 79.1 kg (BSA 1.89).  Infusion duration changed to 70 hours given pump disconnect scheduled 3 days from today.   Glendora Landsman, PharmD, BCPS Clinical Pharmacist

## 2023-05-22 NOTE — Progress Notes (Signed)
 Diarrhea this morning x2, using lomotil.  Appetite 50% normal, organ children's protein drink 1-2 per day.  Feet tingle since 2 days ago.

## 2023-05-22 NOTE — Patient Instructions (Signed)
 [facility] - A DEPT OF [facility]  Discharge Instructions: Thank you for choosing [facility] to provide your oncology and hematology care.  If you have a lab appointment with the Cancer Center, please go directly to the Cancer Center and check in at the registration area.  Wear comfortable clothing and clothing appropriate for easy access to any Portacath or PICC line.   We strive to give you quality time with your provider. You may need to reschedule your appointment if you arrive late (15 or more minutes).  Arriving late affects you and other patients whose appointments are after yours.  Also, if you miss three or more appointments without notifying the office, you may be dismissed from the clinic at the provider's discretion.      For prescription refill requests, have your pharmacy contact our office and allow 72 hours for refills to be completed.    Today you received the following chemotherapy and/or immunotherapy agents oxaliplatin , irinotecan , leucovorin , and adrucil       To help prevent nausea and vomiting after your treatment, we encourage you to take your nausea medication as directed.  BELOW ARE SYMPTOMS THAT SHOULD BE REPORTED IMMEDIATELY: *FEVER GREATER THAN 100.4 F (38 C) OR HIGHER *CHILLS OR SWEATING *NAUSEA AND VOMITING THAT IS NOT CONTROLLED WITH YOUR NAUSEA MEDICATION *UNUSUAL SHORTNESS OF BREATH *UNUSUAL BRUISING OR BLEEDING *URINARY PROBLEMS (pain or burning when urinating, or frequent urination) *BOWEL PROBLEMS (unusual diarrhea, constipation, pain near the anus) TENDERNESS IN MOUTH AND THROAT WITH OR WITHOUT PRESENCE OF ULCERS (sore throat, sores in mouth, or a toothache) UNUSUAL RASH, SWELLING OR PAIN  UNUSUAL VAGINAL DISCHARGE OR ITCHING   Items with * indicate a potential emergency and should be followed up as soon as possible or go to the Emergency Department if any problems should occur.  Please show the  CHEMOTHERAPY ALERT CARD or IMMUNOTHERAPY ALERT CARD at check-in to the Emergency Department and triage nurse.  Should you have questions after your visit or need to cancel or reschedule your appointment, please contact [facility] - A DEPT OF [facility]  [phone] and follow the prompts.  Office hours are 8:00 a.m. to 4:30 p.m. Monday - Friday. Please note that voicemails left after 4:00 p.m. may not be returned until the following business day.  We are closed weekends and major holidays. You have access to a nurse at all times for urgent questions. Please call the main number to the clinic [phone] and follow the prompts.  For any non-urgent questions, you may also contact your provider using MyChart. We now offer e-Visits for anyone 36 and older to request care online for non-urgent symptoms. For details visit [email or web address].   Also download the MyChart app! Go to the app store, search "MyChart", open the app, select [place], and log in with your MyChart username and password.

## 2023-05-23 LAB — CANCER ANTIGEN 19-9: CA 19-9: 505 U/mL — ABNORMAL HIGH (ref 0–35)

## 2023-05-25 ENCOUNTER — Inpatient Hospital Stay

## 2023-05-25 VITALS — BP 126/71 | HR 83 | Temp 98.3°F | Resp 18

## 2023-05-25 DIAGNOSIS — Z5111 Encounter for antineoplastic chemotherapy: Secondary | ICD-10-CM | POA: Diagnosis not present

## 2023-05-25 DIAGNOSIS — C252 Malignant neoplasm of tail of pancreas: Secondary | ICD-10-CM

## 2023-05-25 MED ORDER — HEPARIN SOD (PORK) LOCK FLUSH 100 UNIT/ML IV SOLN
500.0000 [IU] | Freq: Once | INTRAVENOUS | Status: AC | PRN
Start: 2023-05-25 — End: 2023-05-25
  Administered 2023-05-25: 500 [IU]
  Filled 2023-05-25: qty 5

## 2023-05-25 MED ORDER — PEGFILGRASTIM-CBQV 6 MG/0.6ML ~~LOC~~ SOSY
6.0000 mg | PREFILLED_SYRINGE | Freq: Once | SUBCUTANEOUS | Status: AC
  Administered 2023-05-25: 6 mg via SUBCUTANEOUS
  Filled 2023-05-25: qty 0.6

## 2023-05-25 MED ORDER — SODIUM CHLORIDE 0.9% FLUSH
10.0000 mL | INTRAVENOUS | Status: DC | PRN
  Administered 2023-05-25: 10 mL
  Filled 2023-05-25: qty 10

## 2023-05-25 NOTE — Patient Instructions (Signed)

## 2023-05-26 ENCOUNTER — Other Ambulatory Visit: Payer: Self-pay

## 2023-05-26 ENCOUNTER — Ambulatory Visit: Payer: PPO

## 2023-05-27 ENCOUNTER — Telehealth: Payer: Self-pay | Admitting: *Deleted

## 2023-05-27 ENCOUNTER — Encounter: Payer: Self-pay | Admitting: Oncology

## 2023-05-27 ENCOUNTER — Other Ambulatory Visit: Payer: Self-pay

## 2023-05-27 ENCOUNTER — Inpatient Hospital Stay: Admitting: Hospice and Palliative Medicine

## 2023-05-27 DIAGNOSIS — C252 Malignant neoplasm of tail of pancreas: Secondary | ICD-10-CM

## 2023-05-27 MED ORDER — STERILE WATER FOR INJECTION IJ SOLN
10.0000 mL | Freq: Four times a day (QID) | OROMUCOSAL | 3 refills | Status: DC
  Filled 2023-05-27: qty 480, 12d supply, fill #0
  Filled 2023-06-07: qty 480, 12d supply, fill #1
  Filled 2023-06-24: qty 480, 12d supply, fill #2
  Filled 2023-07-09: qty 480, 12d supply, fill #3

## 2023-05-27 NOTE — Telephone Encounter (Signed)
 Patient called to say that she had her second chemo Friday and then Monday had her injection and she has got a lot of thrush in her mouth got white pieces and they are one of them has opened up and bleeding a little bit.  Very sore.  I asked Lauren if any be okay to do a Magic mouthwash.  He says it is fine.  Checked with her pharmacy they cannot compact that.  So we sent her to [facility] told her how to get there and a fax the Magic mouthwash already to [facility] pharmacy.  Patient is aware of that

## 2023-05-27 NOTE — Progress Notes (Signed)
 Multidisciplinary Oncology Council Documentation  Kathleen Russell was presented by our [facility] on 05/27/2023, which included representatives from:  Palliative Care Dietitian  Physical/Occupational Therapist Nurse Navigator Genetics Social work Survivorship RN Financial Navigator Research RN   Kathleen Russell currently presents with history of pancreatic ca  We reviewed previous medical and familial history, history of present illness, and recent lab results along with all available histopathologic and imaging studies. The MOC considered available treatment options and made the following recommendations/referrals:  Nutrition, SW  The MOC is a meeting of clinicians from various specialty areas who evaluate and discuss patients for whom a multidisciplinary approach is being considered. Final determinations in the plan of care are those of the provider(s).   Today's extended care, comprehensive team conference, Kathleen Russell was not present for the discussion and was not examined.

## 2023-05-28 ENCOUNTER — Ambulatory Visit
Admission: RE | Admit: 2023-05-28 | Discharge: 2023-05-28 | Disposition: A | Discharge status: Home / Self Care | Source: Ambulatory Visit | Attending: Internal Medicine | Admitting: Internal Medicine

## 2023-05-28 DIAGNOSIS — Z1231 Encounter for screening mammogram for malignant neoplasm of breast: Secondary | ICD-10-CM | POA: Diagnosis not present

## 2023-06-04 MED FILL — Fosaprepitant Dimeglumine For IV Infusion 150 MG (Base Eq): INTRAVENOUS | Qty: 5 | Status: AC

## 2023-06-05 ENCOUNTER — Inpatient Hospital Stay

## 2023-06-05 ENCOUNTER — Encounter: Payer: Self-pay | Admitting: Oncology

## 2023-06-05 ENCOUNTER — Inpatient Hospital Stay: Admitting: Oncology

## 2023-06-05 VITALS — BP 131/78 | HR 95 | Temp 97.8°F | Resp 17 | Wt 171.0 lb

## 2023-06-05 VITALS — BP 136/62 | HR 81 | Temp 96.6°F | Resp 18

## 2023-06-05 DIAGNOSIS — C259 Malignant neoplasm of pancreas, unspecified: Secondary | ICD-10-CM | POA: Diagnosis not present

## 2023-06-05 DIAGNOSIS — C252 Malignant neoplasm of tail of pancreas: Secondary | ICD-10-CM

## 2023-06-05 DIAGNOSIS — Z5111 Encounter for antineoplastic chemotherapy: Secondary | ICD-10-CM

## 2023-06-05 DIAGNOSIS — T451X5A Adverse effect of antineoplastic and immunosuppressive drugs, initial encounter: Secondary | ICD-10-CM

## 2023-06-05 LAB — CBC WITH DIFFERENTIAL (CANCER CENTER ONLY)
Abs Immature Granulocytes: 0.84 10*3/uL — ABNORMAL HIGH (ref 0.00–0.07)
Basophils Absolute: 0.1 10*3/uL (ref 0.0–0.1)
Basophils Relative: 0 %
Eosinophils Absolute: 0.1 10*3/uL (ref 0.0–0.5)
Eosinophils Relative: 1 %
HCT: 38.5 % (ref 36.0–46.0)
Hemoglobin: 12.8 g/dL (ref 12.0–15.0)
Immature Granulocytes: 7 %
Lymphocytes Relative: 20 %
Lymphs Abs: 2.6 10*3/uL (ref 0.7–4.0)
MCH: 28.7 pg (ref 26.0–34.0)
MCHC: 33.2 g/dL (ref 30.0–36.0)
MCV: 86.3 fL (ref 80.0–100.0)
Monocytes Absolute: 0.9 10*3/uL (ref 0.1–1.0)
Monocytes Relative: 7 %
Neutro Abs: 8.3 10*3/uL — ABNORMAL HIGH (ref 1.7–7.7)
Neutrophils Relative %: 65 %
Platelet Count: 134 10*3/uL — ABNORMAL LOW (ref 150–400)
RBC: 4.46 MIL/uL (ref 3.87–5.11)
RDW: 13.7 % (ref 11.5–15.5)
Smear Review: NORMAL
WBC Count: 12.8 10*3/uL — ABNORMAL HIGH (ref 4.0–10.5)
nRBC: 0.5 % — ABNORMAL HIGH (ref 0.0–0.2)

## 2023-06-05 LAB — CMP (CANCER CENTER ONLY)
ALT: 14 U/L (ref 0–44)
AST: 14 U/L — ABNORMAL LOW (ref 15–41)
Albumin: 3.2 g/dL — ABNORMAL LOW (ref 3.5–5.0)
Alkaline Phosphatase: 96 U/L (ref 38–126)
Anion gap: 9 (ref 5–15)
BUN: 13 mg/dL (ref 8–23)
CO2: 23 mmol/L (ref 22–32)
Calcium: 8.1 mg/dL — ABNORMAL LOW (ref 8.9–10.3)
Chloride: 105 mmol/L (ref 98–111)
Creatinine: 0.63 mg/dL (ref 0.44–1.00)
GFR, Estimated: >60 mL/min (ref >60)
Glucose, Bld: 144 mg/dL — ABNORMAL HIGH (ref 70–99)
Potassium: 3.5 mmol/L (ref 3.5–5.1)
Sodium: 137 mmol/L (ref 135–145)
Total Bilirubin: 0.3 mg/dL (ref 0.0–1.2)
Total Protein: 6.1 g/dL — ABNORMAL LOW (ref 6.5–8.1)

## 2023-06-05 MED ORDER — PALONOSETRON HCL INJECTION 0.25 MG/5ML
0.2500 mg | Freq: Once | INTRAVENOUS | Status: AC
  Administered 2023-06-05: 0.25 mg via INTRAVENOUS

## 2023-06-05 MED ORDER — SODIUM CHLORIDE 0.9 % IV SOLN: 4500.0000 mg | INTRAVENOUS | Status: DC

## 2023-06-05 MED ORDER — DEXTROSE 5 % IV SOLN
INTRAVENOUS | Status: DC
  Filled 2023-06-05: qty 250

## 2023-06-05 MED ORDER — SODIUM CHLORIDE 0.9 % IV SOLN
4500.0000 mg | INTRAVENOUS | Status: DC
  Administered 2023-06-05: 4500 mg via INTRAVENOUS
  Filled 2023-06-05: qty 90

## 2023-06-05 MED ORDER — OXALIPLATIN CHEMO INJECTION 100 MG/20ML
65.0000 mg/m2 | Freq: Once | INTRAVENOUS | Status: AC
  Administered 2023-06-05: 125 mg via INTRAVENOUS
  Filled 2023-06-05: qty 5

## 2023-06-05 MED ORDER — SODIUM CHLORIDE 0.9 % IV SOLN
150.0000 mg/m2 | Freq: Once | INTRAVENOUS | Status: AC
  Administered 2023-06-05: 300 mg via INTRAVENOUS
  Filled 2023-06-05: qty 15

## 2023-06-05 MED ORDER — SODIUM CHLORIDE 0.9 % IV SOLN
400.0000 mg/m2 | Freq: Once | INTRAVENOUS | Status: AC
  Administered 2023-06-05: 760 mg via INTRAVENOUS
  Filled 2023-06-05: qty 25

## 2023-06-05 MED ORDER — DEXAMETHASONE SODIUM PHOSPHATE 10 MG/ML IJ SOLN
10.0000 mg | Freq: Once | INTRAMUSCULAR | Status: AC
  Administered 2023-06-05: 10 mg via INTRAVENOUS

## 2023-06-05 MED ORDER — ATROPINE SULFATE 1 MG/ML IV SOLN
0.5000 mg | Freq: Once | INTRAVENOUS | Status: AC | PRN
  Administered 2023-06-05: 0.5 mg via INTRAVENOUS
  Filled 2023-06-05: qty 1

## 2023-06-05 MED ORDER — SODIUM CHLORIDE 0.9 % IV SOLN
150.0000 mg | Freq: Once | INTRAVENOUS | Status: AC
  Administered 2023-06-05: 150 mg via INTRAVENOUS
  Filled 2023-06-05: qty 150

## 2023-06-05 NOTE — Progress Notes (Signed)
 Patient here for oncology follow-up appointment, concerns of diarrhea and needing dental clearance for apt on 10th

## 2023-06-05 NOTE — Patient Instructions (Signed)
 [facility] - A DEPT OF [facility]  Discharge Instructions: Thank you for choosing [facility] to provide your oncology and hematology care.  If you have a lab appointment with the Cancer Center, please go directly to the Cancer Center and check in at the registration area.  Wear comfortable clothing and clothing appropriate for easy access to any Portacath or PICC line.   We strive to give you quality time with your provider. You may need to reschedule your appointment if you arrive late (15 or more minutes).  Arriving late affects you and other patients whose appointments are after yours.  Also, if you miss three or more appointments without notifying the office, you may be dismissed from the clinic at the provider's discretion.      For prescription refill requests, have your pharmacy contact our office and allow 72 hours for refills to be completed.    Today you received the following chemotherapy and/or immunotherapy agents oxaliplatin , leucovorin , irinotecan , and adrucil       To help prevent nausea and vomiting after your treatment, we encourage you to take your nausea medication as directed.  BELOW ARE SYMPTOMS THAT SHOULD BE REPORTED IMMEDIATELY: *FEVER GREATER THAN 100.4 F (38 C) OR HIGHER *CHILLS OR SWEATING *NAUSEA AND VOMITING THAT IS NOT CONTROLLED WITH YOUR NAUSEA MEDICATION *UNUSUAL SHORTNESS OF BREATH *UNUSUAL BRUISING OR BLEEDING *URINARY PROBLEMS (pain or burning when urinating, or frequent urination) *BOWEL PROBLEMS (unusual diarrhea, constipation, pain near the anus) TENDERNESS IN MOUTH AND THROAT WITH OR WITHOUT PRESENCE OF ULCERS (sore throat, sores in mouth, or a toothache) UNUSUAL RASH, SWELLING OR PAIN  UNUSUAL VAGINAL DISCHARGE OR ITCHING   Items with * indicate a potential emergency and should be followed up as soon as possible or go to the Emergency Department if any problems should occur.  Please show the  CHEMOTHERAPY ALERT CARD or IMMUNOTHERAPY ALERT CARD at check-in to the Emergency Department and triage nurse.  Should you have questions after your visit or need to cancel or reschedule your appointment, please contact [facility] - A DEPT OF [facility]  [phone] and follow the prompts.  Office hours are 8:00 a.m. to 4:30 p.m. Monday - Friday. Please note that voicemails left after 4:00 p.m. may not be returned until the following business day.  We are closed weekends and major holidays. You have access to a nurse at all times for urgent questions. Please call the main number to the clinic [phone] and follow the prompts.  For any non-urgent questions, you may also contact your provider using MyChart. We now offer e-Visits for anyone 37 and older to request care online for non-urgent symptoms. For details visit [email or web address].   Also download the MyChart app! Go to the app store, search "MyChart", open the app, select [place], and log in with your MyChart username and password.

## 2023-06-05 NOTE — Patient Instructions (Signed)
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
## 2023-06-05 NOTE — Progress Notes (Signed)
 Nutrition Assessment   Reason for Assessment:  New pancreatic cancer on chemotherapy   ASSESSMENT:  68 year old female with pancreatic cancer.  Past medical history of GERD, DM.  Patient receiving modified folfirinox.  Met with patient during infusion.  Reports that she is having taste alterations.  Had issues with thrush, mouth pain but magic mouthwash helped.  Also had issued with diarrhea then followed by constipation.  Miralax  and senna is helping with constipation.  Has been trying to eat eggs, greek yogurt, chobani drinks (warmed), pasta dishes, cottage cheese.  Savory foods tend to work better for her right now.  Denies nausea.     Medications: magic mouthwash, nexium , januvia , decadron , compazine , zofran , imodium , miralax , senna   Labs: reviewed   Anthropometrics:   Height: 64 inches Weight: 171 lb today 181 lb on 3/4 BMI: 29  5% weight loss in the last 3 months   Estimated Energy Needs  Kcals: 1950-2300 Protein: 98-115 g Fluid: 1950-2300 ml   NUTRITION DIAGNOSIS: Inadequate oral intake related to cancer and related treatment side effects as evidenced by taste alterations, diarrhea/constipation, 5% weight loss in the last 3 months   INTERVENTION:  Encouraged foods rich in protein Continue trying different flavors Encouraged hydration Continue bowel regimen to help with constipation Contact information provided   MONITORING, EVALUATION, GOAL: weight trends, intake   Next Visit: Friday, June 13 during infusion  Dmarco Baldus B. Zollie Hipp, CSO, LDN Registered Dietitian [phone]

## 2023-06-06 LAB — CANCER ANTIGEN 19-9: CA 19-9: 472 U/mL — ABNORMAL HIGH (ref 0–35)

## 2023-06-08 ENCOUNTER — Inpatient Hospital Stay

## 2023-06-08 ENCOUNTER — Inpatient Hospital Stay: Attending: Oncology

## 2023-06-08 ENCOUNTER — Other Ambulatory Visit: Payer: Self-pay

## 2023-06-08 VITALS — BP 135/61

## 2023-06-08 DIAGNOSIS — Z452 Encounter for adjustment and management of vascular access device: Secondary | ICD-10-CM | POA: Insufficient documentation

## 2023-06-08 DIAGNOSIS — R197 Diarrhea, unspecified: Secondary | ICD-10-CM | POA: Diagnosis not present

## 2023-06-08 DIAGNOSIS — C252 Malignant neoplasm of tail of pancreas: Secondary | ICD-10-CM | POA: Diagnosis not present

## 2023-06-08 DIAGNOSIS — B9561 Methicillin susceptible Staphylococcus aureus infection as the cause of diseases classified elsewhere: Secondary | ICD-10-CM | POA: Diagnosis not present

## 2023-06-08 DIAGNOSIS — R7881 Bacteremia: Secondary | ICD-10-CM | POA: Diagnosis not present

## 2023-06-08 MED ORDER — HEPARIN SOD (PORK) LOCK FLUSH 100 UNIT/ML IV SOLN
500.0000 [IU] | Freq: Once | INTRAVENOUS | Status: AC | PRN
  Administered 2023-06-08: 500 [IU]
  Filled 2023-06-08: qty 5

## 2023-06-08 MED ORDER — SODIUM CHLORIDE 0.9% FLUSH
10.0000 mL | INTRAVENOUS | Status: DC | PRN
  Administered 2023-06-08: 10 mL
  Filled 2023-06-08: qty 10

## 2023-06-08 NOTE — Progress Notes (Signed)
 [facility] CSW Progress Note  Clinical Child psychotherapist received a referral from RN to contact patient regarding advance directives.  Kathleen Russell and her husband started their advance directives previously and would like them notarized and reviewed.  CSW explained the process.  They are scheduled for an appointment at 11am on 6/3.    Kennth Peal, LCSW Clinical Social Worker [facility]

## 2023-06-09 ENCOUNTER — Inpatient Hospital Stay

## 2023-06-09 NOTE — Progress Notes (Signed)
 [facility] Healthcare Advance Directives Clinical Social Work  Patient presented to Advance Directives Clinic  to review and complete healthcare advance directives.  Clinical Social Worker met with patient and her husband, [name].  The patient designated Sidonie Dexheimer as their primary healthcare agent and Jacob Shearn as their secondary agent.  Patient also completed healthcare living will.    Documents were notarized and copies made for patient/family. Clinical Social Worker will send documents to medical records to be scanned into patient's chart. Clinical Social Worker encouraged patient/family to contact with any additional questions or concerns.   Kennth Peal, LCSW Clinical Social Worker [facility]

## 2023-06-11 ENCOUNTER — Encounter: Payer: Self-pay | Admitting: Oncology

## 2023-06-12 ENCOUNTER — Encounter: Payer: Self-pay | Admitting: Oncology

## 2023-06-17 ENCOUNTER — Emergency Department

## 2023-06-17 ENCOUNTER — Other Ambulatory Visit: Payer: Self-pay

## 2023-06-17 ENCOUNTER — Inpatient Hospital Stay
Admission: EM | Admit: 2023-06-17 | Discharge: 2023-06-24 | DRG: 314 | Disposition: A | Discharge status: Home Health | Attending: Obstetrics and Gynecology | Admitting: Obstetrics and Gynecology

## 2023-06-17 ENCOUNTER — Encounter: Payer: Self-pay | Admitting: *Deleted

## 2023-06-17 DIAGNOSIS — Z803 Family history of malignant neoplasm of breast: Secondary | ICD-10-CM

## 2023-06-17 DIAGNOSIS — R6510 Systemic inflammatory response syndrome (SIRS) of non-infectious origin without acute organ dysfunction: Secondary | ICD-10-CM | POA: Diagnosis present

## 2023-06-17 DIAGNOSIS — K219 Gastro-esophageal reflux disease without esophagitis: Secondary | ICD-10-CM | POA: Diagnosis present

## 2023-06-17 DIAGNOSIS — R509 Fever, unspecified: Secondary | ICD-10-CM | POA: Diagnosis not present

## 2023-06-17 DIAGNOSIS — C259 Malignant neoplasm of pancreas, unspecified: Secondary | ICD-10-CM

## 2023-06-17 DIAGNOSIS — Z8249 Family history of ischemic heart disease and other diseases of the circulatory system: Secondary | ICD-10-CM

## 2023-06-17 DIAGNOSIS — I7300 Raynaud's syndrome without gangrene: Secondary | ICD-10-CM | POA: Diagnosis present

## 2023-06-17 DIAGNOSIS — Z91040 Latex allergy status: Secondary | ICD-10-CM

## 2023-06-17 DIAGNOSIS — T827XXA Infection and inflammatory reaction due to other cardiac and vascular devices, implants and grafts, initial encounter: Secondary | ICD-10-CM | POA: Diagnosis not present

## 2023-06-17 DIAGNOSIS — T451X5A Adverse effect of antineoplastic and immunosuppressive drugs, initial encounter: Secondary | ICD-10-CM | POA: Diagnosis present

## 2023-06-17 DIAGNOSIS — R7881 Bacteremia: Secondary | ICD-10-CM

## 2023-06-17 DIAGNOSIS — E871 Hypo-osmolality and hyponatremia: Secondary | ICD-10-CM | POA: Diagnosis present

## 2023-06-17 DIAGNOSIS — T80211A Bloodstream infection due to central venous catheter, initial encounter: Secondary | ICD-10-CM | POA: Diagnosis not present

## 2023-06-17 DIAGNOSIS — I38 Endocarditis, valve unspecified: Secondary | ICD-10-CM | POA: Diagnosis not present

## 2023-06-17 DIAGNOSIS — E669 Obesity, unspecified: Secondary | ICD-10-CM | POA: Diagnosis present

## 2023-06-17 DIAGNOSIS — Z8616 Personal history of COVID-19: Secondary | ICD-10-CM

## 2023-06-17 DIAGNOSIS — Z85828 Personal history of other malignant neoplasm of skin: Secondary | ICD-10-CM

## 2023-06-17 DIAGNOSIS — E785 Hyperlipidemia, unspecified: Secondary | ICD-10-CM | POA: Diagnosis not present

## 2023-06-17 DIAGNOSIS — I340 Nonrheumatic mitral (valve) insufficiency: Secondary | ICD-10-CM | POA: Diagnosis not present

## 2023-06-17 DIAGNOSIS — E119 Type 2 diabetes mellitus without complications: Secondary | ICD-10-CM

## 2023-06-17 DIAGNOSIS — A4101 Sepsis due to Methicillin susceptible Staphylococcus aureus: Secondary | ICD-10-CM | POA: Diagnosis present

## 2023-06-17 DIAGNOSIS — R531 Weakness: Secondary | ICD-10-CM | POA: Diagnosis present

## 2023-06-17 DIAGNOSIS — C252 Malignant neoplasm of tail of pancreas: Secondary | ICD-10-CM | POA: Diagnosis present

## 2023-06-17 DIAGNOSIS — A0811 Acute gastroenteropathy due to Norwalk agent: Secondary | ICD-10-CM | POA: Diagnosis present

## 2023-06-17 DIAGNOSIS — R300 Dysuria: Principal | ICD-10-CM

## 2023-06-17 DIAGNOSIS — Z833 Family history of diabetes mellitus: Secondary | ICD-10-CM

## 2023-06-17 DIAGNOSIS — Z8261 Family history of arthritis: Secondary | ICD-10-CM

## 2023-06-17 DIAGNOSIS — E876 Hypokalemia: Secondary | ICD-10-CM | POA: Diagnosis present

## 2023-06-17 DIAGNOSIS — D61810 Antineoplastic chemotherapy induced pancytopenia: Secondary | ICD-10-CM | POA: Diagnosis present

## 2023-06-17 DIAGNOSIS — B9561 Methicillin susceptible Staphylococcus aureus infection as the cause of diseases classified elsewhere: Secondary | ICD-10-CM | POA: Diagnosis not present

## 2023-06-17 DIAGNOSIS — Z1152 Encounter for screening for COVID-19: Secondary | ICD-10-CM

## 2023-06-17 DIAGNOSIS — Z83438 Family history of other disorder of lipoprotein metabolism and other lipidemia: Secondary | ICD-10-CM

## 2023-06-17 DIAGNOSIS — Z85118 Personal history of other malignant neoplasm of bronchus and lung: Secondary | ICD-10-CM

## 2023-06-17 DIAGNOSIS — R197 Diarrhea, unspecified: Secondary | ICD-10-CM | POA: Diagnosis present

## 2023-06-17 DIAGNOSIS — Z9049 Acquired absence of other specified parts of digestive tract: Secondary | ICD-10-CM

## 2023-06-17 DIAGNOSIS — Z79899 Other long term (current) drug therapy: Secondary | ICD-10-CM

## 2023-06-17 DIAGNOSIS — Z7984 Long term (current) use of oral hypoglycemic drugs: Secondary | ICD-10-CM

## 2023-06-17 LAB — CBC WITH DIFFERENTIAL/PLATELET
Abs Immature Granulocytes: 0.01 10*3/uL (ref 0.00–0.07)
Basophils Absolute: 0 10*3/uL (ref 0.0–0.1)
Basophils Relative: 0 %
Eosinophils Absolute: 0 10*3/uL (ref 0.0–0.5)
Eosinophils Relative: 0 %
HCT: 39.1 % (ref 36.0–46.0)
Hemoglobin: 12.6 g/dL (ref 12.0–15.0)
Immature Granulocytes: 0 %
Lymphocytes Relative: 21 %
Lymphs Abs: 0.6 10*3/uL — ABNORMAL LOW (ref 0.7–4.0)
MCH: 28.1 pg (ref 26.0–34.0)
MCHC: 32.2 g/dL (ref 30.0–36.0)
MCV: 87.3 fL (ref 80.0–100.0)
Monocytes Absolute: 0.4 10*3/uL (ref 0.1–1.0)
Monocytes Relative: 12 %
Neutro Abs: 2 10*3/uL (ref 1.7–7.7)
Neutrophils Relative %: 67 %
Platelets: 151 10*3/uL (ref 150–400)
RBC: 4.48 MIL/uL (ref 3.87–5.11)
RDW: 14.6 % (ref 11.5–15.5)
WBC: 3.1 10*3/uL — ABNORMAL LOW (ref 4.0–10.5)
nRBC: 0 % (ref 0.0–0.2)

## 2023-06-17 LAB — COMPREHENSIVE METABOLIC PANEL WITH GFR
ALT: 12 U/L (ref 0–44)
AST: 14 U/L — ABNORMAL LOW (ref 15–41)
Albumin: 3 g/dL — ABNORMAL LOW (ref 3.5–5.0)
Alkaline Phosphatase: 56 U/L (ref 38–126)
Anion gap: 9 (ref 5–15)
BUN: 11 mg/dL (ref 8–23)
CO2: 23 mmol/L (ref 22–32)
Calcium: 8.3 mg/dL — ABNORMAL LOW (ref 8.9–10.3)
Chloride: 99 mmol/L (ref 98–111)
Creatinine, Ser: 0.7 mg/dL (ref 0.44–1.00)
GFR, Estimated: >60 mL/min (ref >60)
Glucose, Bld: 200 mg/dL — ABNORMAL HIGH (ref 70–99)
Potassium: 3.5 mmol/L (ref 3.5–5.1)
Sodium: 131 mmol/L — ABNORMAL LOW (ref 135–145)
Total Bilirubin: 0.5 mg/dL (ref 0.0–1.2)
Total Protein: 5.9 g/dL — ABNORMAL LOW (ref 6.5–8.1)

## 2023-06-17 LAB — LACTIC ACID, PLASMA: Lactic Acid, Venous: 1.5 mmol/L (ref 0.5–1.9)

## 2023-06-17 LAB — PROTIME-INR
INR: 1 (ref 0.8–1.2)
Prothrombin Time: 13.6 s (ref 11.4–15.2)

## 2023-06-17 LAB — RESP PANEL BY RT-PCR (RSV, FLU A&B, COVID)  RVPGX2
Influenza A by PCR: NEGATIVE
Influenza B by PCR: NEGATIVE
Resp Syncytial Virus by PCR: NEGATIVE
SARS Coronavirus 2 by RT PCR: NEGATIVE

## 2023-06-17 MED ORDER — CEFEPIME HCL 2 G IV SOLR
2.0000 g | Freq: Once | INTRAVENOUS | Status: AC
  Administered 2023-06-17: 2 g via INTRAVENOUS
  Filled 2023-06-17: qty 12.5

## 2023-06-17 MED ORDER — LIDOCAINE-EPINEPHRINE-TETRACAINE (LET) TOPICAL GEL
3.0000 mL | Freq: Once | TOPICAL | Status: DC
  Filled 2023-06-17: qty 3

## 2023-06-17 MED ORDER — SODIUM CHLORIDE 0.9 % IV BOLUS (SEPSIS)
1000.0000 mL | Freq: Once | INTRAVENOUS | Status: AC
  Administered 2023-06-17: 1000 mL via INTRAVENOUS

## 2023-06-17 MED ORDER — ACETAMINOPHEN 325 MG PO TABS
650.0000 mg | ORAL_TABLET | Freq: Once | ORAL | Status: AC
  Administered 2023-06-17: 650 mg via ORAL
  Filled 2023-06-17: qty 2

## 2023-06-17 NOTE — ED Provider Notes (Signed)
 [facility] Provider Note    Event Date/Time   First MD Initiated Contact with Patient 06/17/23 2110     (approximate)   History   Fever   HPI  Kathleen Russell is a 68 year old female with history of T2DM, pancreatic cancer on chemotherapy presenting to the emergency department for evaluation of fever.  Last chemotherapy on 5/30.  Has felt generally weak over the past few days, but otherwise without complaints.  Tonight, had onset of fever with a Tmax of 102.7 with associated dysuria.  No abdominal pain, vomiting.  Did not take any antipyretics prior to presentation.  No cough or congestion.     Physical Exam   Triage Vital Signs: ED Triage Vitals  Encounter Vitals Group     BP 06/17/23 2107 129/81     Systolic BP Percentile --      Diastolic BP Percentile --      Pulse Rate 06/17/23 2107 (!) 154     Resp 06/17/23 2107 (!) 22     Temp 06/17/23 2107 99.9 F (37.7 C)     Temp Source 06/17/23 2107 Oral     SpO2 06/17/23 2107 99 %     Weight 06/17/23 2108 165 lb (74.8 kg)     Height 06/17/23 2108 5' 4 (1.626 m)     Head Circumference --      Peak Flow --      Pain Score 06/17/23 2108 4     Pain Loc --      Pain Education --      Exclude from Growth Chart --     Most recent vital signs: Vitals:   06/17/23 2107  BP: 129/81  Pulse: (!) 154  Resp: (!) 22  Temp: 99.9 F (37.7 C)  SpO2: 99%     General: Awake, interactive  CV:  Tachycardic with regular rhythm, normal peripheral perfusion Resp:  Unlabored respirations, lungs clear to auscultation Abd:  Nondistended, soft, nontender Neuro:  Symmetric facial movement, fluid speech   ED Results / Procedures / Treatments   Labs (all labs ordered are listed, but only abnormal results are displayed) Labs Reviewed  CBC WITH DIFFERENTIAL/PLATELET - Abnormal; Notable for the following components:      Result Value   WBC 3.1 (*)    Lymphs Abs 0.6 (*)    All other components within normal  limits  COMPREHENSIVE METABOLIC PANEL WITH GFR - Abnormal; Notable for the following components:   Sodium 131 (*)    Glucose, Bld 200 (*)    Calcium  8.3 (*)    Total Protein 5.9 (*)    Albumin 3.0 (*)    AST 14 (*)    All other components within normal limits  CULTURE, BLOOD (ROUTINE X 2)  CULTURE, BLOOD (ROUTINE X 2)  RESP PANEL BY RT-PCR (RSV, FLU A&B, COVID)  [id number]  LACTIC ACID, PLASMA  PROTIME-INR  LACTIC ACID, PLASMA  CBC WITH DIFFERENTIAL/PLATELET  URINALYSIS, W/ REFLEX TO CULTURE (INFECTION SUSPECTED)     EKG EKG independently reviewed and interpreted by myself demonstrates:    RADIOLOGY Imaging independently reviewed and interpreted by myself demonstrates:  CXR without focal consolidation  Formal Radiology Read:  DG Chest 2 View Result Date: 06/17/2023 CLINICAL DATA:  Fever EXAM: CHEST - 2 VIEW COMPARISON:  02/17/2020 FINDINGS: Right-sided central venous catheter tip at the cavoatrial junction. No acute airspace disease, pleural effusion, or pneumothorax. Stable cardiomediastinal silhouette IMPRESSION: No active cardiopulmonary disease. Electronically Signed   By:  Esmeralda Hedge M.D.   On: 06/17/2023 21:40    PROCEDURES:  Critical Care performed: Yes, see critical care procedure note(s)  CRITICAL CARE Performed by: Claria Crofts   Total critical care time: 31 minutes  Critical care time was exclusive of separately billable procedures and treating other patients.  Critical care was necessary to treat or prevent imminent or life-threatening deterioration.  Critical care was time spent personally by me on the following activities: development of treatment plan with patient and/or surrogate as well as nursing, discussions with consultants, evaluation of patient's response to treatment, examination of patient, obtaining history from patient or surrogate, ordering and performing treatments and interventions, ordering and review of laboratory studies, ordering and review of  radiographic studies, pulse oximetry and re-evaluation of patient's condition.   Procedures   MEDICATIONS ORDERED IN ED: Medications  lidocaine -EPINEPHrine -tetracaine (LET) topical gel (has no administration in time range)  sodium chloride  0.9 % bolus 1,000 mL (1,000 mLs Intravenous New Bag/Given 06/17/23 2214)  ceFEPIme (MAXIPIME) 2 g in sodium chloride  0.9 % 100 mL IVPB (0 g Intravenous Stopped 06/17/23 2250)  acetaminophen  (TYLENOL ) tablet 650 mg (650 mg Oral Given 06/17/23 2223)     IMPRESSION / MDM / ASSESSMENT AND PLAN / ED COURSE  I reviewed the triage vital signs and the nursing notes.  Differential diagnosis includes, but is not limited to, UTI, pneumonia, other infectious source, anemia, electrolyte abnormality, dehydration  Patient's presentation is most consistent with acute presentation with potential threat to life or bodily function.  68 year old female presenting with fever and dysuria.  Borderline febrile on presentation with tachycardia, tachypnea.  Sepsis orders initiated with empiric cefepime in the setting of immunocompromise status.  Signed out to oncoming physician pending completion of workup, likely admission.    FINAL CLINICAL IMPRESSION(S) / ED DIAGNOSES   Final diagnoses:  Dysuria  Fever, unspecified fever cause     Rx / DC Orders   ED Discharge Orders     None        Note:  This document was prepared using Dragon voice recognition software and may include unintentional dictation errors.   Claria Crofts, MD 06/17/23 [phone]

## 2023-06-17 NOTE — Progress Notes (Signed)
 CODE SEPSIS - PHARMACY COMMUNICATION  **Broad Spectrum Antibiotics should be administered within 1 hour of Sepsis diagnosis**  Time Code Sepsis Called/Page Received: 2133  Antibiotics Ordered: Cefepime  Time of 1st antibiotic administration: 2216  Coretta Dexter, PharmD, [facility] 06/17/2023 9:41 PM

## 2023-06-17 NOTE — ED Triage Notes (Signed)
 Pt ambulatory to triage.  Pt reports a fever.  Pt has has dysuria.  Sx began today.  Hx pancreatic cancer.  Pt has a port.  Pt alert.

## 2023-06-17 NOTE — Sepsis Progress Note (Signed)
 Elink monitoring for the code sepsis protocol.

## 2023-06-17 NOTE — ED Notes (Signed)
 Pt taken to room 15 via wheelchair.

## 2023-06-18 ENCOUNTER — Encounter: Payer: Self-pay | Admitting: Student

## 2023-06-18 DIAGNOSIS — C259 Malignant neoplasm of pancreas, unspecified: Secondary | ICD-10-CM

## 2023-06-18 DIAGNOSIS — Z85828 Personal history of other malignant neoplasm of skin: Secondary | ICD-10-CM | POA: Diagnosis not present

## 2023-06-18 DIAGNOSIS — C252 Malignant neoplasm of tail of pancreas: Secondary | ICD-10-CM | POA: Diagnosis not present

## 2023-06-18 DIAGNOSIS — Z8261 Family history of arthritis: Secondary | ICD-10-CM | POA: Diagnosis not present

## 2023-06-18 DIAGNOSIS — E669 Obesity, unspecified: Secondary | ICD-10-CM | POA: Diagnosis not present

## 2023-06-18 DIAGNOSIS — A4101 Sepsis due to Methicillin susceptible Staphylococcus aureus: Secondary | ICD-10-CM | POA: Diagnosis not present

## 2023-06-18 DIAGNOSIS — Z8249 Family history of ischemic heart disease and other diseases of the circulatory system: Secondary | ICD-10-CM | POA: Diagnosis not present

## 2023-06-18 DIAGNOSIS — Z83438 Family history of other disorder of lipoprotein metabolism and other lipidemia: Secondary | ICD-10-CM | POA: Diagnosis not present

## 2023-06-18 DIAGNOSIS — R300 Dysuria: Secondary | ICD-10-CM | POA: Diagnosis present

## 2023-06-18 DIAGNOSIS — E119 Type 2 diabetes mellitus without complications: Secondary | ICD-10-CM | POA: Diagnosis not present

## 2023-06-18 DIAGNOSIS — R531 Weakness: Secondary | ICD-10-CM | POA: Diagnosis not present

## 2023-06-18 DIAGNOSIS — I38 Endocarditis, valve unspecified: Secondary | ICD-10-CM | POA: Diagnosis not present

## 2023-06-18 DIAGNOSIS — E871 Hypo-osmolality and hyponatremia: Secondary | ICD-10-CM | POA: Diagnosis not present

## 2023-06-18 DIAGNOSIS — A0811 Acute gastroenteropathy due to Norwalk agent: Secondary | ICD-10-CM | POA: Diagnosis not present

## 2023-06-18 DIAGNOSIS — Z8616 Personal history of COVID-19: Secondary | ICD-10-CM | POA: Diagnosis not present

## 2023-06-18 DIAGNOSIS — Z1152 Encounter for screening for COVID-19: Secondary | ICD-10-CM | POA: Diagnosis not present

## 2023-06-18 DIAGNOSIS — T451X5A Adverse effect of antineoplastic and immunosuppressive drugs, initial encounter: Secondary | ICD-10-CM | POA: Diagnosis not present

## 2023-06-18 DIAGNOSIS — D61810 Antineoplastic chemotherapy induced pancytopenia: Secondary | ICD-10-CM | POA: Diagnosis not present

## 2023-06-18 DIAGNOSIS — Z7984 Long term (current) use of oral hypoglycemic drugs: Secondary | ICD-10-CM | POA: Diagnosis not present

## 2023-06-18 DIAGNOSIS — Z91040 Latex allergy status: Secondary | ICD-10-CM | POA: Diagnosis not present

## 2023-06-18 DIAGNOSIS — R6510 Systemic inflammatory response syndrome (SIRS) of non-infectious origin without acute organ dysfunction: Secondary | ICD-10-CM | POA: Diagnosis present

## 2023-06-18 DIAGNOSIS — E785 Hyperlipidemia, unspecified: Secondary | ICD-10-CM

## 2023-06-18 DIAGNOSIS — Z833 Family history of diabetes mellitus: Secondary | ICD-10-CM | POA: Diagnosis not present

## 2023-06-18 DIAGNOSIS — R7881 Bacteremia: Secondary | ICD-10-CM | POA: Diagnosis not present

## 2023-06-18 DIAGNOSIS — I340 Nonrheumatic mitral (valve) insufficiency: Secondary | ICD-10-CM | POA: Diagnosis not present

## 2023-06-18 DIAGNOSIS — R197 Diarrhea, unspecified: Secondary | ICD-10-CM | POA: Diagnosis not present

## 2023-06-18 DIAGNOSIS — T80211A Bloodstream infection due to central venous catheter, initial encounter: Secondary | ICD-10-CM | POA: Diagnosis not present

## 2023-06-18 DIAGNOSIS — E876 Hypokalemia: Secondary | ICD-10-CM | POA: Diagnosis not present

## 2023-06-18 DIAGNOSIS — B9561 Methicillin susceptible Staphylococcus aureus infection as the cause of diseases classified elsewhere: Secondary | ICD-10-CM | POA: Diagnosis not present

## 2023-06-18 HISTORY — DX: Systemic inflammatory response syndrome (sirs) of non-infectious origin without acute organ dysfunction: R65.10

## 2023-06-18 LAB — URINALYSIS, W/ REFLEX TO CULTURE (INFECTION SUSPECTED)
Bacteria, UA: NONE SEEN
Bilirubin Urine: NEGATIVE
Glucose, UA: 50 mg/dL — AB
Ketones, ur: NEGATIVE mg/dL
Leukocytes,Ua: NEGATIVE
Nitrite: NEGATIVE
Protein, ur: NEGATIVE mg/dL
Specific Gravity, Urine: 1.013 (ref 1.005–1.030)
pH: 6 (ref 5.0–8.0)

## 2023-06-18 LAB — BASIC METABOLIC PANEL WITH GFR
Anion gap: 8 (ref 5–15)
BUN: 8 mg/dL (ref 8–23)
CO2: 22 mmol/L (ref 22–32)
Calcium: 7.6 mg/dL — ABNORMAL LOW (ref 8.9–10.3)
Chloride: 103 mmol/L (ref 98–111)
Creatinine, Ser: 0.53 mg/dL (ref 0.44–1.00)
GFR, Estimated: >60 mL/min (ref >60)
Glucose, Bld: 212 mg/dL — ABNORMAL HIGH (ref 70–99)
Potassium: 3 mmol/L — ABNORMAL LOW (ref 3.5–5.1)
Sodium: 133 mmol/L — ABNORMAL LOW (ref 135–145)

## 2023-06-18 LAB — BLOOD CULTURE ID PANEL (REFLEXED) - BCID2

## 2023-06-18 LAB — TSH: TSH: 2.506 u[IU]/mL (ref 0.350–4.500)

## 2023-06-18 LAB — CBC
HCT: 36.2 % (ref 36.0–46.0)
Hemoglobin: 11.7 g/dL — ABNORMAL LOW (ref 12.0–15.0)
MCH: 29 pg (ref 26.0–34.0)
MCHC: 32.3 g/dL (ref 30.0–36.0)
MCV: 89.6 fL (ref 80.0–100.0)
Platelets: 114 10*3/uL — ABNORMAL LOW (ref 150–400)
RBC: 4.04 MIL/uL (ref 3.87–5.11)
RDW: 14.6 % (ref 11.5–15.5)
WBC: 2.9 10*3/uL — ABNORMAL LOW (ref 4.0–10.5)
nRBC: 0 % (ref 0.0–0.2)

## 2023-06-18 LAB — CBG MONITORING, ED: Glucose-Capillary: 199 mg/dL — ABNORMAL HIGH (ref 70–99)

## 2023-06-18 LAB — HIV ANTIBODY (ROUTINE TESTING W REFLEX): HIV Screen 4th Generation wRfx: NONREACTIVE

## 2023-06-18 LAB — MAGNESIUM: Magnesium: 1.6 mg/dL — ABNORMAL LOW (ref 1.7–2.4)

## 2023-06-18 LAB — GLUCOSE, CAPILLARY
Glucose-Capillary: 155 mg/dL — ABNORMAL HIGH (ref 70–99)
Glucose-Capillary: 101 mg/dL — ABNORMAL HIGH (ref 70–99)
Glucose-Capillary: 149 mg/dL — ABNORMAL HIGH (ref 70–99)

## 2023-06-18 LAB — CORTISOL-AM, BLOOD: Cortisol - AM: 12.3 ug/dL (ref 6.7–22.6)

## 2023-06-18 LAB — PHOSPHORUS: Phosphorus: 2.2 mg/dL — ABNORMAL LOW (ref 2.5–4.6)

## 2023-06-18 LAB — LACTIC ACID, PLASMA: Lactic Acid, Venous: 1 mmol/L (ref 0.5–1.9)

## 2023-06-18 LAB — PROTIME-INR
INR: 1.2 (ref 0.8–1.2)
Prothrombin Time: 15.1 s (ref 11.4–15.2)

## 2023-06-18 LAB — T4, FREE: Free T4: 0.85 ng/dL (ref 0.61–1.12)

## 2023-06-18 MED ORDER — VANCOMYCIN HCL 1750 MG/350ML IV SOLN
1750.0000 mg | Freq: Once | INTRAVENOUS | Status: AC
  Administered 2023-06-18: 1750 mg via INTRAVENOUS
  Filled 2023-06-18: qty 350

## 2023-06-18 MED ORDER — SACCHAROMYCES BOULARDII 250 MG PO CAPS
250.0000 mg | ORAL_CAPSULE | Freq: Two times a day (BID) | ORAL | Status: DC
  Administered 2023-06-18 – 2023-06-24 (×11): 250 mg via ORAL
  Filled 2023-06-18 (×13): qty 1

## 2023-06-18 MED ORDER — SODIUM CHLORIDE 0.9% FLUSH
10.0000 mL | Freq: Two times a day (BID) | INTRAVENOUS | Status: DC
  Administered 2023-06-18: 20 mL
  Administered 2023-06-18 – 2023-06-21 (×6): 10 mL

## 2023-06-18 MED ORDER — LACTATED RINGERS IV SOLN
150.0000 mL/h | INTRAVENOUS | Status: AC
  Administered 2023-06-18 (×2): 150 mL/h via INTRAVENOUS

## 2023-06-18 MED ORDER — ENSURE PLUS HIGH PROTEIN PO LIQD
237.0000 mL | Freq: Two times a day (BID) | ORAL | Status: DC
  Administered 2023-06-18 – 2023-06-24 (×8): 237 mL via ORAL

## 2023-06-18 MED ORDER — LOPERAMIDE HCL 2 MG PO CAPS: 2.0000 mg | ORAL_CAPSULE | ORAL | Status: DC | PRN

## 2023-06-18 MED ORDER — LINAGLIPTIN 5 MG PO TABS
5.0000 mg | ORAL_TABLET | Freq: Every day | ORAL | Status: DC
  Administered 2023-06-18: 5 mg via ORAL
  Filled 2023-06-18 (×3): qty 1

## 2023-06-18 MED ORDER — MAGNESIUM HYDROXIDE 400 MG/5ML PO SUSP: 30.0000 mL | Freq: Every day | ORAL | Status: DC | PRN

## 2023-06-18 MED ORDER — VANCOMYCIN HCL 1500 MG/300ML IV SOLN: 1500.0000 mg | INTRAVENOUS | Status: DC

## 2023-06-18 MED ORDER — SODIUM CHLORIDE 0.9 % IV SOLN: 2.0000 g | Freq: Once | INTRAVENOUS | Status: DC

## 2023-06-18 MED ORDER — CHLORHEXIDINE GLUCONATE CLOTH 2 % EX PADS
6.0000 | MEDICATED_PAD | Freq: Every day | CUTANEOUS | Status: DC
  Administered 2023-06-18 – 2023-06-24 (×6): 6 via TOPICAL

## 2023-06-18 MED ORDER — METRONIDAZOLE 500 MG/100ML IV SOLN
500.0000 mg | Freq: Two times a day (BID) | INTRAVENOUS | Status: DC
  Administered 2023-06-18: 500 mg via INTRAVENOUS
  Filled 2023-06-18 (×2): qty 100

## 2023-06-18 MED ORDER — METRONIDAZOLE 500 MG/100ML IV SOLN: 500.0000 mg | Freq: Two times a day (BID) | INTRAVENOUS | Status: DC

## 2023-06-18 MED ORDER — ALBUTEROL SULFATE (2.5 MG/3ML) 0.083% IN NEBU: 2.5000 mg | INHALATION_SOLUTION | Freq: Four times a day (QID) | RESPIRATORY_TRACT | Status: DC | PRN

## 2023-06-18 MED ORDER — ACETAMINOPHEN 650 MG RE SUPP: 650.0000 mg | Freq: Four times a day (QID) | RECTAL | Status: DC | PRN

## 2023-06-18 MED ORDER — IBUPROFEN 400 MG PO TABS: 400.0000 mg | ORAL_TABLET | Freq: Once | ORAL | Status: DC

## 2023-06-18 MED ORDER — CETIRIZINE HCL 10 MG PO TABS
10.0000 mg | ORAL_TABLET | Freq: Every evening | ORAL | Status: DC
  Administered 2023-06-18 – 2023-06-23 (×6): 10 mg via ORAL
  Filled 2023-06-18 (×7): qty 1

## 2023-06-18 MED ORDER — SODIUM CHLORIDE 0.9 % IV SOLN
2.0000 g | Freq: Three times a day (TID) | INTRAVENOUS | Status: DC
  Administered 2023-06-18 (×2): 2 g via INTRAVENOUS
  Filled 2023-06-18 (×2): qty 12.5

## 2023-06-18 MED ORDER — ALBUTEROL SULFATE HFA 108 (90 BASE) MCG/ACT IN AERS: 2.0000 | INHALATION_SPRAY | Freq: Four times a day (QID) | RESPIRATORY_TRACT | Status: DC | PRN

## 2023-06-18 MED ORDER — ACETAMINOPHEN 325 MG PO TABS
650.0000 mg | ORAL_TABLET | Freq: Four times a day (QID) | ORAL | Status: DC | PRN
  Administered 2023-06-18 – 2023-06-19 (×2): 650 mg via ORAL
  Filled 2023-06-18 (×3): qty 2

## 2023-06-18 MED ORDER — ENOXAPARIN SODIUM 40 MG/0.4ML IJ SOSY
40.0000 mg | PREFILLED_SYRINGE | INTRAMUSCULAR | Status: DC
  Administered 2023-06-18: 40 mg via SUBCUTANEOUS
  Filled 2023-06-18: qty 0.4

## 2023-06-18 MED ORDER — ONDANSETRON HCL 4 MG/2ML IJ SOLN: 4.0000 mg | Freq: Four times a day (QID) | INTRAMUSCULAR | Status: DC | PRN

## 2023-06-18 MED ORDER — TRAZODONE HCL 50 MG PO TABS
25.0000 mg | ORAL_TABLET | Freq: Every evening | ORAL | Status: DC | PRN
  Filled 2023-06-18: qty 1

## 2023-06-18 MED ORDER — MAGNESIUM SULFATE 2 GM/50ML IV SOLN
2.0000 g | Freq: Once | INTRAVENOUS | Status: AC
  Administered 2023-06-18: 2 g via INTRAVENOUS
  Filled 2023-06-18: qty 50

## 2023-06-18 MED ORDER — POTASSIUM PHOSPHATES 15 MMOLE/5ML IV SOLN
30.0000 mmol | Freq: Once | INTRAVENOUS | Status: AC
  Administered 2023-06-18: 30 mmol via INTRAVENOUS
  Filled 2023-06-18: qty 10

## 2023-06-18 MED ORDER — SODIUM CHLORIDE 0.9% FLUSH: 10.0000 mL | INTRAVENOUS | Status: DC | PRN

## 2023-06-18 MED ORDER — DILTIAZEM HCL 25 MG/5ML IV SOLN
5.0000 mg | Freq: Once | INTRAVENOUS | Status: AC
  Administered 2023-06-18: 5 mg via INTRAVENOUS
  Filled 2023-06-18: qty 5

## 2023-06-18 MED ORDER — INSULIN ASPART 100 UNIT/ML IJ SOLN
0.0000 [IU] | Freq: Three times a day (TID) | INTRAMUSCULAR | Status: DC
  Administered 2023-06-18: 3 [IU] via SUBCUTANEOUS
  Administered 2023-06-19 (×2): 2 [IU] via SUBCUTANEOUS
  Administered 2023-06-20: 3 [IU] via SUBCUTANEOUS
  Administered 2023-06-20: 8 [IU] via SUBCUTANEOUS
  Administered 2023-06-21: 5 [IU] via SUBCUTANEOUS
  Administered 2023-06-21 – 2023-06-23 (×3): 3 [IU] via SUBCUTANEOUS
  Administered 2023-06-23: 2 [IU] via SUBCUTANEOUS
  Administered 2023-06-24: 3 [IU] via SUBCUTANEOUS
  Administered 2023-06-24: 2 [IU] via SUBCUTANEOUS
  Filled 2023-06-18 (×15): qty 1

## 2023-06-18 MED ORDER — ROSUVASTATIN CALCIUM 20 MG PO TABS
20.0000 mg | ORAL_TABLET | Freq: Every day | ORAL | Status: DC
  Administered 2023-06-18 – 2023-06-24 (×7): 20 mg via ORAL
  Filled 2023-06-18 (×8): qty 1

## 2023-06-18 MED ORDER — FLUTICASONE PROPIONATE 50 MCG/ACT NA SUSP: 2.0000 | Freq: Every evening | NASAL | Status: DC | PRN

## 2023-06-18 MED ORDER — PANTOPRAZOLE SODIUM 40 MG PO TBEC
40.0000 mg | DELAYED_RELEASE_TABLET | Freq: Every day | ORAL | Status: DC
  Administered 2023-06-18 – 2023-06-24 (×7): 40 mg via ORAL
  Filled 2023-06-18 (×8): qty 1

## 2023-06-18 MED ORDER — HYDROXYZINE HCL 50 MG PO TABS
25.0000 mg | ORAL_TABLET | Freq: Once | ORAL | Status: AC
  Administered 2023-06-18: 25 mg via ORAL
  Filled 2023-06-18: qty 1

## 2023-06-18 MED ORDER — ONDANSETRON HCL 4 MG PO TABS: 4.0000 mg | ORAL_TABLET | Freq: Four times a day (QID) | ORAL | Status: DC | PRN

## 2023-06-18 MED ORDER — CEFAZOLIN SODIUM-DEXTROSE 2-4 GM/100ML-% IV SOLN
2.0000 g | Freq: Three times a day (TID) | INTRAVENOUS | Status: DC
  Administered 2023-06-18 – 2023-06-24 (×15): 2 g via INTRAVENOUS
  Filled 2023-06-18 (×18): qty 100

## 2023-06-18 MED ORDER — POTASSIUM CHLORIDE CRYS ER 20 MEQ PO TBCR
40.0000 meq | EXTENDED_RELEASE_TABLET | Freq: Once | ORAL | Status: AC
  Administered 2023-06-18: 40 meq via ORAL
  Filled 2023-06-18: qty 2

## 2023-06-18 MED FILL — Fosaprepitant Dimeglumine For IV Infusion 150 MG (Base Eq): INTRAVENOUS | Qty: 5 | Status: AC

## 2023-06-18 NOTE — Assessment & Plan Note (Signed)
-   The patient will be placed on supplemental coverage with NovoLog. - Will continue Januvia .

## 2023-06-18 NOTE — Assessment & Plan Note (Signed)
-   The patient will be admitted to a medical telemetry observation bed. - Will continue broad-spectrum IV antibiotic therapy with IV cefepime, vancomycin and Flagyl . - Will continue hydration with IV lactated ringer . - Will follow blood cultures. - Her SIRS is based on fever, tachycardia and tachypnea with leukopenia.

## 2023-06-18 NOTE — Consult Note (Signed)
 PHARMACY - PHYSICIAN COMMUNICATION CRITICAL VALUE ALERT - BLOOD CULTURE IDENTIFICATION (BCID)  Kathleen Russell is an 68 y.o. female who presented to [facility] on 06/17/2023 with a chief complaint of fever.   Assessment: 2/4 bottles in same set with GPCs; BCID detected MSSA  Name of physician (or Provider) Contacted: Dr. Hubert Madden & Dr. Zelda Hickman   Current antibiotics: Vancomycin, cefepime, metronidazole    Changes to prescribed antibiotics recommended:  De-escalate to cefazolin - Recommendations accepted by provider   Results for orders placed or performed during the hospital encounter of 06/17/23  Blood Culture ID Panel (Reflexed) (Collected: 06/17/2023 10:17 PM)  Result Value Ref Range   Enterococcus faecalis NOT DETECTED NOT DETECTED   Enterococcus Faecium NOT DETECTED NOT DETECTED   Listeria monocytogenes NOT DETECTED NOT DETECTED   Staphylococcus species DETECTED (A) NOT DETECTED   Staphylococcus aureus (BCID) DETECTED (A) NOT DETECTED   Staphylococcus epidermidis NOT DETECTED NOT DETECTED   Staphylococcus lugdunensis NOT DETECTED NOT DETECTED   Streptococcus species NOT DETECTED NOT DETECTED   Streptococcus agalactiae NOT DETECTED NOT DETECTED   Streptococcus pneumoniae NOT DETECTED NOT DETECTED   Streptococcus pyogenes NOT DETECTED NOT DETECTED   A.calcoaceticus-baumannii NOT DETECTED NOT DETECTED   Bacteroides fragilis NOT DETECTED NOT DETECTED   Enterobacterales NOT DETECTED NOT DETECTED   Enterobacter cloacae complex NOT DETECTED NOT DETECTED   Escherichia coli NOT DETECTED NOT DETECTED   Klebsiella aerogenes NOT DETECTED NOT DETECTED   Klebsiella oxytoca NOT DETECTED NOT DETECTED   Klebsiella pneumoniae NOT DETECTED NOT DETECTED   Proteus species NOT DETECTED NOT DETECTED   Salmonella species NOT DETECTED NOT DETECTED   Serratia marcescens NOT DETECTED NOT DETECTED   Haemophilus influenzae NOT DETECTED NOT DETECTED   Neisseria meningitidis NOT DETECTED NOT DETECTED    Pseudomonas aeruginosa NOT DETECTED NOT DETECTED   Stenotrophomonas maltophilia NOT DETECTED NOT DETECTED   Candida albicans NOT DETECTED NOT DETECTED   Candida auris NOT DETECTED NOT DETECTED   Candida glabrata NOT DETECTED NOT DETECTED   Candida krusei NOT DETECTED NOT DETECTED   Candida parapsilosis NOT DETECTED NOT DETECTED   Candida tropicalis NOT DETECTED NOT DETECTED   Cryptococcus neoformans/gattii NOT DETECTED NOT DETECTED   Meth resistant mecA/C and MREJ NOT DETECTED NOT DETECTED   Pansy Bogus, PharmD Pharmacy Resident  06/18/2023 2:27 PM

## 2023-06-18 NOTE — Progress Notes (Signed)
 Pharmacy Antibiotic Note  Kathleen Russell is a 69 y.o. female admitted on 06/17/2023 with possible infection of unknown source.  Pharmacy has been consulted for Vancomycin dosing for 7 days.  Plan: Pt given Vancomycin 1750 mg once. Vancomycin 1500 mg IV Q 24 hrs. Goal AUC 400-550. Expected AUC: 467.9 SCr used: 0.7  Pharmacy will continue to follow and will adjust abx dosing whenever warranted.  Temp (24hrs), Avg:99.4 F (37.4 C), Min:98.8 F (37.1 C), Max:99.9 F (37.7 C)   Recent Labs  Lab 06/17/23 2212 06/17/23 2227 06/17/23 2346  WBC  --  3.1*  --   CREATININE  --  0.70  --   LATICACIDVEN 1.5  --  1.0    Estimated Creatinine Clearance: 66.6 mL/min (by C-G formula based on SCr of 0.7 mg/dL).    Allergies  Allergen Reactions   Latex Itching and Rash    Antimicrobials this admission: 6/11 Cefepime >> x 7 days 6/12 Flagyl  >> x 7 days 6/12 Vancomycin >> x 7 days  Microbiology results: 6/11 BCx: Pending 6/11 UCx: Pending   Thank you for allowing pharmacy to be a part of this patient's care.  Coretta Dexter, PharmD, [facility] 06/18/2023 2:41 AM

## 2023-06-18 NOTE — Progress Notes (Signed)
 Patient is alert and oriented X 4. Her heart rate goes up to 150. No any other symptoms noted.MD made aware. Gave diltiazem 5 mg as order .plan of care ongoing.

## 2023-06-18 NOTE — ED Notes (Signed)
 Bed assigned
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
## 2023-06-18 NOTE — Progress Notes (Addendum)
 [facility] Progress Note  Patient: Kathleen Russell    ZOX:[id number]  DOA: 06/17/2023     Date of Service: the patient was seen and examined on 06/18/2023  Chief Complaint  Patient presents with   Fever   Brief hospital course: Kathleen Russell is a 68 y.o. Caucasian female with medical history significant for pancreatic cancer on chemotherapy proceeding with surgical intervention, GERD, allergic rhinitis, dyslipidemia and type 2 diabetes mellitus as well as Raynaud's phenomenon who presented to the emergency room with acute onset of fever that was up to 102.7 with associated chills that started around 7:30 PM.  The patient admitted to dysuria without urinary frequency or urgency or hematuria or flank pain.  She admitted to nausea and vomiting since yesterday morning.  No chest pain or palpitations.  No cough or wheezing or dyspnea.  No rhinorrhea or nasal congestion or sore throat or earache.  No bleeding diathesis.   ED Course: When the patient came to the ER, temperature was 99.9 with heart rate of 154 and respiratory rate of 22.  Heart rate was later 108.Labs revealed mild hyponatremia 131 and blood glucose of 200.  Actiq  acid was 1.5 later 1.  CBC showed mild leukopenia of 3.1 with ANC of 2 and lymphopenia.  Respiratory panel came back negative.  UA was negative. EKG as reviewed by me : EKG showed sinus tachycardia with a rate of 120 with low voltage QRS and inferior Q waves. Imaging: 2 view chest x-ray showed no acute cardiopulmonary disease.   The patient was given 650 mg of p.o. Tylenol  and 1 L bolus of IV normal saline as well as 2 g of IV cefepime, Flagyl  and vancomycin.  The patient will be admitted to a medical telemetry observation bed for further evaluation and management.   Assessment and Plan:  MSSA bacteremia, unknown source of infection S/p cefepime, vancomycin and Flagyl  Blood culture 2/4 growing MSSA, follow sensitivity report ID consulted, recommended cefazolin 2 g  IV every 8 hourly Follow TTE to rule out endocarditis Follow repeat blood cultures Remove Port-A-Cath IR consulted to remove Port-A-Cath most likely tomorrow a.m., keep n.p.o. after midnight  Sinus tachycardia, unknown cause could be due to fever versus anxiety TSH and free T4 level within normal range Cardizem 5 mg IV one-time dose given Continue monitoring telemetry  Diarrhea, follow-up C. difficile and GI pathogen Started probiotics Continue oral hydration  Hypokalemia, potassium repleted. Hypophosphatemia, Phos repleted. Hypomagnesemia, mag repleted. Monitor electrolytes and replete as needed.   Pancreatic cancer on chemotherapy Due to bacteremia Chemo-Port needs to be removed, informed to oncologist Dr. Randy Buttery, she agreed with removal, patient will need reinsertion which will be done as an outpatient  Diabetes mellitus type 2 Continue Tradjenta 5 mg p.o. daily and started NovoLog sliding scale Monitor CBG, continue diabetic diet   HLD, continue statin   Body mass index is 28.32 kg/m.  Interventions:  Diet: Carb modified diet DVT Prophylaxis: Subcutaneous Lovenox    Advance goals of care discussion: Full code  Family Communication: family was not present at bedside, at the time of interview.  The pt provided permission to discuss medical plan with the family. Opportunity was given to ask question and all questions were answered satisfactorily.   Disposition:  Pt is from Home, admitted with bacteremia, still on IV antibiotics, which precludes a safe discharge. Discharge to home, when stable and cleared by ID.  Subjective: No significant events overnight, patient had mild dysuria, no BM for past 2 to  3 days, denied any other complaints.  Physical Exam: General: NAD, lying comfortably Appear in no distress, affect appropriate Eyes: PERRLA ENT: Oral Mucosa Clear, moist  Neck: no JVD,  Cardiovascular: S1 and S2 Present, no Murmur,  Respiratory: good respiratory  effort, Bilateral Air entry equal and Decreased, no Crackles, no wheezes Abdomen: Bowel Sound present, Soft and no tenderness,  Skin: no rashes Extremities: no Pedal edema, no calf tenderness Neurologic: without any new focal findings Gait not checked due to patient safety concerns  Vitals:   06/18/23 0447 06/18/23 0800 06/18/23 1003 06/18/23 1140  BP:  129/79 123/73 119/68  Pulse:  (!) 124 60 (!) 119  Resp:  (!) 23 16 18   Temp: 99.8 F (37.7 C)  99.4 F (37.4 C) (!) 101.3 F (38.5 C)  TempSrc: Oral   Oral  SpO2:  96% 97% 99%  Weight:      Height:        Intake/Output Summary (Last 24 hours) at 06/18/2023 1205 Last data filed at 06/18/2023 0448 Gross per 24 hour  Intake 1649.29 ml  Output --  Net 1649.29 ml   Filed Weights   06/17/23 2108  Weight: 74.8 kg    Data Reviewed: I have personally reviewed and interpreted daily labs, tele strips, imagings as discussed above. I reviewed all nursing notes, pharmacy notes, vitals, pertinent old records I have discussed plan of care as described above with RN and patient/family.  CBC: Recent Labs  Lab 06/17/23 2227 06/18/23 0259  WBC 3.1* 2.9*  NEUTROABS 2.0  --   HGB 12.6 11.7*  HCT 39.1 36.2  MCV 87.3 89.6  PLT 151 114*   Basic Metabolic Panel: Recent Labs  Lab 06/17/23 2227 06/18/23 0935  NA 131* 133*  K 3.5 3.0*  CL 99 103  CO2 23 22  GLUCOSE 200* 212*  BUN 11 8  CREATININE 0.70 0.53  CALCIUM  8.3* 7.6*  MG  --  1.6*  PHOS  --  2.2*    Studies: DG Chest 2 View Result Date: 06/17/2023 CLINICAL DATA:  Fever EXAM: CHEST - 2 VIEW COMPARISON:  02/17/2020 FINDINGS: Right-sided central venous catheter tip at the cavoatrial junction. No acute airspace disease, pleural effusion, or pneumothorax. Stable cardiomediastinal silhouette IMPRESSION: No active cardiopulmonary disease. Electronically Signed   By: Esmeralda Hedge M.D.   On: 06/17/2023 21:40    Scheduled Meds:  cetirizine  10 mg Oral QPM   enoxaparin   (LOVENOX ) injection  40 mg Subcutaneous Q24H   ibuprofen  400 mg Oral Once   insulin aspart  0-15 Units Subcutaneous TID WC   lidocaine -EPINEPHrine -tetracaine  3 mL Topical Once   linagliptin  5 mg Oral Daily   pantoprazole   40 mg Oral Daily   rosuvastatin   20 mg Oral Daily   Continuous Infusions:  ceFEPime (MAXIPIME) IV Stopped (06/18/23 1610)   lactated ringers  150 mL/hr (06/18/23 1104)   magnesium  sulfate bolus IVPB 2 g (06/18/23 1141)   metronidazole  Stopped (06/18/23 0617)   potassium PHOSPHATE IVPB (in mmol)     [START ON 06/19/2023] vancomycin     PRN Meds: acetaminophen  **OR** acetaminophen , albuterol , fluticasone , loperamide , magnesium  hydroxide, ondansetron  **OR** ondansetron  (ZOFRAN ) IV, traZODone  Time spent: 55 minutes  Author: Althia Atlas. MD [facility] 06/18/2023 12:05 PM  To reach On-call, see care teams to locate the attending and reach out to them via [email or web address]. If 7PM-7AM, please contact night-coverage If you still have difficulty reaching the attending provider, please page the [facility] (Director on Call)  for [facility] on amion for assistance.

## 2023-06-18 NOTE — ED Notes (Signed)
Hospitalist at bedside speaking with pt at this time

## 2023-06-18 NOTE — Progress Notes (Signed)
   06/18/23 1140  Assess: MEWS Score  Temp (!) 101.3 F (38.5 C)  BP 119/68  MAP (mmHg) 83  Pulse Rate (!) 119  Resp 18  SpO2 99 %  O2 Device Room Air  Assess: MEWS Score  MEWS Temp 1  MEWS Systolic 0  MEWS Pulse 2  MEWS RR 0  MEWS LOC 0  MEWS Score 3  MEWS Score Color Yellow  Assess: if the MEWS score is Yellow or Red  Were vital signs accurate and taken at a resting state? Yes  Does the patient meet 2 or more of the SIRS criteria? No  MEWS guidelines implemented  Yes, yellow  Treat  MEWS Interventions Considered administering scheduled or prn medications/treatments as ordered  Take Vital Signs  Increase Vital Sign Frequency  Yellow: Q2hr x1, continue Q4hrs until patient remains green for 12hrs  Escalate  MEWS: Escalate Yellow: Discuss with charge nurse and consider notifying provider and/or RRT  Notify: Charge Nurse/RN  Name of Charge Nurse/RN Notified RN [facility]  Provider Notification  Provider Name/Title Althia Atlas MD  Date Provider Notified 06/18/23  Time Provider Notified 1140  Method of Notification Page [facility] CHART)  Notification Reason Other (Comment) (pt is yellow MEWS because her temperature is 101.3)  Provider response See new orders (added Advil)  Date of Provider Response 06/18/23  Time of Provider Response 1205  Assess: SIRS CRITERIA  SIRS Temperature  1  SIRS Respirations  0  SIRS Pulse 1  SIRS WBC 0  SIRS Score Sum  2

## 2023-06-18 NOTE — Progress Notes (Signed)
 Lab stuck pt 4 times this am with no luck. Will send someone else

## 2023-06-18 NOTE — Assessment & Plan Note (Signed)
-   She is expected to get 6 sessions of chemotherapy for surgical intervention. - Her last chemotherapy session was 2 weeks ago. - She denied any current pain.

## 2023-06-18 NOTE — Assessment & Plan Note (Signed)
 Will continue statin therapy
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
## 2023-06-18 NOTE — Plan of Care (Addendum)
 Patient is alert and oriented X 4. She complained of loose stool more than 3 times, md made aware and get new order for sending stool sample. Labs are not able to get blood for blood culture even with multiple attempts.MD and charge Nurse made aware. Plan of care ongoing.    Problem: Fluid Volume: Goal: Hemodynamic stability will improve Outcome: Progressing   Problem: Clinical Measurements: Goal: Diagnostic test results will improve Outcome: Progressing Goal: Signs and symptoms of infection will decrease Outcome: Progressing   Problem: Respiratory: Goal: Ability to maintain adequate ventilation will improve Outcome: Progressing   Problem: Education: Goal: Ability to describe self-care measures that may prevent or decrease complications (Diabetes Survival Skills Education) will improve Outcome: Progressing Goal: Individualized Educational Video(s) Outcome: Progressing   Problem: Education: Goal: Ability to describe self-care measures that may prevent or decrease complications (Diabetes Survival Skills Education) will improve Outcome: Progressing Goal: Individualized Educational Video(s) Outcome: Progressing   Problem: Coping: Goal: Ability to adjust to condition or change in health will improve Outcome: Progressing   Problem: Fluid Volume: Goal: Ability to maintain a balanced intake and output will improve Outcome: Progressing   Problem: Nutritional: Goal: Maintenance of adequate nutrition will improve Outcome: Progressing Goal: Progress toward achieving an optimal weight will improve Outcome: Progressing   Problem: Health Behavior/Discharge Planning: Goal: Ability to manage health-related needs will improve Outcome: Progressing   Problem: Clinical Measurements: Goal: Ability to maintain clinical measurements within normal limits will improve Outcome: Progressing Goal: Will remain free from infection Outcome: Progressing Goal: Diagnostic test results will  improve Outcome: Progressing Goal: Respiratory complications will improve Outcome: Progressing Goal: Cardiovascular complication will be avoided Outcome: Progressing

## 2023-06-19 ENCOUNTER — Encounter: Payer: Self-pay | Admitting: Oncology

## 2023-06-19 ENCOUNTER — Inpatient Hospital Stay

## 2023-06-19 ENCOUNTER — Inpatient Hospital Stay: Admitting: Oncology

## 2023-06-19 ENCOUNTER — Other Ambulatory Visit: Payer: Self-pay | Admitting: Oncology

## 2023-06-19 ENCOUNTER — Inpatient Hospital Stay: Admitting: Radiology

## 2023-06-19 ENCOUNTER — Inpatient Hospital Stay (HOSPITAL_COMMUNITY)
Admit: 2023-06-19 | Discharge: 2023-06-19 | Disposition: A | Discharge status: Home / Self Care | Attending: Internal Medicine

## 2023-06-19 DIAGNOSIS — I38 Endocarditis, valve unspecified: Secondary | ICD-10-CM

## 2023-06-19 DIAGNOSIS — R6510 Systemic inflammatory response syndrome (SIRS) of non-infectious origin without acute organ dysfunction: Secondary | ICD-10-CM

## 2023-06-19 DIAGNOSIS — C259 Malignant neoplasm of pancreas, unspecified: Secondary | ICD-10-CM | POA: Diagnosis not present

## 2023-06-19 DIAGNOSIS — C252 Malignant neoplasm of tail of pancreas: Secondary | ICD-10-CM

## 2023-06-19 DIAGNOSIS — R7881 Bacteremia: Secondary | ICD-10-CM | POA: Diagnosis not present

## 2023-06-19 DIAGNOSIS — B9561 Methicillin susceptible Staphylococcus aureus infection as the cause of diseases classified elsewhere: Secondary | ICD-10-CM

## 2023-06-19 HISTORY — PX: IR REMOVAL TUN ACCESS W/ PORT W/O FL MOD SED: IMG2290

## 2023-06-19 LAB — GASTROINTESTINAL PANEL BY PCR, STOOL (REPLACES STOOL CULTURE)

## 2023-06-19 LAB — ECHOCARDIOGRAM COMPLETE BUBBLE STUDY
AR max vel: 2.87 cm2
AV Area VTI: 4.14 cm2
AV Area mean vel: 3.05 cm2
AV Mean grad: 2 mmHg
AV Peak grad: 2.9 mmHg
Ao pk vel: 0.86 m/s
Area-P 1/2: 6.77 cm2
S' Lateral: 2.3 cm

## 2023-06-19 LAB — MAGNESIUM
Magnesium: 1.8 mg/dL (ref 1.7–2.4)
Magnesium: 1.9 mg/dL (ref 1.7–2.4)

## 2023-06-19 LAB — GLUCOSE, CAPILLARY
Glucose-Capillary: 119 mg/dL — ABNORMAL HIGH (ref 70–99)
Glucose-Capillary: 125 mg/dL — ABNORMAL HIGH (ref 70–99)
Glucose-Capillary: 136 mg/dL — ABNORMAL HIGH (ref 70–99)
Glucose-Capillary: 186 mg/dL — ABNORMAL HIGH (ref 70–99)

## 2023-06-19 LAB — PHOSPHORUS
Phosphorus: 2.8 mg/dL (ref 2.5–4.6)
Phosphorus: 3 mg/dL (ref 2.5–4.6)

## 2023-06-19 LAB — CBC
HCT: 29.7 % — ABNORMAL LOW (ref 36.0–46.0)
Hemoglobin: 10.2 g/dL — ABNORMAL LOW (ref 12.0–15.0)
MCH: 29.3 pg (ref 26.0–34.0)
MCHC: 34.3 g/dL (ref 30.0–36.0)
MCV: 85.3 fL (ref 80.0–100.0)
Platelets: 111 10*3/uL — ABNORMAL LOW (ref 150–400)
RBC: 3.48 MIL/uL — ABNORMAL LOW (ref 3.87–5.11)
RDW: 14.5 % (ref 11.5–15.5)
WBC: 1.5 10*3/uL — ABNORMAL LOW (ref 4.0–10.5)
nRBC: 0 % (ref 0.0–0.2)

## 2023-06-19 LAB — URINE CULTURE: Culture: NO GROWTH

## 2023-06-19 LAB — BASIC METABOLIC PANEL WITH GFR
Anion gap: 8 (ref 5–15)
Anion gap: 9 (ref 5–15)
BUN: 6 mg/dL — ABNORMAL LOW (ref 8–23)
BUN: 7 mg/dL — ABNORMAL LOW (ref 8–23)
CO2: 22 mmol/L (ref 22–32)
CO2: 23 mmol/L (ref 22–32)
Calcium: 7.6 mg/dL — ABNORMAL LOW (ref 8.9–10.3)
Calcium: 7.6 mg/dL — ABNORMAL LOW (ref 8.9–10.3)
Chloride: 101 mmol/L (ref 98–111)
Chloride: 102 mmol/L (ref 98–111)
Creatinine, Ser: 0.48 mg/dL (ref 0.44–1.00)
Creatinine, Ser: 0.67 mg/dL (ref 0.44–1.00)
GFR, Estimated: >60 mL/min (ref >60)
GFR, Estimated: >60 mL/min (ref >60)
Glucose, Bld: 122 mg/dL — ABNORMAL HIGH (ref 70–99)
Glucose, Bld: 131 mg/dL — ABNORMAL HIGH (ref 70–99)
Potassium: 3.3 mmol/L — ABNORMAL LOW (ref 3.5–5.1)
Potassium: 3.5 mmol/L (ref 3.5–5.1)
Sodium: 132 mmol/L — ABNORMAL LOW (ref 135–145)
Sodium: 133 mmol/L — ABNORMAL LOW (ref 135–145)

## 2023-06-19 LAB — C DIFFICILE QUICK SCREEN W PCR REFLEX
C Diff antigen: NEGATIVE
C Diff interpretation: NOT DETECTED
C Diff toxin: NEGATIVE

## 2023-06-19 MED ORDER — ENOXAPARIN SODIUM 40 MG/0.4ML IJ SOSY
40.0000 mg | PREFILLED_SYRINGE | INTRAMUSCULAR | Status: DC
  Administered 2023-06-20 – 2023-06-23 (×4): 40 mg via SUBCUTANEOUS
  Filled 2023-06-19 (×4): qty 0.4

## 2023-06-19 MED ORDER — LIDOCAINE HCL 1 % IJ SOLN
INTRAMUSCULAR | Status: AC
Start: 2023-06-19 — End: 2023-06-19
  Filled 2023-06-19: qty 20

## 2023-06-19 MED ORDER — LIDOCAINE HCL 1 % IJ SOLN
10.0000 mL | Freq: Once | INTRAMUSCULAR | Status: AC
  Administered 2023-06-19: 10 mL via INTRADERMAL

## 2023-06-19 NOTE — Procedures (Signed)
 Interventional Radiology Procedure Note  Procedure: RIJ port removal  Complications: None  Estimated Blood Loss: < 10 mL  Findings: RIJ SL chest port removed.  Tip cut and sent for culturing.  Pocket closed.  Rosetta Cons, MD

## 2023-06-19 NOTE — Progress Notes (Signed)
 PROGRESS NOTE    Kathleen Russell  ZOX:[id number] DOB: [birth year] DOA: 06/17/2023 PCP: Thersia Flax, MD    Assessment & Plan:   Principal Problem:   SIRS (systemic inflammatory response syndrome) (HCC) Active Problems:   Dyslipidemia   Type 2 diabetes mellitus without complications (HCC)   Pancreatic cancer (HCC)  Assessment and Plan:  MSSA bacteremia: etiology unclear, possibly secondary to chemo port. S/p chemo port removed 06/19/23. Continue on IV cefazolin  as per ID. Repeat blood cxs NGTD. Echo ordered.    Sinus tachycardia: s/p cardizem  IV x 1. Continue on tele    Diarrhea: c. diff, GI PCR panel ordered.    Hypokalemia: WNL today   Hypophosphatemia: WNL today   Hypomagnesemia: WNL today   Pancreatic adenocarcinoma: stage II. On chemotherapy. Will f/u outpatient w/ onco, Dr. Randy Buttery   Pancytopenia: likely secondary to recent chemo. No need for a transfusion currently.    DM2: fair control, HbA1c 7.4. Continue on SSI w/ accuchecks. Holding home tradjenta     HLD: continue statin    DVT prophylaxis: lovenox   Code Status: full  Family Communication: called pt's husband, [name], no answer so I left a voicemail  Disposition Plan: likely d/c back home   Level of care: Telemetry Medical  Status is: Inpatient Remains inpatient appropriate because: severity of illness    Consultants:  ID  Procedures:   Antimicrobials: cefazolin     Subjective: Pt c/o fatigue and being hungry   Objective: Vitals:   06/18/23 2038 06/19/23 0047 06/19/23 0448 06/19/23 0804  BP: 114/71 125/69 107/65 110/65  Pulse: (!) 105 (!) 110 (!) 109 (!) 108  Resp: 20 18 18  (!) 24  Temp: 99.7 F (37.6 C) 97.9 F (36.6 C) 99.1 F (37.3 C) 99.8 F (37.7 C)  TempSrc:    Oral  SpO2: 99% 96% 94% 100%  Weight:      Height:        Intake/Output Summary (Last 24 hours) at 06/19/2023 0814 Last data filed at 06/19/2023 0441 Gross per 24 hour  Intake 1652.51 ml  Output --  Net 1652.51 ml    Filed Weights   06/17/23 2108  Weight: 74.8 kg    Examination:  General exam: Appears calm and comfortable  Respiratory system: Clear to auscultation. Respiratory effort normal. Cardiovascular system: S1 & S2+. No rubs, gallops or clicks.  Gastrointestinal system: Abdomen is nondistended, soft and nontender. Normal bowel sounds heard. Central nervous system: Alert and oriented. Moves all extremities  Psychiatry: Judgement and insight appear normal. Mood & affect appropriate.     Data Reviewed: I have personally reviewed following labs and imaging studies  CBC: Recent Labs  Lab 06/17/23 2227 06/18/23 0259 06/19/23 0602  WBC 3.1* 2.9* 1.5*  NEUTROABS 2.0  --   --   HGB 12.6 11.7* 10.2*  HCT 39.1 36.2 29.7*  MCV 87.3 89.6 85.3  PLT 151 114* 111*   Basic Metabolic Panel: Recent Labs  Lab 06/17/23 2227 06/18/23 0935 06/18/23 2357 06/19/23 0602  NA 131* 133* 132* 133*  K 3.5 3.0* 3.3* 3.5  CL 99 103 102 101  CO2 23 22 22 23   GLUCOSE 200* 212* 122* 131*  BUN 11 8 7* 6*  CREATININE 0.70 0.53 0.48 0.67  CALCIUM  8.3* 7.6* 7.6* 7.6*  MG  --  1.6* 1.9 1.8  PHOS  --  2.2* 2.8 3.0   GFR: Estimated Creatinine Clearance: 66.6 mL/min (by C-G formula based on SCr of 0.67 mg/dL). Liver Function Tests: Recent Labs  Lab 06/17/23 2227  AST 14*  ALT 12  ALKPHOS 56  BILITOT 0.5  PROT 5.9*  ALBUMIN 3.0*   No results for input(s): LIPASE, AMYLASE in the last 168 hours. No results for input(s): AMMONIA in the last 168 hours. Coagulation Profile: Recent Labs  Lab 06/17/23 2212 06/18/23 0935  INR 1.0 1.2   Cardiac Enzymes: No results for input(s): CKTOTAL, CKMB, CKMBINDEX, TROPONINI in the last 168 hours. BNP (last 3 results) No results for input(s): PROBNP in the last 8760 hours. HbA1C: No results for input(s): HGBA1C in the last 72 hours. CBG: Recent Labs  Lab 06/18/23 1010 06/18/23 1137 06/18/23 1747 06/18/23 2036 06/19/23 0805  GLUCAP  199* 155* 101* 149* 119*   Lipid Profile: No results for input(s): CHOL, HDL, LDLCALC, TRIG, CHOLHDL, LDLDIRECT in the last 72 hours. Thyroid  Function Tests: Recent Labs    06/18/23 0935  TSH 2.506  FREET4 0.85   Anemia Panel: No results for input(s): VITAMINB12, FOLATE, FERRITIN, TIBC, IRON, RETICCTPCT in the last 72 hours. Sepsis Labs: Recent Labs  Lab 06/17/23 2212 06/17/23 2346  LATICACIDVEN 1.5 1.0    Recent Results (from the past 240 hours)  Culture, blood (Routine x 2)     Status: Abnormal (Preliminary result)   Collection Time: 06/17/23 10:12 PM   Specimen: Right Antecubital; Blood  Result Value Ref Range Status   Specimen Description   Final    RIGHT ANTECUBITAL Performed at [facility], [address] [place], [place] [address]    Special Requests   Final    BOTTLES DRAWN AEROBIC AND ANAEROBIC Blood Culture adequate volume Performed at [facility], [address] [place], [place] [address]    Culture  Setup Time   Final    GRAM POSITIVE COCCI IN BOTH AEROBIC AND ANAEROBIC BOTTLES CRITICAL VALUE NOTED.  VALUE IS CONSISTENT WITH PREVIOUSLY REPORTED AND CALLED VALUE. Performed at [facility], [address] [place], [place] [address]    Culture STAPHYLOCOCCUS AUREUS (A)  Final   Report Status PENDING  Incomplete  Resp panel by RT-PCR (RSV, Flu A&B, Covid) Anterior Nasal Swab     Status: None   Collection Time: 06/17/23 10:12 PM   Specimen: Anterior Nasal Swab  Result Value Ref Range Status   SARS Coronavirus 2 by RT PCR NEGATIVE NEGATIVE Final    Comment: (NOTE) SARS-CoV-2 target nucleic acids are NOT DETECTED.  The SARS-CoV-2 RNA is generally detectable in upper respiratory specimens during the acute phase of infection. The lowest concentration of SARS-CoV-2 viral copies this assay can detect is 138 copies/mL. A negative result does not preclude SARS-Cov-2 infection and should not be used as the  sole basis for treatment or other patient management decisions. A negative result may occur with  improper specimen collection/handling, submission of specimen other than nasopharyngeal swab, presence of viral mutation(s) within the areas targeted by this assay, and inadequate number of viral copies(<138 copies/mL). A negative result must be combined with clinical observations, patient history, and epidemiological information. The expected result is Negative.  Fact Sheet for Patients:  BloggerCourse.com  Fact Sheet for Healthcare Providers:  SeriousBroker.it  This test is no t yet approved or cleared by the [place]  FDA and  has been authorized for detection and/or diagnosis of SARS-CoV-2 by FDA under an Emergency Use Authorization (EUA). This EUA will remain  in effect (meaning this test can be used) for the duration of the COVID-19 declaration under Section 564(b)(1) of the Act, 21 U.S.C.section  360bbb-3(b)(1), unless the authorization is terminated  or revoked sooner.       Influenza A by PCR NEGATIVE NEGATIVE Final   Influenza B by PCR NEGATIVE NEGATIVE Final    Comment: (NOTE) The Xpert Xpress SARS-CoV-2/FLU/RSV plus assay is intended as an aid in the diagnosis of influenza from Nasopharyngeal swab specimens and should not be used as a sole basis for treatment. Nasal washings and aspirates are unacceptable for Xpert Xpress SARS-CoV-2/FLU/RSV testing.  Fact Sheet for Patients: BloggerCourse.com  Fact Sheet for Healthcare Providers: SeriousBroker.it  This test is not yet approved or cleared by the [place]  FDA and has been authorized for detection and/or diagnosis of SARS-CoV-2 by FDA under an Emergency Use Authorization (EUA). This EUA will remain in effect (meaning this test can be used) for the duration of the COVID-19 declaration under Section 564(b)(1) of the  Act, 21 U.S.C. section 360bbb-3(b)(1), unless the authorization is terminated or revoked.     Resp Syncytial Virus by PCR NEGATIVE NEGATIVE Final    Comment: (NOTE) Fact Sheet for Patients: BloggerCourse.com  Fact Sheet for Healthcare Providers: SeriousBroker.it  This test is not yet approved or cleared by the [place]  FDA and has been authorized for detection and/or diagnosis of SARS-CoV-2 by FDA under an Emergency Use Authorization (EUA). This EUA will remain in effect (meaning this test can be used) for the duration of the COVID-19 declaration under Section 564(b)(1) of the Act, 21 U.S.C. section 360bbb-3(b)(1), unless the authorization is terminated or revoked.  Performed at [facility], [address] [place], [place] [address]   Culture, blood (Routine x 2)     Status: Abnormal (Preliminary result)   Collection Time: 06/17/23 10:17 PM   Specimen: BLOOD RIGHT HAND  Result Value Ref Range Status   Specimen Description   Final    BLOOD RIGHT HAND Performed at [facility], [address], [place], [place] [address]    Special Requests   Final    BOTTLES DRAWN AEROBIC AND ANAEROBIC Blood Culture results may not be optimal due to an inadequate volume of blood received in culture bottles Performed at [facility], [address], [place], [place] [address]    Culture  Setup Time   Final    GRAM POSITIVE COCCI IN BOTH AEROBIC AND ANAEROBIC BOTTLES CRITICAL RESULT CALLED TO, READ BACK BY AND VERIFIED WITH: EMILY STEINBOCK 06/18/23 1414 MW    Culture (A)  Final    STAPHYLOCOCCUS AUREUS SUSCEPTIBILITIES TO FOLLOW Performed at [facility], [address], [place], [place] [address]    Report Status PENDING  Incomplete  Blood Culture ID Panel (Reflexed)     Status: Abnormal   Collection Time: 06/17/23 10:17 PM  Result Value Ref Range Status   Enterococcus faecalis NOT DETECTED NOT  DETECTED Final   Enterococcus Faecium NOT DETECTED NOT DETECTED Final   Listeria monocytogenes NOT DETECTED NOT DETECTED Final   Staphylococcus species DETECTED (A) NOT DETECTED Final    Comment: CRITICAL RESULT CALLED TO, READ BACK BY AND VERIFIED WITH: EMILY STEINBOCK 06/18/23 1414 MW    Staphylococcus aureus (BCID) DETECTED (A) NOT DETECTED Final    Comment: CRITICAL RESULT CALLED TO, READ BACK BY AND VERIFIED WITH: EMILY STEINBOCK 06/18/23 1414 MW    Staphylococcus epidermidis NOT DETECTED NOT DETECTED Final   Staphylococcus lugdunensis NOT DETECTED NOT DETECTED Final   Streptococcus species NOT DETECTED NOT DETECTED Final   Streptococcus agalactiae NOT DETECTED NOT DETECTED Final   Streptococcus  pneumoniae NOT DETECTED NOT DETECTED Final   Streptococcus pyogenes NOT DETECTED NOT DETECTED Final   A.calcoaceticus-baumannii NOT DETECTED NOT DETECTED Final   Bacteroides fragilis NOT DETECTED NOT DETECTED Final   Enterobacterales NOT DETECTED NOT DETECTED Final   Enterobacter cloacae complex NOT DETECTED NOT DETECTED Final   Escherichia coli NOT DETECTED NOT DETECTED Final   Klebsiella aerogenes NOT DETECTED NOT DETECTED Final   Klebsiella oxytoca NOT DETECTED NOT DETECTED Final   Klebsiella pneumoniae NOT DETECTED NOT DETECTED Final   Proteus species NOT DETECTED NOT DETECTED Final   Salmonella species NOT DETECTED NOT DETECTED Final   Serratia marcescens NOT DETECTED NOT DETECTED Final   Haemophilus influenzae NOT DETECTED NOT DETECTED Final   Neisseria meningitidis NOT DETECTED NOT DETECTED Final   Pseudomonas aeruginosa NOT DETECTED NOT DETECTED Final   Stenotrophomonas maltophilia NOT DETECTED NOT DETECTED Final   Candida albicans NOT DETECTED NOT DETECTED Final   Candida auris NOT DETECTED NOT DETECTED Final   Candida glabrata NOT DETECTED NOT DETECTED Final   Candida krusei NOT DETECTED NOT DETECTED Final   Candida parapsilosis NOT DETECTED NOT DETECTED Final   Candida  tropicalis NOT DETECTED NOT DETECTED Final   Cryptococcus neoformans/gattii NOT DETECTED NOT DETECTED Final   Meth resistant mecA/C and MREJ NOT DETECTED NOT DETECTED Final    Comment: Performed at [facility], [address] [place], [place] [address]  Culture, blood (Routine X 2) w Reflex to ID Panel     Status: None (Preliminary result)   Collection Time: 06/18/23 11:57 PM   Specimen: BLOOD  Result Value Ref Range Status   Specimen Description BLOOD BLOOD LEFT ARM  Final   Special Requests   Final    BOTTLES DRAWN AEROBIC AND ANAEROBIC Blood Culture adequate volume   Culture   Final    NO GROWTH < 12 HOURS Performed at [facility], [address] [place], [place] [address]    Report Status PENDING  Incomplete  Culture, blood (Routine X 2) w Reflex to ID Panel     Status: None (Preliminary result)   Collection Time: 06/19/23 12:18 AM   Specimen: BLOOD  Result Value Ref Range Status   Specimen Description BLOOD BLOOD LEFT ARM  Final   Special Requests   Final    BOTTLES DRAWN AEROBIC AND ANAEROBIC Blood Culture results may not be optimal due to an inadequate volume of blood received in culture bottles   Culture   Final    NO GROWTH < 12 HOURS Performed at [facility], [address] [place], [place] [address]    Report Status PENDING  Incomplete         Radiology Studies: DG Chest 2 View Result Date: 06/17/2023 CLINICAL DATA:  Fever EXAM: CHEST - 2 VIEW COMPARISON:  02/17/2020 FINDINGS: Right-sided central venous catheter tip at the cavoatrial junction. No acute airspace disease, pleural effusion, or pneumothorax. Stable cardiomediastinal silhouette IMPRESSION: No active cardiopulmonary disease. Electronically Signed   By: Esmeralda Hedge M.D.   On: 06/17/2023 21:40        Scheduled Meds:  cetirizine   10 mg Oral QPM   Chlorhexidine  Gluconate Cloth  6 each Topical Daily   enoxaparin  (LOVENOX ) injection  40 mg Subcutaneous Q24H   feeding  supplement  237 mL Oral BID BM   insulin  aspart  0-15 Units Subcutaneous TID WC   lidocaine -EPINEPHrine -tetracaine   3 mL Topical Once   linagliptin   5 mg Oral Daily   pantoprazole   40  mg Oral Daily   rosuvastatin   20 mg Oral Daily   saccharomyces boulardii  250 mg Oral BID   sodium chloride  flush  10-40 mL Intracatheter Q12H   Continuous Infusions:   ceFAZolin  (ANCEF ) IV Stopped (06/19/23 0441)     LOS: 1 day     Alphonsus Jeans, MD [facility] Pager [phone]  If 7PM-7AM, please contact night-coverage [email or web address] 06/19/2023, 8:14 AM

## 2023-06-19 NOTE — Progress Notes (Signed)
 Initial Nutrition Assessment  DOCUMENTATION CODES:   Not applicable  INTERVENTION:   -Continue regular diet -MVI with minerals daily -Continue Ensure Plus High Protein po BID, each supplement provides 350 kcal and 20 grams of protein   NUTRITION DIAGNOSIS:   Increased nutrient needs related to cancer and cancer related treatments as evidenced by estimated needs.  GOAL:   Patient will meet greater than or equal to 90% of their needs  MONITOR:   PO intake, Supplement acceptance  REASON FOR ASSESSMENT:   Malnutrition Screening Tool    ASSESSMENT:   Pt with medical history significant for pancreatic cancer on chemotherapy proceeding with surgical intervention, GERD, allergic rhinitis, dyslipidemia and type 2 diabetes mellitus as well as Raynaud's phenomenon who presented with acute onset of fever that was up to 102.7 with associated chills. The patient admitted with dysuria without urinary frequency or urgency or hematuria or flank pain.  Pt admitted with MSSA bacteremia.   6/13- s/p Procedure: RIJ port removal   Reviewed I/O's: +1.7 L x 24 hours and +3.3 L since admission  Plan for ID consult today. Pt will require TEE and repeat blood cultures.   Pt is followed by RD at cancer center for pancreatic cancer on chemo.   Spoke with pt and husband at bedside. Pt reports feeling better today and just returned from port removal. Intake has improved greatly since admission. She consumed 100% of her breakfast, which is abnormal for her. Per pt, she has had a decreased appetite over the past 3 months. She initially was losing weight to help control DM, however, also received pancreatic cancer diagnosis about the same time. Over the past few weeks, pt has been struggling with thrush, which resulted in painful swallowing and taste changes. Pt states that this has been relieved with Magic Mouthwash (it really is magic).   Pt shares that she has lost about 10# over the past 3 months.  Pt has experienced a 9.2% wt loss over the past 3 months, which is significant for time frame.   Discussed importance of good meal and supplement intake to promote healing. Pt reports that she has been supplementing with electrolyte drinks and yogurt smoothies. Pt is willing to try Ensure. Reviewed menu to help pt identify items she may like.   Medications reviewed and include lovenox  and florastor.    Lab Results  Component Value Date   HGBA1C 7.4 (H) 02/09/2023   PTA DM medications are 25 mg jaunuvia daily.   Labs reviewed: Na: 133, CBGS: 119-125 (inpatient orders for glycemic control are 0-15 units insulin  aspart TID with meals).    NUTRITION - FOCUSED PHYSICAL EXAM:  Flowsheet Row Most Recent Value  Orbital Region No depletion  Upper Arm Region Mild depletion  Thoracic and Lumbar Region No depletion  Buccal Region No depletion  Temple Region No depletion  Clavicle Bone Region No depletion  Clavicle and Acromion Bone Region No depletion  Scapular Bone Region No depletion  Dorsal Hand No depletion  Patellar Region Mild depletion  Anterior Thigh Region Mild depletion  Posterior Calf Region Mild depletion  Edema (RD Assessment) None  Hair Reviewed  Eyes Reviewed  Mouth Reviewed  Skin Reviewed  Nails Reviewed    Diet Order:   Diet Order             Diet regular Fluid consistency: Thin  Diet effective now                   EDUCATION NEEDS:  Education needs have been addressed  Skin:  Skin Assessment: Reviewed RN Assessment  Last BM:  06/18/23  Height:   Ht Readings from Last 1 Encounters:  06/17/23 5' 4 (1.626 m)    Weight:   Wt Readings from Last 1 Encounters:  06/17/23 74.8 kg    Ideal Body Weight:  54.5 kg  BMI:  Body mass index is 28.32 kg/m.  Estimated Nutritional Needs:   Kcal:  1900-2100  Protein:  105-120 grams  Fluid:  1.9-2.1 L    Herschel Lords, RD, LDN, CDCES Registered Dietitian III Certified Diabetes Care and Education  Specialist If unable to reach this RD, please use RD Inpatient group chat on secure chat between hours of 8am-4 pm daily

## 2023-06-19 NOTE — Consult Note (Signed)
 MEDICATION-RELATED CONSULT NOTE   IR Procedure Consult - Anticoagulant/Antiplatelet PTA/Inpatient Med List Review by Pharmacist    Procedure: RIJ port removal     Completed: 6/13 @ 10:09  Post-Procedural bleeding risk per IR MD assessment:    Antithrombotic medications on inpatient or PTA profile prior to procedure:   enoxaparin     Recommended restart time per IR Post-Procedure Guidelines:  LOW.  at least 4 hours or at next standard dose interval      Other considerations:      Plan:     Will restart enoxaparin  40 mg daily @2200 .

## 2023-06-19 NOTE — Progress Notes (Signed)
 Patient transported to Interventional Radiology in bed at 0900 in stable condition.  Patient returned from Interventional Radiology at 1007 in bed in stable condition.

## 2023-06-19 NOTE — Consult Note (Signed)
 [facility]    Date of Admission:  06/17/2023     Reason for Consult: mssa bacteremia    Referring Provider: Deena Farrier     Lines:  Chemo port removed 6/13  Abx: 6/12-c cefazolin   6/11-12 vanc/cefepime /flagyl         Assessment: 68 yo female raynayd, dm2, gerd, recently dx'ed with pancreatic tail cancer, on neoadjuvant chemotherapy (cycle 4 of 6 last given about 2 weeks prior to admission), admitted 6/11 for several days malaise found to have sepsis and mssa bacteremia  Source port vs gut tranlocation   6/11 bcx positive 6/11 ucx negative 6/12 bcx negative 6/13 bcx (prior to removal of port) ngtd 6/13 catheter tip in progress  No other focal pain or ortho/cardiac device. She has left thumb carpal-metacarpal tendon repair in the past and no sx at this time   Plan: Repeat bcx tomorrow placed Tte; she'll need tee as well and come next week can arrange Continue cefazolin  Duration at least 4 weeks given immunosuppressant/community onset mssa bsi; echo will determine final duration No other metastatic foci per history/exam at this time From id standpoint, would like to see clearance of bacteremia and if echo finds endocarditis would like to have 1-2 weeks clearance of bacteremia/clinical improvement prior to starting chemo again Maintain standard isolation precaution for mssa bsi Dr snider phone coverage this weekend if needed; dr Harwood Lingo covering next week Discussed with primary team      ------------------------------------------------ Principal Problem:   SIRS (systemic inflammatory response syndrome) (HCC) Active Problems:   Dyslipidemia   Type 2 diabetes mellitus without complications (HCC)   Pancreatic cancer (HCC)    HPI: Kathleen Russell is a 67 y.o. female dm2, raynaud, gerd, recently dx'ed with pancreatic tail cancer, on neoadjuvant chemotherapy (cycle 4 of 6 last given about 2 weeks prior to admission),  admitted 6/11 for several days malaise found to have sepsis and mssa bacteremia   Her last chemo was 2 weeks prior (4th round). She usually have some loose stool the first week and malaise. This time she felt malaise into the second week associated with fever  She didn't have stool for 2 days but this admission after abx started had loose stool/not frank diarrhea within 24 hours admission  On presentation Febrile Wbc 2.9 Bcx mssa Cxr no active disease   Port removed 6/13  Abx cefazolin   Ups and down in terms of how she feels the last 2 days here but today thinks it's better  No focal pain (joint/back). Prior left thumb surgery but no ortho device or cardiac device  Questions if she can have chemo soon   Family History  Problem Relation Age of Onset   Arthritis Mother    Diabetes Mother    Hyperlipidemia Father    Diabetes Father    Hypertension Father    Heart disease Father        silent heart attack   Diabetes Sister    Alcohol abuse Maternal Uncle    Diabetes Paternal Aunt    Cancer Paternal Uncle        lung   Heart disease Paternal Uncle    Hypertension Paternal Uncle    Diabetes Paternal Uncle    Breast cancer Maternal Aunt     Social History   Tobacco Use   Smoking status: Never   Smokeless tobacco: Never  Vaping Use   Vaping status: Never Used  Substance Use Topics  Alcohol use: Not Currently    Comment: few glasses wine per year   Drug use: No    Allergies  Allergen Reactions   Latex Itching and Rash    Review of Systems: ROS All Other ROS was negative, except mentioned above   Past Medical History:  Diagnosis Date   allergic rhinitis    Broken ankle    Chicken pox    COVID-19 01/14/2020   GERD (gastroesophageal reflux disease)    History of broken nose    Hx: UTI (urinary tract infection)    Kidney infection    Left breast mass 02/24/2019   Menopause, premature    Pancreatic cancer (HCC)    Raynaud's phenomenon     Rhinitis, nonallergic    Skin cancer    Type 2 diabetes mellitus with obesity (HCC) 06/10/2021       Scheduled Meds:  cetirizine   10 mg Oral QPM   Chlorhexidine  Gluconate Cloth  6 each Topical Daily   [START ON 06/20/2023] enoxaparin  (LOVENOX ) injection  40 mg Subcutaneous Q24H   feeding supplement  237 mL Oral BID BM   insulin  aspart  0-15 Units Subcutaneous TID WC   lidocaine -EPINEPHrine -tetracaine   3 mL Topical Once   pantoprazole   40 mg Oral Daily   rosuvastatin   20 mg Oral Daily   saccharomyces boulardii  250 mg Oral BID   sodium chloride  flush  10-40 mL Intracatheter Q12H   Continuous Infusions:   ceFAZolin  (ANCEF ) IV 2 g (06/19/23 1242)   PRN Meds:.acetaminophen  **OR** acetaminophen , albuterol , fluticasone , loperamide , magnesium  hydroxide, ondansetron  **OR** ondansetron  (ZOFRAN ) IV, sodium chloride  flush, traZODone    OBJECTIVE: Blood pressure 104/68, pulse (!) 112, temperature (!) 100.6 F (38.1 C), resp. rate 14, height 5' 4 (1.626 m), weight 74.8 kg, SpO2 95%.  Physical Exam General/constitutional: no distress, pleasant HEENT: Normocephalic, PER, Conj Clear, EOMI, Oropharynx clear Neck supple CV: rrr no mrg Lungs: clear to auscultation, normal respiratory effort Abd: Soft, Nontender Ext: no edema Skin: right chest port site no swelling/redness/tenderness; port removed Neuro: nonfocal MSK: no peripheral joint swelling/tenderness/warmth; back spines nontender     Lab Results Lab Results  Component Value Date   WBC 1.5 (L) 06/19/2023   HGB 10.2 (L) 06/19/2023   HCT 29.7 (L) 06/19/2023   MCV 85.3 06/19/2023   PLT 111 (L) 06/19/2023    Lab Results  Component Value Date   CREATININE 0.67 06/19/2023   BUN 6 (L) 06/19/2023   NA 133 (L) 06/19/2023   K 3.5 06/19/2023   CL 101 06/19/2023   CO2 23 06/19/2023    Lab Results  Component Value Date   ALT 12 06/17/2023   AST 14 (L) 06/17/2023   ALKPHOS 56 06/17/2023   BILITOT 0.5 06/17/2023       Microbiology: Recent Results (from the past 240 hours)  Culture, blood (Routine x 2)     Status: Abnormal (Preliminary result)   Collection Time: 06/17/23 10:12 PM   Specimen: Right Antecubital; Blood  Result Value Ref Range Status   Specimen Description   Final    RIGHT ANTECUBITAL Performed at [facility], [address] [place], [place] [address]    Special Requests   Final    BOTTLES DRAWN AEROBIC AND ANAEROBIC Blood Culture adequate volume Performed at [facility], [address] [place], [place] [address]    Culture  Setup Time   Final    GRAM POSITIVE COCCI IN BOTH AEROBIC AND ANAEROBIC BOTTLES CRITICAL VALUE NOTED.  VALUE  IS CONSISTENT WITH PREVIOUSLY REPORTED AND CALLED VALUE. Performed at [facility], [address], [place], [place] [address]    Culture STAPHYLOCOCCUS AUREUS (A)  Final   Report Status PENDING  Incomplete  Resp panel by RT-PCR (RSV, Flu A&B, Covid) Anterior Nasal Swab     Status: None   Collection Time: 06/17/23 10:12 PM   Specimen: Anterior Nasal Swab  Result Value Ref Range Status   SARS Coronavirus 2 by RT PCR NEGATIVE NEGATIVE Final    Comment: (NOTE) SARS-CoV-2 target nucleic acids are NOT DETECTED.  The SARS-CoV-2 RNA is generally detectable in upper respiratory specimens during the acute phase of infection. The lowest concentration of SARS-CoV-2 viral copies this assay can detect is 138 copies/mL. A negative result does not preclude SARS-Cov-2 infection and should not be used as the sole basis for treatment or other patient management decisions. A negative result may occur with  improper specimen collection/handling, submission of specimen other than nasopharyngeal swab, presence of viral mutation(s) within the areas targeted by this assay, and inadequate number of viral copies(<138 copies/mL). A negative result must be combined with clinical observations, patient history, and  epidemiological information. The expected result is Negative.  Fact Sheet for Patients:  BloggerCourse.com  Fact Sheet for Healthcare Providers:  SeriousBroker.it  This test is no t yet approved or cleared by the [place]  FDA and  has been authorized for detection and/or diagnosis of SARS-CoV-2 by FDA under an Emergency Use Authorization (EUA). This EUA will remain  in effect (meaning this test can be used) for the duration of the COVID-19 declaration under Section 564(b)(1) of the Act, 21 U.S.C.section 360bbb-3(b)(1), unless the authorization is terminated  or revoked sooner.       Influenza A by PCR NEGATIVE NEGATIVE Final   Influenza B by PCR NEGATIVE NEGATIVE Final    Comment: (NOTE) The Xpert Xpress SARS-CoV-2/FLU/RSV plus assay is intended as an aid in the diagnosis of influenza from Nasopharyngeal swab specimens and should not be used as a sole basis for treatment. Nasal washings and aspirates are unacceptable for Xpert Xpress SARS-CoV-2/FLU/RSV testing.  Fact Sheet for Patients: BloggerCourse.com  Fact Sheet for Healthcare Providers: SeriousBroker.it  This test is not yet approved or cleared by the [place]  FDA and has been authorized for detection and/or diagnosis of SARS-CoV-2 by FDA under an Emergency Use Authorization (EUA). This EUA will remain in effect (meaning this test can be used) for the duration of the COVID-19 declaration under Section 564(b)(1) of the Act, 21 U.S.C. section 360bbb-3(b)(1), unless the authorization is terminated or revoked.     Resp Syncytial Virus by PCR NEGATIVE NEGATIVE Final    Comment: (NOTE) Fact Sheet for Patients: BloggerCourse.com  Fact Sheet for Healthcare Providers: SeriousBroker.it  This test is not yet approved or cleared by the [place]  FDA and has been  authorized for detection and/or diagnosis of SARS-CoV-2 by FDA under an Emergency Use Authorization (EUA). This EUA will remain in effect (meaning this test can be used) for the duration of the COVID-19 declaration under Section 564(b)(1) of the Act, 21 U.S.C. section 360bbb-3(b)(1), unless the authorization is terminated or revoked.  Performed at [facility], [address] [place], [place] [address]   Culture, blood (Routine x 2)     Status: Abnormal (Preliminary result)   Collection Time: 06/17/23 10:17 PM   Specimen: BLOOD RIGHT HAND  Result Value Ref Range Status   Specimen Description   Final    BLOOD RIGHT  HAND Performed at [facility], [address] [place], [place] [address]    Special Requests   Final    BOTTLES DRAWN AEROBIC AND ANAEROBIC Blood Culture results may not be optimal due to an inadequate volume of blood received in culture bottles Performed at [facility], [address], [place], [place] [address]    Culture  Setup Time   Final    GRAM POSITIVE COCCI IN BOTH AEROBIC AND ANAEROBIC BOTTLES CRITICAL RESULT CALLED TO, READ BACK BY AND VERIFIED WITH: EMILY STEINBOCK 06/18/23 1414 MW    Culture (A)  Final    STAPHYLOCOCCUS AUREUS SUSCEPTIBILITIES TO FOLLOW Performed at [facility], [address] [place], [place] [address]    Report Status PENDING  Incomplete  Blood Culture ID Panel (Reflexed)     Status: Abnormal   Collection Time: 06/17/23 10:17 PM  Result Value Ref Range Status   Enterococcus faecalis NOT DETECTED NOT DETECTED Final   Enterococcus Faecium NOT DETECTED NOT DETECTED Final   Listeria monocytogenes NOT DETECTED NOT DETECTED Final   Staphylococcus species DETECTED (A) NOT DETECTED Final    Comment: CRITICAL RESULT CALLED TO, READ BACK BY AND VERIFIED WITH: EMILY STEINBOCK 06/18/23 1414 MW    Staphylococcus aureus (BCID) DETECTED (A) NOT DETECTED Final    Comment: CRITICAL RESULT CALLED TO, READ BACK  BY AND VERIFIED WITH: EMILY STEINBOCK 06/18/23 1414 MW    Staphylococcus epidermidis NOT DETECTED NOT DETECTED Final   Staphylococcus lugdunensis NOT DETECTED NOT DETECTED Final   Streptococcus species NOT DETECTED NOT DETECTED Final   Streptococcus agalactiae NOT DETECTED NOT DETECTED Final   Streptococcus pneumoniae NOT DETECTED NOT DETECTED Final   Streptococcus pyogenes NOT DETECTED NOT DETECTED Final   A.calcoaceticus-baumannii NOT DETECTED NOT DETECTED Final   Bacteroides fragilis NOT DETECTED NOT DETECTED Final   Enterobacterales NOT DETECTED NOT DETECTED Final   Enterobacter cloacae complex NOT DETECTED NOT DETECTED Final   Escherichia coli NOT DETECTED NOT DETECTED Final   Klebsiella aerogenes NOT DETECTED NOT DETECTED Final   Klebsiella oxytoca NOT DETECTED NOT DETECTED Final   Klebsiella pneumoniae NOT DETECTED NOT DETECTED Final   Proteus species NOT DETECTED NOT DETECTED Final   Salmonella species NOT DETECTED NOT DETECTED Final   Serratia marcescens NOT DETECTED NOT DETECTED Final   Haemophilus influenzae NOT DETECTED NOT DETECTED Final   Neisseria meningitidis NOT DETECTED NOT DETECTED Final   Pseudomonas aeruginosa NOT DETECTED NOT DETECTED Final   Stenotrophomonas maltophilia NOT DETECTED NOT DETECTED Final   Candida albicans NOT DETECTED NOT DETECTED Final   Candida auris NOT DETECTED NOT DETECTED Final   Candida glabrata NOT DETECTED NOT DETECTED Final   Candida krusei NOT DETECTED NOT DETECTED Final   Candida parapsilosis NOT DETECTED NOT DETECTED Final   Candida tropicalis NOT DETECTED NOT DETECTED Final   Cryptococcus neoformans/gattii NOT DETECTED NOT DETECTED Final   Meth resistant mecA/C and MREJ NOT DETECTED NOT DETECTED Final    Comment: Performed at [facility], [address] [place], [place] [address]  Urine Culture     Status: None   Collection Time: 06/17/23 11:46 PM   Specimen: Urine, Random  Result Value Ref Range Status   Specimen  Description   Final    URINE, RANDOM Performed at [facility], [address] [place], [place] [address]    Special Requests   Final    NONE Performed at [facility], [address], [place], [place] [address]  Culture   Final    NO GROWTH Performed at [facility], [address], [place], [place] [address]    Report Status 06/19/2023 FINAL  Final  Culture, blood (Routine X 2) w Reflex to ID Panel     Status: None (Preliminary result)   Collection Time: 06/18/23 11:57 PM   Specimen: BLOOD  Result Value Ref Range Status   Specimen Description BLOOD BLOOD LEFT ARM  Final   Special Requests   Final    BOTTLES DRAWN AEROBIC AND ANAEROBIC Blood Culture adequate volume   Culture   Final    NO GROWTH < 12 HOURS Performed at [facility], [address] [place], [place] [address]    Report Status PENDING  Incomplete  Culture, blood (Routine X 2) w Reflex to ID Panel     Status: None (Preliminary result)   Collection Time: 06/19/23 12:18 AM   Specimen: BLOOD  Result Value Ref Range Status   Specimen Description BLOOD BLOOD LEFT ARM  Final   Special Requests   Final    BOTTLES DRAWN AEROBIC AND ANAEROBIC Blood Culture results may not be optimal due to an inadequate volume of blood received in culture bottles   Culture   Final    NO GROWTH < 12 HOURS Performed at [facility], [address], [place], [place] [address]    Report Status PENDING  Incomplete  C Difficile Quick Screen w PCR reflex     Status: None   Collection Time: 06/19/23  9:40 AM   Specimen: Catheter Tip; Stool  Result Value Ref Range Status   C Diff antigen NEGATIVE NEGATIVE Final   C Diff toxin NEGATIVE NEGATIVE Final   C Diff interpretation No C. difficile detected.  Final    Comment: Performed at [facility], [address] [place], [place] [address]  Gastrointestinal Panel by PCR , Stool     Status: Abnormal   Collection Time: 06/19/23  9:40 AM    Specimen: Catheter Tip; Stool  Result Value Ref Range Status   Campylobacter species NOT DETECTED NOT DETECTED Final   Plesimonas shigelloides NOT DETECTED NOT DETECTED Final   Salmonella species NOT DETECTED NOT DETECTED Final   Yersinia enterocolitica NOT DETECTED NOT DETECTED Final   Vibrio species NOT DETECTED NOT DETECTED Final   Vibrio cholerae NOT DETECTED NOT DETECTED Final   Enteroaggregative E coli (EAEC) NOT DETECTED NOT DETECTED Final   Enteropathogenic E coli (EPEC) NOT DETECTED NOT DETECTED Final   Enterotoxigenic E coli (ETEC) NOT DETECTED NOT DETECTED Final   Shiga like toxin producing E coli (STEC) NOT DETECTED NOT DETECTED Final   Shigella/Enteroinvasive E coli (EIEC) NOT DETECTED NOT DETECTED Final   Cryptosporidium NOT DETECTED NOT DETECTED Final   Cyclospora cayetanensis NOT DETECTED NOT DETECTED Final   Entamoeba histolytica NOT DETECTED NOT DETECTED Final   Giardia lamblia NOT DETECTED NOT DETECTED Final   Adenovirus F40/41 NOT DETECTED NOT DETECTED Final   Astrovirus NOT DETECTED NOT DETECTED Final   Norovirus GI/GII DETECTED (A) NOT DETECTED Final   Rotavirus A NOT DETECTED NOT DETECTED Final    Comment: RESULT CALLED TO, READ BACK BY AND VERIFIED WITH: Deyanne Forester, RN 1202 06/19/23 GM    Sapovirus (I, II, IV, and V) NOT DETECTED NOT DETECTED Final    Comment: Performed at [facility], [address] [place], [place] [address]     Serology:    Imaging: If present, new imagings (plain films,  ct scans, and mri) have been personally visualized and interpreted; radiology reports have been reviewed. Decision making incorporated into the Impression / Recommendations.  6/11 cxr No active cardiopulm disease  Jamesetta Mcbride, MD [facility] [phone] pager    06/19/2023, 4:07 PM

## 2023-06-19 NOTE — Progress Notes (Signed)
*  PRELIMINARY RESULTS* Echocardiogram 2D Echocardiogram has been performed.  Broadus Canes 06/19/2023, 11:55 AM

## 2023-06-19 NOTE — Progress Notes (Signed)
#  MSSA bacteremia #port for chemotherapy -start cefazolin  -tte,will need tee -repeat blood Cx - will need port removal -official ID consult [email or web address]

## 2023-06-19 NOTE — Plan of Care (Signed)
  Problem: Fluid Volume: Goal: Hemodynamic stability will improve Outcome: Progressing   Problem: Clinical Measurements: Goal: Diagnostic test results will improve Outcome: Progressing Goal: Signs and symptoms of infection will decrease Outcome: Progressing   Problem: Respiratory: Goal: Ability to maintain adequate ventilation will improve Outcome: Progressing   Problem: Education: Goal: Ability to describe self-care measures that may prevent or decrease complications (Diabetes Survival Skills Education) will improve Outcome: Progressing Goal: Individualized Educational Video(s) Outcome: Progressing   Problem: Coping: Goal: Ability to adjust to condition or change in health will improve Outcome: Progressing   Problem: Fluid Volume: Goal: Ability to maintain a balanced intake and output will improve Outcome: Progressing   Problem: Health Behavior/Discharge Planning: Goal: Ability to identify and utilize available resources and services will improve Outcome: Progressing Goal: Ability to manage health-related needs will improve Outcome: Progressing   Problem: Metabolic: Goal: Ability to maintain appropriate glucose levels will improve Outcome: Progressing   Problem: Nutritional: Goal: Maintenance of adequate nutrition will improve Outcome: Progressing Goal: Progress toward achieving an optimal weight will improve Outcome: Progressing   Problem: Skin Integrity: Goal: Risk for impaired skin integrity will decrease Outcome: Progressing   Problem: Tissue Perfusion: Goal: Adequacy of tissue perfusion will improve Outcome: Progressing   Problem: Education: Goal: Knowledge of General Education information will improve Description: Including pain rating scale, medication(s)/side effects and non-pharmacologic comfort measures Outcome: Progressing   Problem: Health Behavior/Discharge Planning: Goal: Ability to manage health-related needs will improve Outcome:  Progressing   Problem: Clinical Measurements: Goal: Ability to maintain clinical measurements within normal limits will improve Outcome: Progressing Goal: Will remain free from infection Outcome: Progressing Goal: Diagnostic test results will improve Outcome: Progressing Goal: Respiratory complications will improve Outcome: Progressing Goal: Cardiovascular complication will be avoided Outcome: Progressing   Problem: Activity: Goal: Risk for activity intolerance will decrease Outcome: Progressing   Problem: Nutrition: Goal: Adequate nutrition will be maintained Outcome: Progressing   Problem: Coping: Goal: Level of anxiety will decrease Outcome: Progressing   Problem: Elimination: Goal: Will not experience complications related to bowel motility Outcome: Progressing Goal: Will not experience complications related to urinary retention Outcome: Progressing   Problem: Pain Managment: Goal: General experience of comfort will improve and/or be controlled Outcome: Progressing   Problem: Safety: Goal: Ability to remain free from injury will improve Outcome: Progressing   Problem: Skin Integrity: Goal: Risk for impaired skin integrity will decrease Outcome: Progressing   Problem: Education: Goal: Knowledge of General Education information will improve Description: Including pain rating scale, medication(s)/side effects and non-pharmacologic comfort measures Outcome: Progressing

## 2023-06-19 NOTE — Care Management Important Message (Signed)
 Important Message  Patient Details  Name: Kathleen Russell MRN: [id number] Date of Birth: 24-Jan-1955   Important Message Given:  Yes - Medicare IM     Jalasia Eskridge W, CMA 06/19/2023, 1:32 PM

## 2023-06-20 DIAGNOSIS — B9561 Methicillin susceptible Staphylococcus aureus infection as the cause of diseases classified elsewhere: Secondary | ICD-10-CM | POA: Diagnosis not present

## 2023-06-20 DIAGNOSIS — R7881 Bacteremia: Secondary | ICD-10-CM | POA: Diagnosis not present

## 2023-06-20 LAB — CBC WITH DIFFERENTIAL/PLATELET
Abs Immature Granulocytes: 0 10*3/uL (ref 0.00–0.07)
Basophils Absolute: 0 10*3/uL (ref 0.0–0.1)
Basophils Relative: 1 %
Eosinophils Absolute: 0 10*3/uL (ref 0.0–0.5)
Eosinophils Relative: 2 %
HCT: 31.5 % — ABNORMAL LOW (ref 36.0–46.0)
Hemoglobin: 10.5 g/dL — ABNORMAL LOW (ref 12.0–15.0)
Immature Granulocytes: 0 %
Lymphocytes Relative: 56 %
Lymphs Abs: 1 10*3/uL (ref 0.7–4.0)
MCH: 28.6 pg (ref 26.0–34.0)
MCHC: 33.3 g/dL (ref 30.0–36.0)
MCV: 85.8 fL (ref 80.0–100.0)
Monocytes Absolute: 0.3 10*3/uL (ref 0.1–1.0)
Monocytes Relative: 15 %
Neutro Abs: 0.5 10*3/uL — ABNORMAL LOW (ref 1.7–7.7)
Neutrophils Relative %: 26 %
Platelets: 126 10*3/uL — ABNORMAL LOW (ref 150–400)
RBC: 3.67 MIL/uL — ABNORMAL LOW (ref 3.87–5.11)
RDW: 14.2 % (ref 11.5–15.5)
Smear Review: NORMAL
WBC: 1.7 10*3/uL — ABNORMAL LOW (ref 4.0–10.5)
nRBC: 0 % (ref 0.0–0.2)

## 2023-06-20 LAB — BASIC METABOLIC PANEL WITH GFR
Anion gap: 9 (ref 5–15)
BUN: 7 mg/dL — ABNORMAL LOW (ref 8–23)
CO2: 25 mmol/L (ref 22–32)
Calcium: 7.9 mg/dL — ABNORMAL LOW (ref 8.9–10.3)
Chloride: 105 mmol/L (ref 98–111)
Creatinine, Ser: 0.53 mg/dL (ref 0.44–1.00)
GFR, Estimated: >60 mL/min (ref >60)
Glucose, Bld: 161 mg/dL — ABNORMAL HIGH (ref 70–99)
Potassium: 3.2 mmol/L — ABNORMAL LOW (ref 3.5–5.1)
Sodium: 139 mmol/L (ref 135–145)

## 2023-06-20 LAB — CULTURE, BLOOD (ROUTINE X 2): Special Requests: ADEQUATE

## 2023-06-20 LAB — MAGNESIUM: Magnesium: 2.1 mg/dL (ref 1.7–2.4)

## 2023-06-20 LAB — PHOSPHORUS: Phosphorus: 2.8 mg/dL (ref 2.5–4.6)

## 2023-06-20 LAB — GLUCOSE, CAPILLARY
Glucose-Capillary: 169 mg/dL — ABNORMAL HIGH (ref 70–99)
Glucose-Capillary: 258 mg/dL — ABNORMAL HIGH (ref 70–99)
Glucose-Capillary: 111 mg/dL — ABNORMAL HIGH (ref 70–99)
Glucose-Capillary: 221 mg/dL — ABNORMAL HIGH (ref 70–99)

## 2023-06-20 MED ORDER — POTASSIUM CHLORIDE CRYS ER 20 MEQ PO TBCR
40.0000 meq | EXTENDED_RELEASE_TABLET | Freq: Two times a day (BID) | ORAL | Status: AC
  Administered 2023-06-20 – 2023-06-21 (×2): 40 meq via ORAL
  Filled 2023-06-20 (×2): qty 2

## 2023-06-20 MED ORDER — POTASSIUM CHLORIDE CRYS ER 20 MEQ PO TBCR: 40.0000 meq | EXTENDED_RELEASE_TABLET | Freq: Once | ORAL | Status: DC

## 2023-06-20 NOTE — Progress Notes (Signed)
 PROGRESS NOTE    Kathleen [place]  UJW:[id number] DOB: Mar 28, 1955 DOA: 06/17/2023 PCP: Thersia Flax, MD    Assessment & Plan:   Principal Problem:   SIRS (systemic inflammatory response syndrome) (HCC) Active Problems:   Dyslipidemia   Type 2 diabetes mellitus without complications (HCC)   Pancreatic cancer (HCC)  Assessment and Plan:  MSSA bacteremia: etiology unclear, possibly secondary to chemo port. S/p chemo port removed 06/19/23. Continue on IV cefazolin  as per ID. Repeat blood cxs NGTD. Echo was neg for vegetations. Cardio consulted for TEE as per request of ID.    Sinus tachycardia: s/p cardizem  IV x 1. Continue on tele    Diarrhea: norovirus was found on GI PCR panel. Continue w/ supportive care    Hypokalemia: potassium given   Hypophosphatemia: WNL today   Hypomagnesemia: WNL today   Pancreatic adenocarcinoma: stage II. On chemo. Will f/u outpatient w/ onco, Dr. Randy Buttery   Pancytopenia: likely secondary to recent chemo. No need for a transfusion currently    DM2: fair control, HbA1c 7.4. Continue on SSI w/ accuchecks. Holding home tradjenta     HLD: continue on statin     DVT prophylaxis: lovenox   Code Status: full  Family Communication: discussed pt's care w/ pt's husband, [name], and answered his questions  Disposition Plan: likely d/c back home   Level of care: Telemetry Medical  Status is: Inpatient Remains inpatient appropriate because: severity of illness    Consultants:  ID  Procedures:   Antimicrobials: cefazolin     Subjective: Pt c/o diarrhea  Objective: Vitals:   06/19/23 1511 06/19/23 2049 06/19/23 2357 06/20/23 0354  BP: 104/68 112/61 110/69 112/68  Pulse: (!) 112 (!) 117 99   Resp: 14 18  18   Temp: (!) 100.6 F (38.1 C) 99.3 F (37.4 C) 98.3 F (36.8 C) 98.7 F (37.1 C)  TempSrc:  Oral    SpO2: 95% 94% 97%   Weight:      Height:        Intake/Output Summary (Last 24 hours) at 06/20/2023 0806 Last data filed at  06/20/2023 0442 Gross per 24 hour  Intake 690 ml  Output --  Net 690 ml   Filed Weights   06/17/23 2108  Weight: 74.8 kg    Examination:  General exam: Appears calm & comfortable  Respiratory system: clear breath sounds b/l  Cardiovascular system:S1 & S2+. No rubs or clicks  Gastrointestinal system: abd is soft, NT, ND & hypoactive bowel sounds Central nervous system: alert & oriented. Moves all extremities  Psychiatry: judgement and insight appears normal. Flat mood and affect    Data Reviewed: I have personally reviewed following labs and imaging studies  CBC: Recent Labs  Lab 06/17/23 2227 06/18/23 0259 06/19/23 0602 06/20/23 0515  WBC 3.1* 2.9* 1.5* 1.7*  NEUTROABS 2.0  --   --  0.5*  HGB 12.6 11.7* 10.2* 10.5*  HCT 39.1 36.2 29.7* 31.5*  MCV 87.3 89.6 85.3 85.8  PLT 151 114* 111* 126*   Basic Metabolic Panel: Recent Labs  Lab 06/17/23 2227 06/18/23 0935 06/18/23 2357 06/19/23 0602 06/20/23 0515  NA 131* 133* 132* 133* 139  K 3.5 3.0* 3.3* 3.5 3.2*  CL 99 103 102 101 105  CO2 23 22 22 23 25   GLUCOSE 200* 212* 122* 131* 161*  BUN 11 8 7* 6* 7*  CREATININE 0.70 0.53 0.48 0.67 0.53  CALCIUM  8.3* 7.6* 7.6* 7.6* 7.9*  MG  --  1.6* 1.9 1.8 2.1  PHOS  --  2.2* 2.8 3.0 2.8   GFR: Estimated Creatinine Clearance: 66.6 mL/min (by C-G formula based on SCr of 0.53 mg/dL). Liver Function Tests: Recent Labs  Lab 06/17/23 2227  AST 14*  ALT 12  ALKPHOS 56  BILITOT 0.5  PROT 5.9*  ALBUMIN 3.0*   No results for input(s): LIPASE, AMYLASE in the last 168 hours. No results for input(s): AMMONIA in the last 168 hours. Coagulation Profile: Recent Labs  Lab 06/17/23 2212 06/18/23 0935  INR 1.0 1.2   Cardiac Enzymes: No results for input(s): CKTOTAL, CKMB, CKMBINDEX, TROPONINI in the last 168 hours. BNP (last 3 results) No results for input(s): PROBNP in the last 8760 hours. HbA1C: No results for input(s): HGBA1C in the last 72  hours. CBG: Recent Labs  Lab 06/18/23 2036 06/19/23 0805 06/19/23 1108 06/19/23 1512 06/19/23 2207  GLUCAP 149* 119* 125* 136* 186*   Lipid Profile: No results for input(s): CHOL, HDL, LDLCALC, TRIG, CHOLHDL, LDLDIRECT in the last 72 hours. Thyroid  Function Tests: Recent Labs    06/18/23 0935  TSH 2.506  FREET4 0.85   Anemia Panel: No results for input(s): VITAMINB12, FOLATE, FERRITIN, TIBC, IRON, RETICCTPCT in the last 72 hours. Sepsis Labs: Recent Labs  Lab 06/17/23 2212 06/17/23 2346  LATICACIDVEN 1.5 1.0    Recent Results (from the past 240 hours)  Culture, blood (Routine x 2)     Status: Abnormal (Preliminary result)   Collection Time: 06/17/23 10:12 PM   Specimen: Right Antecubital; Blood  Result Value Ref Range Status   Specimen Description   Final    RIGHT ANTECUBITAL Performed at [facility], [address], [place], [place] [address]    Special Requests   Final    BOTTLES DRAWN AEROBIC AND ANAEROBIC Blood Culture adequate volume Performed at [facility], [address] [place], [place] [address]    Culture  Setup Time   Final    GRAM POSITIVE COCCI IN BOTH AEROBIC AND ANAEROBIC BOTTLES CRITICAL VALUE NOTED.  VALUE IS CONSISTENT WITH PREVIOUSLY REPORTED AND CALLED VALUE. Performed at [facility], [address], [place], [place] [address]    Culture STAPHYLOCOCCUS AUREUS (A)  Final   Report Status PENDING  Incomplete  Resp panel by RT-PCR (RSV, Flu A&B, Covid) Anterior Nasal Swab     Status: None   Collection Time: 06/17/23 10:12 PM   Specimen: Anterior Nasal Swab  Result Value Ref Range Status   SARS Coronavirus 2 by RT PCR NEGATIVE NEGATIVE Final    Comment: (NOTE) SARS-CoV-2 target nucleic acids are NOT DETECTED.  The SARS-CoV-2 RNA is generally detectable in upper respiratory specimens during the acute phase of infection. The lowest concentration of SARS-CoV-2 viral copies this assay  can detect is 138 copies/mL. A negative result does not preclude SARS-Cov-2 infection and should not be used as the sole basis for treatment or other patient management decisions. A negative result may occur with  improper specimen collection/handling, submission of specimen other than nasopharyngeal swab, presence of viral mutation(s) within the areas targeted by this assay, and inadequate number of viral copies(<138 copies/mL). A negative result must be combined with clinical observations, patient history, and epidemiological information. The expected result is Negative.  Fact Sheet for Patients:  BloggerCourse.com  Fact Sheet for Healthcare Providers:  SeriousBroker.it  This test is no t yet approved or cleared by the [place]  FDA and  has been authorized for detection and/or diagnosis of SARS-CoV-2 by FDA under an Emergency Use Authorization (EUA). This  EUA will remain  in effect (meaning this test can be used) for the duration of the COVID-19 declaration under Section 564(b)(1) of the Act, 21 U.S.C.section 360bbb-3(b)(1), unless the authorization is terminated  or revoked sooner.       Influenza A by PCR NEGATIVE NEGATIVE Final   Influenza B by PCR NEGATIVE NEGATIVE Final    Comment: (NOTE) The Xpert Xpress SARS-CoV-2/FLU/RSV plus assay is intended as an aid in the diagnosis of influenza from Nasopharyngeal swab specimens and should not be used as a sole basis for treatment. Nasal washings and aspirates are unacceptable for Xpert Xpress SARS-CoV-2/FLU/RSV testing.  Fact Sheet for Patients: BloggerCourse.com  Fact Sheet for Healthcare Providers: SeriousBroker.it  This test is not yet approved or cleared by the [place]  FDA and has been authorized for detection and/or diagnosis of SARS-CoV-2 by FDA under an Emergency Use Authorization (EUA). This EUA will  remain in effect (meaning this test can be used) for the duration of the COVID-19 declaration under Section 564(b)(1) of the Act, 21 U.S.C. section 360bbb-3(b)(1), unless the authorization is terminated or revoked.     Resp Syncytial Virus by PCR NEGATIVE NEGATIVE Final    Comment: (NOTE) Fact Sheet for Patients: BloggerCourse.com  Fact Sheet for Healthcare Providers: SeriousBroker.it  This test is not yet approved or cleared by the [place]  FDA and has been authorized for detection and/or diagnosis of SARS-CoV-2 by FDA under an Emergency Use Authorization (EUA). This EUA will remain in effect (meaning this test can be used) for the duration of the COVID-19 declaration under Section 564(b)(1) of the Act, 21 U.S.C. section 360bbb-3(b)(1), unless the authorization is terminated or revoked.  Performed at [facility], [address] [place], [place] [address]   Culture, blood (Routine x 2)     Status: Abnormal (Preliminary result)   Collection Time: 06/17/23 10:17 PM   Specimen: BLOOD RIGHT HAND  Result Value Ref Range Status   Specimen Description   Final    BLOOD RIGHT HAND Performed at [facility], [address], [place], [place] [address]    Special Requests   Final    BOTTLES DRAWN AEROBIC AND ANAEROBIC Blood Culture results may not be optimal due to an inadequate volume of blood received in culture bottles Performed at [facility], [address] [place], [place] [address]    Culture  Setup Time   Final    GRAM POSITIVE COCCI IN BOTH AEROBIC AND ANAEROBIC BOTTLES CRITICAL RESULT CALLED TO, READ BACK BY AND VERIFIED WITH: EMILY STEINBOCK 06/18/23 1414 MW    Culture (A)  Final    STAPHYLOCOCCUS AUREUS SUSCEPTIBILITIES TO FOLLOW Performed at [facility], [address], [place], [place] [address]    Report Status PENDING  Incomplete  Blood Culture ID Panel (Reflexed)      Status: Abnormal   Collection Time: 06/17/23 10:17 PM  Result Value Ref Range Status   Enterococcus faecalis NOT DETECTED NOT DETECTED Final   Enterococcus Faecium NOT DETECTED NOT DETECTED Final   Listeria monocytogenes NOT DETECTED NOT DETECTED Final   Staphylococcus species DETECTED (A) NOT DETECTED Final    Comment: CRITICAL RESULT CALLED TO, READ BACK BY AND VERIFIED WITH: EMILY STEINBOCK 06/18/23 1414 MW    Staphylococcus aureus (BCID) DETECTED (A) NOT DETECTED Final    Comment: CRITICAL RESULT CALLED TO, READ BACK BY AND VERIFIED WITH: EMILY STEINBOCK 06/18/23 1414 MW    Staphylococcus epidermidis NOT DETECTED NOT DETECTED Final   Staphylococcus  lugdunensis NOT DETECTED NOT DETECTED Final   Streptococcus species NOT DETECTED NOT DETECTED Final   Streptococcus agalactiae NOT DETECTED NOT DETECTED Final   Streptococcus pneumoniae NOT DETECTED NOT DETECTED Final   Streptococcus pyogenes NOT DETECTED NOT DETECTED Final   A.calcoaceticus-baumannii NOT DETECTED NOT DETECTED Final   Bacteroides fragilis NOT DETECTED NOT DETECTED Final   Enterobacterales NOT DETECTED NOT DETECTED Final   Enterobacter cloacae complex NOT DETECTED NOT DETECTED Final   Escherichia coli NOT DETECTED NOT DETECTED Final   Klebsiella aerogenes NOT DETECTED NOT DETECTED Final   Klebsiella oxytoca NOT DETECTED NOT DETECTED Final   Klebsiella pneumoniae NOT DETECTED NOT DETECTED Final   Proteus species NOT DETECTED NOT DETECTED Final   Salmonella species NOT DETECTED NOT DETECTED Final   Serratia marcescens NOT DETECTED NOT DETECTED Final   Haemophilus influenzae NOT DETECTED NOT DETECTED Final   Neisseria meningitidis NOT DETECTED NOT DETECTED Final   Pseudomonas aeruginosa NOT DETECTED NOT DETECTED Final   Stenotrophomonas maltophilia NOT DETECTED NOT DETECTED Final   Candida albicans NOT DETECTED NOT DETECTED Final   Candida auris NOT DETECTED NOT DETECTED Final   Candida glabrata NOT DETECTED NOT DETECTED  Final   Candida krusei NOT DETECTED NOT DETECTED Final   Candida parapsilosis NOT DETECTED NOT DETECTED Final   Candida tropicalis NOT DETECTED NOT DETECTED Final   Cryptococcus neoformans/gattii NOT DETECTED NOT DETECTED Final   Meth resistant mecA/C and MREJ NOT DETECTED NOT DETECTED Final    Comment: Performed at [facility], [address], [place], [place] [address]  Urine Culture     Status: None   Collection Time: 06/17/23 11:46 PM   Specimen: Urine, Random  Result Value Ref Range Status   Specimen Description   Final    URINE, RANDOM Performed at [facility], [address] [place], [place] [address]    Special Requests   Final    NONE Performed at [facility], [address], [place], [place] [address]    Culture   Final    NO GROWTH Performed at [facility], [address], [place], [place] [address]    Report Status 06/19/2023 FINAL  Final  Culture, blood (Routine X 2) w Reflex to ID Panel     Status: None (Preliminary result)   Collection Time: 06/18/23 11:57 PM   Specimen: BLOOD  Result Value Ref Range Status   Specimen Description BLOOD BLOOD LEFT ARM  Final   Special Requests   Final    BOTTLES DRAWN AEROBIC AND ANAEROBIC Blood Culture adequate volume   Culture   Final    NO GROWTH 1 DAY Performed at [facility], [address], [place], [place] [address]    Report Status PENDING  Incomplete  Culture, blood (Routine X 2) w Reflex to ID Panel     Status: None (Preliminary result)   Collection Time: 06/19/23 12:18 AM   Specimen: BLOOD  Result Value Ref Range Status   Specimen Description BLOOD BLOOD LEFT ARM  Final   Special Requests   Final    BOTTLES DRAWN AEROBIC AND ANAEROBIC Blood Culture results may not be optimal due to an inadequate volume of blood received in culture bottles   Culture   Final    NO GROWTH 1 DAY Performed at [facility], [address] [place], [place] [address]     Report Status PENDING  Incomplete  C Difficile Quick Screen w PCR reflex  Status: None   Collection Time: 06/19/23  9:40 AM   Specimen: Catheter Tip; Stool  Result Value Ref Range Status   C Diff antigen NEGATIVE NEGATIVE Final   C Diff toxin NEGATIVE NEGATIVE Final   C Diff interpretation No C. difficile detected.  Final    Comment: Performed at [facility], [address] [place], [place] [address]  Gastrointestinal Panel by PCR , Stool     Status: Abnormal   Collection Time: 06/19/23  9:40 AM   Specimen: Catheter Tip; Stool  Result Value Ref Range Status   Campylobacter species NOT DETECTED NOT DETECTED Final   Plesimonas shigelloides NOT DETECTED NOT DETECTED Final   Salmonella species NOT DETECTED NOT DETECTED Final   Yersinia enterocolitica NOT DETECTED NOT DETECTED Final   Vibrio species NOT DETECTED NOT DETECTED Final   Vibrio cholerae NOT DETECTED NOT DETECTED Final   Enteroaggregative E coli (EAEC) NOT DETECTED NOT DETECTED Final   Enteropathogenic E coli (EPEC) NOT DETECTED NOT DETECTED Final   Enterotoxigenic E coli (ETEC) NOT DETECTED NOT DETECTED Final   Shiga like toxin producing E coli (STEC) NOT DETECTED NOT DETECTED Final   Shigella/Enteroinvasive E coli (EIEC) NOT DETECTED NOT DETECTED Final   Cryptosporidium NOT DETECTED NOT DETECTED Final   Cyclospora cayetanensis NOT DETECTED NOT DETECTED Final   Entamoeba histolytica NOT DETECTED NOT DETECTED Final   Giardia lamblia NOT DETECTED NOT DETECTED Final   Adenovirus F40/41 NOT DETECTED NOT DETECTED Final   Astrovirus NOT DETECTED NOT DETECTED Final   Norovirus GI/GII DETECTED (A) NOT DETECTED Final   Rotavirus A NOT DETECTED NOT DETECTED Final    Comment: RESULT CALLED TO, READ BACK BY AND VERIFIED WITH: Deyanne Forester, RN 1202 06/19/23 GM    Sapovirus (I, II, IV, and V) NOT DETECTED NOT DETECTED Final    Comment: Performed at [facility], [address] [place], [place] [address]          Radiology Studies: ECHOCARDIOGRAM COMPLETE BUBBLE STUDY Result Date: 06/19/2023    ECHOCARDIOGRAM REPORT   Patient Name:   Kathleen [place] Date of Exam: 06/19/2023 Medical Rec #:  [id number]        Height:       64.0 in Accession #:    [id number]       Weight:       165.0 lb Date of Birth:  [birth year]         BSA:          1.803 m Patient Age:    67 years         BP:           119/78 mmHg Patient Gender: F                HR:           121 bpm. Exam Location:  [facility] Procedure: 2D Echo, Cardiac Doppler, Color Doppler and Saline Contrast Bubble            Study (Both Spectral and Color Flow Doppler were utilized during            procedure). Indications:     Endocarditis  History:         Patient has no prior history of Echocardiogram examinations.                  Risk Factors:Diabetes.  Sonographer:     Broadus Canes Referring Phys:  [id number] [facility] Diagnosing Phys: Veryl Gottron End  MD IMPRESSIONS  1. Left ventricular ejection fraction, by estimation, is 60 to 65%. The left ventricle has normal function. The left ventricle has no regional wall motion abnormalities. There is mild left ventricular hypertrophy. Indeterminate diastolic filling due to E-A fusion.  2. Right ventricular systolic function is normal. The right ventricular size is normal. Tricuspid regurgitation signal is inadequate for assessing PA pressure.  3. The mitral valve is normal in structure. No evidence of mitral valve regurgitation. No evidence of mitral stenosis.  4. The aortic valve has an indeterminant number of cusps. Aortic valve regurgitation is not visualized. No aortic stenosis is present.  5. Agitated saline contrast bubble study was negative, with no evidence of any interatrial shunt. FINDINGS  Left Ventricle: Left ventricular ejection fraction, by estimation, is 60 to 65%. The left ventricle has normal function. The left ventricle has no regional wall motion abnormalities. The left ventricular internal cavity size was  normal in size. There is  mild left ventricular hypertrophy. Indeterminate diastolic filling due to E-A fusion. Right Ventricle: The right ventricular size is normal. No increase in right ventricular wall thickness. Right ventricular systolic function is normal. Tricuspid regurgitation signal is inadequate for assessing PA pressure. Left Atrium: Left atrial size was normal in size. Right Atrium: Right atrial size was normal in size. Pericardium: The pericardium was not well visualized. Mitral Valve: The mitral valve is normal in structure. No evidence of mitral valve regurgitation. No evidence of mitral valve stenosis. Tricuspid Valve: The tricuspid valve is not well visualized. Tricuspid valve regurgitation is trivial. Aortic Valve: The aortic valve has an indeterminant number of cusps. Aortic valve regurgitation is not visualized. No aortic stenosis is present. Aortic valve mean gradient measures 2.0 mmHg. Aortic valve peak gradient measures 2.9 mmHg. Aortic valve area, by VTI measures 4.14 cm. Pulmonic Valve: The pulmonic valve was not well visualized. Pulmonic valve regurgitation is not visualized. No evidence of pulmonic stenosis. Aorta: The aortic root is normal in size and structure. Pulmonary Artery: The pulmonary artery is not well seen. Venous: The inferior vena cava was not well visualized. IAS/Shunts: The interatrial septum was not well visualized. Agitated saline contrast was given intravenously to evaluate for intracardiac shunting. Agitated saline contrast bubble study was negative, with no evidence of any interatrial shunt.  LEFT VENTRICLE PLAX 2D LVIDd:         3.40 cm   Diastology LVIDs:         2.30 cm   LV e' medial:   5.98 cm/s LV PW:         1.00 cm   LV E/e' medial: 11.5 LV IVS:        1.20 cm LVOT diam:     2.00 cm LV SV:         47 LV SV Index:   26 LVOT Area:     3.14 cm  RIGHT VENTRICLE RV Basal diam:  3.20 cm RV Mid diam:    3.10 cm LEFT ATRIUM           Index        RIGHT ATRIUM            Index LA diam:      2.30 cm 1.28 cm/m   RA Area:     17.40 cm LA Vol (A2C): 20.0 ml 11.09 ml/m  RA Volume:   42.80 ml  23.74 ml/m LA Vol (A4C): 22.4 ml 12.43 ml/m  AORTIC VALVE AV Area (Vmax):    2.87 cm  AV Area (Vmean):   3.05 cm AV Area (VTI):     4.14 cm AV Vmax:           85.65 cm/s AV Vmean:          60.700 cm/s AV VTI:            0.113 m AV Peak Grad:      2.9 mmHg AV Mean Grad:      2.0 mmHg LVOT Vmax:         78.20 cm/s LVOT Vmean:        59.000 cm/s LVOT VTI:          0.149 m LVOT/AV VTI ratio: 1.32  AORTA Ao Root diam: 3.50 cm MITRAL VALVE MV Area (PHT): 6.77 cm     SHUNTS MV Decel Time: 112 msec     Systemic VTI:  0.15 m MV E velocity: 68.70 cm/s   Systemic Diam: 2.00 cm MV A velocity: 124.00 cm/s MV E/A ratio:  0.55 Veryl Gottron End MD Electronically signed by Sammy Crisp MD Signature Date/Time: 06/19/2023/4:47:04 PM    Final    IR REMOVAL TUN ACCESS W/ PORT W/O FL MOD SED Result Date: 06/19/2023 INDICATION: MSSA bacteremia.  Port removal with tip culturing. EXAM: REMOVAL RIGHT IJ VEIN PORT-A-CATH MEDICATIONS: ; The antibiotic was administered within an appropriate time interval prior to skin puncture. ANESTHESIA/SEDATION: Moderate (conscious) sedation was employed during this procedure. A total of Versed  0 mg and Fentanyl  0 mcg was administered intravenously by the radiology nurse. Total intra-service moderate Sedation Time: 0 minutes. The patient's level of consciousness and vital signs were monitored continuously by radiology nursing throughout the procedure under my direct supervision. FLUOROSCOPY: Radiation Exposure Index (as provided by the fluoroscopic device): 0 mGy Kerma COMPLICATIONS: None immediate. PROCEDURE: Informed written consent was obtained from the patient after a thorough discussion of the procedural risks, benefits and alternatives. All questions were addressed. Maximal Sterile Barrier Technique was utilized including caps, mask, sterile gowns, sterile gloves,  sterile drape, hand hygiene and skin antiseptic. A timeout was performed prior to the initiation of the procedure. The right chest was prepped and draped in a sterile fashion. Lidocaine  was utilized for local anesthesia. An incision was made over the previously healed surgical incision. Utilizing blunt dissection, the port catheter and reservoir were removed from the underlying subcutaneous tissue in their entirety. Securing sutures were also removed. The pocket was irrigated with a copious amount of sterile normal saline. The pocket was closed with interrupted 3-0 Vicryl stitches. The subcutaneous tissue was closed with 3-0 Vicryl interrupted subcutaneous stitches. A 4-0 Vicryl running subcuticular stitch was utilized to approximate the skin. Dermabond was applied. IMPRESSION: Successful right IJ vein Port-A-Cath explant. The tip of the catheter was cut and sent to laboratory for culture and sensitivity. Electronically Signed   By: Susan Ensign   On: 06/19/2023 10:11        Scheduled Meds:  cetirizine   10 mg Oral QPM   Chlorhexidine  Gluconate Cloth  6 each Topical Daily   enoxaparin  (LOVENOX ) injection  40 mg Subcutaneous Q24H   feeding supplement  237 mL Oral BID BM   insulin  aspart  0-15 Units Subcutaneous TID WC   lidocaine -EPINEPHrine -tetracaine   3 mL Topical Once   pantoprazole   40 mg Oral Daily   rosuvastatin   20 mg Oral Daily   saccharomyces boulardii  250 mg Oral BID   sodium chloride  flush  10-40 mL Intracatheter Q12H   Continuous Infusions:   ceFAZolin  (ANCEF )  IV Stopped (06/20/23 0442)     LOS: 2 days     Alphonsus Jeans, MD [facility] Pager [phone]  If 7PM-7AM, please contact night-coverage [email or web address] 06/20/2023, 8:06 AM

## 2023-06-20 NOTE — Progress Notes (Signed)
 MEWS Progress Note  Patient Details Name: NONNIE PICKNEY MRN: [id number] DOB: Jun 06, 1955 Today's Date: 06/20/2023   MEWS Flowsheet Documentation:  Assess: MEWS Score Temp: 98.3 F (36.8 C) BP: 110/69 MAP (mmHg): 82 Pulse Rate: 99 ECG Heart Rate: (!) 124 Resp: 18 Level of Consciousness: Alert SpO2: 97 % O2 Device: Room Air Patient Activity (if Appropriate): In bed Assess: MEWS Score MEWS Temp: 0 MEWS Systolic: 0 MEWS Pulse: 0 MEWS RR: 0 MEWS LOC: 0 MEWS Score: 0 MEWS Score Color: Green Assess: SIRS CRITERIA SIRS Temperature : 0 SIRS Respirations : 0 SIRS Pulse: 1 SIRS WBC: 0 SIRS Score Sum : 1 SIRS Temperature : 0 SIRS Pulse: 1 SIRS Respirations : 0 SIRS WBC: 0 SIRS Score Sum : 1 Assess: if the MEWS score is Yellow or Red Were vital signs accurate and taken at a resting state?: Yes Does the patient meet 2 or more of the SIRS criteria?: No MEWS guidelines implemented : No, previously yellow, continue vital signs every 4 hours        Adah Hollering 06/20/2023, 12:36 AM

## 2023-06-20 NOTE — Plan of Care (Signed)
  Problem: Fluid Volume: Goal: Hemodynamic stability will improve Outcome: Progressing   Problem: Clinical Measurements: Goal: Diagnostic test results will improve Outcome: Progressing Goal: Signs and symptoms of infection will decrease Outcome: Progressing   Problem: Respiratory: Goal: Ability to maintain adequate ventilation will improve Outcome: Progressing   Problem: Education: Goal: Ability to describe self-care measures that may prevent or decrease complications (Diabetes Survival Skills Education) will improve Outcome: Progressing Goal: Individualized Educational Video(s) Outcome: Progressing   Problem: Coping: Goal: Ability to adjust to condition or change in health will improve Outcome: Progressing   Problem: Fluid Volume: Goal: Ability to maintain a balanced intake and output will improve Outcome: Progressing   Problem: Health Behavior/Discharge Planning: Goal: Ability to identify and utilize available resources and services will improve Outcome: Progressing Goal: Ability to manage health-related needs will improve Outcome: Progressing   Problem: Metabolic: Goal: Ability to maintain appropriate glucose levels will improve Outcome: Progressing   Problem: Nutritional: Goal: Maintenance of adequate nutrition will improve Outcome: Progressing Goal: Progress toward achieving an optimal weight will improve Outcome: Progressing   Problem: Skin Integrity: Goal: Risk for impaired skin integrity will decrease Outcome: Progressing   Problem: Tissue Perfusion: Goal: Adequacy of tissue perfusion will improve Outcome: Progressing   Problem: Education: Goal: Knowledge of General Education information will improve Description: Including pain rating scale, medication(s)/side effects and non-pharmacologic comfort measures Outcome: Progressing   Problem: Health Behavior/Discharge Planning: Goal: Ability to manage health-related needs will improve Outcome:  Progressing   Problem: Clinical Measurements: Goal: Ability to maintain clinical measurements within normal limits will improve Outcome: Progressing Goal: Will remain free from infection Outcome: Progressing Goal: Diagnostic test results will improve Outcome: Progressing Goal: Respiratory complications will improve Outcome: Progressing Goal: Cardiovascular complication will be avoided Outcome: Progressing   Problem: Activity: Goal: Risk for activity intolerance will decrease Outcome: Progressing   Problem: Nutrition: Goal: Adequate nutrition will be maintained Outcome: Progressing   Problem: Coping: Goal: Level of anxiety will decrease Outcome: Progressing   Problem: Elimination: Goal: Will not experience complications related to bowel motility Outcome: Progressing Goal: Will not experience complications related to urinary retention Outcome: Progressing   Problem: Pain Managment: Goal: General experience of comfort will improve and/or be controlled Outcome: Progressing   Problem: Safety: Goal: Ability to remain free from injury will improve Outcome: Progressing   Problem: Skin Integrity: Goal: Risk for impaired skin integrity will decrease Outcome: Progressing   Problem: Education: Goal: Knowledge of General Education information will improve Description: Including pain rating scale, medication(s)/side effects and non-pharmacologic comfort measures Outcome: Progressing

## 2023-06-21 DIAGNOSIS — R7881 Bacteremia: Secondary | ICD-10-CM | POA: Diagnosis not present

## 2023-06-21 DIAGNOSIS — B9561 Methicillin susceptible Staphylococcus aureus infection as the cause of diseases classified elsewhere: Secondary | ICD-10-CM | POA: Diagnosis not present

## 2023-06-21 LAB — BASIC METABOLIC PANEL WITH GFR
Anion gap: 7 (ref 5–15)
BUN: 10 mg/dL (ref 8–23)
CO2: 26 mmol/L (ref 22–32)
Calcium: 8.2 mg/dL — ABNORMAL LOW (ref 8.9–10.3)
Chloride: 104 mmol/L (ref 98–111)
Creatinine, Ser: 0.54 mg/dL (ref 0.44–1.00)
GFR, Estimated: >60 mL/min (ref >60)
Glucose, Bld: 168 mg/dL — ABNORMAL HIGH (ref 70–99)
Potassium: 3.7 mmol/L (ref 3.5–5.1)
Sodium: 137 mmol/L (ref 135–145)

## 2023-06-21 LAB — GLUCOSE, CAPILLARY
Glucose-Capillary: 155 mg/dL — ABNORMAL HIGH (ref 70–99)
Glucose-Capillary: 167 mg/dL — ABNORMAL HIGH (ref 70–99)
Glucose-Capillary: 221 mg/dL — ABNORMAL HIGH (ref 70–99)
Glucose-Capillary: 126 mg/dL — ABNORMAL HIGH (ref 70–99)

## 2023-06-21 LAB — CBC WITH DIFFERENTIAL/PLATELET
Abs Immature Granulocytes: 0.02 10*3/uL (ref 0.00–0.07)
Basophils Absolute: 0 10*3/uL (ref 0.0–0.1)
Basophils Relative: 1 %
Eosinophils Absolute: 0 10*3/uL (ref 0.0–0.5)
Eosinophils Relative: 1 %
HCT: 30.4 % — ABNORMAL LOW (ref 36.0–46.0)
Hemoglobin: 10.4 g/dL — ABNORMAL LOW (ref 12.0–15.0)
Immature Granulocytes: 1 %
Lymphocytes Relative: 53 %
Lymphs Abs: 1.1 10*3/uL (ref 0.7–4.0)
MCH: 28.9 pg (ref 26.0–34.0)
MCHC: 34.2 g/dL (ref 30.0–36.0)
MCV: 84.4 fL (ref 80.0–100.0)
Monocytes Absolute: 0.4 10*3/uL (ref 0.1–1.0)
Monocytes Relative: 20 %
Neutro Abs: 0.5 10*3/uL — ABNORMAL LOW (ref 1.7–7.7)
Neutrophils Relative %: 24 %
Platelets: 152 10*3/uL (ref 150–400)
RBC: 3.6 MIL/uL — ABNORMAL LOW (ref 3.87–5.11)
RDW: 14.2 % (ref 11.5–15.5)
Smear Review: NORMAL
WBC: 2.1 10*3/uL — ABNORMAL LOW (ref 4.0–10.5)
nRBC: 0 % (ref 0.0–0.2)

## 2023-06-21 LAB — PHOSPHORUS: Phosphorus: 2.7 mg/dL (ref 2.5–4.6)

## 2023-06-21 LAB — MAGNESIUM: Magnesium: 2.1 mg/dL (ref 1.7–2.4)

## 2023-06-21 MED ORDER — SODIUM CHLORIDE 0.9% FLUSH: 3.0000 mL | INTRAVENOUS | Status: DC | PRN

## 2023-06-21 MED ORDER — SODIUM CHLORIDE 0.9% FLUSH
3.0000 mL | Freq: Two times a day (BID) | INTRAVENOUS | Status: DC
  Administered 2023-06-21 – 2023-06-23 (×5): 10 mL via INTRAVENOUS
  Administered 2023-06-24: 3 mL via INTRAVENOUS

## 2023-06-21 NOTE — Progress Notes (Signed)
   [facility] has been requested to perform a transesophageal echocardiogram on Kathleen Russell for SBE.  After careful review of history and examination, the risks and benefits of transesophageal echocardiogram have been explained including risks of esophageal damage, perforation (1:10,000 risk), bleeding, pharyngeal hematoma as well as other potential complications associated with conscious sedation including aspiration, arrhythmia, respiratory failure and death. Alternatives to treatment were discussed, questions were answered. Patient is willing to proceed.  I will keep Pt NPO after midnight tonight and our team will work with [facility] to schedule the TEE tomorrow morning.  Based on current schedule, Pt is aware that it is likely that this may not be able to be performed until Tuesday at the earliest.  Laneta Pintos, NP  06/21/2023 12:27 PM

## 2023-06-21 NOTE — Progress Notes (Signed)
 PROGRESS NOTE    KAILIANA GRANQUIST  EAV:[id number] DOB: 03/27/1955 DOA: 06/17/2023 PCP: Thersia Flax, MD    Assessment & Plan:   Principal Problem:   SIRS (systemic inflammatory response syndrome) (HCC) Active Problems:   Dyslipidemia   Type 2 diabetes mellitus without complications (HCC)   Pancreatic cancer (HCC)  Assessment and Plan:  MSSA bacteremia: etiology unclear, possibly secondary to chemo port. S/p chemo port removed 06/19/23. Continue on IV cefazolin  as per ID. Repeat blood cxs NGTD. Echo was neg for vegetations. TEE tomorrow morning as per cardio. Cardio recs apprec    Sinus tachycardia: s/p cardizem  IV x 1. Continue on tele    Diarrhea: norovirus was found on GI PCR panel. Continue w/ supportive care    Hypokalemia: WNL today   Hypophosphatemia: WNL today   Hypomagnesemia: WNL today   Pancreatic adenocarcinoma: stage II. On chemo. Will f/u outpatient w/ onco, Dr. Jinx Mourning: likely secondary to recent chemo. Platelets are back WNL today. No need for a transfusion currently    DM2: fair control, HbA1c 7.4. Continue on SSI w/ accuchecks   HLD: continue on statin     DVT prophylaxis: lovenox   Code Status: full  Family Communication:  Disposition Plan: likely d/c back home   Level of care: Telemetry Medical  Status is: Inpatient Remains inpatient appropriate because: severity of illness, waiting on TEE     Consultants:  ID Cardio   Procedures:   Antimicrobials: cefazolin     Subjective: Pt c/o diarrhea still   Objective: Vitals:   06/20/23 0822 06/20/23 1620 06/21/23 0501 06/21/23 0752  BP: 109/75 109/72 118/76 102/69  Pulse: (!) 108 (!) 103 (!) 102 92  Resp: 18 20 20 16   Temp: 98.6 F (37 C) 98.3 F (36.8 C) 98 F (36.7 C) 97.7 F (36.5 C)  TempSrc:  Oral Oral   SpO2: 96% 100% 98% 99%  Weight:      Height:        Intake/Output Summary (Last 24 hours) at 06/21/2023 0759 Last data filed at 06/20/2023 2052 Gross per 24 hour   Intake 840 ml  Output --  Net 840 ml   Filed Weights   06/17/23 2108  Weight: 74.8 kg    Examination:  General exam: appears comfortable  Respiratory system: clear breath sounds b/l  Cardiovascular system: S1/S2+. No rubs or gallops Gastrointestinal system: abd is soft, NT, ND & hypoactive bowel sounds Central nervous system: alert & oriented. Moves all extremities  Psychiatry: judgement and insight appears normal. Appropriate mood and affect     Data Reviewed: I have personally reviewed following labs and imaging studies  CBC: Recent Labs  Lab 06/17/23 2227 06/18/23 0259 06/19/23 0602 06/20/23 0515  WBC 3.1* 2.9* 1.5* 1.7*  NEUTROABS 2.0  --   --  0.5*  HGB 12.6 11.7* 10.2* 10.5*  HCT 39.1 36.2 29.7* 31.5*  MCV 87.3 89.6 85.3 85.8  PLT 151 114* 111* 126*   Basic Metabolic Panel: Recent Labs  Lab 06/17/23 2227 06/18/23 0935 06/18/23 2357 06/19/23 0602 06/20/23 0515  NA 131* 133* 132* 133* 139  K 3.5 3.0* 3.3* 3.5 3.2*  CL 99 103 102 101 105  CO2 23 22 22 23 25   GLUCOSE 200* 212* 122* 131* 161*  BUN 11 8 7* 6* 7*  CREATININE 0.70 0.53 0.48 0.67 0.53  CALCIUM  8.3* 7.6* 7.6* 7.6* 7.9*  MG  --  1.6* 1.9 1.8 2.1  PHOS  --  2.2* 2.8 3.0  2.8   GFR: Estimated Creatinine Clearance: 66.6 mL/min (by C-G formula based on SCr of 0.53 mg/dL). Liver Function Tests: Recent Labs  Lab 06/17/23 2227  AST 14*  ALT 12  ALKPHOS 56  BILITOT 0.5  PROT 5.9*  ALBUMIN 3.0*   No results for input(s): LIPASE, AMYLASE in the last 168 hours. No results for input(s): AMMONIA in the last 168 hours. Coagulation Profile: Recent Labs  Lab 06/17/23 2212 06/18/23 0935  INR 1.0 1.2   Cardiac Enzymes: No results for input(s): CKTOTAL, CKMB, CKMBINDEX, TROPONINI in the last 168 hours. BNP (last 3 results) No results for input(s): PROBNP in the last 8760 hours. HbA1C: No results for input(s): HGBA1C in the last 72 hours. CBG: Recent Labs  Lab  06/20/23 0818 06/20/23 1143 06/20/23 1638 06/20/23 2137 06/21/23 0750  GLUCAP 169* 258* 111* 221* 155*   Lipid Profile: No results for input(s): CHOL, HDL, LDLCALC, TRIG, CHOLHDL, LDLDIRECT in the last 72 hours. Thyroid  Function Tests: Recent Labs    06/18/23 0935  TSH 2.506  FREET4 0.85   Anemia Panel: No results for input(s): VITAMINB12, FOLATE, FERRITIN, TIBC, IRON, RETICCTPCT in the last 72 hours. Sepsis Labs: Recent Labs  Lab 06/17/23 2212 06/17/23 2346  LATICACIDVEN 1.5 1.0    Recent Results (from the past 240 hours)  Culture, blood (Routine x 2)     Status: Abnormal   Collection Time: 06/17/23 10:12 PM   Specimen: Right Antecubital; Blood  Result Value Ref Range Status   Specimen Description   Final    RIGHT ANTECUBITAL Performed at [facility], [address], [place], [place] [address]    Special Requests   Final    BOTTLES DRAWN AEROBIC AND ANAEROBIC Blood Culture adequate volume Performed at [facility], [address] [place], [place] [address]    Culture  Setup Time   Final    GRAM POSITIVE COCCI IN BOTH AEROBIC AND ANAEROBIC BOTTLES CRITICAL VALUE NOTED.  VALUE IS CONSISTENT WITH PREVIOUSLY REPORTED AND CALLED VALUE. Performed at [facility], [address] [place], [place] [address]    Culture (A)  Final    STAPHYLOCOCCUS AUREUS SUSCEPTIBILITIES PERFORMED ON PREVIOUS CULTURE WITHIN THE LAST 5 DAYS. Performed at [facility], [address], [place], [place] [address]    Report Status 06/20/2023 FINAL  Final  Resp panel by RT-PCR (RSV, Flu A&B, Covid) Anterior Nasal Swab     Status: None   Collection Time: 06/17/23 10:12 PM   Specimen: Anterior Nasal Swab  Result Value Ref Range Status   SARS Coronavirus 2 by RT PCR NEGATIVE NEGATIVE Final    Comment: (NOTE) SARS-CoV-2 target nucleic acids are NOT DETECTED.  The SARS-CoV-2 RNA is generally detectable in upper  respiratory specimens during the acute phase of infection. The lowest concentration of SARS-CoV-2 viral copies this assay can detect is 138 copies/mL. A negative result does not preclude SARS-Cov-2 infection and should not be used as the sole basis for treatment or other patient management decisions. A negative result may occur with  improper specimen collection/handling, submission of specimen other than nasopharyngeal swab, presence of viral mutation(s) within the areas targeted by this assay, and inadequate number of viral copies(<138 copies/mL). A negative result must be combined with clinical observations, patient history, and epidemiological information. The expected result is Negative.  Fact Sheet for Patients:  BloggerCourse.com  Fact Sheet for Healthcare Providers:  SeriousBroker.it  This test is no t yet approved or cleared by the  [place]  FDA and  has been authorized for detection and/or diagnosis of SARS-CoV-2 by FDA under an Emergency Use Authorization (EUA). This EUA will remain  in effect (meaning this test can be used) for the duration of the COVID-19 declaration under Section 564(b)(1) of the Act, 21 U.S.C.section 360bbb-3(b)(1), unless the authorization is terminated  or revoked sooner.       Influenza A by PCR NEGATIVE NEGATIVE Final   Influenza B by PCR NEGATIVE NEGATIVE Final    Comment: (NOTE) The Xpert Xpress SARS-CoV-2/FLU/RSV plus assay is intended as an aid in the diagnosis of influenza from Nasopharyngeal swab specimens and should not be used as a sole basis for treatment. Nasal washings and aspirates are unacceptable for Xpert Xpress SARS-CoV-2/FLU/RSV testing.  Fact Sheet for Patients: BloggerCourse.com  Fact Sheet for Healthcare Providers: SeriousBroker.it  This test is not yet approved or cleared by the [place]  FDA and has been  authorized for detection and/or diagnosis of SARS-CoV-2 by FDA under an Emergency Use Authorization (EUA). This EUA will remain in effect (meaning this test can be used) for the duration of the COVID-19 declaration under Section 564(b)(1) of the Act, 21 U.S.C. section 360bbb-3(b)(1), unless the authorization is terminated or revoked.     Resp Syncytial Virus by PCR NEGATIVE NEGATIVE Final    Comment: (NOTE) Fact Sheet for Patients: BloggerCourse.com  Fact Sheet for Healthcare Providers: SeriousBroker.it  This test is not yet approved or cleared by the [place]  FDA and has been authorized for detection and/or diagnosis of SARS-CoV-2 by FDA under an Emergency Use Authorization (EUA). This EUA will remain in effect (meaning this test can be used) for the duration of the COVID-19 declaration under Section 564(b)(1) of the Act, 21 U.S.C. section 360bbb-3(b)(1), unless the authorization is terminated or revoked.  Performed at [facility], [address] [place], [place] [address]   Culture, blood (Routine x 2)     Status: Abnormal   Collection Time: 06/17/23 10:17 PM   Specimen: BLOOD RIGHT HAND  Result Value Ref Range Status   Specimen Description   Final    BLOOD RIGHT HAND Performed at [facility], [address] [place], [place] [address]    Special Requests   Final    BOTTLES DRAWN AEROBIC AND ANAEROBIC Blood Culture results may not be optimal due to an inadequate volume of blood received in culture bottles Performed at [facility], [address], [place], [place] [address]    Culture  Setup Time   Final    GRAM POSITIVE COCCI IN BOTH AEROBIC AND ANAEROBIC BOTTLES CRITICAL RESULT CALLED TO, READ BACK BY AND VERIFIED WITH: EMILY STEINBOCK 06/18/23 1414 MW Performed at [facility], [address] [place], [place] [address]    Culture STAPHYLOCOCCUS AUREUS (A)  Final   Report  Status 06/20/2023 FINAL  Final   Organism ID, Bacteria STAPHYLOCOCCUS AUREUS  Final      Susceptibility   Staphylococcus aureus - MIC*    CIPROFLOXACIN  <=0.5 SENSITIVE Sensitive     ERYTHROMYCIN >=8 RESISTANT Resistant     GENTAMICIN <=0.5 SENSITIVE Sensitive     OXACILLIN 0.5 SENSITIVE Sensitive     TETRACYCLINE <=1 SENSITIVE Sensitive     VANCOMYCIN  1 SENSITIVE Sensitive     TRIMETH/SULFA <=10 SENSITIVE Sensitive     CLINDAMYCIN <=0.25 SENSITIVE Sensitive     RIFAMPIN <=0.5 SENSITIVE Sensitive     Inducible Clindamycin NEGATIVE Sensitive     LINEZOLID 2 SENSITIVE Sensitive     *  STAPHYLOCOCCUS AUREUS  Blood Culture ID Panel (Reflexed)     Status: Abnormal   Collection Time: 06/17/23 10:17 PM  Result Value Ref Range Status   Enterococcus faecalis NOT DETECTED NOT DETECTED Final   Enterococcus Faecium NOT DETECTED NOT DETECTED Final   Listeria monocytogenes NOT DETECTED NOT DETECTED Final   Staphylococcus species DETECTED (A) NOT DETECTED Final    Comment: CRITICAL RESULT CALLED TO, READ BACK BY AND VERIFIED WITH: EMILY STEINBOCK 06/18/23 1414 MW    Staphylococcus aureus (BCID) DETECTED (A) NOT DETECTED Final    Comment: CRITICAL RESULT CALLED TO, READ BACK BY AND VERIFIED WITH: EMILY STEINBOCK 06/18/23 1414 MW    Staphylococcus epidermidis NOT DETECTED NOT DETECTED Final   Staphylococcus lugdunensis NOT DETECTED NOT DETECTED Final   Streptococcus species NOT DETECTED NOT DETECTED Final   Streptococcus agalactiae NOT DETECTED NOT DETECTED Final   Streptococcus pneumoniae NOT DETECTED NOT DETECTED Final   Streptococcus pyogenes NOT DETECTED NOT DETECTED Final   A.calcoaceticus-baumannii NOT DETECTED NOT DETECTED Final   Bacteroides fragilis NOT DETECTED NOT DETECTED Final   Enterobacterales NOT DETECTED NOT DETECTED Final   Enterobacter cloacae complex NOT DETECTED NOT DETECTED Final   Escherichia coli NOT DETECTED NOT DETECTED Final   Klebsiella aerogenes NOT DETECTED NOT  DETECTED Final   Klebsiella oxytoca NOT DETECTED NOT DETECTED Final   Klebsiella pneumoniae NOT DETECTED NOT DETECTED Final   Proteus species NOT DETECTED NOT DETECTED Final   Salmonella species NOT DETECTED NOT DETECTED Final   Serratia marcescens NOT DETECTED NOT DETECTED Final   Haemophilus influenzae NOT DETECTED NOT DETECTED Final   Neisseria meningitidis NOT DETECTED NOT DETECTED Final   Pseudomonas aeruginosa NOT DETECTED NOT DETECTED Final   Stenotrophomonas maltophilia NOT DETECTED NOT DETECTED Final   Candida albicans NOT DETECTED NOT DETECTED Final   Candida auris NOT DETECTED NOT DETECTED Final   Candida glabrata NOT DETECTED NOT DETECTED Final   Candida krusei NOT DETECTED NOT DETECTED Final   Candida parapsilosis NOT DETECTED NOT DETECTED Final   Candida tropicalis NOT DETECTED NOT DETECTED Final   Cryptococcus neoformans/gattii NOT DETECTED NOT DETECTED Final   Meth resistant mecA/C and MREJ NOT DETECTED NOT DETECTED Final    Comment: Performed at [facility], [address] [place], [place] [address]  Urine Culture     Status: None   Collection Time: 06/17/23 11:46 PM   Specimen: Urine, Random  Result Value Ref Range Status   Specimen Description   Final    URINE, RANDOM Performed at [facility], [address], [place], [place] [address]    Special Requests   Final    NONE Performed at [facility], [address] [place], [place] [address]    Culture   Final    NO GROWTH Performed at [facility], [address] [place], [place] [address]    Report Status 06/19/2023 FINAL  Final  Culture, blood (Routine X 2) w Reflex to ID Panel     Status: None (Preliminary result)   Collection Time: 06/18/23 11:57 PM   Specimen: BLOOD  Result Value Ref Range Status   Specimen Description BLOOD BLOOD LEFT ARM  Final   Special Requests   Final    BOTTLES DRAWN AEROBIC AND ANAEROBIC Blood Culture adequate volume   Culture   Final     NO GROWTH 2 DAYS Performed at [facility], [address], [place], [place] [address]    Report Status PENDING  Incomplete  Culture, blood (Routine X 2) w Reflex to ID Panel     Status: None (Preliminary result)   Collection Time: 06/19/23 12:18 AM   Specimen: BLOOD  Result Value Ref Range Status   Specimen Description BLOOD BLOOD LEFT ARM  Final   Special Requests   Final    BOTTLES DRAWN AEROBIC AND ANAEROBIC Blood Culture results may not be optimal due to an inadequate volume of blood received in culture bottles   Culture   Final    NO GROWTH 2 DAYS Performed at [facility], [address], [place], [place] [address]    Report Status PENDING  Incomplete  C Difficile Quick Screen w PCR reflex     Status: None   Collection Time: 06/19/23  9:40 AM   Specimen: Catheter Tip; Stool  Result Value Ref Range Status   C Diff antigen NEGATIVE NEGATIVE Final   C Diff toxin NEGATIVE NEGATIVE Final   C Diff interpretation No C. difficile detected.  Final    Comment: Performed at [facility], [address] [place], [place] [address]  Gastrointestinal Panel by PCR , Stool     Status: Abnormal   Collection Time: 06/19/23  9:40 AM   Specimen: Catheter Tip; Stool  Result Value Ref Range Status   Campylobacter species NOT DETECTED NOT DETECTED Final   Plesimonas shigelloides NOT DETECTED NOT DETECTED Final   Salmonella species NOT DETECTED NOT DETECTED Final   Yersinia enterocolitica NOT DETECTED NOT DETECTED Final   Vibrio species NOT DETECTED NOT DETECTED Final   Vibrio cholerae NOT DETECTED NOT DETECTED Final   Enteroaggregative E coli (EAEC) NOT DETECTED NOT DETECTED Final   Enteropathogenic E coli (EPEC) NOT DETECTED NOT DETECTED Final   Enterotoxigenic E coli (ETEC) NOT DETECTED NOT DETECTED Final   Shiga like toxin producing E coli (STEC) NOT DETECTED NOT DETECTED Final   Shigella/Enteroinvasive E coli (EIEC) NOT DETECTED NOT DETECTED Final    Cryptosporidium NOT DETECTED NOT DETECTED Final   Cyclospora cayetanensis NOT DETECTED NOT DETECTED Final   Entamoeba histolytica NOT DETECTED NOT DETECTED Final   Giardia lamblia NOT DETECTED NOT DETECTED Final   Adenovirus F40/41 NOT DETECTED NOT DETECTED Final   Astrovirus NOT DETECTED NOT DETECTED Final   Norovirus GI/GII DETECTED (A) NOT DETECTED Final   Rotavirus A NOT DETECTED NOT DETECTED Final    Comment: RESULT CALLED TO, READ BACK BY AND VERIFIED WITH: Deyanne Forester, RN 1202 06/19/23 GM    Sapovirus (I, II, IV, and V) NOT DETECTED NOT DETECTED Final    Comment: Performed at [facility], [address], [place], [place] [address]  Cath Tip Culture     Status: None (Preliminary result)   Collection Time: 06/19/23 10:09 AM   Specimen: Catheter Tip  Result Value Ref Range Status   Specimen Description CATH TIP  Final   Special Requests NONE  Final   Culture   Final    NO GROWTH < 24 HOURS Performed at [facility], [address] [place], [place] [address]    Report Status PENDING  Incomplete  Culture, blood (Routine X 2) w Reflex to ID Panel     Status: None (Preliminary result)   Collection Time: 06/20/23  5:14 AM   Specimen: BLOOD  Result Value Ref Range Status   Specimen Description BLOOD BLOOD RIGHT HAND  Final   Special Requests   Final    BOTTLES DRAWN AEROBIC AND ANAEROBIC Blood Culture adequate  volume   Culture   Final    NO GROWTH 1 DAY Performed at [facility], [address] [place], [place] [address]    Report Status PENDING  Incomplete  Culture, blood (Routine X 2) w Reflex to ID Panel     Status: None (Preliminary result)   Collection Time: 06/20/23  5:14 AM   Specimen: BLOOD  Result Value Ref Range Status   Specimen Description BLOOD BLOOD LEFT ARM  Final   Special Requests   Final    BOTTLES DRAWN AEROBIC AND ANAEROBIC Blood Culture adequate volume   Culture   Final    NO GROWTH 1 DAY Performed at [facility],  [address], [place], [place] [address]    Report Status PENDING  Incomplete         Radiology Studies: ECHOCARDIOGRAM COMPLETE BUBBLE STUDY Result Date: 06/19/2023    ECHOCARDIOGRAM REPORT   Patient Name:   MALKA BOCEK Date of Exam: 06/19/2023 Medical Rec #:  [id number]        Height:       64.0 in Accession #:    [id number]       Weight:       165.0 lb Date of Birth:  May 15, 1955         BSA:          1.803 m Patient Age:    67 years         BP:           119/78 mmHg Patient Gender: F                HR:           121 bpm. Exam Location:  [facility] Procedure: 2D Echo, Cardiac Doppler, Color Doppler and Saline Contrast Bubble            Study (Both Spectral and Color Flow Doppler were utilized during            procedure). Indications:     Endocarditis  History:         Patient has no prior history of Echocardiogram examinations.                  Risk Factors:Diabetes.  Sonographer:     Broadus Canes Referring Phys:  [id number] [facility] Diagnosing Phys: Sammy Crisp MD IMPRESSIONS  1. Left ventricular ejection fraction, by estimation, is 60 to 65%. The left ventricle has normal function. The left ventricle has no regional wall motion abnormalities. There is mild left ventricular hypertrophy. Indeterminate diastolic filling due to E-A fusion.  2. Right ventricular systolic function is normal. The right ventricular size is normal. Tricuspid regurgitation signal is inadequate for assessing PA pressure.  3. The mitral valve is normal in structure. No evidence of mitral valve regurgitation. No evidence of mitral stenosis.  4. The aortic valve has an indeterminant number of cusps. Aortic valve regurgitation is not visualized. No aortic stenosis is present.  5. Agitated saline contrast bubble study was negative, with no evidence of any interatrial shunt. FINDINGS  Left Ventricle: Left ventricular ejection fraction, by estimation, is 60 to 65%. The left ventricle has normal function. The left ventricle has no  regional wall motion abnormalities. The left ventricular internal cavity size was normal in size. There is  mild left ventricular hypertrophy. Indeterminate diastolic filling due to E-A fusion. Right Ventricle: The right ventricular size is normal. No increase in right ventricular wall thickness. Right ventricular systolic function  is normal. Tricuspid regurgitation signal is inadequate for assessing PA pressure. Left Atrium: Left atrial size was normal in size. Right Atrium: Right atrial size was normal in size. Pericardium: The pericardium was not well visualized. Mitral Valve: The mitral valve is normal in structure. No evidence of mitral valve regurgitation. No evidence of mitral valve stenosis. Tricuspid Valve: The tricuspid valve is not well visualized. Tricuspid valve regurgitation is trivial. Aortic Valve: The aortic valve has an indeterminant number of cusps. Aortic valve regurgitation is not visualized. No aortic stenosis is present. Aortic valve mean gradient measures 2.0 mmHg. Aortic valve peak gradient measures 2.9 mmHg. Aortic valve area, by VTI measures 4.14 cm. Pulmonic Valve: The pulmonic valve was not well visualized. Pulmonic valve regurgitation is not visualized. No evidence of pulmonic stenosis. Aorta: The aortic root is normal in size and structure. Pulmonary Artery: The pulmonary artery is not well seen. Venous: The inferior vena cava was not well visualized. IAS/Shunts: The interatrial septum was not well visualized. Agitated saline contrast was given intravenously to evaluate for intracardiac shunting. Agitated saline contrast bubble study was negative, with no evidence of any interatrial shunt.  LEFT VENTRICLE PLAX 2D LVIDd:         3.40 cm   Diastology LVIDs:         2.30 cm   LV e' medial:   5.98 cm/s LV PW:         1.00 cm   LV E/e' medial: 11.5 LV IVS:        1.20 cm LVOT diam:     2.00 cm LV SV:         47 LV SV Index:   26 LVOT Area:     3.14 cm  RIGHT VENTRICLE RV Basal diam:  3.20  cm RV Mid diam:    3.10 cm LEFT ATRIUM           Index        RIGHT ATRIUM           Index LA diam:      2.30 cm 1.28 cm/m   RA Area:     17.40 cm LA Vol (A2C): 20.0 ml 11.09 ml/m  RA Volume:   42.80 ml  23.74 ml/m LA Vol (A4C): 22.4 ml 12.43 ml/m  AORTIC VALVE AV Area (Vmax):    2.87 cm AV Area (Vmean):   3.05 cm AV Area (VTI):     4.14 cm AV Vmax:           85.65 cm/s AV Vmean:          60.700 cm/s AV VTI:            0.113 m AV Peak Grad:      2.9 mmHg AV Mean Grad:      2.0 mmHg LVOT Vmax:         78.20 cm/s LVOT Vmean:        59.000 cm/s LVOT VTI:          0.149 m LVOT/AV VTI ratio: 1.32  AORTA Ao Root diam: 3.50 cm MITRAL VALVE MV Area (PHT): 6.77 cm     SHUNTS MV Decel Time: 112 msec     Systemic VTI:  0.15 m MV E velocity: 68.70 cm/s   Systemic Diam: 2.00 cm MV A velocity: 124.00 cm/s MV E/A ratio:  0.55 Veryl Gottron End MD Electronically signed by Sammy Crisp MD Signature Date/Time: 06/19/2023/4:47:04 PM    Final    IR REMOVAL TUN ACCESS  W/ PORT W/O FL MOD SED Result Date: 06/19/2023 INDICATION: MSSA bacteremia.  Port removal with tip culturing. EXAM: REMOVAL RIGHT IJ VEIN PORT-A-CATH MEDICATIONS: ; The antibiotic was administered within an appropriate time interval prior to skin puncture. ANESTHESIA/SEDATION: Moderate (conscious) sedation was employed during this procedure. A total of Versed  0 mg and Fentanyl  0 mcg was administered intravenously by the radiology nurse. Total intra-service moderate Sedation Time: 0 minutes. The patient's level of consciousness and vital signs were monitored continuously by radiology nursing throughout the procedure under my direct supervision. FLUOROSCOPY: Radiation Exposure Index (as provided by the fluoroscopic device): 0 mGy Kerma COMPLICATIONS: None immediate. PROCEDURE: Informed written consent was obtained from the patient after a thorough discussion of the procedural risks, benefits and alternatives. All questions were addressed. Maximal Sterile Barrier  Technique was utilized including caps, mask, sterile gowns, sterile gloves, sterile drape, hand hygiene and skin antiseptic. A timeout was performed prior to the initiation of the procedure. The right chest was prepped and draped in a sterile fashion. Lidocaine  was utilized for local anesthesia. An incision was made over the previously healed surgical incision. Utilizing blunt dissection, the port catheter and reservoir were removed from the underlying subcutaneous tissue in their entirety. Securing sutures were also removed. The pocket was irrigated with a copious amount of sterile normal saline. The pocket was closed with interrupted 3-0 Vicryl stitches. The subcutaneous tissue was closed with 3-0 Vicryl interrupted subcutaneous stitches. A 4-0 Vicryl running subcuticular stitch was utilized to approximate the skin. Dermabond was applied. IMPRESSION: Successful right IJ vein Port-A-Cath explant. The tip of the catheter was cut and sent to laboratory for culture and sensitivity. Electronically Signed   By: Susan Ensign   On: 06/19/2023 10:11        Scheduled Meds:  cetirizine   10 mg Oral QPM   Chlorhexidine  Gluconate Cloth  6 each Topical Daily   enoxaparin  (LOVENOX ) injection  40 mg Subcutaneous Q24H   feeding supplement  237 mL Oral BID BM   insulin  aspart  0-15 Units Subcutaneous TID WC   lidocaine -EPINEPHrine -tetracaine   3 mL Topical Once   pantoprazole   40 mg Oral Daily   rosuvastatin   20 mg Oral Daily   saccharomyces boulardii  250 mg Oral BID   sodium chloride  flush  10-40 mL Intracatheter Q12H   Continuous Infusions:   ceFAZolin  (ANCEF ) IV 2 g (06/21/23 0410)     LOS: 3 days     Alphonsus Jeans, MD [facility] Pager [phone]  If 7PM-7AM, please contact night-coverage [email or web address] 06/21/2023, 7:59 AM

## 2023-06-21 NOTE — Plan of Care (Signed)
  Problem: Fluid Volume: Goal: Hemodynamic stability will improve Outcome: Progressing   Problem: Clinical Measurements: Goal: Diagnostic test results will improve Outcome: Progressing Goal: Signs and symptoms of infection will decrease Outcome: Progressing   Problem: Respiratory: Goal: Ability to maintain adequate ventilation will improve Outcome: Progressing   Problem: Education: Goal: Ability to describe self-care measures that may prevent or decrease complications (Diabetes Survival Skills Education) will improve Outcome: Progressing Goal: Individualized Educational Video(s) Outcome: Progressing   Problem: Coping: Goal: Ability to adjust to condition or change in health will improve Outcome: Progressing   Problem: Fluid Volume: Goal: Ability to maintain a balanced intake and output will improve Outcome: Progressing   Problem: Health Behavior/Discharge Planning: Goal: Ability to identify and utilize available resources and services will improve Outcome: Progressing Goal: Ability to manage health-related needs will improve Outcome: Progressing   Problem: Metabolic: Goal: Ability to maintain appropriate glucose levels will improve Outcome: Progressing   Problem: Nutritional: Goal: Maintenance of adequate nutrition will improve Outcome: Progressing Goal: Progress toward achieving an optimal weight will improve Outcome: Progressing   Problem: Skin Integrity: Goal: Risk for impaired skin integrity will decrease Outcome: Progressing   Problem: Tissue Perfusion: Goal: Adequacy of tissue perfusion will improve Outcome: Progressing   Problem: Education: Goal: Knowledge of General Education information will improve Description: Including pain rating scale, medication(s)/side effects and non-pharmacologic comfort measures Outcome: Progressing   Problem: Health Behavior/Discharge Planning: Goal: Ability to manage health-related needs will improve Outcome:  Progressing   Problem: Clinical Measurements: Goal: Ability to maintain clinical measurements within normal limits will improve Outcome: Progressing Goal: Will remain free from infection Outcome: Progressing Goal: Diagnostic test results will improve Outcome: Progressing Goal: Respiratory complications will improve Outcome: Progressing Goal: Cardiovascular complication will be avoided Outcome: Progressing   Problem: Activity: Goal: Risk for activity intolerance will decrease Outcome: Progressing   Problem: Nutrition: Goal: Adequate nutrition will be maintained Outcome: Progressing   Problem: Coping: Goal: Level of anxiety will decrease Outcome: Progressing   Problem: Elimination: Goal: Will not experience complications related to bowel motility Outcome: Progressing Goal: Will not experience complications related to urinary retention Outcome: Progressing   Problem: Pain Managment: Goal: General experience of comfort will improve and/or be controlled Outcome: Progressing   Problem: Safety: Goal: Ability to remain free from injury will improve Outcome: Progressing   Problem: Skin Integrity: Goal: Risk for impaired skin integrity will decrease Outcome: Progressing   Problem: Education: Goal: Knowledge of General Education information will improve Description: Including pain rating scale, medication(s)/side effects and non-pharmacologic comfort measures Outcome: Progressing

## 2023-06-22 ENCOUNTER — Inpatient Hospital Stay: Admit: 2023-06-22

## 2023-06-22 ENCOUNTER — Inpatient Hospital Stay

## 2023-06-22 DIAGNOSIS — B9561 Methicillin susceptible Staphylococcus aureus infection as the cause of diseases classified elsewhere: Secondary | ICD-10-CM | POA: Diagnosis not present

## 2023-06-22 DIAGNOSIS — R7881 Bacteremia: Secondary | ICD-10-CM | POA: Diagnosis not present

## 2023-06-22 LAB — CBC WITH DIFFERENTIAL/PLATELET
Abs Immature Granulocytes: 0.02 10*3/uL (ref 0.00–0.07)
Basophils Absolute: 0 10*3/uL (ref 0.0–0.1)
Basophils Relative: 1 %
Eosinophils Absolute: 0 10*3/uL (ref 0.0–0.5)
Eosinophils Relative: 1 %
HCT: 30 % — ABNORMAL LOW (ref 36.0–46.0)
Hemoglobin: 9.6 g/dL — ABNORMAL LOW (ref 12.0–15.0)
Immature Granulocytes: 1 %
Lymphocytes Relative: 48 %
Lymphs Abs: 1 10*3/uL (ref 0.7–4.0)
MCH: 27.4 pg (ref 26.0–34.0)
MCHC: 32 g/dL (ref 30.0–36.0)
MCV: 85.5 fL (ref 80.0–100.0)
Monocytes Absolute: 0.3 10*3/uL (ref 0.1–1.0)
Monocytes Relative: 12 %
Neutro Abs: 0.8 10*3/uL — ABNORMAL LOW (ref 1.7–7.7)
Neutrophils Relative %: 37 %
Platelets: 175 10*3/uL (ref 150–400)
RBC: 3.51 MIL/uL — ABNORMAL LOW (ref 3.87–5.11)
RDW: 14.4 % (ref 11.5–15.5)
Smear Review: NORMAL
WBC: 2.2 10*3/uL — ABNORMAL LOW (ref 4.0–10.5)
nRBC: 0 % (ref 0.0–0.2)

## 2023-06-22 LAB — GLUCOSE, CAPILLARY
Glucose-Capillary: 139 mg/dL — ABNORMAL HIGH (ref 70–99)
Glucose-Capillary: 146 mg/dL — ABNORMAL HIGH (ref 70–99)
Glucose-Capillary: 164 mg/dL — ABNORMAL HIGH (ref 70–99)
Glucose-Capillary: 151 mg/dL — ABNORMAL HIGH (ref 70–99)

## 2023-06-22 LAB — BASIC METABOLIC PANEL WITH GFR
Anion gap: 7 (ref 5–15)
BUN: 9 mg/dL (ref 8–23)
CO2: 24 mmol/L (ref 22–32)
Calcium: 8.4 mg/dL — ABNORMAL LOW (ref 8.9–10.3)
Chloride: 108 mmol/L (ref 98–111)
Creatinine, Ser: 0.47 mg/dL (ref 0.44–1.00)
GFR, Estimated: >60 mL/min (ref >60)
Glucose, Bld: 197 mg/dL — ABNORMAL HIGH (ref 70–99)
Potassium: 3.8 mmol/L (ref 3.5–5.1)
Sodium: 139 mmol/L (ref 135–145)

## 2023-06-22 MED ORDER — MAGIC MOUTHWASH
10.0000 mL | Freq: Three times a day (TID) | ORAL | Status: DC | PRN
  Administered 2023-06-22 – 2023-06-23 (×2): 10 mL via ORAL
  Filled 2023-06-22 (×3): qty 10

## 2023-06-22 NOTE — Progress Notes (Signed)
 PROGRESS NOTE    Kathleen Russell  YQI:[id number] DOB: January 15, 1955 DOA: 06/17/2023 PCP: Thersia Flax, MD    Assessment & Plan:   Principal Problem:   SIRS (systemic inflammatory response syndrome) (HCC) Active Problems:   Dyslipidemia   Type 2 diabetes mellitus without complications (HCC)   Pancreatic cancer (HCC)  Assessment and Plan:  MSSA bacteremia: etiology unclear, possibly secondary to chemo port. S/p chemo port removed 06/19/23. Continue on IV cefazolin  as per ID. Repeat blood cxs NGTD. Echo was neg for vegetations. TEE will be on 06/24/23 as per cardio. Cardio recs apprec    Sinus tachycardia: s/p cardizem  IV x 1. Continue on tele    Diarrhea: norovirus was found on GI PCR panel. Continue w/ supportive care    Hypokalemia: WNL today   Hypophosphatemia: resolved  Hypomagnesemia: resolved   Pancreatic adenocarcinoma: stage II. On chemo. Will f/u outpatient w/ onco, Dr. Jinx Mourning: likely secondary to recent chemo. Platelets are WNL. No need for a transfusion currently    DM2: fair control, HbA1c 7.4. Continue on SSI w/ accuchecks    HLD: continue on statin     DVT prophylaxis: lovenox   Code Status: full  Family Communication:  Disposition Plan: likely d/c back home   Level of care: Telemetry Medical  Status is: Inpatient Remains inpatient appropriate because: severity of illness, waiting on TEE     Consultants:  ID Cardio   Procedures:   Antimicrobials: cefazolin     Subjective: Pt c/o diarrhea.   Objective: Vitals:   06/21/23 2128 06/22/23 0031 06/22/23 0431 06/22/23 0737  BP: 117/73 (!) 99/54 108/69 111/72  Pulse: 93 92 91 90  Resp: 18 16 18 17   Temp: 97.9 F (36.6 C) 98.2 F (36.8 C) 97.9 F (36.6 C) 97.6 F (36.4 C)  TempSrc: Oral Oral    SpO2: 98% 100% 98% 98%  Weight:      Height:        Intake/Output Summary (Last 24 hours) at 06/22/2023 0820 Last data filed at 06/21/2023 1900 Gross per 24 hour  Intake 120 ml   Output --  Net 120 ml   Filed Weights   06/17/23 2108  Weight: 74.8 kg    Examination:  General exam: appears calm & comfortable  Respiratory system: clear breath sounds b/l  Cardiovascular system: S1 & S2+. No rubs or clicks  Gastrointestinal system: abd is soft, NT, ND & hypoactive bowel sounds Central nervous system: alert & oriented. Moves all extremities  Psychiatry: judgement and insight appears normal. Appropriate mood and affect     Data Reviewed: I have personally reviewed following labs and imaging studies  CBC: Recent Labs  Lab 06/17/23 2227 06/18/23 0259 06/19/23 0602 06/20/23 0515 06/21/23 0634 06/22/23 0219  WBC 3.1* 2.9* 1.5* 1.7* 2.1* 2.2*  NEUTROABS 2.0  --   --  0.5* 0.5* 0.8*  HGB 12.6 11.7* 10.2* 10.5* 10.4* 9.6*  HCT 39.1 36.2 29.7* 31.5* 30.4* 30.0*  MCV 87.3 89.6 85.3 85.8 84.4 85.5  PLT 151 114* 111* 126* 152 175   Basic Metabolic Panel: Recent Labs  Lab 06/18/23 0935 06/18/23 2357 06/19/23 0602 06/20/23 0515 06/21/23 0634 06/22/23 0219  NA 133* 132* 133* 139 137 139  K 3.0* 3.3* 3.5 3.2* 3.7 3.8  CL 103 102 101 105 104 108  CO2 22 22 23 25 26 24   GLUCOSE 212* 122* 131* 161* 168* 197*  BUN 8 7* 6* 7* 10 9  CREATININE 0.53 0.48 0.67 0.53 0.54  0.47  CALCIUM  7.6* 7.6* 7.6* 7.9* 8.2* 8.4*  MG 1.6* 1.9 1.8 2.1 2.1  --   PHOS 2.2* 2.8 3.0 2.8 2.7  --    GFR: Estimated Creatinine Clearance: 66.6 mL/min (by C-G formula based on SCr of 0.47 mg/dL). Liver Function Tests: Recent Labs  Lab 06/17/23 2227  AST 14*  ALT 12  ALKPHOS 56  BILITOT 0.5  PROT 5.9*  ALBUMIN 3.0*   No results for input(s): LIPASE, AMYLASE in the last 168 hours. No results for input(s): AMMONIA in the last 168 hours. Coagulation Profile: Recent Labs  Lab 06/17/23 2212 06/18/23 0935  INR 1.0 1.2   Cardiac Enzymes: No results for input(s): CKTOTAL, CKMB, CKMBINDEX, TROPONINI in the last 168 hours. BNP (last 3 results) No results for  input(s): PROBNP in the last 8760 hours. HbA1C: No results for input(s): HGBA1C in the last 72 hours. CBG: Recent Labs  Lab 06/21/23 0750 06/21/23 1135 06/21/23 1704 06/21/23 2133 06/22/23 0739  GLUCAP 155* 167* 221* 126* 139*   Lipid Profile: No results for input(s): CHOL, HDL, LDLCALC, TRIG, CHOLHDL, LDLDIRECT in the last 72 hours. Thyroid  Function Tests: No results for input(s): TSH, T4TOTAL, FREET4, T3FREE, THYROIDAB in the last 72 hours.  Anemia Panel: No results for input(s): VITAMINB12, FOLATE, FERRITIN, TIBC, IRON, RETICCTPCT in the last 72 hours. Sepsis Labs: Recent Labs  Lab 06/17/23 2212 06/17/23 2346  LATICACIDVEN 1.5 1.0    Recent Results (from the past 240 hours)  Culture, blood (Routine x 2)     Status: Abnormal   Collection Time: 06/17/23 10:12 PM   Specimen: Right Antecubital; Blood  Result Value Ref Range Status   Specimen Description   Final    RIGHT ANTECUBITAL Performed at [facility], [address], [place], [place] [address]    Special Requests   Final    BOTTLES DRAWN AEROBIC AND ANAEROBIC Blood Culture adequate volume Performed at [facility], [address], [place], [place] [address]    Culture  Setup Time   Final    GRAM POSITIVE COCCI IN BOTH AEROBIC AND ANAEROBIC BOTTLES CRITICAL VALUE NOTED.  VALUE IS CONSISTENT WITH PREVIOUSLY REPORTED AND CALLED VALUE. Performed at [facility], [address] [place], [place] [address]    Culture (A)  Final    STAPHYLOCOCCUS AUREUS SUSCEPTIBILITIES PERFORMED ON PREVIOUS CULTURE WITHIN THE LAST 5 DAYS. Performed at [facility], [address] [place], [place] [address]    Report Status 06/20/2023 FINAL  Final  Resp panel by RT-PCR (RSV, Flu A&B, Covid) Anterior Nasal Swab     Status: None   Collection Time: 06/17/23 10:12 PM   Specimen: Anterior Nasal Swab  Result Value Ref Range Status   SARS Coronavirus 2 by RT  PCR NEGATIVE NEGATIVE Final    Comment: (NOTE) SARS-CoV-2 target nucleic acids are NOT DETECTED.  The SARS-CoV-2 RNA is generally detectable in upper respiratory specimens during the acute phase of infection. The lowest concentration of SARS-CoV-2 viral copies this assay can detect is 138 copies/mL. A negative result does not preclude SARS-Cov-2 infection and should not be used as the sole basis for treatment or other patient management decisions. A negative result may occur with  improper specimen collection/handling, submission of specimen other than nasopharyngeal swab, presence of viral mutation(s) within the areas targeted by this assay, and inadequate number of viral copies(<138 copies/mL). A negative result must be combined with clinical observations, patient history, and epidemiological information. The expected result is  Negative.  Fact Sheet for Patients:  BloggerCourse.com  Fact Sheet for Healthcare Providers:  SeriousBroker.it  This test is no t yet approved or cleared by the [place]  FDA and  has been authorized for detection and/or diagnosis of SARS-CoV-2 by FDA under an Emergency Use Authorization (EUA). This EUA will remain  in effect (meaning this test can be used) for the duration of the COVID-19 declaration under Section 564(b)(1) of the Act, 21 U.S.C.section 360bbb-3(b)(1), unless the authorization is terminated  or revoked sooner.       Influenza A by PCR NEGATIVE NEGATIVE Final   Influenza B by PCR NEGATIVE NEGATIVE Final    Comment: (NOTE) The Xpert Xpress SARS-CoV-2/FLU/RSV plus assay is intended as an aid in the diagnosis of influenza from Nasopharyngeal swab specimens and should not be used as a sole basis for treatment. Nasal washings and aspirates are unacceptable for Xpert Xpress SARS-CoV-2/FLU/RSV testing.  Fact Sheet for Patients: BloggerCourse.com  Fact Sheet for  Healthcare Providers: SeriousBroker.it  This test is not yet approved or cleared by the [place]  FDA and has been authorized for detection and/or diagnosis of SARS-CoV-2 by FDA under an Emergency Use Authorization (EUA). This EUA will remain in effect (meaning this test can be used) for the duration of the COVID-19 declaration under Section 564(b)(1) of the Act, 21 U.S.C. section 360bbb-3(b)(1), unless the authorization is terminated or revoked.     Resp Syncytial Virus by PCR NEGATIVE NEGATIVE Final    Comment: (NOTE) Fact Sheet for Patients: BloggerCourse.com  Fact Sheet for Healthcare Providers: SeriousBroker.it  This test is not yet approved or cleared by the [place]  FDA and has been authorized for detection and/or diagnosis of SARS-CoV-2 by FDA under an Emergency Use Authorization (EUA). This EUA will remain in effect (meaning this test can be used) for the duration of the COVID-19 declaration under Section 564(b)(1) of the Act, 21 U.S.C. section 360bbb-3(b)(1), unless the authorization is terminated or revoked.  Performed at [facility], [address] [place], [place] [address]   Culture, blood (Routine x 2)     Status: Abnormal   Collection Time: 06/17/23 10:17 PM   Specimen: BLOOD RIGHT HAND  Result Value Ref Range Status   Specimen Description   Final    BLOOD RIGHT HAND Performed at [facility], [address] [place], [place] [address]    Special Requests   Final    BOTTLES DRAWN AEROBIC AND ANAEROBIC Blood Culture results may not be optimal due to an inadequate volume of blood received in culture bottles Performed at [facility], [address], [place], [place] [address]    Culture  Setup Time   Final    GRAM POSITIVE COCCI IN BOTH AEROBIC AND ANAEROBIC BOTTLES CRITICAL RESULT CALLED TO, READ BACK BY AND VERIFIED WITH: EMILY STEINBOCK  06/18/23 1414 MW Performed at [facility], [address] [place], [place] [address]    Culture STAPHYLOCOCCUS AUREUS (A)  Final   Report Status 06/20/2023 FINAL  Final   Organism ID, Bacteria STAPHYLOCOCCUS AUREUS  Final      Susceptibility   Staphylococcus aureus - MIC*    CIPROFLOXACIN  <=0.5 SENSITIVE Sensitive     ERYTHROMYCIN >=8 RESISTANT Resistant     GENTAMICIN <=0.5 SENSITIVE Sensitive     OXACILLIN 0.5 SENSITIVE Sensitive     TETRACYCLINE <=1 SENSITIVE Sensitive     VANCOMYCIN  1 SENSITIVE Sensitive     TRIMETH/SULFA <=10 SENSITIVE Sensitive  CLINDAMYCIN <=0.25 SENSITIVE Sensitive     RIFAMPIN <=0.5 SENSITIVE Sensitive     Inducible Clindamycin NEGATIVE Sensitive     LINEZOLID 2 SENSITIVE Sensitive     * STAPHYLOCOCCUS AUREUS  Blood Culture ID Panel (Reflexed)     Status: Abnormal   Collection Time: 06/17/23 10:17 PM  Result Value Ref Range Status   Enterococcus faecalis NOT DETECTED NOT DETECTED Final   Enterococcus Faecium NOT DETECTED NOT DETECTED Final   Listeria monocytogenes NOT DETECTED NOT DETECTED Final   Staphylococcus species DETECTED (A) NOT DETECTED Final    Comment: CRITICAL RESULT CALLED TO, READ BACK BY AND VERIFIED WITH: EMILY STEINBOCK 06/18/23 1414 MW    Staphylococcus aureus (BCID) DETECTED (A) NOT DETECTED Final    Comment: CRITICAL RESULT CALLED TO, READ BACK BY AND VERIFIED WITH: EMILY STEINBOCK 06/18/23 1414 MW    Staphylococcus epidermidis NOT DETECTED NOT DETECTED Final   Staphylococcus lugdunensis NOT DETECTED NOT DETECTED Final   Streptococcus species NOT DETECTED NOT DETECTED Final   Streptococcus agalactiae NOT DETECTED NOT DETECTED Final   Streptococcus pneumoniae NOT DETECTED NOT DETECTED Final   Streptococcus pyogenes NOT DETECTED NOT DETECTED Final   A.calcoaceticus-baumannii NOT DETECTED NOT DETECTED Final   Bacteroides fragilis NOT DETECTED NOT DETECTED Final   Enterobacterales NOT DETECTED NOT DETECTED Final    Enterobacter cloacae complex NOT DETECTED NOT DETECTED Final   Escherichia coli NOT DETECTED NOT DETECTED Final   Klebsiella aerogenes NOT DETECTED NOT DETECTED Final   Klebsiella oxytoca NOT DETECTED NOT DETECTED Final   Klebsiella pneumoniae NOT DETECTED NOT DETECTED Final   Proteus species NOT DETECTED NOT DETECTED Final   Salmonella species NOT DETECTED NOT DETECTED Final   Serratia marcescens NOT DETECTED NOT DETECTED Final   Haemophilus influenzae NOT DETECTED NOT DETECTED Final   Neisseria meningitidis NOT DETECTED NOT DETECTED Final   Pseudomonas aeruginosa NOT DETECTED NOT DETECTED Final   Stenotrophomonas maltophilia NOT DETECTED NOT DETECTED Final   Candida albicans NOT DETECTED NOT DETECTED Final   Candida auris NOT DETECTED NOT DETECTED Final   Candida glabrata NOT DETECTED NOT DETECTED Final   Candida krusei NOT DETECTED NOT DETECTED Final   Candida parapsilosis NOT DETECTED NOT DETECTED Final   Candida tropicalis NOT DETECTED NOT DETECTED Final   Cryptococcus neoformans/gattii NOT DETECTED NOT DETECTED Final   Meth resistant mecA/C and MREJ NOT DETECTED NOT DETECTED Final    Comment: Performed at [facility], [address], [place], [place] [address]  Urine Culture     Status: None   Collection Time: 06/17/23 11:46 PM   Specimen: Urine, Random  Result Value Ref Range Status   Specimen Description   Final    URINE, RANDOM Performed at [facility], [address], [place], [place] [address]    Special Requests   Final    NONE Performed at [facility], [address] [place], [place] [address]    Culture   Final    NO GROWTH Performed at [facility], [address] [place], [place] [address]    Report Status 06/19/2023 FINAL  Final  Culture, blood (Routine X 2) w Reflex to ID Panel     Status: None (Preliminary result)   Collection Time: 06/18/23 11:57 PM   Specimen: BLOOD  Result Value Ref Range Status   Specimen  Description BLOOD BLOOD LEFT ARM  Final   Special Requests   Final    BOTTLES DRAWN AEROBIC AND ANAEROBIC Blood Culture adequate  volume   Culture   Final    NO GROWTH 3 DAYS Performed at [facility], [address] [place], [place] [address]    Report Status PENDING  Incomplete  Culture, blood (Routine X 2) w Reflex to ID Panel     Status: None (Preliminary result)   Collection Time: 06/19/23 12:18 AM   Specimen: BLOOD  Result Value Ref Range Status   Specimen Description BLOOD BLOOD LEFT ARM  Final   Special Requests   Final    BOTTLES DRAWN AEROBIC AND ANAEROBIC Blood Culture results may not be optimal due to an inadequate volume of blood received in culture bottles   Culture   Final    NO GROWTH 3 DAYS Performed at [facility], [address], [place], [place] [address]    Report Status PENDING  Incomplete  C Difficile Quick Screen w PCR reflex     Status: None   Collection Time: 06/19/23  9:40 AM   Specimen: Catheter Tip; Stool  Result Value Ref Range Status   C Diff antigen NEGATIVE NEGATIVE Final   C Diff toxin NEGATIVE NEGATIVE Final   C Diff interpretation No C. difficile detected.  Final    Comment: Performed at [facility], [address] [place], [place] [address]  Gastrointestinal Panel by PCR , Stool     Status: Abnormal   Collection Time: 06/19/23  9:40 AM   Specimen: Catheter Tip; Stool  Result Value Ref Range Status   Campylobacter species NOT DETECTED NOT DETECTED Final   Plesimonas shigelloides NOT DETECTED NOT DETECTED Final   Salmonella species NOT DETECTED NOT DETECTED Final   Yersinia enterocolitica NOT DETECTED NOT DETECTED Final   Vibrio species NOT DETECTED NOT DETECTED Final   Vibrio cholerae NOT DETECTED NOT DETECTED Final   Enteroaggregative E coli (EAEC) NOT DETECTED NOT DETECTED Final   Enteropathogenic E coli (EPEC) NOT DETECTED NOT DETECTED Final   Enterotoxigenic E coli (ETEC) NOT DETECTED NOT DETECTED Final    Shiga like toxin producing E coli (STEC) NOT DETECTED NOT DETECTED Final   Shigella/Enteroinvasive E coli (EIEC) NOT DETECTED NOT DETECTED Final   Cryptosporidium NOT DETECTED NOT DETECTED Final   Cyclospora cayetanensis NOT DETECTED NOT DETECTED Final   Entamoeba histolytica NOT DETECTED NOT DETECTED Final   Giardia lamblia NOT DETECTED NOT DETECTED Final   Adenovirus F40/41 NOT DETECTED NOT DETECTED Final   Astrovirus NOT DETECTED NOT DETECTED Final   Norovirus GI/GII DETECTED (A) NOT DETECTED Final   Rotavirus A NOT DETECTED NOT DETECTED Final    Comment: RESULT CALLED TO, READ BACK BY AND VERIFIED WITH: Deyanne Forester, RN 1202 06/19/23 GM    Sapovirus (I, II, IV, and V) NOT DETECTED NOT DETECTED Final    Comment: Performed at [facility] [address] [place], [place] [address]  Cath Tip Culture     Status: None (Preliminary result)   Collection Time: 06/19/23 10:09 AM   Specimen: Catheter Tip  Result Value Ref Range Status   Specimen Description CATH TIP  Final   Special Requests NONE  Final   Culture   Final    CULTURE REINCUBATED FOR BETTER GROWTH Performed at [facility], [address], [place], [place] [address]    Report Status PENDING  Incomplete  Culture, blood (Routine X 2) w Reflex to ID Panel     Status: None (Preliminary result)   Collection Time: 06/20/23  5:14 AM   Specimen: BLOOD  Result Value Ref Range Status   Specimen Description BLOOD BLOOD RIGHT HAND  Final   Special Requests   Final    BOTTLES DRAWN AEROBIC AND ANAEROBIC Blood Culture adequate volume   Culture   Final    NO GROWTH 2 DAYS Performed at [facility], [address] [place], [place] [address]    Report Status PENDING  Incomplete  Culture, blood (Routine X 2) w Reflex to ID Panel     Status: None (Preliminary result)   Collection Time: 06/20/23  5:14 AM   Specimen: BLOOD  Result Value Ref Range Status   Specimen Description BLOOD BLOOD LEFT ARM  Final    Special Requests   Final    BOTTLES DRAWN AEROBIC AND ANAEROBIC Blood Culture adequate volume   Culture   Final    NO GROWTH 2 DAYS Performed at [facility], [address], [place], [place] [address]    Report Status PENDING  Incomplete         Radiology Studies: No results found.       Scheduled Meds:  cetirizine   10 mg Oral QPM   Chlorhexidine  Gluconate Cloth  6 each Topical Daily   enoxaparin  (LOVENOX ) injection  40 mg Subcutaneous Q24H   feeding supplement  237 mL Oral BID BM   insulin  aspart  0-15 Units Subcutaneous TID WC   lidocaine -EPINEPHrine -tetracaine   3 mL Topical Once   pantoprazole   40 mg Oral Daily   rosuvastatin   20 mg Oral Daily   saccharomyces boulardii  250 mg Oral BID   sodium chloride  flush  10-40 mL Intracatheter Q12H   sodium chloride  flush  3-10 mL Intravenous Q12H   Continuous Infusions:   ceFAZolin  (ANCEF ) IV 2 g (06/22/23 0446)     LOS: 4 days     Alphonsus Jeans, MD [facility] Pager [phone]  If 7PM-7AM, please contact night-coverage [email or web address] 06/22/2023, 8:20 AM

## 2023-06-22 NOTE — Progress Notes (Addendum)
 INFECTIOUS DISEASE PROGRESS NOTE Date of Admission:  06/17/2023     ID: Kathleen Russell is a 68 y.o. female with  MSSA bacteremia, port infection Principal Problem:   SIRS (systemic inflammatory response syndrome) (HCC) Active Problems:   Dyslipidemia   Type 2 diabetes mellitus without complications (HCC)   Pancreatic cancer (HCC)   Subjective: Feels well, up and walking. Denies fevers  ROS  Eleven systems are reviewed and negative except per hpi  Medications:  Antibiotics Given (last 72 hours)     Date/Time Action Medication Dose Rate   06/19/23 2024 New Bag/Given   ceFAZolin  (ANCEF ) IVPB 2g/100 mL premix 2 g 200 mL/hr   06/20/23 0411 New Bag/Given   ceFAZolin  (ANCEF ) IVPB 2g/100 mL premix 2 g 200 mL/hr   06/20/23 1318 New Bag/Given   ceFAZolin  (ANCEF ) IVPB 2g/100 mL premix 2 g 200 mL/hr   06/20/23 2052 New Bag/Given   ceFAZolin  (ANCEF ) IVPB 2g/100 mL premix 2 g 200 mL/hr   06/21/23 0410 New Bag/Given   ceFAZolin  (ANCEF ) IVPB 2g/100 mL premix 2 g 200 mL/hr   06/21/23 1151 New Bag/Given   ceFAZolin  (ANCEF ) IVPB 2g/100 mL premix 2 g 200 mL/hr   06/21/23 2133 New Bag/Given  [inadequate  staffing]   ceFAZolin  (ANCEF ) IVPB 2g/100 mL premix 2 g 200 mL/hr   06/22/23 0446 New Bag/Given   ceFAZolin  (ANCEF ) IVPB 2g/100 mL premix 2 g 200 mL/hr   06/22/23 1214 New Bag/Given   ceFAZolin  (ANCEF ) IVPB 2g/100 mL premix 2 g 200 mL/hr       cetirizine   10 mg Oral QPM   Chlorhexidine  Gluconate Cloth  6 each Topical Daily   enoxaparin  (LOVENOX ) injection  40 mg Subcutaneous Q24H   feeding supplement  237 mL Oral BID BM   insulin  aspart  0-15 Units Subcutaneous TID WC   lidocaine -EPINEPHrine -tetracaine   3 mL Topical Once   pantoprazole   40 mg Oral Daily   rosuvastatin   20 mg Oral Daily   saccharomyces boulardii  250 mg Oral BID   sodium chloride  flush  10-40 mL Intracatheter Q12H   sodium chloride  flush  3-10 mL Intravenous Q12H    Objective: Vital signs in last 24 hours: Temp:   [97.6 F (36.4 C)-98.2 F (36.8 C)] 98 F (36.7 C) (06/16 1141) Pulse Rate:  [90-99] 99 (06/16 1141) Resp:  [16-18] 17 (06/16 1141) BP: (99-117)/(54-74) 114/74 (06/16 1141) SpO2:  [98 %-100 %] 100 % (06/16 1141) Physical Exam  Constitutional:  oriented to person, place, and time. appears well-developed and well-nourished. No distress.  HENT: [place]/AT, PERRLA, no scleral icterus Mouth/Throat: Oropharynx is clear and moist. No oropharyngeal exudate.  R chest wall port site covered  Cardiovascular: Normal rate, regular rhythm and normal heart sounds. Pulmonary/Chest: Effort normal and breath sounds normal. No respiratory distress.  has no wheezes.  Neck = supple, no nuchal rigidity Abdominal: Soft. Bowel sounds are normal.  exhibits no distension. There is no tenderness.  Lymphadenopathy: no cervical adenopathy. No axillary adenopathy Neurological: alert and oriented to person, place, and time.  Skin: Skin is warm and dry. No rash noted. No erythema.  Psychiatric: a normal mood and affect.  behavior is normal.    Lab Results Recent Labs    06/21/23 0634 06/22/23 0219  WBC 2.1* 2.2*  HGB 10.4* 9.6*  HCT 30.4* 30.0*  NA 137 139  K 3.7 3.8  CL 104 108  CO2 26 24  BUN 10 9  CREATININE 0.54 0.47    Microbiology: Results  for orders placed or performed during the hospital encounter of 06/17/23  Culture, blood (Routine x 2)     Status: Abnormal   Collection Time: 06/17/23 10:12 PM   Specimen: Right Antecubital; Blood  Result Value Ref Range Status   Specimen Description   Final    RIGHT ANTECUBITAL Performed at [facility], [address] [place], [place] [address]    Special Requests   Final    BOTTLES DRAWN AEROBIC AND ANAEROBIC Blood Culture adequate volume Performed at [facility], [address], [place], [place] [address]    Culture  Setup Time   Final    GRAM POSITIVE COCCI IN BOTH AEROBIC AND ANAEROBIC BOTTLES CRITICAL VALUE NOTED.  VALUE IS  CONSISTENT WITH PREVIOUSLY REPORTED AND CALLED VALUE. Performed at [facility], [address] [place], [place] [address]    Culture (A)  Final    STAPHYLOCOCCUS AUREUS SUSCEPTIBILITIES PERFORMED ON PREVIOUS CULTURE WITHIN THE LAST 5 DAYS. Performed at [facility], [address] [place], [place] [address]    Report Status 06/20/2023 FINAL  Final  Resp panel by RT-PCR (RSV, Flu A&B, Covid) Anterior Nasal Swab     Status: None   Collection Time: 06/17/23 10:12 PM   Specimen: Anterior Nasal Swab  Result Value Ref Range Status   SARS Coronavirus 2 by RT PCR NEGATIVE NEGATIVE Final    Comment: (NOTE) SARS-CoV-2 target nucleic acids are NOT DETECTED.  The SARS-CoV-2 RNA is generally detectable in upper respiratory specimens during the acute phase of infection. The lowest concentration of SARS-CoV-2 viral copies this assay can detect is 138 copies/mL. A negative result does not preclude SARS-Cov-2 infection and should not be used as the sole basis for treatment or other patient management decisions. A negative result may occur with  improper specimen collection/handling, submission of specimen other than nasopharyngeal swab, presence of viral mutation(s) within the areas targeted by this assay, and inadequate number of viral copies(<138 copies/mL). A negative result must be combined with clinical observations, patient history, and epidemiological information. The expected result is Negative.  Fact Sheet for Patients:  BloggerCourse.com  Fact Sheet for Healthcare Providers:  SeriousBroker.it  This test is no t yet approved or cleared by the [place]  FDA and  has been authorized for detection and/or diagnosis of SARS-CoV-2 by FDA under an Emergency Use Authorization (EUA). This EUA will remain  in effect (meaning this test can be used) for the duration of the COVID-19 declaration under Section 564(b)(1) of the  Act, 21 U.S.C.section 360bbb-3(b)(1), unless the authorization is terminated  or revoked sooner.       Influenza A by PCR NEGATIVE NEGATIVE Final   Influenza B by PCR NEGATIVE NEGATIVE Final    Comment: (NOTE) The Xpert Xpress SARS-CoV-2/FLU/RSV plus assay is intended as an aid in the diagnosis of influenza from Nasopharyngeal swab specimens and should not be used as a sole basis for treatment. Nasal washings and aspirates are unacceptable for Xpert Xpress SARS-CoV-2/FLU/RSV testing.  Fact Sheet for Patients: BloggerCourse.com  Fact Sheet for Healthcare Providers: SeriousBroker.it  This test is not yet approved or cleared by the [place]  FDA and has been authorized for detection and/or diagnosis of SARS-CoV-2 by FDA under an Emergency Use Authorization (EUA). This EUA will remain in effect (meaning this test can be used) for the duration of the COVID-19 declaration under Section 564(b)(1) of the Act, 21 U.S.C. section 360bbb-3(b)(1), unless the authorization is terminated or revoked.  Resp Syncytial Virus by PCR NEGATIVE NEGATIVE Final    Comment: (NOTE) Fact Sheet for Patients: BloggerCourse.com  Fact Sheet for Healthcare Providers: SeriousBroker.it  This test is not yet approved or cleared by the [place]  FDA and has been authorized for detection and/or diagnosis of SARS-CoV-2 by FDA under an Emergency Use Authorization (EUA). This EUA will remain in effect (meaning this test can be used) for the duration of the COVID-19 declaration under Section 564(b)(1) of the Act, 21 U.S.C. section 360bbb-3(b)(1), unless the authorization is terminated or revoked.  Performed at [facility], [address] [place], [place] [address]   Culture, blood (Routine x 2)     Status: Abnormal   Collection Time: 06/17/23 10:17 PM   Specimen: BLOOD RIGHT HAND  Result  Value Ref Range Status   Specimen Description   Final    BLOOD RIGHT HAND Performed at [facility], [address] [place], [place] [address]    Special Requests   Final    BOTTLES DRAWN AEROBIC AND ANAEROBIC Blood Culture results may not be optimal due to an inadequate volume of blood received in culture bottles Performed at [facility], [address], [place], [place] [address]    Culture  Setup Time   Final    GRAM POSITIVE COCCI IN BOTH AEROBIC AND ANAEROBIC BOTTLES CRITICAL RESULT CALLED TO, READ BACK BY AND VERIFIED WITH: EMILY STEINBOCK 06/18/23 1414 MW Performed at [facility], [address], [place], [place] [address]    Culture STAPHYLOCOCCUS AUREUS (A)  Final   Report Status 06/20/2023 FINAL  Final   Organism ID, Bacteria STAPHYLOCOCCUS AUREUS  Final      Susceptibility   Staphylococcus aureus - MIC*    CIPROFLOXACIN  <=0.5 SENSITIVE Sensitive     ERYTHROMYCIN >=8 RESISTANT Resistant     GENTAMICIN <=0.5 SENSITIVE Sensitive     OXACILLIN 0.5 SENSITIVE Sensitive     TETRACYCLINE <=1 SENSITIVE Sensitive     VANCOMYCIN  1 SENSITIVE Sensitive     TRIMETH/SULFA <=10 SENSITIVE Sensitive     CLINDAMYCIN <=0.25 SENSITIVE Sensitive     RIFAMPIN <=0.5 SENSITIVE Sensitive     Inducible Clindamycin NEGATIVE Sensitive     LINEZOLID 2 SENSITIVE Sensitive     * STAPHYLOCOCCUS AUREUS  Blood Culture ID Panel (Reflexed)     Status: Abnormal   Collection Time: 06/17/23 10:17 PM  Result Value Ref Range Status   Enterococcus faecalis NOT DETECTED NOT DETECTED Final   Enterococcus Faecium NOT DETECTED NOT DETECTED Final   Listeria monocytogenes NOT DETECTED NOT DETECTED Final   Staphylococcus species DETECTED (A) NOT DETECTED Final    Comment: CRITICAL RESULT CALLED TO, READ BACK BY AND VERIFIED WITH: EMILY STEINBOCK 06/18/23 1414 MW    Staphylococcus aureus (BCID) DETECTED (A) NOT DETECTED Final    Comment: CRITICAL RESULT CALLED TO, READ BACK BY AND  VERIFIED WITH: EMILY STEINBOCK 06/18/23 1414 MW    Staphylococcus epidermidis NOT DETECTED NOT DETECTED Final   Staphylococcus lugdunensis NOT DETECTED NOT DETECTED Final   Streptococcus species NOT DETECTED NOT DETECTED Final   Streptococcus agalactiae NOT DETECTED NOT DETECTED Final   Streptococcus pneumoniae NOT DETECTED NOT DETECTED Final   Streptococcus pyogenes NOT DETECTED NOT DETECTED Final   A.calcoaceticus-baumannii NOT DETECTED NOT DETECTED Final   Bacteroides fragilis NOT DETECTED NOT DETECTED Final   Enterobacterales NOT DETECTED NOT DETECTED Final   Enterobacter cloacae complex NOT DETECTED NOT DETECTED Final   Escherichia coli NOT DETECTED NOT  DETECTED Final   Klebsiella aerogenes NOT DETECTED NOT DETECTED Final   Klebsiella oxytoca NOT DETECTED NOT DETECTED Final   Klebsiella pneumoniae NOT DETECTED NOT DETECTED Final   Proteus species NOT DETECTED NOT DETECTED Final   Salmonella species NOT DETECTED NOT DETECTED Final   Serratia marcescens NOT DETECTED NOT DETECTED Final   Haemophilus influenzae NOT DETECTED NOT DETECTED Final   Neisseria meningitidis NOT DETECTED NOT DETECTED Final   Pseudomonas aeruginosa NOT DETECTED NOT DETECTED Final   Stenotrophomonas maltophilia NOT DETECTED NOT DETECTED Final   Candida albicans NOT DETECTED NOT DETECTED Final   Candida auris NOT DETECTED NOT DETECTED Final   Candida glabrata NOT DETECTED NOT DETECTED Final   Candida krusei NOT DETECTED NOT DETECTED Final   Candida parapsilosis NOT DETECTED NOT DETECTED Final   Candida tropicalis NOT DETECTED NOT DETECTED Final   Cryptococcus neoformans/gattii NOT DETECTED NOT DETECTED Final   Meth resistant mecA/C and MREJ NOT DETECTED NOT DETECTED Final    Comment: Performed at [facility], [address] [place], [place] [address]  Urine Culture     Status: None   Collection Time: 06/17/23 11:46 PM   Specimen: Urine, Random  Result Value Ref Range Status   Specimen  Description   Final    URINE, RANDOM Performed at [facility], [address] [place], [place] [address]    Special Requests   Final    NONE Performed at [facility], [address], [place], [place] [address]    Culture   Final    NO GROWTH Performed at [facility], [address], [place], [place] [address]    Report Status 06/19/2023 FINAL  Final  Culture, blood (Routine X 2) w Reflex to ID Panel     Status: None (Preliminary result)   Collection Time: 06/18/23 11:57 PM   Specimen: BLOOD  Result Value Ref Range Status   Specimen Description BLOOD BLOOD LEFT ARM  Final   Special Requests   Final    BOTTLES DRAWN AEROBIC AND ANAEROBIC Blood Culture adequate volume   Culture   Final    NO GROWTH 3 DAYS Performed at [facility], [address], [place], [place] [address]    Report Status PENDING  Incomplete  Culture, blood (Routine X 2) w Reflex to ID Panel     Status: None (Preliminary result)   Collection Time: 06/19/23 12:18 AM   Specimen: BLOOD  Result Value Ref Range Status   Specimen Description BLOOD BLOOD LEFT ARM  Final   Special Requests   Final    BOTTLES DRAWN AEROBIC AND ANAEROBIC Blood Culture results may not be optimal due to an inadequate volume of blood received in culture bottles   Culture   Final    NO GROWTH 3 DAYS Performed at [facility], [address], [place], [place] [address]    Report Status PENDING  Incomplete  C Difficile Quick Screen w PCR reflex     Status: None   Collection Time: 06/19/23  9:40 AM   Specimen: Catheter Tip; Stool  Result Value Ref Range Status   C Diff antigen NEGATIVE NEGATIVE Final   C Diff toxin NEGATIVE NEGATIVE Final   C Diff interpretation No C. difficile detected.  Final    Comment: Performed at [facility], [address],  [place], [place] [address]  Gastrointestinal Panel by PCR , Stool     Status: Abnormal   Collection Time: 06/19/23  9:40  AM    Specimen: Catheter Tip; Stool  Result Value Ref Range Status   Campylobacter species NOT DETECTED NOT DETECTED Final   Plesimonas shigelloides NOT DETECTED NOT DETECTED Final   Salmonella species NOT DETECTED NOT DETECTED Final   Yersinia enterocolitica NOT DETECTED NOT DETECTED Final   Vibrio species NOT DETECTED NOT DETECTED Final   Vibrio cholerae NOT DETECTED NOT DETECTED Final   Enteroaggregative E coli (EAEC) NOT DETECTED NOT DETECTED Final   Enteropathogenic E coli (EPEC) NOT DETECTED NOT DETECTED Final   Enterotoxigenic E coli (ETEC) NOT DETECTED NOT DETECTED Final   Shiga like toxin producing E coli (STEC) NOT DETECTED NOT DETECTED Final   Shigella/Enteroinvasive E coli (EIEC) NOT DETECTED NOT DETECTED Final   Cryptosporidium NOT DETECTED NOT DETECTED Final   Cyclospora cayetanensis NOT DETECTED NOT DETECTED Final   Entamoeba histolytica NOT DETECTED NOT DETECTED Final   Giardia lamblia NOT DETECTED NOT DETECTED Final   Adenovirus F40/41 NOT DETECTED NOT DETECTED Final   Astrovirus NOT DETECTED NOT DETECTED Final   Norovirus GI/GII DETECTED (A) NOT DETECTED Final   Rotavirus A NOT DETECTED NOT DETECTED Final    Comment: RESULT CALLED TO, READ BACK BY AND VERIFIED WITH: Deyanne Forester, RN 1202 06/19/23 GM    Sapovirus (I, II, IV, and V) NOT DETECTED NOT DETECTED Final    Comment: Performed at [facility], [address], [place], [place] [address]  Cath Tip Culture     Status: None (Preliminary result)   Collection Time: 06/19/23 10:09 AM   Specimen: Catheter Tip  Result Value Ref Range Status   Specimen Description CATH TIP  Final   Special Requests NONE  Final   Culture   Final    <15 CFU STAPHYLOCOCCUS AUREUS SUSCEPTIBILITIES TO FOLLOW Performed at [facility], [address] [place], [place] [address]    Report Status PENDING  Incomplete  Culture, blood (Routine X 2) w Reflex to ID Panel     Status: None (Preliminary result)   Collection Time:  06/20/23  5:14 AM   Specimen: BLOOD  Result Value Ref Range Status   Specimen Description BLOOD BLOOD RIGHT HAND  Final   Special Requests   Final    BOTTLES DRAWN AEROBIC AND ANAEROBIC Blood Culture adequate volume   Culture   Final    NO GROWTH 2 DAYS Performed at [facility], [address] [place], [place] [address]    Report Status PENDING  Incomplete  Culture, blood (Routine X 2) w Reflex to ID Panel     Status: None (Preliminary result)   Collection Time: 06/20/23  5:14 AM   Specimen: BLOOD  Result Value Ref Range Status   Specimen Description BLOOD BLOOD LEFT ARM  Final   Special Requests   Final    BOTTLES DRAWN AEROBIC AND ANAEROBIC Blood Culture adequate volume   Culture   Final    NO GROWTH 2 DAYS Performed at [facility], [address] [place], [place] [address]    Report Status PENDING  Incomplete    Studies/Results: No results found.  Assessment/Plan: 68 yo female raynayd, dm2, gerd, recently dx'ed with pancreatic tail cancer, neoadjuvant chemotherapy (cycle 4 of 6 last given about 2 weeks prior to admission), admitted 6/11 for several days malaise found to have sepsis and mssa bacteremia. Norovirus + stool  Source port vs gut tranlocation 6/11 bcx positive 6/11 ucx negative 6/12 bcx negative 6/13 bcx (prior to removal of port) ngtd  6/13 catheter tip < 15 CFU Staph aureus.  No other focal pain or ortho/cardiac device. She has left thumb carpal-metacarpal tendon repair in the past and no sx at this time   6/16-  TTE neg.Aaron Aas Port removed, tip+, Cleared  blood cultures cultures   Plan: TEE sched for Wed Continue cefazolin  Duration at least 4 weeks given immunosuppressant/community onset mssa bsi; echo will determine final duration No other metastatic foci per history/exam at this time From id standpoint, would like to see clearance of bacteremia and if echo finds endocarditis would like to have 1-2 weeks clearance of bacteremia/clinical  improvement prior to starting chemo again Can use picc for chemo or consider port replacement once resolved.  Thank you very much for the consult. Will follow with you.  Kathleen Russell   06/22/2023, 2:15 PM

## 2023-06-22 NOTE — Plan of Care (Signed)
  Problem: Fluid Volume: Goal: Hemodynamic stability will improve Outcome: Progressing   Problem: Clinical Measurements: Goal: Diagnostic test results will improve Outcome: Progressing Goal: Signs and symptoms of infection will decrease Outcome: Progressing   Problem: Respiratory: Goal: Ability to maintain adequate ventilation will improve Outcome: Progressing   Problem: Education: Goal: Ability to describe self-care measures that may prevent or decrease complications (Diabetes Survival Skills Education) will improve Outcome: Progressing Goal: Individualized Educational Video(s) Outcome: Progressing   Problem: Coping: Goal: Ability to adjust to condition or change in health will improve Outcome: Progressing   Problem: Fluid Volume: Goal: Ability to maintain a balanced intake and output will improve Outcome: Progressing   Problem: Health Behavior/Discharge Planning: Goal: Ability to identify and utilize available resources and services will improve Outcome: Progressing Goal: Ability to manage health-related needs will improve Outcome: Progressing   Problem: Metabolic: Goal: Ability to maintain appropriate glucose levels will improve Outcome: Progressing   Problem: Nutritional: Goal: Maintenance of adequate nutrition will improve Outcome: Progressing Goal: Progress toward achieving an optimal weight will improve Outcome: Progressing   Problem: Skin Integrity: Goal: Risk for impaired skin integrity will decrease Outcome: Progressing   Problem: Tissue Perfusion: Goal: Adequacy of tissue perfusion will improve Outcome: Progressing   Problem: Education: Goal: Knowledge of General Education information will improve Description: Including pain rating scale, medication(s)/side effects and non-pharmacologic comfort measures Outcome: Progressing   Problem: Health Behavior/Discharge Planning: Goal: Ability to manage health-related needs will improve Outcome:  Progressing   Problem: Clinical Measurements: Goal: Ability to maintain clinical measurements within normal limits will improve Outcome: Progressing Goal: Will remain free from infection Outcome: Progressing Goal: Diagnostic test results will improve Outcome: Progressing Goal: Respiratory complications will improve Outcome: Progressing Goal: Cardiovascular complication will be avoided Outcome: Progressing   Problem: Activity: Goal: Risk for activity intolerance will decrease Outcome: Progressing   Problem: Nutrition: Goal: Adequate nutrition will be maintained Outcome: Progressing   Problem: Coping: Goal: Level of anxiety will decrease Outcome: Progressing   Problem: Elimination: Goal: Will not experience complications related to bowel motility Outcome: Progressing Goal: Will not experience complications related to urinary retention Outcome: Progressing   Problem: Pain Managment: Goal: General experience of comfort will improve and/or be controlled Outcome: Progressing   Problem: Safety: Goal: Ability to remain free from injury will improve Outcome: Progressing   Problem: Skin Integrity: Goal: Risk for impaired skin integrity will decrease Outcome: Progressing   Problem: Education: Goal: Knowledge of General Education information will improve Description: Including pain rating scale, medication(s)/side effects and non-pharmacologic comfort measures Outcome: Progressing

## 2023-06-23 DIAGNOSIS — R7881 Bacteremia: Secondary | ICD-10-CM | POA: Diagnosis not present

## 2023-06-23 DIAGNOSIS — B9561 Methicillin susceptible Staphylococcus aureus infection as the cause of diseases classified elsewhere: Secondary | ICD-10-CM | POA: Diagnosis not present

## 2023-06-23 LAB — CBC WITH DIFFERENTIAL/PLATELET
Abs Immature Granulocytes: 0.09 10*3/uL — ABNORMAL HIGH (ref 0.00–0.07)
Basophils Absolute: 0 10*3/uL (ref 0.0–0.1)
Basophils Relative: 1 %
Eosinophils Absolute: 0.1 10*3/uL (ref 0.0–0.5)
Eosinophils Relative: 2 %
HCT: 30.2 % — ABNORMAL LOW (ref 36.0–46.0)
Hemoglobin: 10.1 g/dL — ABNORMAL LOW (ref 12.0–15.0)
Immature Granulocytes: 2 %
Lymphocytes Relative: 55 %
Lymphs Abs: 2.1 10*3/uL (ref 0.7–4.0)
MCH: 28.7 pg (ref 26.0–34.0)
MCHC: 33.4 g/dL (ref 30.0–36.0)
MCV: 85.8 fL (ref 80.0–100.0)
Monocytes Absolute: 0.4 10*3/uL (ref 0.1–1.0)
Monocytes Relative: 12 %
Neutro Abs: 1.1 10*3/uL — ABNORMAL LOW (ref 1.7–7.7)
Neutrophils Relative %: 28 %
Platelets: 200 10*3/uL (ref 150–400)
RBC: 3.52 MIL/uL — ABNORMAL LOW (ref 3.87–5.11)
RDW: 14.6 % (ref 11.5–15.5)
WBC: 3.8 10*3/uL — ABNORMAL LOW (ref 4.0–10.5)
nRBC: 0.8 % — ABNORMAL HIGH (ref 0.0–0.2)

## 2023-06-23 LAB — BASIC METABOLIC PANEL WITH GFR
Anion gap: 7 (ref 5–15)
BUN: 9 mg/dL (ref 8–23)
CO2: 28 mmol/L (ref 22–32)
Calcium: 8.3 mg/dL — ABNORMAL LOW (ref 8.9–10.3)
Chloride: 105 mmol/L (ref 98–111)
Creatinine, Ser: 0.55 mg/dL (ref 0.44–1.00)
GFR, Estimated: >60 mL/min (ref >60)
Glucose, Bld: 199 mg/dL — ABNORMAL HIGH (ref 70–99)
Potassium: 3.1 mmol/L — ABNORMAL LOW (ref 3.5–5.1)
Sodium: 140 mmol/L (ref 135–145)

## 2023-06-23 LAB — CATH TIP CULTURE: Culture: NO GROWTH — AB

## 2023-06-23 LAB — GLUCOSE, CAPILLARY
Glucose-Capillary: 160 mg/dL — ABNORMAL HIGH (ref 70–99)
Glucose-Capillary: 136 mg/dL — ABNORMAL HIGH (ref 70–99)
Glucose-Capillary: 117 mg/dL — ABNORMAL HIGH (ref 70–99)
Glucose-Capillary: 126 mg/dL — ABNORMAL HIGH (ref 70–99)

## 2023-06-23 MED ORDER — POTASSIUM CHLORIDE 20 MEQ PO PACK
40.0000 meq | PACK | Freq: Two times a day (BID) | ORAL | Status: AC
  Administered 2023-06-23 (×2): 40 meq via ORAL
  Filled 2023-06-23 (×2): qty 2

## 2023-06-23 NOTE — Progress Notes (Signed)
 PROGRESS NOTE   HPI was taken from Dr. Achilles Holes: Kathleen Russell is a 68 y.o. Caucasian female with medical history significant for pancreatic cancer on chemotherapy proceeding with surgical intervention, GERD, allergic rhinitis, dyslipidemia and type 2 diabetes mellitus as well as Raynaud's phenomenon who presented to the emergency room with acute onset of fever that was up to 102.7 with associated chills that started around 7:30 PM.  The patient admitted to dysuria without urinary frequency or urgency or hematuria or flank pain.  She admitted to nausea and vomiting since yesterday morning.  No chest pain or palpitations.  No cough or wheezing or dyspnea.  No rhinorrhea or nasal congestion or sore throat or earache.  No bleeding diathesis.   ED Course: When the patient came to the ER, temperature was 99.9 with heart rate of 154 and respiratory rate of 22.  Heart rate was later 108.Labs revealed mild hyponatremia 131 and blood glucose of 200.  Actiq  acid was 1.5 later 1.  CBC showed mild leukopenia of 3.1 with ANC of 2 and lymphopenia.  Respiratory panel came back negative.  UA was negative. EKG as reviewed by me : EKG showed sinus tachycardia with a rate of 120 with low voltage QRS and inferior Q waves. Imaging: 2 view chest x-ray showed no acute cardiopulmonary disease.   The patient was given 650 mg of p.o. Tylenol  and 1 L bolus of IV normal saline as well as 2 g of IV cefepime , Flagyl  and vancomycin .  The patient will be admitted to a medical telemetry observation bed for further evaluation and management.   Kathleen Russell  ZOX:[id number] DOB: December 16, 1955 DOA: 06/17/2023 PCP: Thersia Flax, MD    Assessment & Plan:   Principal Problem:   SIRS (systemic inflammatory response syndrome) (HCC) Active Problems:   Dyslipidemia   Type 2 diabetes mellitus without complications (HCC)   Pancreatic cancer (HCC)  Assessment and Plan:  MSSA bacteremia: etiology unclear, possibly secondary to chemo  port. S/p chemo port removed 06/19/23. Continue on IV cefazolin  x 4 weeks as per ID. Repeat blood cxs NGTD. Echo was neg for vegetations. TEE will be on 06/24/23 as per cardio. Will need a PICC line prior to d/c. ID following and recs apprec. Cardio recs apprec    Sinus tachycardia: s/p cardizem  IV x 1. Continue on tele    Diarrhea: norovirus was found on GI PCR panel. Continue w/ supportive care   Hypokalemia: potassium given  Hypophosphatemia: resolved  Hypomagnesemia: resolved   Pancreatic adenocarcinoma: stage II. On chemo. Will f/u outpatient w/ onco, Dr. Jinx Mourning: likely secondary to recent chemo. Platelets are WNL. No need for a transfusion currently    DM2: fair control, HbA1c 7.4. Continue on SSI w/ accuchecks    HLD: continue on statin     DVT prophylaxis: lovenox   Code Status: full  Family Communication:  Disposition Plan: likely d/c back home   Level of care: Telemetry Medical  Status is: Inpatient Remains inpatient appropriate because: severity of illness, TEE tomorrow and will need a PICC line prior to d/c    Consultants:  ID Cardio   Procedures:   Antimicrobials: cefazolin     Subjective: Pt c/o fatigue.  Objective: Vitals:   06/22/23 2010 06/22/23 2032 06/23/23 0020 06/23/23 0410  BP: 130/80 107/72 102/62 117/74  Pulse: (!) 120 (!) 108 100 98  Resp:      Temp: 97.9 F (36.6 C) 97.8 F (36.6 C) 97.7 F (36.5 C) 97.8 F (  36.6 C)  TempSrc: Oral     SpO2: 97% 97% 100% 98%  Weight:      Height:        Intake/Output Summary (Last 24 hours) at 06/23/2023 0815 Last data filed at 06/22/2023 1554 Gross per 24 hour  Intake 160 ml  Output --  Net 160 ml   Filed Weights   06/17/23 2108  Weight: 74.8 kg    Examination:  General exam: appears comfortable  Respiratory system: clear breath sounds b/l  Cardiovascular system: S1/S2+. No rubs or clicks  Gastrointestinal system: abd is soft, NT, ND & hypoactive bowel sounds  Central  nervous system: alert & oriented. Moves all extremities  Psychiatry: judgement and insight appears normal. Appropriate mood and affect     Data Reviewed: I have personally reviewed following labs and imaging studies  CBC: Recent Labs  Lab 06/17/23 2227 06/18/23 0259 06/19/23 0602 06/20/23 0515 06/21/23 0634 06/22/23 0219 06/23/23 0542  WBC 3.1*   < > 1.5* 1.7* 2.1* 2.2* 3.8*  NEUTROABS 2.0  --   --  0.5* 0.5* 0.8* 1.1*  HGB 12.6   < > 10.2* 10.5* 10.4* 9.6* 10.1*  HCT 39.1   < > 29.7* 31.5* 30.4* 30.0* 30.2*  MCV 87.3   < > 85.3 85.8 84.4 85.5 85.8  PLT 151   < > 111* 126* 152 175 200   < > = values in this interval not displayed.   Basic Metabolic Panel: Recent Labs  Lab 06/18/23 0935 06/18/23 2357 06/19/23 0602 06/20/23 0515 06/21/23 0634 06/22/23 0219 06/23/23 0542  NA 133* 132* 133* 139 137 139 140  K 3.0* 3.3* 3.5 3.2* 3.7 3.8 3.1*  CL 103 102 101 105 104 108 105  CO2 22 22 23 25 26 24 28   GLUCOSE 212* 122* 131* 161* 168* 197* 199*  BUN 8 7* 6* 7* 10 9 9   CREATININE 0.53 0.48 0.67 0.53 0.54 0.47 0.55  CALCIUM  7.6* 7.6* 7.6* 7.9* 8.2* 8.4* 8.3*  MG 1.6* 1.9 1.8 2.1 2.1  --   --   PHOS 2.2* 2.8 3.0 2.8 2.7  --   --    GFR: Estimated Creatinine Clearance: 66.6 mL/min (by C-G formula based on SCr of 0.55 mg/dL). Liver Function Tests: Recent Labs  Lab 06/17/23 2227  AST 14*  ALT 12  ALKPHOS 56  BILITOT 0.5  PROT 5.9*  ALBUMIN 3.0*   No results for input(s): LIPASE, AMYLASE in the last 168 hours. No results for input(s): AMMONIA in the last 168 hours. Coagulation Profile: Recent Labs  Lab 06/17/23 2212 06/18/23 0935  INR 1.0 1.2   Cardiac Enzymes: No results for input(s): CKTOTAL, CKMB, CKMBINDEX, TROPONINI in the last 168 hours. BNP (last 3 results) No results for input(s): PROBNP in the last 8760 hours. HbA1C: No results for input(s): HGBA1C in the last 72 hours. CBG: Recent Labs  Lab 06/21/23 2133 06/22/23 0739  06/22/23 1140 06/22/23 1622 06/22/23 2013  GLUCAP 126* 139* 146* 164* 151*   Lipid Profile: No results for input(s): CHOL, HDL, LDLCALC, TRIG, CHOLHDL, LDLDIRECT in the last 72 hours. Thyroid  Function Tests: No results for input(s): TSH, T4TOTAL, FREET4, T3FREE, THYROIDAB in the last 72 hours.  Anemia Panel: No results for input(s): VITAMINB12, FOLATE, FERRITIN, TIBC, IRON, RETICCTPCT in the last 72 hours. Sepsis Labs: Recent Labs  Lab 06/17/23 2212 06/17/23 2346  LATICACIDVEN 1.5 1.0    Recent Results (from the past 240 hours)  Culture, blood (Routine x 2)  Status: Abnormal   Collection Time: 06/17/23 10:12 PM   Specimen: Right Antecubital; Blood  Result Value Ref Range Status   Specimen Description   Final    RIGHT ANTECUBITAL Performed at [facility], [address] [place], [place] [address]    Special Requests   Final    BOTTLES DRAWN AEROBIC AND ANAEROBIC Blood Culture adequate volume Performed at [facility], [address] [place], [place] [address]    Culture  Setup Time   Final    GRAM POSITIVE COCCI IN BOTH AEROBIC AND ANAEROBIC BOTTLES CRITICAL VALUE NOTED.  VALUE IS CONSISTENT WITH PREVIOUSLY REPORTED AND CALLED VALUE. Performed at [facility], [address] [place], [place] [address]    Culture (A)  Final    STAPHYLOCOCCUS AUREUS SUSCEPTIBILITIES PERFORMED ON PREVIOUS CULTURE WITHIN THE LAST 5 DAYS. Performed at [facility], [address], [place], [place] [address]    Report Status 06/20/2023 FINAL  Final  Resp panel by RT-PCR (RSV, Flu A&B, Covid) Anterior Nasal Swab     Status: None   Collection Time: 06/17/23 10:12 PM   Specimen: Anterior Nasal Swab  Result Value Ref Range Status   SARS Coronavirus 2 by RT PCR NEGATIVE NEGATIVE Final    Comment: (NOTE) SARS-CoV-2 target nucleic acids are NOT DETECTED.  The SARS-CoV-2 RNA is generally detectable in upper  respiratory specimens during the acute phase of infection. The lowest concentration of SARS-CoV-2 viral copies this assay can detect is 138 copies/mL. A negative result does not preclude SARS-Cov-2 infection and should not be used as the sole basis for treatment or other patient management decisions. A negative result may occur with  improper specimen collection/handling, submission of specimen other than nasopharyngeal swab, presence of viral mutation(s) within the areas targeted by this assay, and inadequate number of viral copies(<138 copies/mL). A negative result must be combined with clinical observations, patient history, and epidemiological information. The expected result is Negative.  Fact Sheet for Patients:  BloggerCourse.com  Fact Sheet for Healthcare Providers:  SeriousBroker.it  This test is no t yet approved or cleared by the [place]  FDA and  has been authorized for detection and/or diagnosis of SARS-CoV-2 by FDA under an Emergency Use Authorization (EUA). This EUA will remain  in effect (meaning this test can be used) for the duration of the COVID-19 declaration under Section 564(b)(1) of the Act, 21 U.S.C.section 360bbb-3(b)(1), unless the authorization is terminated  or revoked sooner.       Influenza A by PCR NEGATIVE NEGATIVE Final   Influenza B by PCR NEGATIVE NEGATIVE Final    Comment: (NOTE) The Xpert Xpress SARS-CoV-2/FLU/RSV plus assay is intended as an aid in the diagnosis of influenza from Nasopharyngeal swab specimens and should not be used as a sole basis for treatment. Nasal washings and aspirates are unacceptable for Xpert Xpress SARS-CoV-2/FLU/RSV testing.  Fact Sheet for Patients: BloggerCourse.com  Fact Sheet for Healthcare Providers: SeriousBroker.it  This test is not yet approved or cleared by the [place]  FDA and has been  authorized for detection and/or diagnosis of SARS-CoV-2 by FDA under an Emergency Use Authorization (EUA). This EUA will remain in effect (meaning this test can be used) for the duration of the COVID-19 declaration under Section 564(b)(1) of the Act, 21 U.S.C. section 360bbb-3(b)(1), unless the authorization is terminated or revoked.     Resp Syncytial Virus by PCR NEGATIVE NEGATIVE Final    Comment: (NOTE) Fact Sheet for Patients: BloggerCourse.com  Fact  Sheet for Healthcare Providers: SeriousBroker.it  This test is not yet approved or cleared by the [place]  FDA and has been authorized for detection and/or diagnosis of SARS-CoV-2 by FDA under an Emergency Use Authorization (EUA). This EUA will remain in effect (meaning this test can be used) for the duration of the COVID-19 declaration under Section 564(b)(1) of the Act, 21 U.S.C. section 360bbb-3(b)(1), unless the authorization is terminated or revoked.  Performed at [facility], [address] [place], [place] [address]   Culture, blood (Routine x 2)     Status: Abnormal   Collection Time: 06/17/23 10:17 PM   Specimen: BLOOD RIGHT HAND  Result Value Ref Range Status   Specimen Description   Final    BLOOD RIGHT HAND Performed at [facility], [address] [place], [place] [address]    Special Requests   Final    BOTTLES DRAWN AEROBIC AND ANAEROBIC Blood Culture results may not be optimal due to an inadequate volume of blood received in culture bottles Performed at [facility], [address], [place], [place] [address]    Culture  Setup Time   Final    GRAM POSITIVE COCCI IN BOTH AEROBIC AND ANAEROBIC BOTTLES CRITICAL RESULT CALLED TO, READ BACK BY AND VERIFIED WITH: EMILY STEINBOCK 06/18/23 1414 MW Performed at [facility], [address], [place], [place] [address]    Culture STAPHYLOCOCCUS AUREUS (A)  Final   Report  Status 06/20/2023 FINAL  Final   Organism ID, Bacteria STAPHYLOCOCCUS AUREUS  Final      Susceptibility   Staphylococcus aureus - MIC*    CIPROFLOXACIN  <=0.5 SENSITIVE Sensitive     ERYTHROMYCIN >=8 RESISTANT Resistant     GENTAMICIN <=0.5 SENSITIVE Sensitive     OXACILLIN 0.5 SENSITIVE Sensitive     TETRACYCLINE <=1 SENSITIVE Sensitive     VANCOMYCIN  1 SENSITIVE Sensitive     TRIMETH/SULFA <=10 SENSITIVE Sensitive     CLINDAMYCIN <=0.25 SENSITIVE Sensitive     RIFAMPIN <=0.5 SENSITIVE Sensitive     Inducible Clindamycin NEGATIVE Sensitive     LINEZOLID 2 SENSITIVE Sensitive     * STAPHYLOCOCCUS AUREUS  Blood Culture ID Panel (Reflexed)     Status: Abnormal   Collection Time: 06/17/23 10:17 PM  Result Value Ref Range Status   Enterococcus faecalis NOT DETECTED NOT DETECTED Final   Enterococcus Faecium NOT DETECTED NOT DETECTED Final   Listeria monocytogenes NOT DETECTED NOT DETECTED Final   Staphylococcus species DETECTED (A) NOT DETECTED Final    Comment: CRITICAL RESULT CALLED TO, READ BACK BY AND VERIFIED WITH: EMILY STEINBOCK 06/18/23 1414 MW    Staphylococcus aureus (BCID) DETECTED (A) NOT DETECTED Final    Comment: CRITICAL RESULT CALLED TO, READ BACK BY AND VERIFIED WITH: EMILY STEINBOCK 06/18/23 1414 MW    Staphylococcus epidermidis NOT DETECTED NOT DETECTED Final   Staphylococcus lugdunensis NOT DETECTED NOT DETECTED Final   Streptococcus species NOT DETECTED NOT DETECTED Final   Streptococcus agalactiae NOT DETECTED NOT DETECTED Final   Streptococcus pneumoniae NOT DETECTED NOT DETECTED Final   Streptococcus pyogenes NOT DETECTED NOT DETECTED Final   A.calcoaceticus-baumannii NOT DETECTED NOT DETECTED Final   Bacteroides fragilis NOT DETECTED NOT DETECTED Final   Enterobacterales NOT DETECTED NOT DETECTED Final   Enterobacter cloacae complex NOT DETECTED NOT DETECTED Final   Escherichia coli NOT DETECTED NOT DETECTED Final   Klebsiella aerogenes NOT DETECTED NOT  DETECTED Final   Klebsiella oxytoca NOT DETECTED NOT DETECTED Final  Klebsiella pneumoniae NOT DETECTED NOT DETECTED Final   Proteus species NOT DETECTED NOT DETECTED Final   Salmonella species NOT DETECTED NOT DETECTED Final   Serratia marcescens NOT DETECTED NOT DETECTED Final   Haemophilus influenzae NOT DETECTED NOT DETECTED Final   Neisseria meningitidis NOT DETECTED NOT DETECTED Final   Pseudomonas aeruginosa NOT DETECTED NOT DETECTED Final   Stenotrophomonas maltophilia NOT DETECTED NOT DETECTED Final   Candida albicans NOT DETECTED NOT DETECTED Final   Candida auris NOT DETECTED NOT DETECTED Final   Candida glabrata NOT DETECTED NOT DETECTED Final   Candida krusei NOT DETECTED NOT DETECTED Final   Candida parapsilosis NOT DETECTED NOT DETECTED Final   Candida tropicalis NOT DETECTED NOT DETECTED Final   Cryptococcus neoformans/gattii NOT DETECTED NOT DETECTED Final   Meth resistant mecA/C and MREJ NOT DETECTED NOT DETECTED Final    Comment: Performed at [facility], [address] [place], [place] [address]  Urine Culture     Status: None   Collection Time: 06/17/23 11:46 PM   Specimen: Urine, Random  Result Value Ref Range Status   Specimen Description   Final    URINE, RANDOM Performed at [facility], [address] [place], [place] [address]    Special Requests   Final    NONE Performed at [facility], [address], [place], [place] [address]    Culture   Final    NO GROWTH Performed at [facility], [address], [place], [place] [address]    Report Status 06/19/2023 FINAL  Final  Culture, blood (Routine X 2) w Reflex to ID Panel     Status: None (Preliminary result)   Collection Time: 06/18/23 11:57 PM   Specimen: BLOOD  Result Value Ref Range Status   Specimen Description BLOOD BLOOD LEFT ARM  Final   Special Requests   Final    BOTTLES DRAWN AEROBIC AND ANAEROBIC Blood Culture adequate volume   Culture   Final     NO GROWTH 4 DAYS Performed at [facility], [address], [place], [place] [address]    Report Status PENDING  Incomplete  Culture, blood (Routine X 2) w Reflex to ID Panel     Status: None (Preliminary result)   Collection Time: 06/19/23 12:18 AM   Specimen: BLOOD  Result Value Ref Range Status   Specimen Description BLOOD BLOOD LEFT ARM  Final   Special Requests   Final    BOTTLES DRAWN AEROBIC AND ANAEROBIC Blood Culture results may not be optimal due to an inadequate volume of blood received in culture bottles   Culture   Final    NO GROWTH 4 DAYS Performed at [facility], [address], [place], [place] [address]    Report Status PENDING  Incomplete  C Difficile Quick Screen w PCR reflex     Status: None   Collection Time: 06/19/23  9:40 AM   Specimen: Catheter Tip; Stool  Result Value Ref Range Status   C Diff antigen NEGATIVE NEGATIVE Final   C Diff toxin NEGATIVE NEGATIVE Final   C Diff interpretation No C. difficile detected.  Final    Comment: Performed at [facility], [address] [place], [place] [address]  Gastrointestinal Panel by PCR , Stool     Status: Abnormal   Collection Time: 06/19/23  9:40 AM   Specimen: Catheter Tip; Stool  Result Value Ref Range Status   Campylobacter species NOT DETECTED NOT DETECTED Final  Plesimonas shigelloides NOT DETECTED NOT DETECTED Final   Salmonella species NOT DETECTED NOT DETECTED Final   Yersinia enterocolitica NOT DETECTED NOT DETECTED Final   Vibrio species NOT DETECTED NOT DETECTED Final   Vibrio cholerae NOT DETECTED NOT DETECTED Final   Enteroaggregative E coli (EAEC) NOT DETECTED NOT DETECTED Final   Enteropathogenic E coli (EPEC) NOT DETECTED NOT DETECTED Final   Enterotoxigenic E coli (ETEC) NOT DETECTED NOT DETECTED Final   Shiga like toxin producing E coli (STEC) NOT DETECTED NOT DETECTED Final   Shigella/Enteroinvasive E coli (EIEC) NOT DETECTED NOT DETECTED Final    Cryptosporidium NOT DETECTED NOT DETECTED Final   Cyclospora cayetanensis NOT DETECTED NOT DETECTED Final   Entamoeba histolytica NOT DETECTED NOT DETECTED Final   Giardia lamblia NOT DETECTED NOT DETECTED Final   Adenovirus F40/41 NOT DETECTED NOT DETECTED Final   Astrovirus NOT DETECTED NOT DETECTED Final   Norovirus GI/GII DETECTED (A) NOT DETECTED Final   Rotavirus A NOT DETECTED NOT DETECTED Final    Comment: RESULT CALLED TO, READ BACK BY AND VERIFIED WITH: Deyanne Forester, RN 1202 06/19/23 GM    Sapovirus (I, II, IV, and V) NOT DETECTED NOT DETECTED Final    Comment: Performed at [facility], [address] [place], [place] [address]  Cath Tip Culture     Status: None (Preliminary result)   Collection Time: 06/19/23 10:09 AM   Specimen: Catheter Tip  Result Value Ref Range Status   Specimen Description CATH TIP  Final   Special Requests NONE  Final   Culture   Final    <15 CFU STAPHYLOCOCCUS AUREUS SUSCEPTIBILITIES TO FOLLOW Performed at [facility], [address], [place], [place] [address]    Report Status PENDING  Incomplete  Culture, blood (Routine X 2) w Reflex to ID Panel     Status: None (Preliminary result)   Collection Time: 06/20/23  5:14 AM   Specimen: BLOOD  Result Value Ref Range Status   Specimen Description BLOOD BLOOD RIGHT HAND  Final   Special Requests   Final    BOTTLES DRAWN AEROBIC AND ANAEROBIC Blood Culture adequate volume   Culture   Final    NO GROWTH 3 DAYS Performed at [facility], [address], [place], [place] [address]    Report Status PENDING  Incomplete  Culture, blood (Routine X 2) w Reflex to ID Panel     Status: None (Preliminary result)   Collection Time: 06/20/23  5:14 AM   Specimen: BLOOD  Result Value Ref Range Status   Specimen Description BLOOD BLOOD LEFT ARM  Final   Special Requests   Final    BOTTLES DRAWN AEROBIC AND ANAEROBIC Blood Culture adequate volume   Culture   Final    NO GROWTH 3  DAYS Performed at [facility], [address] [place], [place] [address]    Report Status PENDING  Incomplete         Radiology Studies: No results found.       Scheduled Meds:  cetirizine   10 mg Oral QPM   Chlorhexidine  Gluconate Cloth  6 each Topical Daily   enoxaparin  (LOVENOX ) injection  40 mg Subcutaneous Q24H   feeding supplement  237 mL Oral BID BM   insulin  aspart  0-15 Units Subcutaneous TID WC   lidocaine -EPINEPHrine -tetracaine   3 mL Topical Once   pantoprazole   40 mg Oral Daily   rosuvastatin   20 mg Oral Daily   saccharomyces boulardii  250  mg Oral BID   sodium chloride  flush  10-40 mL Intracatheter Q12H   sodium chloride  flush  3-10 mL Intravenous Q12H   Continuous Infusions:   ceFAZolin  (ANCEF ) IV 2 g (06/23/23 0407)     LOS: 5 days     Alphonsus Jeans, MD [facility] Pager [phone]  If 7PM-7AM, please contact night-coverage [email or web address] 06/23/2023, 8:15 AM

## 2023-06-23 NOTE — Plan of Care (Signed)
  Problem: Fluid Volume: Goal: Hemodynamic stability will improve Outcome: Progressing   Problem: Clinical Measurements: Goal: Diagnostic test results will improve Outcome: Progressing Goal: Signs and symptoms of infection will decrease Outcome: Progressing   Problem: Respiratory: Goal: Ability to maintain adequate ventilation will improve Outcome: Progressing   Problem: Education: Goal: Ability to describe self-care measures that may prevent or decrease complications (Diabetes Survival Skills Education) will improve Outcome: Progressing Goal: Individualized Educational Video(s) Outcome: Progressing   Problem: Coping: Goal: Ability to adjust to condition or change in health will improve Outcome: Progressing   Problem: Fluid Volume: Goal: Ability to maintain a balanced intake and output will improve Outcome: Progressing   Problem: Health Behavior/Discharge Planning: Goal: Ability to identify and utilize available resources and services will improve Outcome: Progressing Goal: Ability to manage health-related needs will improve Outcome: Progressing   Problem: Metabolic: Goal: Ability to maintain appropriate glucose levels will improve Outcome: Progressing   Problem: Nutritional: Goal: Maintenance of adequate nutrition will improve Outcome: Progressing Goal: Progress toward achieving an optimal weight will improve Outcome: Progressing   Problem: Skin Integrity: Goal: Risk for impaired skin integrity will decrease Outcome: Progressing   Problem: Tissue Perfusion: Goal: Adequacy of tissue perfusion will improve Outcome: Progressing   Problem: Education: Goal: Knowledge of General Education information will improve Description: Including pain rating scale, medication(s)/side effects and non-pharmacologic comfort measures Outcome: Progressing   Problem: Health Behavior/Discharge Planning: Goal: Ability to manage health-related needs will improve Outcome:  Progressing   Problem: Clinical Measurements: Goal: Ability to maintain clinical measurements within normal limits will improve Outcome: Progressing Goal: Will remain free from infection Outcome: Progressing Goal: Diagnostic test results will improve Outcome: Progressing Goal: Respiratory complications will improve Outcome: Progressing Goal: Cardiovascular complication will be avoided Outcome: Progressing   Problem: Activity: Goal: Risk for activity intolerance will decrease Outcome: Progressing   Problem: Nutrition: Goal: Adequate nutrition will be maintained Outcome: Progressing   Problem: Coping: Goal: Level of anxiety will decrease Outcome: Progressing   Problem: Elimination: Goal: Will not experience complications related to bowel motility Outcome: Progressing Goal: Will not experience complications related to urinary retention Outcome: Progressing   Problem: Pain Managment: Goal: General experience of comfort will improve and/or be controlled Outcome: Progressing   Problem: Safety: Goal: Ability to remain free from injury will improve Outcome: Progressing   Problem: Skin Integrity: Goal: Risk for impaired skin integrity will decrease Outcome: Progressing   Problem: Education: Goal: Knowledge of General Education information will improve Description: Including pain rating scale, medication(s)/side effects and non-pharmacologic comfort measures Outcome: Progressing

## 2023-06-24 ENCOUNTER — Other Ambulatory Visit: Payer: Self-pay

## 2023-06-24 ENCOUNTER — Encounter: Payer: Self-pay | Admitting: Certified Registered"

## 2023-06-24 ENCOUNTER — Encounter
Admission: EM | Disposition: A | Discharge status: Home Health | Payer: Self-pay | Source: Home / Self Care | Attending: Internal Medicine

## 2023-06-24 ENCOUNTER — Inpatient Hospital Stay
Admit: 2023-06-24 | Discharge: 2023-06-24 | Disposition: A | Discharge status: Home / Self Care | Attending: Nurse Practitioner | Admitting: Nurse Practitioner

## 2023-06-24 DIAGNOSIS — R509 Fever, unspecified: Secondary | ICD-10-CM

## 2023-06-24 DIAGNOSIS — I340 Nonrheumatic mitral (valve) insufficiency: Secondary | ICD-10-CM

## 2023-06-24 DIAGNOSIS — R7881 Bacteremia: Secondary | ICD-10-CM

## 2023-06-24 HISTORY — PX: TEE WITHOUT CARDIOVERSION: SHX5443

## 2023-06-24 LAB — BASIC METABOLIC PANEL WITH GFR
Anion gap: 7 (ref 5–15)
BUN: 11 mg/dL (ref 8–23)
CO2: 28 mmol/L (ref 22–32)
Calcium: 8.7 mg/dL — ABNORMAL LOW (ref 8.9–10.3)
Chloride: 104 mmol/L (ref 98–111)
Creatinine, Ser: 0.51 mg/dL (ref 0.44–1.00)
GFR, Estimated: >60 mL/min (ref >60)
Glucose, Bld: 159 mg/dL — ABNORMAL HIGH (ref 70–99)
Potassium: 4 mmol/L (ref 3.5–5.1)
Sodium: 139 mmol/L (ref 135–145)

## 2023-06-24 LAB — CBC WITH DIFFERENTIAL/PLATELET
Abs Immature Granulocytes: 0.13 10*3/uL — ABNORMAL HIGH (ref 0.00–0.07)
Basophils Absolute: 0 10*3/uL (ref 0.0–0.1)
Basophils Relative: 1 %
Eosinophils Absolute: 0.1 10*3/uL (ref 0.0–0.5)
Eosinophils Relative: 2 %
HCT: 32.8 % — ABNORMAL LOW (ref 36.0–46.0)
Hemoglobin: 10.9 g/dL — ABNORMAL LOW (ref 12.0–15.0)
Immature Granulocytes: 3 %
Lymphocytes Relative: 51 %
Lymphs Abs: 2.1 10*3/uL (ref 0.7–4.0)
MCH: 28.8 pg (ref 26.0–34.0)
MCHC: 33.2 g/dL (ref 30.0–36.0)
MCV: 86.5 fL (ref 80.0–100.0)
Monocytes Absolute: 0.5 10*3/uL (ref 0.1–1.0)
Monocytes Relative: 13 %
Neutro Abs: 1.2 10*3/uL — ABNORMAL LOW (ref 1.7–7.7)
Neutrophils Relative %: 30 %
Platelets: 240 10*3/uL (ref 150–400)
RBC: 3.79 MIL/uL — ABNORMAL LOW (ref 3.87–5.11)
RDW: 15.2 % (ref 11.5–15.5)
WBC: 4.1 10*3/uL (ref 4.0–10.5)
nRBC: 0.7 % — ABNORMAL HIGH (ref 0.0–0.2)

## 2023-06-24 LAB — GLUCOSE, CAPILLARY
Glucose-Capillary: 142 mg/dL — ABNORMAL HIGH (ref 70–99)
Glucose-Capillary: 173 mg/dL — ABNORMAL HIGH (ref 70–99)
Glucose-Capillary: 180 mg/dL — ABNORMAL HIGH (ref 70–99)

## 2023-06-24 LAB — ECHO TEE

## 2023-06-24 LAB — CULTURE, BLOOD (ROUTINE X 2)
Culture: NO GROWTH
Culture: NO GROWTH
Special Requests: ADEQUATE

## 2023-06-24 LAB — MAGNESIUM: Magnesium: 2 mg/dL (ref 1.7–2.4)

## 2023-06-24 LAB — PHOSPHORUS: Phosphorus: 3.9 mg/dL (ref 2.5–4.6)

## 2023-06-24 SURGERY — ECHOCARDIOGRAM, TRANSESOPHAGEAL
Anesthesia: Moderate Sedation

## 2023-06-24 MED ORDER — MIDAZOLAM HCL 2 MG/2ML IJ SOLN
INTRAMUSCULAR | Status: AC | PRN
  Administered 2023-06-24: 2 mg via INTRAVENOUS
  Administered 2023-06-24: 1 mg via INTRAVENOUS

## 2023-06-24 MED ORDER — SODIUM CHLORIDE 0.9% FLUSH
10.0000 mL | Freq: Two times a day (BID) | INTRAVENOUS | Status: DC
  Administered 2023-06-24: 10 mL

## 2023-06-24 MED ORDER — MIDAZOLAM HCL 2 MG/2ML IJ SOLN
INTRAMUSCULAR | Status: AC
  Filled 2023-06-24: qty 4

## 2023-06-24 MED ORDER — BUTAMBEN-TETRACAINE-BENZOCAINE 2-2-14 % EX AERO
INHALATION_SPRAY | CUTANEOUS | Status: AC | PRN
  Administered 2023-06-24: 1 via TOPICAL

## 2023-06-24 MED ORDER — SODIUM CHLORIDE 0.9 % IV SOLN
INTRAVENOUS | Status: AC | PRN
  Administered 2023-06-24: 500 mL/h via INTRAVENOUS

## 2023-06-24 MED ORDER — BUTAMBEN-TETRACAINE-BENZOCAINE 2-2-14 % EX AERO
INHALATION_SPRAY | CUTANEOUS | Status: AC
Start: 2023-06-24 — End: 2023-06-24
  Filled 2023-06-24: qty 5

## 2023-06-24 MED ORDER — SODIUM CHLORIDE 0.9% FLUSH: 10.0000 mL | INTRAVENOUS | Status: DC | PRN

## 2023-06-24 MED ORDER — FENTANYL CITRATE (PF) 100 MCG/2ML IJ SOLN
INTRAMUSCULAR | Status: AC | PRN
  Administered 2023-06-24: 25 ug via INTRAVENOUS
  Administered 2023-06-24: 50 ug via INTRAVENOUS

## 2023-06-24 MED ORDER — CEFAZOLIN IV (FOR PTA / DISCHARGE USE ONLY): 2.0000 g | Freq: Three times a day (TID) | INTRAVENOUS | 0 refills | Status: AC

## 2023-06-24 MED ORDER — FENTANYL CITRATE (PF) 100 MCG/2ML IJ SOLN
INTRAMUSCULAR | Status: AC
  Filled 2023-06-24: qty 4

## 2023-06-24 MED ORDER — LIDOCAINE VISCOUS HCL 2 % MT SOLN
OROMUCOSAL | Status: AC | PRN
  Administered 2023-06-24: 15 mL via OROMUCOSAL

## 2023-06-24 MED ORDER — LIDOCAINE VISCOUS HCL 2 % MT SOLN
OROMUCOSAL | Status: AC
Start: 2023-06-24 — End: 2023-06-24
  Filled 2023-06-24: qty 15

## 2023-06-24 NOTE — Progress Notes (Signed)
 PHARMACY CONSULT NOTE FOR:  OUTPATIENT  PARENTERAL ANTIBIOTIC THERAPY (OPAT)  Indication: MSSA bacteremia  Regimen: Cefazolin  2 gm IV Q 8 hours  End date: 07/17/23   IV antibiotic discharge orders are pended. To discharging provider:  please sign these orders via discharge navigator,  Select New Orders & click on the button choice - Manage This Unsigned Work.     Thank you for allowing pharmacy to be a part of this patient's care.  Denson Flake, PharmD, BCPS, BCIDP Infectious Diseases Clinical Pharmacist Phone: [phone] 06/24/2023, 9:14 AM

## 2023-06-24 NOTE — Plan of Care (Signed)
   Problem: Fluid Volume: Goal: Hemodynamic stability will improve Outcome: Progressing   Problem: Clinical Measurements: Goal: Diagnostic test results will improve Outcome: Progressing Goal: Signs and symptoms of infection will decrease Outcome: Progressing   Problem: Respiratory: Goal: Ability to maintain adequate ventilation will improve Outcome: Progressing

## 2023-06-24 NOTE — Discharge Summary (Signed)
 Kathleen Russell NWG:[id number] DOB: 07/05/1955 DOA: 06/17/2023  PCP: Thersia Flax, MD  Admit date: 06/17/2023 Discharge date: 06/24/2023  Time spent: 35 minutes  Recommendations for Outpatient Follow-up:  Pcp f/u ID f/u 3 week (need to call and schedule) Oncology f/u     Discharge Diagnoses:  Principal Problem:   SIRS (systemic inflammatory response syndrome) (HCC) Active Problems:   Dyslipidemia   Type 2 diabetes mellitus without complications (HCC)   Pancreatic cancer (HCC)   Fever   Bacteremia   Discharge Condition: stable  Diet recommendation: heart heatlhy  Filed Weights   06/17/23 2108  Weight: 74.8 kg    History of present illness:  From admission h and p Kathleen Russell is a 68 y.o. Caucasian female with medical history significant for pancreatic cancer on chemotherapy proceeding with surgical intervention, GERD, allergic rhinitis, dyslipidemia and type 2 diabetes mellitus as well as Raynaud's phenomenon who presented to the emergency room with acute onset of fever that was up to 102.7 with associated chills that started around 7:30 PM.  The patient admitted to dysuria without urinary frequency or urgency or hematuria or flank pain.  She admitted to nausea and vomiting since yesterday morning.  No chest pain or palpitations.  No cough or wheezing or dyspnea.  No rhinorrhea or nasal congestion or sore throat or earache.  No bleeding diathesis.   Hospital Course:  Hx pancreatic cancer with port for chemotherapy presents with fever, found to have mssa bacteremia 2/2 that port (cath tip culture positive for mssa) which has been removed, now on 4 weeks cefazolin  through 7/11, picc placed for that and OPAT ordered, will need ID f/u in 3 weeks and also close oncology follow-up. TEE negative for endocarditis. ID advises holding off on chemo until bacteremia is treated. Can use current picc for chemo.  Procedures: Port removal, picc placement    Consultations: ID  Discharge Exam: Vitals:   06/24/23 0839 06/24/23 1606  BP: 110/71 110/65  Pulse: 90 94  Resp: 10 18  Temp:    SpO2: 90% 97%    General: NAD Cardiovascular: RRR Respiratory: CTAB  Discharge Instructions   Discharge Instructions     [facility] pharmacist to adjust dose for Vancomycin , Aminoglycosides and other anti-infective therapies as requested by physician.   Complete by: As directed    [facility] to provide Cath Flo 2mg    Complete by: As directed    Administer for PICC line occlusion and as ordered by physician for other access device issues.   Anaphylaxis Kit: Provided to treat any anaphylactic reaction to the medication being provided to the patient if First Dose or when requested by physician   Complete by: As directed    Epinephrine  1mg /ml vial / amp: Administer 0.3mg  (0.35ml) subcutaneously once for moderate to severe anaphylaxis, nurse to call physician and pharmacy when reaction occurs and call 911 if needed for immediate care   Diphenhydramine 50mg /ml IV vial: Administer 25-50mg  IV/IM PRN for first dose reaction, rash, itching, mild reaction, nurse to call physician and pharmacy when reaction occurs   Sodium Chloride  0.9% NS 500ml IV: Administer if needed for hypovolemic blood pressure drop or as ordered by physician after call to physician with anaphylactic reaction   Change dressing on IV access line weekly and PRN   Complete by: As directed    Diet - low sodium heart healthy   Complete by: As directed    Flush IV access with Sodium Chloride  0.9% and Heparin   10 units/ml or 100 units/ml   Complete by: As directed    Home infusion instructions - [facility]   Complete by: As directed    Instructions: Flush IV access with Sodium Chloride  0.9% and Heparin  10units/ml or 100units/ml   Change dressing on IV access line: Weekly and PRN   Instructions Cath Flo 2mg : Administer for PICC Line occlusion and as ordered  by physician for other access device   [facility] pharmacist to adjust dose for: Vancomycin , Aminoglycosides and other anti-infective therapies as requested by physician   Increase activity slowly   Complete by: As directed    Method of administration may be changed at the discretion of home infusion pharmacist based upon assessment of the patient and/or caregiver's ability to self-administer the medication ordered   Complete by: As directed    No wound care   Complete by: As directed       Allergies as of 06/24/2023       Reactions   Latex Itching, Rash        Medication List     STOP taking these medications    valACYclovir  1000 MG tablet Commonly known as: VALTREX        TAKE these medications    acetaminophen  500 MG tablet Commonly known as: TYLENOL  Take by mouth.   blood glucose meter kit and supplies Dispense based on patient and insurance preference. Use up to four times daily as directed. (FOR ICD-10 E10.9, E11.9).   ceFAZolin  IVPB Commonly known as: ANCEF  Inject 2 g into the vein every 8 (eight) hours for 23 days. Indication:  MSSA Bacteremia  First Dose: Yes Last Day of Therapy:  07/17/23  PICC Care per protocol Labs weekly while on IV antibiotics -FAX weekly labs to [phone]  CBC w diff         Comprehensive met panel Method of administration: IV Push Method of administration may be changed at the discretion of home infusion pharmacist based upon assessment of the patient and/or caregiver's ability to self-administer the medication ordered.   dexamethasone  4 MG tablet Commonly known as: DECADRON  Take 2 tablets (8 mg total) by mouth daily. Take 2 tablets daily x 3 days starting the day after chemotherapy. Take with food.   esomeprazole  40 MG capsule Commonly known as: NEXIUM  TAKE 1 CAPSULE BY MOUTH DAILY BEFORE BREAKFAST   estradiol  0.1 MG/GM vaginal cream Commonly known as: ESTRACE  INSERT 1 APPLICATORFUL VAGINALLY 3 TIMES A WEEK    fluticasone  50 MCG/ACT nasal spray Commonly known as: FLONASE  SHAKE LIQUID AND USE 2 SPRAYS IN EACH NOSTRIL AT BEDTIME AS NEEDED   lidocaine -prilocaine  cream Commonly known as: EMLA  Apply to affected area once   loperamide  2 MG capsule Commonly known as: IMODIUM  Take 2 tabs by mouth with first loose stool, then 1 tab with each additional loose stool as needed. Do not exceed 8 tabs in a 24-hour period   magic mouthwash (multi-ingredient) oral suspension Swish and swallow with 5-10 mLs 4 (four) times daily.   metroNIDAZOLE  1 % gel Commonly known as: METROGEL  Apply topically daily.   ondansetron  8 MG tablet Commonly known as: Zofran  Take 1 tablet (8 mg total) by mouth every 8 (eight) hours as needed for nausea or vomiting. Start on the third day after chemotherapy   prochlorperazine  10 MG tablet Commonly known as: COMPAZINE  Take 1 tablet (10 mg total) by mouth every 6 (six) hours as needed for nausea or vomiting (Nausea or vomiting).   rosuvastatin  20 MG  tablet Commonly known as: CRESTOR  TAKE 1 TABLET(20 MG) BY MOUTH DAILY   sitaGLIPtin  25 MG tablet Commonly known as: Januvia  Take 1 tablet (25 mg total) by mouth daily.   SYSTANE COMPLETE OP Apply to eye.   Ventolin  HFA 108 (90 Base) MCG/ACT inhaler Generic drug: albuterol  Inhale 2 puffs into the lungs every 6 (six) hours as needed.   Voltaren 1 % Gel Generic drug: diclofenac Sodium Apply topically 4 (four) times daily.   Xyzal Allergy 24HR 5 MG tablet Generic drug: levocetirizine               Discharge Care Instructions  (From admission, onward)           Start     Ordered   06/24/23 0000  Change dressing on IV access line weekly and PRN  (Home infusion instructions - [facility] )        06/24/23 1410           Allergies  Allergen Reactions   Latex Itching and Rash    Follow-up Information     Thersia Flax, MD Follow up.   Specialty: Internal Medicine Why: hospital follow  up Contact information: [address] [place] [address] [phone]         Avonne Boettcher, MD Follow up.   Specialty: Oncology Contact information: [address] [place] [address] [phone]         Eartha Gold, MD Follow up.   Specialty: Infectious Diseases Why: 3 weeks Contact information: [address] [place] [address] [phone]                  The results of significant diagnostics from this hospitalization (including imaging, microbiology, ancillary and laboratory) are listed below for reference.    Significant Diagnostic Studies: ECHO TEE Result Date: 06/24/2023    TRANSESOPHOGEAL ECHO REPORT   Patient Name:   Kathleen Russell Date of Exam: 06/24/2023 Medical Rec #:  [id number]        Height:       64.0 in Accession #:    [id number]       Weight:       165.0 lb Date of Birth:  1955-07-17         BSA:          1.803 m Patient Age:    67 years         BP:           96/60 mmHg Patient Gender: F                HR:           85 bpm. Exam Location:  [facility] Procedure: Transesophageal Echo, Cardiac Doppler, Color Doppler and Saline            Contrast Bubble Study (Both Spectral and Color Flow Doppler were            utilized during procedure). Indications:     Not listed on TEE check-in sheet.  History:         Patient has prior history of Echocardiogram examinations, most                  recent 06/19/2023. Risk Factors:Diabetes.  Sonographer:     Broadus Canes Referring Phys:  [id number] BERGE Diagnosing Phys: Belva Boyden MD PROCEDURE: After discussion of the risks and benefits of a TEE, an informed consent was  obtained from the patient. TEE procedure time was 30 minutes. The transesophogeal probe was passed without difficulty through the esophogus of the patient. Imaged were obtained with the patient in a left lateral decubitus position. Local oropharyngeal anesthetic was provided with Cetacaine and viscous  lidocaine . Sedation performed by performing physician. Patients was under conscious sedation during this procedure. Anesthetic administered: 75mcg of Fentanyl , 3mg  of Versed . Image quality was excellent. The patient's vital signs; including heart rate, blood pressure, and oxygen saturation; remained stable throughout the procedure. The patient developed no complications during the procedure.  IMPRESSIONS  1. No valve vegetation noted.  2. Left ventricular ejection fraction, by estimation, is 60 to 65%. The left ventricle has normal function. The left ventricle has no regional wall motion abnormalities.  3. Right ventricular systolic function is normal. The right ventricular size is normal.  4. No left atrial/left atrial appendage thrombus was detected.  5. The mitral valve is normal in structure. Mild mitral valve regurgitation. No evidence of mitral stenosis.  6. The aortic valve is tricuspid. Aortic valve regurgitation is not visualized. No aortic stenosis is present.  7. The inferior vena cava is normal in size with greater than 50% respiratory variability, suggesting right atrial pressure of 3 mmHg.  8. Agitated saline contrast bubble study was negative, with no evidence of any interatrial shunt. Conclusion(s)/Recommendation(s): Normal biventricular function without evidence of hemodynamically significant valvular heart disease. FINDINGS  Left Ventricle: Left ventricular ejection fraction, by estimation, is 60 to 65%. The left ventricle has normal function. The left ventricle has no regional wall motion abnormalities. The left ventricular internal cavity size was normal in size. There is  no left ventricular hypertrophy. Right Ventricle: The right ventricular size is normal. No increase in right ventricular wall thickness. Right ventricular systolic function is normal. Left Atrium: Left atrial size was normal in size. No left atrial/left atrial appendage thrombus was detected. Right Atrium: Right atrial size was  normal in size. Pericardium: There is no evidence of pericardial effusion. Mitral Valve: The mitral valve is normal in structure. Mild mitral valve regurgitation. No evidence of mitral valve stenosis. There is no evidence of mitral valve vegetation. Tricuspid Valve: The tricuspid valve is normal in structure. Tricuspid valve regurgitation is not demonstrated. No evidence of tricuspid stenosis. There is no evidence of tricuspid valve vegetation. Aortic Valve: The aortic valve is tricuspid. Aortic valve regurgitation is not visualized. No aortic stenosis is present. There is no evidence of aortic valve vegetation. Pulmonic Valve: The pulmonic valve was normal in structure. Pulmonic valve regurgitation is not visualized. No evidence of pulmonic stenosis. There is no evidence of pulmonic valve vegetation. Aorta: The aortic root is normal in size and structure. Venous: The inferior vena cava is normal in size with greater than 50% respiratory variability, suggesting right atrial pressure of 3 mmHg. IAS/Shunts: No atrial level shunt detected by color flow Doppler. Agitated saline contrast was given intravenously to evaluate for intracardiac shunting. Agitated saline contrast bubble study was negative, with no evidence of any interatrial shunt. There  is no evidence of a patent foramen ovale. There is no evidence of an atrial septal defect. Additional Comments: 3D was performed not requiring image post processing on an independent workstation and was indeterminate. Belva Boyden MD Electronically signed by Belva Boyden MD Signature Date/Time: 06/24/2023/3:28:14 PM    Final    US  EKG SITE RITE Result Date: 06/24/2023 If Site Rite image not attached, placement could not be confirmed due to current cardiac rhythm.  ECHOCARDIOGRAM COMPLETE BUBBLE STUDY Result Date: 06/19/2023    ECHOCARDIOGRAM REPORT   Patient Name:   Kathleen Russell Date of Exam: 06/19/2023 Medical Rec #:  [id number]        Height:       64.0 in  Accession #:    [id number]       Weight:       165.0 lb Date of Birth:  [birth year]         BSA:          1.803 m Patient Age:    67 years         BP:           119/78 mmHg Patient Gender: F                HR:           121 bpm. Exam Location:  [facility] Procedure: 2D Echo, Cardiac Doppler, Color Doppler and Saline Contrast Bubble            Study (Both Spectral and Color Flow Doppler were utilized during            procedure). Indications:     Endocarditis  History:         Patient has no prior history of Echocardiogram examinations.                  Risk Factors:Diabetes.  Sonographer:     Broadus Canes Referring Phys:  [id number] [facility] Diagnosing Phys: Sammy Crisp MD IMPRESSIONS  1. Left ventricular ejection fraction, by estimation, is 60 to 65%. The left ventricle has normal function. The left ventricle has no regional wall motion abnormalities. There is mild left ventricular hypertrophy. Indeterminate diastolic filling due to E-A fusion.  2. Right ventricular systolic function is normal. The right ventricular size is normal. Tricuspid regurgitation signal is inadequate for assessing PA pressure.  3. The mitral valve is normal in structure. No evidence of mitral valve regurgitation. No evidence of mitral stenosis.  4. The aortic valve has an indeterminant number of cusps. Aortic valve regurgitation is not visualized. No aortic stenosis is present.  5. Agitated saline contrast bubble study was negative, with no evidence of any interatrial shunt. FINDINGS  Left Ventricle: Left ventricular ejection fraction, by estimation, is 60 to 65%. The left ventricle has normal function. The left ventricle has no regional wall motion abnormalities. The left ventricular internal cavity size was normal in size. There is  mild left ventricular hypertrophy. Indeterminate diastolic filling due to E-A fusion. Right Ventricle: The right ventricular size is normal. No increase in right ventricular wall thickness. Right ventricular  systolic function is normal. Tricuspid regurgitation signal is inadequate for assessing PA pressure. Left Atrium: Left atrial size was normal in size. Right Atrium: Right atrial size was normal in size. Pericardium: The pericardium was not well visualized. Mitral Valve: The mitral valve is normal in structure. No evidence of mitral valve regurgitation. No evidence of mitral valve stenosis. Tricuspid Valve: The tricuspid valve is not well visualized. Tricuspid valve regurgitation is trivial. Aortic Valve: The aortic valve has an indeterminant number of cusps. Aortic valve regurgitation is not visualized. No aortic stenosis is present. Aortic valve mean gradient measures 2.0 mmHg. Aortic valve peak gradient measures 2.9 mmHg. Aortic valve area, by VTI measures 4.14 cm. Pulmonic Valve: The pulmonic valve was not well visualized. Pulmonic valve regurgitation is not visualized. No evidence of pulmonic stenosis. Aorta: The aortic root is normal in size  and structure. Pulmonary Artery: The pulmonary artery is not well seen. Venous: The inferior vena cava was not well visualized. IAS/Shunts: The interatrial septum was not well visualized. Agitated saline contrast was given intravenously to evaluate for intracardiac shunting. Agitated saline contrast bubble study was negative, with no evidence of any interatrial shunt.  LEFT VENTRICLE PLAX 2D LVIDd:         3.40 cm   Diastology LVIDs:         2.30 cm   LV e' medial:   5.98 cm/s LV PW:         1.00 cm   LV E/e' medial: 11.5 LV IVS:        1.20 cm LVOT diam:     2.00 cm LV SV:         47 LV SV Index:   26 LVOT Area:     3.14 cm  RIGHT VENTRICLE RV Basal diam:  3.20 cm RV Mid diam:    3.10 cm LEFT ATRIUM           Index        RIGHT ATRIUM           Index LA diam:      2.30 cm 1.28 cm/m   RA Area:     17.40 cm LA Vol (A2C): 20.0 ml 11.09 ml/m  RA Volume:   42.80 ml  23.74 ml/m LA Vol (A4C): 22.4 ml 12.43 ml/m  AORTIC VALVE AV Area (Vmax):    2.87 cm AV Area (Vmean):    3.05 cm AV Area (VTI):     4.14 cm AV Vmax:           85.65 cm/s AV Vmean:          60.700 cm/s AV VTI:            0.113 m AV Peak Grad:      2.9 mmHg AV Mean Grad:      2.0 mmHg LVOT Vmax:         78.20 cm/s LVOT Vmean:        59.000 cm/s LVOT VTI:          0.149 m LVOT/AV VTI ratio: 1.32  AORTA Ao Root diam: 3.50 cm MITRAL VALVE MV Area (PHT): 6.77 cm     SHUNTS MV Decel Time: 112 msec     Systemic VTI:  0.15 m MV E velocity: 68.70 cm/s   Systemic Diam: 2.00 cm MV A velocity: 124.00 cm/s MV E/A ratio:  0.55 Veryl Gottron End MD Electronically signed by Sammy Crisp MD Signature Date/Time: 06/19/2023/4:47:04 PM    Final    IR REMOVAL TUN ACCESS W/ PORT W/O FL MOD SED Result Date: 06/19/2023 INDICATION: MSSA bacteremia.  Port removal with tip culturing. EXAM: REMOVAL RIGHT IJ VEIN PORT-A-CATH MEDICATIONS: ; The antibiotic was administered within an appropriate time interval prior to skin puncture. ANESTHESIA/SEDATION: Moderate (conscious) sedation was employed during this procedure. A total of Versed  0 mg and Fentanyl  0 mcg was administered intravenously by the radiology nurse. Total intra-service moderate Sedation Time: 0 minutes. The patient's level of consciousness and vital signs were monitored continuously by radiology nursing throughout the procedure under my direct supervision. FLUOROSCOPY: Radiation Exposure Index (as provided by the fluoroscopic device): 0 mGy Kerma COMPLICATIONS: None immediate. PROCEDURE: Informed written consent was obtained from the patient after a thorough discussion of the procedural risks, benefits and alternatives. All questions were addressed. Maximal Sterile Barrier Technique was utilized including caps, mask, sterile  gowns, sterile gloves, sterile drape, hand hygiene and skin antiseptic. A timeout was performed prior to the initiation of the procedure. The right chest was prepped and draped in a sterile fashion. Lidocaine  was utilized for local anesthesia. An incision was  made over the previously healed surgical incision. Utilizing blunt dissection, the port catheter and reservoir were removed from the underlying subcutaneous tissue in their entirety. Securing sutures were also removed. The pocket was irrigated with a copious amount of sterile normal saline. The pocket was closed with interrupted 3-0 Vicryl stitches. The subcutaneous tissue was closed with 3-0 Vicryl interrupted subcutaneous stitches. A 4-0 Vicryl running subcuticular stitch was utilized to approximate the skin. Dermabond was applied. IMPRESSION: Successful right IJ vein Port-A-Cath explant. The tip of the catheter was cut and sent to laboratory for culture and sensitivity. Electronically Signed   By: Susan Ensign   On: 06/19/2023 10:11   DG Chest 2 View Result Date: 06/17/2023 CLINICAL DATA:  Fever EXAM: CHEST - 2 VIEW COMPARISON:  02/17/2020 FINDINGS: Right-sided central venous catheter tip at the cavoatrial junction. No acute airspace disease, pleural effusion, or pneumothorax. Stable cardiomediastinal silhouette IMPRESSION: No active cardiopulmonary disease. Electronically Signed   By: Esmeralda Hedge M.D.   On: 06/17/2023 21:40   MM 3D SCREENING MAMMOGRAM BILATERAL BREAST Result Date: 06/04/2023 CLINICAL DATA:  Screening. Recent diagnosis pancreatic cancer. EXAM: DIGITAL SCREENING BILATERAL MAMMOGRAM WITH TOMOSYNTHESIS AND CAD TECHNIQUE: Bilateral screening digital craniocaudal and mediolateral oblique mammograms were obtained. Bilateral screening digital breast tomosynthesis was performed. The images were evaluated with computer-aided detection. COMPARISON:  Previous exam(s). ACR Breast Density Category b: There are scattered areas of fibroglandular density. FINDINGS: There are no findings suspicious for malignancy. Port-A-Cath port overlies the RIGHT axilla on the MLO view. IMPRESSION: No mammographic evidence of malignancy. A result letter of this screening mammogram will be mailed directly to the  patient. RECOMMENDATION: Screening mammogram in one year. (Code:SM-B-01Y) BI-RADS CATEGORY  1: Negative. Electronically Signed   By: Rinda Cheers M.D.   On: 06/04/2023 09:55    Microbiology: Recent Results (from the past 240 hours)  Culture, blood (Routine x 2)     Status: Abnormal   Collection Time: 06/17/23 10:12 PM   Specimen: Right Antecubital; Blood  Result Value Ref Range Status   Specimen Description   Final    RIGHT ANTECUBITAL Performed at [facility], [address] [place], [place] [address]    Special Requests   Final    BOTTLES DRAWN AEROBIC AND ANAEROBIC Blood Culture adequate volume Performed at [facility], [address] [place], [place] [address]    Culture  Setup Time   Final    GRAM POSITIVE COCCI IN BOTH AEROBIC AND ANAEROBIC BOTTLES CRITICAL VALUE NOTED.  VALUE IS CONSISTENT WITH PREVIOUSLY REPORTED AND CALLED VALUE. Performed at [facility], [address] [place], [place] [address]    Culture (A)  Final    STAPHYLOCOCCUS AUREUS SUSCEPTIBILITIES PERFORMED ON PREVIOUS CULTURE WITHIN THE LAST 5 DAYS. Performed at [facility], [address], [place], [place] [address]    Report Status 06/20/2023 FINAL  Final  Resp panel by RT-PCR (RSV, Flu A&B, Covid) Anterior Nasal Swab     Status: None   Collection Time: 06/17/23 10:12 PM   Specimen: Anterior Nasal Swab  Result Value Ref Range Status   SARS Coronavirus 2 by RT PCR NEGATIVE NEGATIVE Final    Comment: (NOTE) SARS-CoV-2 target nucleic acids are NOT DETECTED.  The  SARS-CoV-2 RNA is generally detectable in upper respiratory specimens during the acute phase of infection. The lowest concentration of SARS-CoV-2 viral copies this assay can detect is 138 copies/mL. A negative result does not preclude SARS-Cov-2 infection and should not be used as the sole basis for treatment or other patient management decisions. A negative result may occur with  improper specimen  collection/handling, submission of specimen other than nasopharyngeal swab, presence of viral mutation(s) within the areas targeted by this assay, and inadequate number of viral copies(<138 copies/mL). A negative result must be combined with clinical observations, patient history, and epidemiological information. The expected result is Negative.  Fact Sheet for Patients:  BloggerCourse.com  Fact Sheet for Healthcare Providers:  SeriousBroker.it  This test is no t yet approved or cleared by the [place]  FDA and  has been authorized for detection and/or diagnosis of SARS-CoV-2 by FDA under an Emergency Use Authorization (EUA). This EUA will remain  in effect (meaning this test can be used) for the duration of the COVID-19 declaration under Section 564(b)(1) of the Act, 21 U.S.C.section 360bbb-3(b)(1), unless the authorization is terminated  or revoked sooner.       Influenza A by PCR NEGATIVE NEGATIVE Final   Influenza B by PCR NEGATIVE NEGATIVE Final    Comment: (NOTE) The Xpert Xpress SARS-CoV-2/FLU/RSV plus assay is intended as an aid in the diagnosis of influenza from Nasopharyngeal swab specimens and should not be used as a sole basis for treatment. Nasal washings and aspirates are unacceptable for Xpert Xpress SARS-CoV-2/FLU/RSV testing.  Fact Sheet for Patients: BloggerCourse.com  Fact Sheet for Healthcare Providers: SeriousBroker.it  This test is not yet approved or cleared by the [place]  FDA and has been authorized for detection and/or diagnosis of SARS-CoV-2 by FDA under an Emergency Use Authorization (EUA). This EUA will remain in effect (meaning this test can be used) for the duration of the COVID-19 declaration under Section 564(b)(1) of the Act, 21 U.S.C. section 360bbb-3(b)(1), unless the authorization is terminated or revoked.     Resp Syncytial  Virus by PCR NEGATIVE NEGATIVE Final    Comment: (NOTE) Fact Sheet for Patients: BloggerCourse.com  Fact Sheet for Healthcare Providers: SeriousBroker.it  This test is not yet approved or cleared by the [place]  FDA and has been authorized for detection and/or diagnosis of SARS-CoV-2 by FDA under an Emergency Use Authorization (EUA). This EUA will remain in effect (meaning this test can be used) for the duration of the COVID-19 declaration under Section 564(b)(1) of the Act, 21 U.S.C. section 360bbb-3(b)(1), unless the authorization is terminated or revoked.  Performed at [facility], [address] [place], [place] [address]   Culture, blood (Routine x 2)     Status: Abnormal   Collection Time: 06/17/23 10:17 PM   Specimen: BLOOD RIGHT HAND  Result Value Ref Range Status   Specimen Description   Final    BLOOD RIGHT HAND Performed at [facility], [address] [place], [place] [address]    Special Requests   Final    BOTTLES DRAWN AEROBIC AND ANAEROBIC Blood Culture results may not be optimal due to an inadequate volume of blood received in culture bottles Performed at [facility], [address], [place], [place] [address]    Culture  Setup Time   Final    GRAM POSITIVE COCCI IN BOTH AEROBIC AND ANAEROBIC BOTTLES CRITICAL RESULT CALLED TO, READ BACK BY AND VERIFIED WITH: EMILY STEINBOCK 06/18/23 1414 MW Performed at [facility], [address] [place], [place] [address]    Culture STAPHYLOCOCCUS AUREUS (A)  Final   Report Status 06/20/2023 FINAL  Final   Organism ID, Bacteria STAPHYLOCOCCUS AUREUS  Final      Susceptibility   Staphylococcus aureus - MIC*    CIPROFLOXACIN  <=0.5 SENSITIVE Sensitive     ERYTHROMYCIN >=8 RESISTANT Resistant     GENTAMICIN <=0.5 SENSITIVE Sensitive     OXACILLIN 0.5 SENSITIVE Sensitive     TETRACYCLINE <=1 SENSITIVE Sensitive     VANCOMYCIN  1  SENSITIVE Sensitive     TRIMETH/SULFA <=10 SENSITIVE Sensitive     CLINDAMYCIN <=0.25 SENSITIVE Sensitive     RIFAMPIN <=0.5 SENSITIVE Sensitive     Inducible Clindamycin NEGATIVE Sensitive     LINEZOLID 2 SENSITIVE Sensitive     * STAPHYLOCOCCUS AUREUS  Blood Culture ID Panel (Reflexed)     Status: Abnormal   Collection Time: 06/17/23 10:17 PM  Result Value Ref Range Status   Enterococcus faecalis NOT DETECTED NOT DETECTED Final   Enterococcus Faecium NOT DETECTED NOT DETECTED Final   Listeria monocytogenes NOT DETECTED NOT DETECTED Final   Staphylococcus species DETECTED (A) NOT DETECTED Final    Comment: CRITICAL RESULT CALLED TO, READ BACK BY AND VERIFIED WITH: EMILY STEINBOCK 06/18/23 1414 MW    Staphylococcus aureus (BCID) DETECTED (A) NOT DETECTED Final    Comment: CRITICAL RESULT CALLED TO, READ BACK BY AND VERIFIED WITH: EMILY STEINBOCK 06/18/23 1414 MW    Staphylococcus epidermidis NOT DETECTED NOT DETECTED Final   Staphylococcus lugdunensis NOT DETECTED NOT DETECTED Final   Streptococcus species NOT DETECTED NOT DETECTED Final   Streptococcus agalactiae NOT DETECTED NOT DETECTED Final   Streptococcus pneumoniae NOT DETECTED NOT DETECTED Final   Streptococcus pyogenes NOT DETECTED NOT DETECTED Final   A.calcoaceticus-baumannii NOT DETECTED NOT DETECTED Final   Bacteroides fragilis NOT DETECTED NOT DETECTED Final   Enterobacterales NOT DETECTED NOT DETECTED Final   Enterobacter cloacae complex NOT DETECTED NOT DETECTED Final   Escherichia coli NOT DETECTED NOT DETECTED Final   Klebsiella aerogenes NOT DETECTED NOT DETECTED Final   Klebsiella oxytoca NOT DETECTED NOT DETECTED Final   Klebsiella pneumoniae NOT DETECTED NOT DETECTED Final   Proteus species NOT DETECTED NOT DETECTED Final   Salmonella species NOT DETECTED NOT DETECTED Final   Serratia marcescens NOT DETECTED NOT DETECTED Final   Haemophilus influenzae NOT DETECTED NOT DETECTED Final   Neisseria meningitidis  NOT DETECTED NOT DETECTED Final   Pseudomonas aeruginosa NOT DETECTED NOT DETECTED Final   Stenotrophomonas maltophilia NOT DETECTED NOT DETECTED Final   Candida albicans NOT DETECTED NOT DETECTED Final   Candida auris NOT DETECTED NOT DETECTED Final   Candida glabrata NOT DETECTED NOT DETECTED Final   Candida krusei NOT DETECTED NOT DETECTED Final   Candida parapsilosis NOT DETECTED NOT DETECTED Final   Candida tropicalis NOT DETECTED NOT DETECTED Final   Cryptococcus neoformans/gattii NOT DETECTED NOT DETECTED Final   Meth resistant mecA/C and MREJ NOT DETECTED NOT DETECTED Final    Comment: Performed at [facility], [address], [place], [place] [address]  Urine Culture     Status: None   Collection Time: 06/17/23 11:46 PM   Specimen: Urine, Random  Result Value Ref Range Status   Specimen Description   Final    URINE, RANDOM Performed at [facility], [address] [place], [place] [address]    Special Requests   Final    NONE Performed at [facility], [address], [place], [place] [address]    Culture   Final    NO GROWTH Performed at [facility], [address] [place], [place] [address]    Report Status 06/19/2023 FINAL  Final  Culture, blood (Routine X 2) w Reflex to ID Panel     Status: None   Collection Time: 06/18/23 11:57 PM   Specimen: BLOOD  Result Value Ref Range Status   Specimen Description BLOOD BLOOD LEFT ARM  Final   Special Requests   Final    BOTTLES DRAWN AEROBIC AND ANAEROBIC Blood Culture adequate volume   Culture   Final    NO GROWTH 5 DAYS Performed at [facility], [address] [place], [place] [address]    Report Status 06/24/2023 FINAL  Final  Culture, blood (Routine X 2) w Reflex to ID Panel     Status: None   Collection Time: 06/19/23 12:18 AM   Specimen: BLOOD  Result Value Ref Range Status   Specimen Description BLOOD BLOOD LEFT ARM  Final   Special Requests   Final    BOTTLES  DRAWN AEROBIC AND ANAEROBIC Blood Culture results may not be optimal due to an inadequate volume of blood received in culture bottles   Culture   Final    NO GROWTH 5 DAYS Performed at [facility], [address] [place], [place] [address]    Report Status 06/24/2023 FINAL  Final  C Difficile Quick Screen w PCR reflex     Status: None   Collection Time: 06/19/23  9:40 AM   Specimen: Catheter Tip; Stool  Result Value Ref Range Status   C Diff antigen NEGATIVE NEGATIVE Final   C Diff toxin NEGATIVE NEGATIVE Final   C Diff interpretation No C. difficile detected.  Final    Comment: Performed at [facility], [address] [place], [place] [address]  Gastrointestinal Panel by PCR , Stool     Status: Abnormal   Collection Time: 06/19/23  9:40 AM   Specimen: Catheter Tip; Stool  Result Value Ref Range Status   Campylobacter species NOT DETECTED NOT DETECTED Final   Plesimonas shigelloides NOT DETECTED NOT DETECTED Final   Salmonella species NOT DETECTED NOT DETECTED Final   Yersinia enterocolitica NOT DETECTED NOT DETECTED Final   Vibrio species NOT DETECTED NOT DETECTED Final   Vibrio cholerae NOT DETECTED NOT DETECTED Final   Enteroaggregative E coli (EAEC) NOT DETECTED NOT DETECTED Final   Enteropathogenic E coli (EPEC) NOT DETECTED NOT DETECTED Final   Enterotoxigenic E coli (ETEC) NOT DETECTED NOT DETECTED Final   Shiga like toxin producing E coli (STEC) NOT DETECTED NOT DETECTED Final   Shigella/Enteroinvasive E coli (EIEC) NOT DETECTED NOT DETECTED Final   Cryptosporidium NOT DETECTED NOT DETECTED Final   Cyclospora cayetanensis NOT DETECTED NOT DETECTED Final   Entamoeba histolytica NOT DETECTED NOT DETECTED Final   Giardia lamblia NOT DETECTED NOT DETECTED Final   Adenovirus F40/41 NOT DETECTED NOT DETECTED Final   Astrovirus NOT DETECTED NOT DETECTED Final   Norovirus GI/GII DETECTED (A) NOT DETECTED Final   Rotavirus A NOT DETECTED NOT DETECTED Final     Comment: RESULT CALLED TO, READ BACK BY AND VERIFIED WITH: Deyanne Forester, RN 1202 06/19/23 GM    Sapovirus (I, II, IV, and V) NOT DETECTED NOT DETECTED Final    Comment: Performed at [facility], [address] [place], [place] [address]  Cath Tip Culture  Status: Abnormal   Collection Time: 06/19/23 10:09 AM   Specimen: Catheter Tip  Result Value Ref Range Status   Specimen Description CATH TIP  Final   Special Requests   Final    NONE Performed at [facility], [address] [place], [place] [address]    Culture 100 COLONIES/mL STAPHYLOCOCCUS AUREUS (A)  Final   Report Status 06/23/2023 FINAL  Final   Organism ID, Bacteria STAPHYLOCOCCUS AUREUS (A)  Final      Susceptibility   Staphylococcus aureus - MIC*    CIPROFLOXACIN  <=0.5 SENSITIVE Sensitive     ERYTHROMYCIN >=8 RESISTANT Resistant     GENTAMICIN <=0.5 SENSITIVE Sensitive     OXACILLIN <=0.25 SENSITIVE Sensitive     TETRACYCLINE <=1 SENSITIVE Sensitive     VANCOMYCIN  <=0.5 SENSITIVE Sensitive     TRIMETH/SULFA <=10 SENSITIVE Sensitive     CLINDAMYCIN <=0.25 SENSITIVE Sensitive     RIFAMPIN <=0.5 SENSITIVE Sensitive     Inducible Clindamycin NEGATIVE Sensitive     LINEZOLID 2 SENSITIVE Sensitive     * 100 COLONIES/mL STAPHYLOCOCCUS AUREUS  Culture, blood (Routine X 2) w Reflex to ID Panel     Status: None (Preliminary result)   Collection Time: 06/20/23  5:14 AM   Specimen: BLOOD  Result Value Ref Range Status   Specimen Description BLOOD BLOOD RIGHT HAND  Final   Special Requests   Final    BOTTLES DRAWN AEROBIC AND ANAEROBIC Blood Culture adequate volume   Culture   Final    NO GROWTH 4 DAYS Performed at [facility], [address] [place], [place] [address]    Report Status PENDING  Incomplete  Culture, blood (Routine X 2) w Reflex to ID Panel     Status: None (Preliminary result)   Collection Time: 06/20/23  5:14 AM   Specimen: BLOOD  Result Value Ref Range Status    Specimen Description BLOOD BLOOD LEFT ARM  Final   Special Requests   Final    BOTTLES DRAWN AEROBIC AND ANAEROBIC Blood Culture adequate volume   Culture   Final    NO GROWTH 4 DAYS Performed at [facility], [address] [place], [place] [address]    Report Status PENDING  Incomplete     Labs: Basic Metabolic Panel: Recent Labs  Lab 06/18/23 2357 06/19/23 0602 06/20/23 0515 06/21/23 6301 06/22/23 0219 06/23/23 0542 06/24/23 0614  NA 132* 133* 139 137 139 140 139  K 3.3* 3.5 3.2* 3.7 3.8 3.1* 4.0  CL 102 101 105 104 108 105 104  CO2 22 23 25 26 24 28 28   GLUCOSE 122* 131* 161* 168* 197* 199* 159*  BUN 7* 6* 7* 10 9 9 11   CREATININE 0.48 0.67 0.53 0.54 0.47 0.55 0.51  CALCIUM  7.6* 7.6* 7.9* 8.2* 8.4* 8.3* 8.7*  MG 1.9 1.8 2.1 2.1  --   --  2.0  PHOS 2.8 3.0 2.8 2.7  --   --  3.9   Liver Function Tests: Recent Labs  Lab 06/17/23 2227  AST 14*  ALT 12  ALKPHOS 56  BILITOT 0.5  PROT 5.9*  ALBUMIN 3.0*   No results for input(s): LIPASE, AMYLASE in the last 168 hours. No results for input(s): AMMONIA in the last 168 hours. CBC: Recent Labs  Lab 06/20/23 0515 06/21/23 0634 06/22/23 0219 06/23/23 0542 06/24/23 0614  WBC 1.7* 2.1* 2.2* 3.8* 4.1  NEUTROABS 0.5* 0.5* 0.8* 1.1* 1.2*  HGB 10.5* 10.4* 9.6* 10.1* 10.9*  HCT 31.5* 30.4* 30.0* 30.2* 32.8*  MCV 85.8 84.4 85.5 85.8 86.5  PLT 126* 152 175 200 240   Cardiac Enzymes: No results for input(s): CKTOTAL, CKMB, CKMBINDEX, TROPONINI in the last 168 hours. BNP: BNP (last 3 results) No results for input(s): BNP in the last 8760 hours.  ProBNP (last 3 results) No results for input(s): PROBNP in the last 8760 hours.  CBG: Recent Labs  Lab 06/23/23 1713 06/23/23 2021 06/24/23 0751 06/24/23 1253 06/24/23 1606  GLUCAP 117* 126* 142* 173* 180*       Signed:  Raymonde Calico MD.  [facility] 06/24/2023, 5:50 PM

## 2023-06-24 NOTE — TOC Progression Note (Signed)
 Transition of Care [facility]) - Progression Note    Patient Details  Name: Kathleen Russell MRN: [id number] Date of Birth: [birth year]  Transition of Care [facility]) CM/SW Contact  Zoe Hinds, RN Phone Number: 06/24/2023, 10:17 AM  Clinical Narrative:    This CM called and spoke with [facility] @ [facility] confirmed [facility] for Skilled Nursing . This CM spoke with Pam @ [facility] infusion secured when pt is medically cleared to dc by team. TOC will cont to follow dc planning / care coordination .    Expected Discharge Plan: Home w Home Health Services Barriers to Discharge: No Barriers Identified  Expected Discharge Plan and Services   Discharge Planning Services: NA Post Acute Care Choice: Home Health Living arrangements for the past 2 months: Single Family Home                           HH Arranged: RN, IV Antibiotics HH Agency: [facility] Date [facility] Agency Contacted: 06/24/23 Time HH Agency Contacted: [phone] Representative spoke with at [facility] Agency: [facility] [place] & Pam @ [facility]fusion company   Social Determinants of Health (SDOH) Interventions SDOH Screenings   Food Insecurity: No Food Insecurity (06/18/2023)  Housing: Unknown (06/18/2023)  Transportation Needs: No Transportation Needs (06/18/2023)  Utilities: Not At Risk (06/18/2023)  Alcohol Screen: Low Risk  (03/06/2023)  Depression (PHQ2-9): Low Risk  (04/21/2023)  Financial Resource Strain: Low Risk  (03/06/2023)  Physical Activity: Insufficiently Active (03/06/2023)  Social Connections: Socially Integrated (06/18/2023)  Stress: No Stress Concern Present (03/06/2023)  Tobacco Use: Low Risk  (06/18/2023)    Readmission Risk Interventions     No data to display

## 2023-06-24 NOTE — Progress Notes (Signed)
 Peripherally Inserted Central Catheter Placement  The IV Nurse has discussed with the patient and/or persons authorized to consent for the patient, the purpose of this procedure and the potential benefits and risks involved with this procedure.  The benefits include less needle sticks, lab draws from the catheter, and the patient may be discharged home with the catheter. Risks include, but not limited to, infection, bleeding, blood clot (thrombus formation), and puncture of an artery; nerve damage and irregular heartbeat and possibility to perform a PICC exchange if needed/ordered by physician.  Alternatives to this procedure were also discussed.  Bard Power PICC patient education guide, fact sheet on infection prevention and patient information card has been provided to patient /or left at bedside.    PICC Placement Documentation  PICC Double Lumen 06/24/23 Right Brachial 37 cm 0 cm (Active)  Indication for Insertion or Continuance of Line Prolonged intravenous therapies 06/24/23 1220  Exposed Catheter (cm) 0 cm 06/24/23 1220  Site Assessment Clean, Dry, Intact 06/24/23 1220  Lumen #1 Status Flushed;Blood return noted;Saline locked 06/24/23 1220  Lumen #2 Status Flushed;Blood return noted;Saline locked 06/24/23 1220  Dressing Type Transparent 06/24/23 1220  Dressing Status Antimicrobial disc/dressing in place 06/24/23 1220  Line Care Connections checked and tightened 06/24/23 1220  Line Adjustment (NICU/IV Team Only) No 06/24/23 1220  Dressing Intervention New dressing 06/24/23 1220  Dressing Change Due 07/01/23 06/24/23 1220       Louvella Royalty 06/24/2023, 12:38 PM

## 2023-06-24 NOTE — Progress Notes (Signed)
*  PRELIMINARY RESULTS* Echocardiogram Echocardiogram Transesophageal has been performed.  Broadus Canes 06/24/2023, 8:37 AM

## 2023-06-24 NOTE — TOC Initial Note (Signed)
 Transition of Care [facility]) - Initial/Assessment Note    Patient Details  Name: Kathleen Russell MRN: [id number] Date of Birth: 1955/11/12  Transition of Care [facility]) CM/SW Contact:    Zoe Hinds, RN Phone Number: 06/24/2023, 10:08 AM  Clinical Narrative:                 This CM spoke with pt  introduced role & completed Initial assessement. Pt A&Ox4Pt reports living in a  single family ranch style home with 4 steps to enter. Pt has family support provided by husband, son, and church family . Pt uses [facility] pharmacy located at  Address: [address] [place], [place] [address] ,PCP is Dr Laverna Pott located Address: [address], [place], [place] [address] Phone: [phone] . Pt denied using DME prior to this admission. Pt reports not  being in SNF prior to  this admission. Pt denies having  HH services coordinated prior to this admission. Pt denies having any community resources in place. TOC will cont to follow pt during hospital stay and update as applicable.   Expected Discharge Plan: Home w Home Health Services Barriers to Discharge: No Barriers Identified   Patient Goals and CMS Choice Patient states their goals for this hospitalization and ongoing recovery are:: To DC back home with family   Choice offered to / list presented to : Patient      Expected Discharge Plan and Services   Discharge Planning Services: NA Post Acute Care Choice: Home Health Living arrangements for the past 2 months: Single Family Home                           HH Arranged: RN, IV Antibiotics HH Agency: [facility] Date [facility] Agency Contacted: 06/24/23 Time HH Agency Contacted: 0910 Representative spoke with at [facility] Agency: Randel Buss @ [place] & Pam @ [facility] company  Prior Living Arrangements/Services Living arrangements for the past 2 months: Single Family Home Lives with:: Spouse   Do you feel safe going back to the place where you live?: Yes      Need for Family  Participation in Patient Care: Yes (Comment) Care giver support system in place?: Yes (comment) Current home services: Other (comment) (TBD) Criminal Activity/Legal Involvement Pertinent to Current Situation/Hospitalization: No - Comment as needed  Activities of Daily Living   ADL Screening (condition at time of admission) Independently performs ADLs?: Yes (appropriate for developmental age) Is the patient deaf or have difficulty hearing?: No Does the patient have difficulty seeing, even when wearing glasses/contacts?: No Does the patient have difficulty concentrating, remembering, or making decisions?: No  Permission Sought/Granted Permission sought to share information with : Case Manager                Emotional Assessment   Attitude/Demeanor/Rapport: Engaged Affect (typically observed): Accepting, Calm, Pleasant Orientation: : Oriented to Self, Oriented to Place, Oriented to  Time, Oriented to Situation Alcohol / Substance Use: Not Applicable Psych Involvement: No (comment)  Admission diagnosis:  Dysuria [R30.0] SIRS (systemic inflammatory response syndrome) (HCC) [R65.10] Fever, unspecified fever cause [R50.9] Patient Active Problem List   Diagnosis Date Noted   Fever 06/24/2023   Bacteremia 06/24/2023   SIRS (systemic inflammatory response syndrome) (HCC) 06/18/2023   Dyslipidemia 06/18/2023   Type 2 diabetes mellitus without complications (HCC) 06/18/2023   Pancreatic cancer (HCC) 06/18/2023   Goals of care, counseling/discussion 05/04/2023   Malignant neoplasm of tail of  pancreas (HCC) 05/01/2023   Pancreatic mass 04/14/2023   RUQ pain 03/11/2023   Suprapubic pain 03/11/2023   Acute pain of right knee 11/15/2022   Type 2 diabetes mellitus with obesity (HCC) 06/10/2021   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   Wears hearing aid 03/22/2021   Cataracts, both eyes 03/22/2021   Atypical chest pain 03/23/2020   Exertional dyspnea 03/23/2020   Abnormal  electrocardiogram 03/18/2020   History of COVID-19 03/16/2020   History of influenza 03/16/2020   Seasonal allergies 05/22/2019   S/P laparoscopic cholecystectomy 02/24/2019   Paresthesias 02/24/2019   Encounter for general adult medical examination with abnormal findings 10/26/2018   Raynaud phenomenon 02/07/2017   Carpal tunnel syndrome on both sides 02/07/2017   GERD with esophagitis 02/07/2017   Pain in both wrists 06/06/2016   Screening for cervical cancer 02/02/2015   Diverticulosis of colon without hemorrhage 06/02/2014   Postmenopause atrophic vaginitis 02/01/2014   Hyperlipidemia LDL goal <70 11/07/2012   Welcome to Medicare preventive visit 10/31/2011   Obesity (BMI 30-39.9) 03/25/2011   Allergic rhinitis    History of broken nose    Menopause, premature    PCP:  Thersia Flax, MD Pharmacy:   [facility] [phone] [place], [place] - [address] AT [facility] [address] [place] [address] [place] [address] Phone: [phone] Fax: [phone]     Social Drivers of Health (SDOH) Social History: SDOH Screenings   Food Insecurity: No Food Insecurity (06/18/2023)  Housing: Unknown (06/18/2023)  Transportation Needs: No Transportation Needs (06/18/2023)  Utilities: Not At Risk (06/18/2023)  Alcohol Screen: Low Risk  (03/06/2023)  Depression (PHQ2-9): Low Risk  (04/21/2023)  Financial Resource Strain: Low Risk  (03/06/2023)  Physical Activity: Insufficiently Active (03/06/2023)  Social Connections: Socially Integrated (06/18/2023)  Stress: No Stress Concern Present (03/06/2023)  Tobacco Use: Low Risk  (06/18/2023)   SDOH Interventions:     Readmission Risk Interventions     No data to display

## 2023-06-24 NOTE — Sedation Documentation (Signed)
 Error reading etco2 due to proble placement. Attempting to fix. Pt with even/unlabored respirations

## 2023-06-24 NOTE — Progress Notes (Signed)
 Nutrition Follow-up  DOCUMENTATION CODES:   Not applicable  INTERVENTION:   -Continue regular diet -Continue MVI with minerals daily -Continue Ensure Plus High Protein po BID, each supplement provides 350 kcal and 20 grams of protein   NUTRITION DIAGNOSIS:   Increased nutrient needs related to cancer and cancer related treatments as evidenced by estimated needs.  Ongoing  GOAL:   Patient will meet greater than or equal to 90% of their needs  Progressing   MONITOR:   PO intake, Supplement acceptance  REASON FOR ASSESSMENT:   Malnutrition Screening Tool    ASSESSMENT:   Pt with medical history significant for pancreatic cancer on chemotherapy proceeding with surgical intervention, GERD, allergic rhinitis, dyslipidemia and type 2 diabetes mellitus as well as Raynaud's phenomenon who presented with acute onset of fever that was up to 102.7 with associated chills. The patient admitted with dysuria without urinary frequency or urgency or hematuria or flank pain.  6/13- s/p Procedure: RIJ port removal  6/18- s/p TEE- no evidence of vegetations or endocarditis   Per MD notes, pt will need PICC prior to discharge.   Pt receiving nursing care at time of visit.   Pt remains on regular diet. Intake has improved; noted meal completions 100%. Pt also drinking Ensure supplements.   No new wt loss since last visit.   Medications reviewed and include lovenox  and florastor.   Labs reviewed: CBGS: 117-160 (inpatient orders for glycemic control are 0-15 units insulin  aspart TID with meals).    Diet Order:   Diet Order             Diet regular Room service appropriate? Yes; Fluid consistency: Thin  Diet effective now                   EDUCATION NEEDS:   Education needs have been addressed  Skin:  Skin Assessment: Reviewed RN Assessment  Last BM:  06/22/23  Height:   Ht Readings from Last 1 Encounters:  06/17/23 5' 4 (1.626 m)    Weight:   Wt Readings from  Last 1 Encounters:  06/17/23 74.8 kg    Ideal Body Weight:  54.5 kg  BMI:  Body mass index is 28.32 kg/m.  Estimated Nutritional Needs:   Kcal:  1900-2100  Protein:  105-120 grams  Fluid:  1.9-2.1 L    Herschel Lords, RD, LDN, CDCES Registered Dietitian III Certified Diabetes Care and Education Specialist If unable to reach this RD, please use RD Inpatient group chat on secure chat between hours of 8am-4 pm daily

## 2023-06-24 NOTE — TOC Transition Note (Signed)
 Transition of Care [facility]) - Discharge Note   Patient Details  Name: Kathleen Russell MRN: [id number] Date of Birth: 26-Jan-1955  [facility] CM/SW Contact:  Zoe Hinds, RN Phone Number: 06/24/2023, 5:19 PM   Clinical Narrative:    This CM updated by covering MD pt medically cleared to dc today. This CM secured  HH with [facility]  infusion with [facility] , Pam confirmed bedside teach with pt and husband today and that IV antibiotic will be delivered to pt's home tonight between 7-9pm. DC transportation confirmed for pt with husband.Medical team updated . No additional DC needs requested by medical team or identified by CM at this time .     Final next level of care: Home w Home Health Services Barriers to Discharge: No Barriers Identified   Patient Goals and CMS Choice Patient states their goals for this hospitalization and ongoing recovery are:: Home with family      Discharge Plan and Services Additional resources added to the After Visit Summary for     Discharge Planning Services: NA Post Acute Care Choice: Home Health            DME Agency: [facility] Date DME Agency Contacted: 06/24/23 Time DME Agency Contacted: 1203 Representative spoke with at DME Agency: Abdul Hodgkin Arranged: RN [facility] Agency: [facility] Date [facility] Agency Contacted: 06/24/23 Time HH Agency Contacted: [id number] Representative spoke with at [facility] Agency: Randel Buss @ [place] & Pam @ [facility] company  Social Drivers of Health (SDOH) Interventions SDOH Screenings   Food Insecurity: No Food Insecurity (06/18/2023)  Housing: Unknown (06/18/2023)  Transportation Needs: No Transportation Needs (06/18/2023)  Utilities: Not At Risk (06/18/2023)  Alcohol Screen: Low Risk  (03/06/2023)  Depression (PHQ2-9): Low Risk  (04/21/2023)  Financial Resource Strain: Low Risk  (03/06/2023)  Physical Activity: Insufficiently Active (03/06/2023)  Social Connections: Socially Integrated (06/18/2023)   Stress: No Stress Concern Present (03/06/2023)  Tobacco Use: Low Risk  (06/18/2023)     Readmission Risk Interventions     No data to display

## 2023-06-24 NOTE — Progress Notes (Signed)
 Transesophageal Echocardiogram :  Indication: bacteremia, r/o endocarditis Requesting/ordering  physician:   Procedure: Benzocaine spray x2 and 2 mls x 2 of viscous lidocaine  were given orally to provide local anesthesia to the oropharynx. The patient was positioned supine on the left side, bite block provided. The patient was moderately sedated with the doses of versed  and fentanyl  as detailed below.  Using digital technique an omniplane probe was advanced into the distal esophagus without incident.   Moderate sedation: 1. Sedation used:  Versed : 3 mg, Fentanyl : 75 ug 2. Time administered:  7:55 AM   Time when patient started recovery: 8:25 Total sedation time 30 min 3. I was face to face during this time  See report in EPIC  for complete details: In brief, No valve vegetation/no evidence of endocarditis  transgastric imaging revealed normal LV function with no RWMAs and no mural apical thrombus .  Estimated ejection fraction was 55%.  Right sided cardiac chambers were normal with no evidence of pulmonary hypertension.  Imaging of the septum showed no ASD or VSD Bubble study was negative for shunt 2D and color flow confirmed no PFO  Mild MR  The LA was well visualized in orthogonal views.  There was no spontaneous contrast and no thrombus in the LA and LA appendage   The descending thoracic aorta had no  mural aortic debris with no evidence of aneurysmal dilation or disection   Ramal Eckhardt 06/24/2023 8:21 AM

## 2023-06-24 NOTE — Progress Notes (Addendum)
 ID brief note and OPAT details  68 yo female Kathleen Russell, dm2, gerd, recently dx'ed with pancreatic tail cancer, neoadjuvant chemotherapy (cycle 4 of 6 last given about 2 weeks prior to admission), admitted 6/11 for several days malaise found to have sepsis and mssa bacteremia. Norovirus + stool  Source port vs gut tranlocation 6/11 bcx positive 6/11 ucx negative 6/12 bcx negative 6/13 bcx (prior to removal of port) ngtd 6/13 catheter tip < 15 CFU Staph aureus.  No other focal pain or ortho/cardiac device. She has left thumb carpal-metacarpal tendon repair in the past and no sx at this time   6/16-  TTE neg.Aaron Aas Port removed, tip+, Cleared  blood cultures cultures  6/18 TEE neg  Plan:  Continue cefazolin  for 4 weeks from port removal 6/13 No other metastatic foci per history/exam at this time From id standpoint, would like to see clearance of bacteremia  and clinical improvement prior to starting chemo again Can use picc for chemo or consider port replacement once resolved.   Infectious Disease Long Term IV Antibiotic Orders Kathleen Russell May 25, 1955  Diagnosis:  MSSA bacteremia, port infection   LABS Lab Results  Component Value Date   CREATININE 0.51 06/24/2023   Lab Results  Component Value Date   WBC 4.1 06/24/2023   HGB 10.9 (L) 06/24/2023   HCT 32.8 (L) 06/24/2023   MCV 86.5 06/24/2023   PLT 240 06/24/2023   Lab Results  Component Value Date   ESRSEDRATE 34 (H) 03/25/2019   No results found for: CRP  Allergies:  Allergies  Allergen Reactions   Latex Itching and Rash    Discharge antibiotics Cefazolin       2 grams every  8 hours PICC Care per protocol Labs weekly while on IV antibiotics -FAX weekly labs to [phone]  CBC w diff   Comprehensive met panel   Planned duration of antibiotics 4 weeks   Stop date July 17 2023    Follow up clinic date 3 weeks   Kathleen Gold, MD

## 2023-06-24 NOTE — Plan of Care (Signed)
  Problem: Fluid Volume: Goal: Hemodynamic stability will improve Outcome: Progressing   Problem: Clinical Measurements: Goal: Signs and symptoms of infection will decrease Outcome: Progressing   Problem: Respiratory: Goal: Ability to maintain adequate ventilation will improve Outcome: Progressing   Problem: Education: Goal: Ability to describe self-care measures that may prevent or decrease complications (Diabetes Survival Skills Education) will improve Outcome: Progressing

## 2023-06-25 ENCOUNTER — Encounter: Payer: Self-pay | Admitting: Cardiovascular Disease

## 2023-06-25 ENCOUNTER — Encounter: Payer: Self-pay | Admitting: Oncology

## 2023-06-25 ENCOUNTER — Other Ambulatory Visit: Payer: Self-pay

## 2023-06-25 ENCOUNTER — Telehealth: Payer: Self-pay

## 2023-06-25 DIAGNOSIS — Z792 Long term (current) use of antibiotics: Secondary | ICD-10-CM | POA: Diagnosis not present

## 2023-06-25 DIAGNOSIS — T80211A Bloodstream infection due to central venous catheter, initial encounter: Secondary | ICD-10-CM | POA: Diagnosis not present

## 2023-06-25 DIAGNOSIS — I081 Rheumatic disorders of both mitral and tricuspid valves: Secondary | ICD-10-CM | POA: Diagnosis not present

## 2023-06-25 DIAGNOSIS — Z9181 History of falling: Secondary | ICD-10-CM | POA: Diagnosis not present

## 2023-06-25 DIAGNOSIS — I7300 Raynaud's syndrome without gangrene: Secondary | ICD-10-CM | POA: Diagnosis not present

## 2023-06-25 DIAGNOSIS — Z8616 Personal history of COVID-19: Secondary | ICD-10-CM | POA: Diagnosis not present

## 2023-06-25 DIAGNOSIS — E785 Hyperlipidemia, unspecified: Secondary | ICD-10-CM | POA: Diagnosis not present

## 2023-06-25 DIAGNOSIS — B9561 Methicillin susceptible Staphylococcus aureus infection as the cause of diseases classified elsewhere: Secondary | ICD-10-CM | POA: Diagnosis not present

## 2023-06-25 DIAGNOSIS — C259 Malignant neoplasm of pancreas, unspecified: Secondary | ICD-10-CM | POA: Diagnosis not present

## 2023-06-25 DIAGNOSIS — K8689 Other specified diseases of pancreas: Secondary | ICD-10-CM

## 2023-06-25 DIAGNOSIS — K219 Gastro-esophageal reflux disease without esophagitis: Secondary | ICD-10-CM | POA: Diagnosis not present

## 2023-06-25 DIAGNOSIS — Z452 Encounter for adjustment and management of vascular access device: Secondary | ICD-10-CM | POA: Diagnosis not present

## 2023-06-25 DIAGNOSIS — Z87440 Personal history of urinary (tract) infections: Secondary | ICD-10-CM | POA: Diagnosis not present

## 2023-06-25 DIAGNOSIS — Z85828 Personal history of other malignant neoplasm of skin: Secondary | ICD-10-CM | POA: Diagnosis not present

## 2023-06-25 DIAGNOSIS — C252 Malignant neoplasm of tail of pancreas: Secondary | ICD-10-CM

## 2023-06-25 DIAGNOSIS — E1151 Type 2 diabetes mellitus with diabetic peripheral angiopathy without gangrene: Secondary | ICD-10-CM | POA: Diagnosis not present

## 2023-06-25 NOTE — Transitions of Care (Post Inpatient/ED Visit) (Signed)
 06/25/2023  Name: Kathleen Russell MRN: [id number] DOB: 1955/01/08  Today's TOC FU Call Status: Today's TOC FU Call Status:: Successful TOC FU Call Completed TOC FU Call Complete Date: 06/25/23 Patient's Name and Date of Birth confirmed.  Transition Care Management Follow-up Telephone Call Date of Discharge: 06/24/23 Discharge [facility] Type of Discharge: Inpatient Admission Primary Inpatient Discharge Diagnosis:: Port Infections How have you been since you were released from the hospital?: Same Any questions or concerns?: No  Items Reviewed: Did you receive and understand the discharge instructions provided?: Yes Medications obtained,verified, and reconciled?: Yes (Medications Reviewed) Any new allergies since your discharge?: No Dietary orders reviewed?: Yes Type of Diet Ordered:: Low sodium Heart Helathy Do you have support at home?: Yes People in Home [RPT]: spouse Name of Support/Comfort Primary Source: Tashea Othman  Medications Reviewed Today: Medications Reviewed Today     Reviewed by Claudene Crystal, RN (Case Manager) on 06/25/23 at 1357  Med List Status: <None>   Medication Order Taking? Sig Documenting Provider Last Dose Status Informant  acetaminophen  (TYLENOL ) 500 MG tablet [id number] Yes Take by mouth. [provider]  Active Self, Pharmacy Records  blood glucose meter kit and supplies [id number] Yes Dispense based on patient and insurance preference. Use up to four times daily as directed. (FOR ICD-10 E10.9, E11.9). Thersia Flax, MD  Active Self, Pharmacy Records  ceFAZolin  (ANCEF ) IVPB [id number] Yes Inject 2 g into the vein every 8 (eight) hours for 23 days. Indication:  MSSA Bacteremia  First Dose: Yes Last Day of Therapy:  07/17/23  PICC Care per protocol Labs weekly while on IV antibiotics -FAX weekly labs to [phone]  CBC w diff         Comprehensive met panel Method of administration: IV  Push Method of administration may be changed at the discretion of home infusion pharmacist based upon assessment of the patient and/or caregiver's ability to self-administer the medication ordered. Eartha Gold, MD  Active   dexamethasone  (DECADRON ) 4 MG tablet [id number] Yes Take 2 tablets (8 mg total) by mouth daily. Take 2 tablets daily x 3 days starting the day after chemotherapy. Take with food. Avonne Boettcher, MD  Active Self, Pharmacy Records  dextrose  5 % solution [id number]   Rao, Archana C, MD  Active   diclofenac Sodium (VOLTAREN) 1 % GEL [id number] Yes Apply topically 4 (four) times daily. [provider]  Active Self, Pharmacy Records           Med Note [facility], [place] A   Thu Jun 18, 2023  9:22 AM) PRN  esomeprazole  (NEXIUM ) 40 MG capsule [id number] Yes TAKE 1 CAPSULE BY MOUTH DAILY BEFORE BREAKFAST Thersia Flax, MD  Active Self, Pharmacy Records  estradiol  (ESTRACE ) 0.1 MG/GM vaginal cream [id number] Yes INSERT 1 APPLICATORFUL VAGINALLY 3 TIMES A WEEK Thersia Flax, MD  Active Self, Pharmacy Records  fluticasone  (FLONASE ) 50 MCG/ACT nasal spray [id number] Yes SHAKE LIQUID AND USE 2 SPRAYS IN EACH NOSTRIL AT BEDTIME AS NEEDED Rondall Codding, FNP  Active Self, Pharmacy Records  levocetirizine [place] ALLERGY 24HR) 5 MG tablet [id number] Yes  [provider]  Active Self, Pharmacy Records  lidocaine -prilocaine  (EMLA ) cream [id number] Yes Apply to affected area once Avonne Boettcher, MD  Active Self, Pharmacy Records           Med Note [facility], [place] A   Thu Jun 18, 2023  9:19 AM) PRN  loperamide  (IMODIUM ) 2 MG capsule [id number]  Yes Take 2 tabs by mouth with first loose stool, then 1 tab with each additional loose stool as needed. Do not exceed 8 tabs in a 24-hour period Avonne Boettcher, MD  Active Self, Pharmacy Records  magic mouthwash (multi-ingredient) oral suspension [id number] Yes Swish and swallow with 5-10 mLs 4 (four) times daily. Avonne Boettcher, MD  Active  Self, Pharmacy Records  metroNIDAZOLE  (METROGEL ) 1 % gel [id number] Yes Apply topically daily. Thersia Flax, MD  Active Self, Pharmacy Records  ondansetron  (ZOFRAN ) 8 MG tablet [id number] Yes Take 1 tablet (8 mg total) by mouth every 8 (eight) hours as needed for nausea or vomiting. Start on the third day after chemotherapy Rao, Archana C, MD  Active Self, Pharmacy Records  prochlorperazine  (COMPAZINE ) 10 MG tablet [id number] Yes Take 1 tablet (10 mg total) by mouth every 6 (six) hours as needed for nausea or vomiting (Nausea or vomiting). Avonne Boettcher, MD  Active Self, Pharmacy Records  Propylene Glycol [facility] COMPLETE OP) [id number] Yes Apply to eye. [provider]  Active Self, Pharmacy Records  rosuvastatin  (CRESTOR ) 20 MG tablet [id number] Yes TAKE 1 TABLET(20 MG) BY MOUTH DAILY [name], [name], MD  Active Self, Pharmacy Records  sitaGLIPtin  (JANUVIA ) 25 MG tablet [id number] Yes Take 1 tablet (25 mg total) by mouth daily. Thersia Flax, MD  Active Self, Pharmacy Records  VENTOLIN  HFA 108 [phone] Base) MCG/ACT inhaler [id number] Yes Inhale 2 puffs into the lungs every 6 (six) hours as needed. Calista Catching, FNP  Active Self, Pharmacy Records            Home Care and Equipment/Supplies: Were Home Health Services Ordered?: Yes Name of Home Health Agency:: [facility] Has Agency set up a time to come to your home?: Yes First Home Health Visit Date: 06/24/23 Any new equipment or medical supplies ordered?: Yes Name of Medical supply agency?: [facility] PICC line supplies and infusion Were you able to get the equipment/medical supplies?: Yes Do you have any questions related to the use of the equipment/supplies?: No  Functional Questionnaire: Do you need assistance with bathing/showering or dressing?: No Do you need assistance with meal preparation?: No Do you need assistance with eating?: No Do you have difficulty maintaining continence: No Do you need assistance with  getting out of bed/getting out of a chair/moving?: No Do you have difficulty managing or taking your medications?: No  Follow up appointments reviewed: PCP Follow-up appointment confirmed?: Yes Date of PCP follow-up appointment?: 07/08/23 Follow-up Provider: Creta Dolin Specialist [facility] Follow-up appointment confirmed?: NA Do you need transportation to your follow-up appointment?: No Do you understand care options if your condition(s) worsen?: Yes-patient verbalized understanding  SDOH Interventions Today    Flowsheet Row Most Recent Value  SDOH Interventions   Food Insecurity Interventions Intervention Not Indicated  Housing Interventions Intervention Not Indicated  Transportation Interventions Intervention Not Indicated  Utilities Interventions Intervention Not Indicated    Gareld June, BSN, RN [place]  VBCI - [facility] RN Care Manager [phone]

## 2023-06-30 ENCOUNTER — Other Ambulatory Visit: Payer: Self-pay | Admitting: Internal Medicine

## 2023-06-30 ENCOUNTER — Telehealth: Payer: Self-pay

## 2023-06-30 DIAGNOSIS — E119 Type 2 diabetes mellitus without complications: Secondary | ICD-10-CM

## 2023-06-30 DIAGNOSIS — A4101 Sepsis due to Methicillin susceptible Staphylococcus aureus: Secondary | ICD-10-CM | POA: Diagnosis not present

## 2023-06-30 DIAGNOSIS — E785 Hyperlipidemia, unspecified: Secondary | ICD-10-CM

## 2023-06-30 MED ORDER — LANTUS SOLOSTAR 100 UNIT/ML ~~LOC~~ SOPN
15.0000 [IU] | PEN_INJECTOR | Freq: Every day | SUBCUTANEOUS | 99 refills | Status: DC
Start: 2023-06-30 — End: 2023-11-12

## 2023-06-30 MED ORDER — FREESTYLE LIBRE 3 PLUS SENSOR MISC: 2 refills | Status: DC

## 2023-06-30 NOTE — Telephone Encounter (Addendum)
 She does not have a hospital follow up scheduled but she is scheduled to see you on 07/08/2023. Would you like for her to have the labs before that appt?   Pt is aware of the insulin  and sensor being sent to pharmacy. Pt has never given herself insulin  so she has been scheduled for a teaching tomorrow.

## 2023-06-30 NOTE — Addendum Note (Signed)
 Addended by: MARYLYNN VERNEITA CROME on: 06/30/2023 12:34 PM   Modules accepted: Orders

## 2023-06-30 NOTE — Telephone Encounter (Signed)
 Montie LULLA Eward D[place] to [place], CMA     06/30/23  8:57 AM Thank you for reaching out. I have enough meds to last until July 2. I was wearing a Libre 3 Plus blood sugar monitor and finally have a full 14 days of data. The [place] 3 expired while I was in the hospital last week s I removed it. Would Dr. Marylynn like to see the data,? Also, would she like me to wear another [place] 3?

## 2023-06-30 NOTE — Assessment & Plan Note (Signed)
 Loss of control noted since she was diagnosed with pancreatic CA.  Discharged from [facility] in early June for sepsis /bacteremia and UTI on Jardiance only. All BS have been  > 180 , with 26% > 250 per review Freestyle Libre monitor worn for the for period May 30 to June 12 .  TAKING STEROIDS AS PART OF CHEMOTHERAPY

## 2023-06-30 NOTE — Addendum Note (Signed)
 Addended by: MARYLYNN VERNEITA CROME on: 06/30/2023 12:36 PM   Modules accepted: Orders

## 2023-06-30 NOTE — Telephone Encounter (Signed)
 I have printed the data and placed in the yellow results folder.   I spoke with Kathleen Russell to let her know that Dr. Marylynn would review the data and then let us  know if she needs to wear another sensor. Kathleen Russell wanted to let Dr. Marylynn know that while she was in the hospital she was given insulin  several times a day. Now that she is home she is not using the insulin  and only taking the Januvia  25 mg in the morning.

## 2023-07-01 ENCOUNTER — Telehealth: Payer: Self-pay

## 2023-07-01 ENCOUNTER — Ambulatory Visit

## 2023-07-01 ENCOUNTER — Other Ambulatory Visit (HOSPITAL_COMMUNITY): Payer: Self-pay

## 2023-07-01 ENCOUNTER — Inpatient Hospital Stay

## 2023-07-01 ENCOUNTER — Encounter: Payer: Self-pay | Admitting: Oncology

## 2023-07-01 ENCOUNTER — Inpatient Hospital Stay: Admitting: Oncology

## 2023-07-01 VITALS — BP 104/68 | HR 99 | Temp 96.8°F | Resp 19 | Ht 64.0 in | Wt 169.9 lb

## 2023-07-01 DIAGNOSIS — C259 Malignant neoplasm of pancreas, unspecified: Secondary | ICD-10-CM | POA: Diagnosis not present

## 2023-07-01 DIAGNOSIS — E119 Type 2 diabetes mellitus without complications: Secondary | ICD-10-CM | POA: Diagnosis not present

## 2023-07-01 DIAGNOSIS — E1169 Type 2 diabetes mellitus with other specified complication: Secondary | ICD-10-CM

## 2023-07-01 DIAGNOSIS — C252 Malignant neoplasm of tail of pancreas: Secondary | ICD-10-CM

## 2023-07-01 DIAGNOSIS — K8689 Other specified diseases of pancreas: Secondary | ICD-10-CM

## 2023-07-01 DIAGNOSIS — R7881 Bacteremia: Secondary | ICD-10-CM | POA: Diagnosis not present

## 2023-07-01 LAB — CULTURE, BLOOD (ROUTINE X 2)
Culture: NO GROWTH
Culture: NO GROWTH
Special Requests: ADEQUATE
Special Requests: ADEQUATE

## 2023-07-01 LAB — CMP (CANCER CENTER ONLY)
ALT: 7 U/L (ref 0–44)
AST: 18 U/L (ref 15–41)
Albumin: 3.5 g/dL (ref 3.5–5.0)
Alkaline Phosphatase: 60 U/L (ref 38–126)
Anion gap: 7 (ref 5–15)
BUN: 13 mg/dL (ref 8–23)
CO2: 23 mmol/L (ref 22–32)
Calcium: 8.4 mg/dL — ABNORMAL LOW (ref 8.9–10.3)
Chloride: 107 mmol/L (ref 98–111)
Creatinine: 0.56 mg/dL (ref 0.44–1.00)
GFR, Estimated: >60 mL/min (ref >60)
Glucose, Bld: 148 mg/dL — ABNORMAL HIGH (ref 70–99)
Potassium: 3.8 mmol/L (ref 3.5–5.1)
Sodium: 137 mmol/L (ref 135–145)
Total Bilirubin: 0.5 mg/dL (ref 0.0–1.2)
Total Protein: 6.2 g/dL — ABNORMAL LOW (ref 6.5–8.1)

## 2023-07-01 LAB — CBC WITH DIFFERENTIAL (CANCER CENTER ONLY)
Abs Immature Granulocytes: 0.03 10*3/uL (ref 0.00–0.07)
Basophils Absolute: 0.1 10*3/uL (ref 0.0–0.1)
Basophils Relative: 1 %
Eosinophils Absolute: 0 10*3/uL (ref 0.0–0.5)
Eosinophils Relative: 1 %
HCT: 35.6 % — ABNORMAL LOW (ref 36.0–46.0)
Hemoglobin: 11.1 g/dL — ABNORMAL LOW (ref 12.0–15.0)
Immature Granulocytes: 0 %
Lymphocytes Relative: 38 %
Lymphs Abs: 2.6 10*3/uL (ref 0.7–4.0)
MCH: 28 pg (ref 26.0–34.0)
MCHC: 31.2 g/dL (ref 30.0–36.0)
MCV: 89.7 fL (ref 80.0–100.0)
Monocytes Absolute: 0.7 10*3/uL (ref 0.1–1.0)
Monocytes Relative: 10 %
Neutro Abs: 3.4 10*3/uL (ref 1.7–7.7)
Neutrophils Relative %: 50 %
Platelet Count: 199 10*3/uL (ref 150–400)
RBC: 3.97 MIL/uL (ref 3.87–5.11)
RDW: 15.3 % (ref 11.5–15.5)
WBC Count: 6.8 10*3/uL (ref 4.0–10.5)
nRBC: 0 % (ref 0.0–0.2)

## 2023-07-01 NOTE — Telephone Encounter (Signed)
 Dr. Melanee would like a Tentative port placement for 7/17 originally (patient preference is 7/16); with chemo to restart 7/2. Per Kathleen Russell, I don't have a physician schedule out that far, but I can go ahead and say 11a an arrive at 10a on Wed 7/16. Would that work?.  Outbound call to patient; informed of above and agreed to 7/16 appointment 11am, arrival time 10am.  Reviewed NPO after midnight, will need a driver due to moderate sedation involved; can take minimal water  to get morning meds down.  Also informed when it is closer to appointment they will reach out and review more details with her; patient verbalized understanding.

## 2023-07-01 NOTE — Telephone Encounter (Signed)
 Pharmacy Patient Advocate Encounter   Received notification from MD Office that prior authorization for Freestyle Libre 3 Plus Sensor is required/requested.   Insurance verification completed.   The patient is insured through [facility] ADVANTAGE/RX ADVANCE .   Per test claim: The current 30 day co-pay is, $0.  No PA needed at this time. This test claim was processed through [facility]- copay amounts may vary at other pharmacies due to pharmacy/plan contracts, or as the patient moves through the different stages of their insurance plan.

## 2023-07-01 NOTE — Telephone Encounter (Signed)
Lab appt has been scheduled.

## 2023-07-01 NOTE — Telephone Encounter (Signed)
 PA needed for [facility] 3 plus sensor

## 2023-07-01 NOTE — Progress Notes (Signed)
 Pt presented today for insulin  teaching. I walked pt through how to give herself the injection, then I watched pt give herself her first insulin  injection. Pt stated that she felt comfortable giving to herself.   Pt did want to know if she is supposed to continue with the Januvia  since she started the insulin ?

## 2023-07-01 NOTE — Telephone Encounter (Signed)
 LMTCB. Please let pt know that the [facility] sensor has been approved and she has a $0 copay.

## 2023-07-02 LAB — CANCER ANTIGEN 19-9: CA 19-9: 392 U/mL — ABNORMAL HIGH (ref 0–35)

## 2023-07-04 ENCOUNTER — Encounter: Payer: Self-pay | Admitting: Oncology

## 2023-07-04 NOTE — Progress Notes (Signed)
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 dissection, the port catheter and reservoir were removed from the underlying subcutaneous tissue in their entirety. Securing sutures were also removed. The pocket was irrigated with a copious amount of sterile normal saline. The pocket was closed with interrupted 3-0 Vicryl stitches. The subcutaneous tissue was closed with 3-0 Vicryl interrupted subcutaneous stitches. A 4-0 Vicryl running subcuticular stitch was utilized to approximate the skin. Dermabond was applied. IMPRESSION: Successful right IJ vein Port-A-Cath explant. The tip of the catheter was cut and sent to laboratory for culture and sensitivity. Electronically Signed   By: Cordella Banner   On: 06/19/2023 10:11   DG Chest 2 View Result Date: 06/17/2023 CLINICAL DATA:  Fever EXAM: CHEST - 2 VIEW COMPARISON:  02/17/2020 FINDINGS: Right-sided central venous catheter tip at the cavoatrial junction. No acute airspace disease, pleural effusion, or pneumothorax. Stable cardiomediastinal silhouette IMPRESSION: No active cardiopulmonary disease. Electronically Signed   By: Luke Bun M.D.   On: 06/17/2023 21:40     Assessment and plan- Patient is a 68 y.o. 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  Treatment course has been complicated by MSSA infection involving her port.  Port has been removed and patient is presently receiving IV antibiotics  through her PICC line which will go through 07/17/2023.  Chemotherapy will be on hold until then.  It would be difficult to administer infusional 5-FU chemotherapy through her PICC since it goes over 2 days and it would be difficult for the patient to use her arm while getting chemo through pump. I therefore think that she will need another port placement before we start her next cycle of chemotherapy.  I will reach out to Dr. Epifanio regarding this as well.  CA19-9 is trending down which is encouraging. Repeat scans will be done at [facility].  Patient will see dr jacobo on 7/14 or 7/15. Tentative port placement on 7/17. Chemo to restart 7/21   Visit Diagnosis 1. Malignant neoplasm of tail of pancreas (HCC)      Dr. Annah Skene, MD, MPH [facility] at [facility] [phone] 07/04/2023 5:52 PM

## 2023-07-06 ENCOUNTER — Other Ambulatory Visit (INDEPENDENT_AMBULATORY_CARE_PROVIDER_SITE_OTHER)

## 2023-07-06 DIAGNOSIS — I7300 Raynaud's syndrome without gangrene: Secondary | ICD-10-CM | POA: Diagnosis not present

## 2023-07-06 DIAGNOSIS — C259 Malignant neoplasm of pancreas, unspecified: Secondary | ICD-10-CM | POA: Diagnosis not present

## 2023-07-06 DIAGNOSIS — I081 Rheumatic disorders of both mitral and tricuspid valves: Secondary | ICD-10-CM | POA: Diagnosis not present

## 2023-07-06 DIAGNOSIS — E1151 Type 2 diabetes mellitus with diabetic peripheral angiopathy without gangrene: Secondary | ICD-10-CM | POA: Diagnosis not present

## 2023-07-06 DIAGNOSIS — Z87440 Personal history of urinary (tract) infections: Secondary | ICD-10-CM | POA: Diagnosis not present

## 2023-07-06 DIAGNOSIS — E785 Hyperlipidemia, unspecified: Secondary | ICD-10-CM | POA: Diagnosis not present

## 2023-07-06 DIAGNOSIS — K219 Gastro-esophageal reflux disease without esophagitis: Secondary | ICD-10-CM | POA: Diagnosis not present

## 2023-07-06 DIAGNOSIS — E119 Type 2 diabetes mellitus without complications: Secondary | ICD-10-CM

## 2023-07-06 DIAGNOSIS — B9561 Methicillin susceptible Staphylococcus aureus infection as the cause of diseases classified elsewhere: Secondary | ICD-10-CM | POA: Diagnosis not present

## 2023-07-06 DIAGNOSIS — Z794 Long term (current) use of insulin: Secondary | ICD-10-CM | POA: Diagnosis not present

## 2023-07-06 DIAGNOSIS — Z85828 Personal history of other malignant neoplasm of skin: Secondary | ICD-10-CM | POA: Diagnosis not present

## 2023-07-06 DIAGNOSIS — T80211A Bloodstream infection due to central venous catheter, initial encounter: Secondary | ICD-10-CM | POA: Diagnosis not present

## 2023-07-06 DIAGNOSIS — Z452 Encounter for adjustment and management of vascular access device: Secondary | ICD-10-CM | POA: Diagnosis not present

## 2023-07-06 DIAGNOSIS — Z8616 Personal history of COVID-19: Secondary | ICD-10-CM | POA: Diagnosis not present

## 2023-07-06 LAB — COMPREHENSIVE METABOLIC PANEL WITH GFR
ALT: 3 U/L (ref 0–35)
AST: 12 U/L (ref 0–37)
Albumin: 3.8 g/dL (ref 3.5–5.2)
Alkaline Phosphatase: 56 U/L (ref 39–117)
BUN: 12 mg/dL (ref 6–23)
CO2: 24 meq/L (ref 19–32)
Calcium: 8.9 mg/dL (ref 8.4–10.5)
Chloride: 106 meq/L (ref 96–112)
Creatinine, Ser: 0.57 mg/dL (ref 0.40–1.20)
GFR: 93.01 mL/min (ref >60.00)
Glucose, Bld: 151 mg/dL — ABNORMAL HIGH (ref 70–99)
Potassium: 4 meq/L (ref 3.5–5.1)
Sodium: 139 meq/L (ref 135–145)
Total Bilirubin: 0.3 mg/dL (ref 0.2–1.2)
Total Protein: 6.4 g/dL (ref 6.0–8.3)

## 2023-07-06 LAB — HEMOGLOBIN A1C: Hgb A1c MFr Bld: 7.8 % — ABNORMAL HIGH (ref 4.6–6.5)

## 2023-07-07 ENCOUNTER — Ambulatory Visit: Payer: Self-pay | Admitting: Internal Medicine

## 2023-07-07 DIAGNOSIS — A4101 Sepsis due to Methicillin susceptible Staphylococcus aureus: Secondary | ICD-10-CM | POA: Diagnosis not present

## 2023-07-08 ENCOUNTER — Encounter: Payer: Self-pay | Admitting: Internal Medicine

## 2023-07-08 ENCOUNTER — Ambulatory Visit (INDEPENDENT_AMBULATORY_CARE_PROVIDER_SITE_OTHER): Admitting: Internal Medicine

## 2023-07-08 VITALS — BP 124/60 | HR 94 | Ht 64.0 in | Wt 170.2 lb

## 2023-07-08 DIAGNOSIS — E1169 Type 2 diabetes mellitus with other specified complication: Secondary | ICD-10-CM | POA: Diagnosis not present

## 2023-07-08 DIAGNOSIS — C259 Malignant neoplasm of pancreas, unspecified: Secondary | ICD-10-CM | POA: Diagnosis not present

## 2023-07-08 DIAGNOSIS — E669 Obesity, unspecified: Secondary | ICD-10-CM

## 2023-07-08 DIAGNOSIS — E785 Hyperlipidemia, unspecified: Secondary | ICD-10-CM

## 2023-07-08 DIAGNOSIS — Z794 Long term (current) use of insulin: Secondary | ICD-10-CM | POA: Diagnosis not present

## 2023-07-08 DIAGNOSIS — E119 Type 2 diabetes mellitus without complications: Secondary | ICD-10-CM | POA: Diagnosis not present

## 2023-07-08 DIAGNOSIS — C252 Malignant neoplasm of tail of pancreas: Secondary | ICD-10-CM | POA: Diagnosis not present

## 2023-07-08 DIAGNOSIS — R7881 Bacteremia: Secondary | ICD-10-CM | POA: Diagnosis not present

## 2023-07-08 NOTE — Patient Instructions (Signed)
 Ok to increase lantus  to 18 units if YOU SEE A TREND WHERE all BS are consistently > 140   Send me a test when you start taking dexamethasone  because we will need to add novolog  insulin  at  mealtime  Do not worry about immediate post prandial peaks.  As long as the reading is 160 or less 2 HOURS AFTER MEAL,  you are in control

## 2023-07-08 NOTE — Progress Notes (Unsigned)
 Subjective:  Patient ID: Kathleen Russell, female    DOB: 1955/12/17  Age: 68 y.o. MRN: [id number]  CC: The primary encounter diagnosis was Type 2 diabetes mellitus with obesity (HCC). Diagnoses of Hyperlipidemia LDL goal <70 and Malignant neoplasm of pancreas, unspecified location of malignancy [facility]) were also pertinent to this visit.   HPI Kathleen Russell presents for  Chief Complaint  Patient presents with   Medical Management of Chronic Issues    1) type 2 DM follow up: currently taking insulin  due to  use of steroids and loss of endocrine function associated with treatment of pancreatic CA  I have downloaded and reviewed the data from patient's [facility] 3 continuous blood glucose monitor  for  the period  of   June 19 to July 2   Patient's  sugars have been  IN RANGE  89    % OF THE TIME,   ABOVE RANGE 11   % of the time.  And  BELOW  RANGE   % OF THE TIME .  Medications currently used for glycemic control are   Lantus  15 units and Januvia  25 mg daily    Patient reports compliance with medication approximately 100   % of the tine.   2) pancreatic CA:  she began chemo  but treatment is now on hold due to MSSA bacteremia diagnosed June 11.   3) MSSA bacteremia :   Kathleen Russell was admitted to [facility]  on June 11 with sepsis secondary to MSSA bacteremia which was cultured from the tip of her porta  cath.  she presented  with fever and rigors.and diagnosed with SIRS.   The porta cath was removed, and she has been receiving  cefazolin  via PICC line in right arm which is scheduled to end on  7/11, followed by ID f/u I her chemotherapy has been suspended until infection has been confirmed resolved.   TEE negative for endocarditis. The plan is to use  the current picc for chemo.   Lab Results  Component Value Date   CHOL 136 11/17/2022   HDL 64.10 11/17/2022   LDLCALC 47 11/17/2022   LDLDIRECT 53.0 11/17/2022   TRIG 125.0 11/17/2022   CHOLHDL 2 11/17/2022       Outpatient Medications  Prior to Visit  Medication Sig Dispense Refill   acetaminophen  (TYLENOL ) 500 MG tablet Take by mouth.     blood glucose meter kit and supplies Dispense based on patient and insurance preference. Use up to four times daily as directed. (FOR ICD-10 E10.9, E11.9). 1 each 0   ceFAZolin  (ANCEF ) IVPB Inject 2 g into the vein every 8 (eight) hours for 23 days. Indication:  MSSA Bacteremia  First Dose: Yes Last Day of Therapy:  07/17/23  PICC Care per protocol Labs weekly while on IV antibiotics -FAX weekly labs to [phone]  CBC w diff         Comprehensive met panel Method of administration: IV Push Method of administration may be changed at the discretion of home infusion pharmacist based upon assessment of the patient and/or caregiver's ability to self-administer the medication ordered. 69 Units 0   Continuous Glucose Sensor (FREESTYLE LIBRE 3 PLUS SENSOR) MISC Change sensor every 15 days. 2 each 2   dexamethasone  (DECADRON ) 4 MG tablet Take 2 tablets (8 mg total) by mouth daily. Take 2 tablets daily x 3 days starting the day after chemotherapy. Take with food. 30 tablet 1   esomeprazole  (NEXIUM ) 40 MG capsule TAKE  1 CAPSULE BY MOUTH DAILY BEFORE BREAKFAST 90 capsule 1   estradiol  (ESTRACE ) 0.1 MG/GM vaginal cream INSERT 1 APPLICATORFUL VAGINALLY 3 TIMES A WEEK 42.5 g 12   fluticasone  (FLONASE ) 50 MCG/ACT nasal spray SHAKE LIQUID AND USE 2 SPRAYS IN EACH NOSTRIL AT BEDTIME AS NEEDED 16 g 5   insulin  glargine (LANTUS  SOLOSTAR) 100 UNIT/ML Solostar Pen Inject 15 Units into the skin daily. 15 mL PRN   levocetirizine (XYZAL ALLERGY 24HR) 5 MG tablet      lidocaine -prilocaine  (EMLA ) cream Apply to affected area once 30 g 3   loperamide  (IMODIUM ) 2 MG capsule Take 2 tabs by mouth with first loose stool, then 1 tab with each additional loose stool as needed. Do not exceed 8 tabs in a 24-hour period 60 capsule 3   magic mouthwash (multi-ingredient) oral suspension Swish and swallow with 5-10 mLs 4 (four)  times daily. 480 mL 3   metroNIDAZOLE  (METROGEL ) 1 % gel Apply topically daily. 45 g 0   ondansetron  (ZOFRAN ) 8 MG tablet Take 1 tablet (8 mg total) by mouth every 8 (eight) hours as needed for nausea or vomiting. Start on the third day after chemotherapy 30 tablet 1   prochlorperazine  (COMPAZINE ) 10 MG tablet Take 1 tablet (10 mg total) by mouth every 6 (six) hours as needed for nausea or vomiting (Nausea or vomiting). 30 tablet 1   Propylene Glycol (SYSTANE COMPLETE OP) Apply to eye.     rosuvastatin  (CRESTOR ) 20 MG tablet TAKE 1 TABLET(20 MG) BY MOUTH DAILY 90 tablet 1   sitaGLIPtin  (JANUVIA ) 25 MG tablet Take 1 tablet (25 mg total) by mouth daily. 90 tablet 1   VENTOLIN  HFA 108 (90 Base) MCG/ACT inhaler Inhale 2 puffs into the lungs every 6 (six) hours as needed. 1 each 1   diclofenac Sodium (VOLTAREN) 1 % GEL Apply topically 4 (four) times daily. (Patient not taking: Reported on 07/08/2023)     Facility-Administered Medications Prior to Visit  Medication Dose Route Frequency Provider Last Rate Last Admin   dextrose  5 % solution   Intravenous Continuous Melanee Annah BROCKS, MD 10 mL/hr at 05/08/23 0909 New Bag at 05/08/23 0909    Review of Systems;  Patient denies headache, fevers, malaise, unintentional weight loss, skin rash, eye pain, sinus congestion and sinus pain, sore throat, dysphagia,  hemoptysis , cough, dyspnea, wheezing, chest pain, palpitations, orthopnea, edema, abdominal pain, nausea, melena, diarrhea, constipation, flank pain, dysuria, hematuria, urinary  Frequency, nocturia, numbness, tingling, seizures,  Focal weakness, Loss of consciousness,  Tremor, insomnia, depression, anxiety, and suicidal ideation.      Objective:  BP 124/60   Pulse 94   Ht 5' 4 (1.626 m)   Wt 170 lb 3.2 oz (77.2 kg)   SpO2 99%   BMI 29.21 kg/m   BP Readings from Last 3 Encounters:  07/08/23 124/60  07/01/23 104/68  06/24/23 110/65    Wt Readings from Last 3 Encounters:  07/08/23 170 lb 3.2  oz (77.2 kg)  07/01/23 169 lb 14.4 oz (77.1 kg)  06/17/23 165 lb (74.8 kg)    Physical Exam Vitals reviewed.  Constitutional:      General: She is not in acute distress.    Appearance: Normal appearance. She is normal weight. She is not ill-appearing, toxic-appearing or diaphoretic.  HENT:     Head: Normocephalic.  Eyes:     General: No scleral icterus.       Right eye: No discharge.  Left eye: No discharge.     Conjunctiva/sclera: Conjunctivae normal.  Cardiovascular:     Rate and Rhythm: Normal rate and regular rhythm.     Heart sounds: Normal heart sounds.  Pulmonary:     Effort: Pulmonary effort is normal. No respiratory distress.     Breath sounds: Normal breath sounds.  Musculoskeletal:        General: Normal range of motion.  Skin:    General: Skin is warm and dry.  Neurological:     General: No focal deficit present.     Mental Status: She is alert and oriented to person, place, and time. Mental status is at baseline.  Psychiatric:        Mood and Affect: Mood normal.        Behavior: Behavior normal.        Thought Content: Thought content normal.        Judgment: Judgment normal.     Lab Results  Component Value Date   HGBA1C 7.8 (H) 07/06/2023   HGBA1C 7.4 (H) 02/09/2023   HGBA1C 7.2 (H) 11/17/2022    Lab Results  Component Value Date   CREATININE 0.57 07/06/2023   CREATININE 0.56 07/01/2023   CREATININE 0.51 06/24/2023    Lab Results  Component Value Date   WBC 6.8 07/01/2023   HGB 11.1 (L) 07/01/2023   HCT 35.6 (L) 07/01/2023   PLT 199 07/01/2023   GLUCOSE 151 (H) 07/06/2023   CHOL 136 11/17/2022   TRIG 125.0 11/17/2022   HDL 64.10 11/17/2022   LDLDIRECT 53.0 11/17/2022   LDLCALC 47 11/17/2022   ALT 3 07/06/2023   AST 12 07/06/2023   NA 139 07/06/2023   K 4.0 07/06/2023   CL 106 07/06/2023   CREATININE 0.57 07/06/2023   BUN 12 07/06/2023   CO2 24 07/06/2023   TSH 2.506 06/18/2023   INR 1.2 06/18/2023   HGBA1C 7.8 (H)  07/06/2023   MICROALBUR <0.7 03/10/2023    ECHO TEE Result Date: 06/24/2023    TRANSESOPHOGEAL ECHO REPORT   Patient Name:   Kathleen Russell Date of Exam: 06/24/2023 Medical Rec #:  [id number]        Height:       64.0 in Accession #:    [id number]       Weight:       165.0 lb Date of Birth:  1955-05-24         BSA:          1.803 m Patient Age:    67 years         BP:           96/60 mmHg Patient Gender: F                HR:           85 bpm. Exam Location:  [facility] Procedure: Transesophageal Echo, Cardiac Doppler, Color Doppler and Saline            Contrast Bubble Study (Both Spectral and Color Flow Doppler were            utilized during procedure). Indications:     Not listed on TEE check-in sheet.  History:         Patient has prior history of Echocardiogram examinations, most                  recent 06/19/2023. Risk Factors:Diabetes.  Sonographer:     Christopher Furnace Referring Phys:  [id number] CHRISTOPHER  RONALD BERGE Diagnosing Phys: Timothy Gollan MD PROCEDURE: After discussion of the risks and benefits of a TEE, an informed consent was obtained from the patient. TEE procedure time was 30 minutes. The transesophogeal probe was passed without difficulty through the esophogus of the patient. Imaged were obtained with the patient in a left lateral decubitus position. Local oropharyngeal anesthetic was provided with Cetacaine  and viscous lidocaine . Sedation performed by performing physician. Patients was under conscious sedation during this procedure. Anesthetic administered: 75mcg of Fentanyl , 3mg  of Versed . Image quality was excellent. The patient's vital signs; including heart rate, blood pressure, and oxygen saturation; remained stable throughout the procedure. The patient developed no complications during the procedure.  IMPRESSIONS  1. No valve vegetation noted.  2. Left ventricular ejection fraction, by estimation, is 60 to 65%. The left ventricle has normal function. The left ventricle has no regional wall motion  abnormalities.  3. Right ventricular systolic function is normal. The right ventricular size is normal.  4. No left atrial/left atrial appendage thrombus was detected.  5. The mitral valve is normal in structure. Mild mitral valve regurgitation. No evidence of mitral stenosis.  6. The aortic valve is tricuspid. Aortic valve regurgitation is not visualized. No aortic stenosis is present.  7. The inferior vena cava is normal in size with greater than 50% respiratory variability, suggesting right atrial pressure of 3 mmHg.  8. Agitated saline contrast bubble study was negative, with no evidence of any interatrial shunt. Conclusion(s)/Recommendation(s): Normal biventricular function without evidence of hemodynamically significant valvular heart disease. FINDINGS  Left Ventricle: Left ventricular ejection fraction, by estimation, is 60 to 65%. The left ventricle has normal function. The left ventricle has no regional wall motion abnormalities. The left ventricular internal cavity size was normal in size. There is  no left ventricular hypertrophy. Right Ventricle: The right ventricular size is normal. No increase in right ventricular wall thickness. Right ventricular systolic function is normal. Left Atrium: Left atrial size was normal in size. No left atrial/left atrial appendage thrombus was detected. Right Atrium: Right atrial size was normal in size. Pericardium: There is no evidence of pericardial effusion. Mitral Valve: The mitral valve is normal in structure. Mild mitral valve regurgitation. No evidence of mitral valve stenosis. There is no evidence of mitral valve vegetation. Tricuspid Valve: The tricuspid valve is normal in structure. Tricuspid valve regurgitation is not demonstrated. No evidence of tricuspid stenosis. There is no evidence of tricuspid valve vegetation. Aortic Valve: The aortic valve is tricuspid. Aortic valve regurgitation is not visualized. No aortic stenosis is present. There is no evidence of  aortic valve vegetation. Pulmonic Valve: The pulmonic valve was normal in structure. Pulmonic valve regurgitation is not visualized. No evidence of pulmonic stenosis. There is no evidence of pulmonic valve vegetation. Aorta: The aortic root is normal in size and structure. Venous: The inferior vena cava is normal in size with greater than 50% respiratory variability, suggesting right atrial pressure of 3 mmHg. IAS/Shunts: No atrial level shunt detected by color flow Doppler. Agitated saline contrast was given intravenously to evaluate for intracardiac shunting. Agitated saline contrast bubble study was negative, with no evidence of any interatrial shunt. There  is no evidence of a patent foramen ovale. There is no evidence of an atrial septal defect. Additional Comments: 3D was performed not requiring image post processing on an independent workstation and was indeterminate. Evalene Lunger MD Electronically signed by Evalene Lunger MD Signature Date/Time: 06/24/2023/3:28:14 PM    Final    US  EKG  SITE RITE Result Date: 06/24/2023 If Site Rite image not attached, placement could not be confirmed due to current cardiac rhythm.   Assessment & Plan:  .Type 2 diabetes mellitus with obesity (HCC) Assessment & Plan: Continue 15 units of lantus  and 25 mg januvia .  Will likely need to add novolog  once chemotherapy starts . Continue use of CBG monitor   Orders: -     Comprehensive metabolic panel with GFR; Future -     Hemoglobin A1c; Future  Hyperlipidemia LDL goal <70 Assessment & Plan: LDL is at goal on atorvastatin . No changes today  Lab Results  Component Value Date   CHOL 136 11/17/2022   HDL 64.10 11/17/2022   LDLCALC 47 11/17/2022   LDLDIRECT 53.0 11/17/2022   TRIG 125.0 11/17/2022   CHOLHDL 2 11/17/2022   Lab Results  Component Value Date   ALT 3 07/06/2023   AST 12 07/06/2023   ALKPHOS 56 07/06/2023   BILITOT 0.3 07/06/2023     Orders: -     Lipid panel; Future -     LDL  cholesterol, direct; Future  Malignant neoplasm of pancreas, unspecified location of malignancy [facility]) Assessment & Plan: She has had an interruption of planned chemotherapy regimen due to MSSA bacteremia from portacath.  She is now receiving abx via PICC line in RUE through July 11      In additional to review of CBG monitor,  I spent 34 minutes on the day of this face to face encounter reviewing patient's  most recent  hospitalization, most recent visit with oncology,  prior relevant surgical and non surgical procedures, recent  labs and imaging studies,  reviewing the assessment and plan with patient, and post visit ordering and reviewing of  diagnostics and therapeutics with patient  .   Follow-up: Return in about 3 months (around 10/08/2023) for follow up diabetes.   Verneita LITTIE Kettering, MD

## 2023-07-09 ENCOUNTER — Other Ambulatory Visit: Payer: Self-pay

## 2023-07-09 ENCOUNTER — Encounter: Payer: Self-pay | Admitting: Internal Medicine

## 2023-07-09 NOTE — Assessment & Plan Note (Signed)
 LDL is at goal on atorvastatin . No changes today  Lab Results  Component Value Date   CHOL 136 11/17/2022   HDL 64.10 11/17/2022   LDLCALC 47 11/17/2022   LDLDIRECT 53.0 11/17/2022   TRIG 125.0 11/17/2022   CHOLHDL 2 11/17/2022   Lab Results  Component Value Date   ALT 3 07/06/2023   AST 12 07/06/2023   ALKPHOS 56 07/06/2023   BILITOT 0.3 07/06/2023

## 2023-07-09 NOTE — Assessment & Plan Note (Signed)
 Continue 15 units of lantus  and 25 mg januvia .  Will likely need to add novolog  once chemotherapy starts . Continue use of CBG monitor

## 2023-07-09 NOTE — Assessment & Plan Note (Signed)
 She has had an interruption of planned chemotherapy regimen due to MSSA bacteremia from portacath.  She is now receiving abx via PICC line in RUE through July 11

## 2023-07-14 DIAGNOSIS — B9561 Methicillin susceptible Staphylococcus aureus infection as the cause of diseases classified elsewhere: Secondary | ICD-10-CM | POA: Diagnosis not present

## 2023-07-14 DIAGNOSIS — C259 Malignant neoplasm of pancreas, unspecified: Secondary | ICD-10-CM | POA: Diagnosis not present

## 2023-07-14 DIAGNOSIS — R7881 Bacteremia: Secondary | ICD-10-CM | POA: Diagnosis not present

## 2023-07-14 DIAGNOSIS — Z792 Long term (current) use of antibiotics: Secondary | ICD-10-CM | POA: Diagnosis not present

## 2023-07-14 DIAGNOSIS — A4101 Sepsis due to Methicillin susceptible Staphylococcus aureus: Secondary | ICD-10-CM | POA: Diagnosis not present

## 2023-07-15 DIAGNOSIS — E119 Type 2 diabetes mellitus without complications: Secondary | ICD-10-CM | POA: Diagnosis not present

## 2023-07-15 DIAGNOSIS — C259 Malignant neoplasm of pancreas, unspecified: Secondary | ICD-10-CM | POA: Diagnosis not present

## 2023-07-15 DIAGNOSIS — R7881 Bacteremia: Secondary | ICD-10-CM | POA: Diagnosis not present

## 2023-07-20 ENCOUNTER — Inpatient Hospital Stay: Attending: Oncology | Admitting: Oncology

## 2023-07-20 ENCOUNTER — Other Ambulatory Visit (HOSPITAL_COMMUNITY): Payer: Self-pay | Admitting: Student

## 2023-07-20 ENCOUNTER — Encounter: Payer: Self-pay | Admitting: Oncology

## 2023-07-20 VITALS — BP 124/86 | HR 93 | Temp 97.8°F | Resp 18 | Ht 64.0 in | Wt 174.0 lb

## 2023-07-20 DIAGNOSIS — Z452 Encounter for adjustment and management of vascular access device: Secondary | ICD-10-CM | POA: Insufficient documentation

## 2023-07-20 DIAGNOSIS — C252 Malignant neoplasm of tail of pancreas: Secondary | ICD-10-CM | POA: Insufficient documentation

## 2023-07-20 DIAGNOSIS — D649 Anemia, unspecified: Secondary | ICD-10-CM | POA: Diagnosis not present

## 2023-07-20 DIAGNOSIS — Z5111 Encounter for antineoplastic chemotherapy: Secondary | ICD-10-CM | POA: Insufficient documentation

## 2023-07-20 DIAGNOSIS — Z5189 Encounter for other specified aftercare: Secondary | ICD-10-CM | POA: Diagnosis not present

## 2023-07-20 NOTE — Progress Notes (Unsigned)
 Discuss wanting to start back on chemo.

## 2023-07-21 ENCOUNTER — Encounter: Payer: Self-pay | Admitting: Oncology

## 2023-07-21 ENCOUNTER — Other Ambulatory Visit: Payer: Self-pay | Admitting: Diagnostic Radiology

## 2023-07-21 NOTE — Progress Notes (Signed)
 Patient for IR Port insertion on Wed 07/22/23, I called and spoke with the patient on the phone and gave pre-procedure instructions. Pt was made aware to be here at 10a, NPO after MN prior to procedure as well as driver post procedure/recovery/discharge. Pt stated understanding.  Called 07/21/23
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
## 2023-07-22 ENCOUNTER — Encounter: Payer: Self-pay | Admitting: Oncology

## 2023-07-22 ENCOUNTER — Ambulatory Visit
Admission: RE | Admit: 2023-07-22 | Discharge: 2023-07-22 | Disposition: A | Discharge status: Home / Self Care | Source: Ambulatory Visit | Attending: Oncology | Admitting: Oncology

## 2023-07-22 ENCOUNTER — Encounter: Payer: Self-pay | Admitting: Radiology

## 2023-07-22 DIAGNOSIS — Z7984 Long term (current) use of oral hypoglycemic drugs: Secondary | ICD-10-CM | POA: Diagnosis not present

## 2023-07-22 DIAGNOSIS — E1169 Type 2 diabetes mellitus with other specified complication: Secondary | ICD-10-CM | POA: Diagnosis not present

## 2023-07-22 DIAGNOSIS — C259 Malignant neoplasm of pancreas, unspecified: Secondary | ICD-10-CM | POA: Insufficient documentation

## 2023-07-22 DIAGNOSIS — C252 Malignant neoplasm of tail of pancreas: Secondary | ICD-10-CM

## 2023-07-22 DIAGNOSIS — Z794 Long term (current) use of insulin: Secondary | ICD-10-CM | POA: Insufficient documentation

## 2023-07-22 HISTORY — PX: IR IMAGING GUIDED PORT INSERTION: IMG5740

## 2023-07-22 LAB — GLUCOSE, CAPILLARY
Glucose-Capillary: 126 mg/dL — ABNORMAL HIGH (ref 70–99)
Glucose-Capillary: 104 mg/dL — ABNORMAL HIGH (ref 70–99)

## 2023-07-22 MED ORDER — LIDOCAINE-EPINEPHRINE 1 %-1:100000 IJ SOLN
INTRAMUSCULAR | Status: AC
Start: 2023-07-22 — End: 2023-07-22
  Filled 2023-07-22: qty 1

## 2023-07-22 MED ORDER — FENTANYL CITRATE (PF) 100 MCG/2ML IJ SOLN
INTRAMUSCULAR | Status: AC | PRN
  Administered 2023-07-22 (×2): 25 ug via INTRAVENOUS
  Administered 2023-07-22: 50 ug via INTRAVENOUS

## 2023-07-22 MED ORDER — FENTANYL CITRATE (PF) 100 MCG/2ML IJ SOLN
INTRAMUSCULAR | Status: AC
  Filled 2023-07-22: qty 2

## 2023-07-22 MED ORDER — HEPARIN SOD (PORK) LOCK FLUSH 100 UNIT/ML IV SOLN
500.0000 [IU] | Freq: Once | INTRAVENOUS | Status: AC
  Administered 2023-07-22: 500 [IU] via INTRAVENOUS

## 2023-07-22 MED ORDER — LIDOCAINE-EPINEPHRINE 1 %-1:100000 IJ SOLN
20.0000 mL | Freq: Once | INTRAMUSCULAR | Status: AC
  Administered 2023-07-22: 10 mL via INTRADERMAL

## 2023-07-22 MED ORDER — MIDAZOLAM HCL 2 MG/2ML IJ SOLN
INTRAMUSCULAR | Status: AC
  Filled 2023-07-22: qty 4

## 2023-07-22 MED ORDER — HEPARIN SOD (PORK) LOCK FLUSH 100 UNIT/ML IV SOLN
INTRAVENOUS | Status: AC
  Filled 2023-07-22: qty 5

## 2023-07-22 MED ORDER — SODIUM CHLORIDE 0.9 % IV SOLN: INTRAVENOUS | Status: DC

## 2023-07-22 MED ORDER — MIDAZOLAM HCL 2 MG/2ML IJ SOLN
INTRAMUSCULAR | Status: AC | PRN
  Administered 2023-07-22: 1 mg via INTRAVENOUS
  Administered 2023-07-22: .5 mg via INTRAVENOUS

## 2023-07-22 NOTE — Progress Notes (Signed)
 Phase 2 complete 1330.
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
## 2023-07-22 NOTE — Telephone Encounter (Signed)
 Port Placement scheduled today 07/22/23 at 11am.  Scheduled to see Lauren on 07/27/23.

## 2023-07-22 NOTE — Procedures (Signed)
 Interventional Radiology Procedure Note  Procedure: LT EJ POWER PORT    Complications: None  Estimated Blood Loss:  MIN  Findings: TIP SVCRA    EMERSON FREDERIC SPECKING, MD

## 2023-07-23 ENCOUNTER — Encounter: Payer: Self-pay | Admitting: Oncology

## 2023-07-24 MED FILL — Fosaprepitant Dimeglumine For IV Infusion 150 MG (Base Eq): INTRAVENOUS | Qty: 5 | Status: AC

## 2023-07-27 ENCOUNTER — Inpatient Hospital Stay: Admitting: Nurse Practitioner

## 2023-07-27 ENCOUNTER — Other Ambulatory Visit: Payer: Self-pay

## 2023-07-27 ENCOUNTER — Inpatient Hospital Stay

## 2023-07-27 ENCOUNTER — Ambulatory Visit: Admitting: Oncology

## 2023-07-27 ENCOUNTER — Other Ambulatory Visit

## 2023-07-27 ENCOUNTER — Encounter: Payer: Self-pay | Admitting: Nurse Practitioner

## 2023-07-27 VITALS — BP 119/71 | HR 89 | Temp 98.0°F | Resp 18 | Ht 64.0 in | Wt 175.6 lb

## 2023-07-27 DIAGNOSIS — C252 Malignant neoplasm of tail of pancreas: Secondary | ICD-10-CM

## 2023-07-27 DIAGNOSIS — Z5111 Encounter for antineoplastic chemotherapy: Secondary | ICD-10-CM | POA: Diagnosis not present

## 2023-07-27 DIAGNOSIS — C259 Malignant neoplasm of pancreas, unspecified: Secondary | ICD-10-CM | POA: Diagnosis not present

## 2023-07-27 LAB — CMP (CANCER CENTER ONLY)
ALT: 8 U/L (ref 0–44)
AST: 15 U/L (ref 15–41)
Albumin: 3.7 g/dL (ref 3.5–5.0)
Alkaline Phosphatase: 54 U/L (ref 38–126)
Anion gap: 6 (ref 5–15)
BUN: 14 mg/dL (ref 8–23)
CO2: 24 mmol/L (ref 22–32)
Calcium: 8.8 mg/dL — ABNORMAL LOW (ref 8.9–10.3)
Chloride: 107 mmol/L (ref 98–111)
Creatinine: 0.52 mg/dL (ref 0.44–1.00)
GFR, Estimated: >60 mL/min (ref >60)
Glucose, Bld: 147 mg/dL — ABNORMAL HIGH (ref 70–99)
Potassium: 3.9 mmol/L (ref 3.5–5.1)
Sodium: 137 mmol/L (ref 135–145)
Total Bilirubin: 0.5 mg/dL (ref 0.0–1.2)
Total Protein: 6.6 g/dL (ref 6.5–8.1)

## 2023-07-27 LAB — CBC WITH DIFFERENTIAL (CANCER CENTER ONLY)
Abs Immature Granulocytes: 0.01 K/uL (ref 0.00–0.07)
Basophils Absolute: 0 K/uL (ref 0.0–0.1)
Basophils Relative: 0 %
Eosinophils Absolute: 0.1 K/uL (ref 0.0–0.5)
Eosinophils Relative: 2 %
HCT: 36.3 % (ref 36.0–46.0)
Hemoglobin: 11.7 g/dL — ABNORMAL LOW (ref 12.0–15.0)
Immature Granulocytes: 0 %
Lymphocytes Relative: 36 %
Lymphs Abs: 1.9 K/uL (ref 0.7–4.0)
MCH: 28.7 pg (ref 26.0–34.0)
MCHC: 32.2 g/dL (ref 30.0–36.0)
MCV: 89 fL (ref 80.0–100.0)
Monocytes Absolute: 0.3 K/uL (ref 0.1–1.0)
Monocytes Relative: 6 %
Neutro Abs: 2.9 K/uL (ref 1.7–7.7)
Neutrophils Relative %: 56 %
Platelet Count: 145 K/uL — ABNORMAL LOW (ref 150–400)
RBC: 4.08 MIL/uL (ref 3.87–5.11)
RDW: 14.1 % (ref 11.5–15.5)
WBC Count: 5.2 K/uL (ref 4.0–10.5)
nRBC: 0 % (ref 0.0–0.2)

## 2023-07-27 MED ORDER — DEXTROSE 5 % IV SOLN
INTRAVENOUS | Status: DC
  Filled 2023-07-27: qty 250

## 2023-07-27 MED ORDER — SODIUM CHLORIDE 0.9 % IV SOLN
4500.0000 mg | INTRAVENOUS | Status: DC
  Administered 2023-07-27: 4500 mg via INTRAVENOUS
  Filled 2023-07-27: qty 90

## 2023-07-27 MED ORDER — STERILE WATER FOR INJECTION IJ SOLN
5.0000 mL | Freq: Four times a day (QID) | OROMUCOSAL | 3 refills | Status: DC | PRN
  Filled 2023-07-27: qty 480, 12d supply, fill #0

## 2023-07-27 MED ORDER — PALONOSETRON HCL INJECTION 0.25 MG/5ML
0.2500 mg | Freq: Once | INTRAVENOUS | Status: AC
  Administered 2023-07-27: 0.25 mg via INTRAVENOUS
  Filled 2023-07-27: qty 5

## 2023-07-27 MED ORDER — ATROPINE SULFATE 1 MG/ML IV SOLN
0.5000 mg | Freq: Once | INTRAVENOUS | Status: AC | PRN
  Administered 2023-07-27: 0.5 mg via INTRAVENOUS
  Filled 2023-07-27: qty 1

## 2023-07-27 MED ORDER — OXALIPLATIN CHEMO INJECTION 100 MG/20ML
65.0000 mg/m2 | Freq: Once | INTRAVENOUS | Status: AC
  Administered 2023-07-27: 125 mg via INTRAVENOUS
  Filled 2023-07-27: qty 16.01

## 2023-07-27 MED ORDER — SODIUM CHLORIDE 0.9 % IV SOLN
150.0000 mg | Freq: Once | INTRAVENOUS | Status: AC
  Administered 2023-07-27: 150 mg via INTRAVENOUS
  Filled 2023-07-27: qty 5
  Filled 2023-07-27: qty 150

## 2023-07-27 MED ORDER — DEXAMETHASONE SODIUM PHOSPHATE 10 MG/ML IJ SOLN
10.0000 mg | Freq: Once | INTRAMUSCULAR | Status: AC
  Administered 2023-07-27: 10 mg via INTRAVENOUS
  Filled 2023-07-27: qty 1

## 2023-07-27 MED ORDER — SODIUM CHLORIDE 0.9 % IV SOLN
400.0000 mg/m2 | Freq: Once | INTRAVENOUS | Status: AC
  Administered 2023-07-27: 760 mg via INTRAVENOUS
  Filled 2023-07-27: qty 25

## 2023-07-27 MED ORDER — SODIUM CHLORIDE 0.9 % IV SOLN
150.0000 mg/m2 | Freq: Once | INTRAVENOUS | Status: AC
  Administered 2023-07-27: 300 mg via INTRAVENOUS
  Filled 2023-07-27: qty 15

## 2023-07-27 MED ORDER — SODIUM CHLORIDE 0.9 % IV SOLN
INTRAVENOUS | Status: DC
  Filled 2023-07-27: qty 250
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
## 2023-07-27 NOTE — Progress Notes (Signed)
 Patient had her port placed on 07/22/2023. She is wanting a refill on the magic mouthwash.   Her grandchildren are coming home today and she would like to know if it is ok to be in contact with the younger ones due the chemo?   NP Tinnie Dawn signed the Magic mouthwash prescription and I just faxed it over to the [facility].

## 2023-07-28 ENCOUNTER — Other Ambulatory Visit: Payer: Self-pay

## 2023-07-29 ENCOUNTER — Inpatient Hospital Stay

## 2023-07-29 VITALS — BP 135/68 | HR 87 | Temp 97.0°F | Resp 18

## 2023-07-29 DIAGNOSIS — C252 Malignant neoplasm of tail of pancreas: Secondary | ICD-10-CM

## 2023-07-29 DIAGNOSIS — Z5111 Encounter for antineoplastic chemotherapy: Secondary | ICD-10-CM | POA: Diagnosis not present

## 2023-07-29 MED ORDER — HEPARIN SOD (PORK) LOCK FLUSH 100 UNIT/ML IV SOLN
500.0000 [IU] | Freq: Once | INTRAVENOUS | Status: AC | PRN
  Administered 2023-07-29: 500 [IU]
  Filled 2023-07-29: qty 5

## 2023-07-29 MED ORDER — PEGFILGRASTIM-CBQV 6 MG/0.6ML ~~LOC~~ SOSY
6.0000 mg | PREFILLED_SYRINGE | Freq: Once | SUBCUTANEOUS | Status: AC
  Administered 2023-07-29: 6 mg via SUBCUTANEOUS
  Filled 2023-07-29: qty 0.6

## 2023-07-29 MED ORDER — SODIUM CHLORIDE 0.9% FLUSH
10.0000 mL | INTRAVENOUS | Status: DC | PRN
  Administered 2023-07-29: 10 mL
  Filled 2023-07-29: qty 10

## 2023-07-30 ENCOUNTER — Encounter: Payer: Self-pay | Admitting: Internal Medicine

## 2023-07-30 ENCOUNTER — Inpatient Hospital Stay

## 2023-07-30 NOTE — Progress Notes (Signed)
 Nutrition Follow-up:  Patient with pancreatic cancer.  Patient receiving modified folfirinox.  Noted hospital admission for MSSA bacteremia from port.    Spoke with patient via phone for nutrition follow-up.  Patient reports that her appetite has been good.  Started chemotherapy back on 7/21.  For breakfast this am ate scrambled eggs, crescent roll.  Planning lunch of hamburger and pasta salad.  Has drank Fairlife shake  and chobani yogurt for extra calories and protein.  Says that ensure shakes upset her stomach.  She and family/friends have prepared meals and snacks for her to easily fix.  Feels like she is doing well nutritionally    Medications: reviewed  Labs: reviewed  Anthropometrics:   Weight 175 lb 9.6 oz on 7/21 171 lb on 5/30 181 lb on 3/4   NUTRITION DIAGNOSIS: Inadequate oral intake stable    INTERVENTION:  Continue high calorie, high protein foods to help maintain weight during treatment Discuss additional oral nutrition supplement shakes to try (orgain, premier protein)    MONITORING, EVALUATION, GOAL: weight trends, intake   NEXT VISIT: Thursday, August 21 phone call  Cope Marte B. Dasie SOLON, CSO, LDN Registered Dietitian [phone]

## 2023-08-03 ENCOUNTER — Telehealth: Admitting: Internal Medicine

## 2023-08-03 ENCOUNTER — Encounter: Payer: Self-pay | Admitting: Internal Medicine

## 2023-08-03 ENCOUNTER — Encounter: Payer: Self-pay | Admitting: Oncology

## 2023-08-03 VITALS — Ht 64.0 in | Wt 175.6 lb

## 2023-08-03 DIAGNOSIS — E1169 Type 2 diabetes mellitus with other specified complication: Secondary | ICD-10-CM | POA: Diagnosis not present

## 2023-08-03 DIAGNOSIS — E669 Obesity, unspecified: Secondary | ICD-10-CM | POA: Diagnosis not present

## 2023-08-03 DIAGNOSIS — Z794 Long term (current) use of insulin: Secondary | ICD-10-CM
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
## 2023-08-03 NOTE — Patient Instructions (Addendum)
 You may take an additional 3 units of Lantus  for persistent hyperglycemia > 200  Take NO LANTUS  unitl you are eating,  and start back at 15 units  unless your sugars are > 200 (then ok to take 18 units )

## 2023-08-04 NOTE — Assessment & Plan Note (Addendum)
 After reviewing her CBG data,  I have advised her to continue 18 units of lantus  as long as her appetite is intact but to rsuspend insulin  on days she has reduced appetite and resume at 15 units  once /if CBGS are > 200 and increase by 3 units if needed for sugars that continue to remain > 200

## 2023-08-07 MED FILL — Fosaprepitant Dimeglumine For IV Infusion 150 MG (Base Eq): INTRAVENOUS | Qty: 5 | Status: AC

## 2023-08-08 DIAGNOSIS — C259 Malignant neoplasm of pancreas, unspecified: Secondary | ICD-10-CM | POA: Diagnosis not present

## 2023-08-08 DIAGNOSIS — C252 Malignant neoplasm of tail of pancreas: Secondary | ICD-10-CM | POA: Diagnosis not present

## 2023-08-10 ENCOUNTER — Inpatient Hospital Stay: Admitting: Oncology

## 2023-08-10 ENCOUNTER — Encounter: Payer: Self-pay | Admitting: Oncology

## 2023-08-10 ENCOUNTER — Other Ambulatory Visit: Payer: Self-pay

## 2023-08-10 ENCOUNTER — Inpatient Hospital Stay

## 2023-08-10 ENCOUNTER — Inpatient Hospital Stay: Attending: Oncology

## 2023-08-10 VITALS — BP 114/63 | HR 102 | Temp 96.2°F | Resp 19 | Ht 64.0 in | Wt 175.2 lb

## 2023-08-10 DIAGNOSIS — Z5189 Encounter for other specified aftercare: Secondary | ICD-10-CM | POA: Insufficient documentation

## 2023-08-10 DIAGNOSIS — Z5111 Encounter for antineoplastic chemotherapy: Secondary | ICD-10-CM | POA: Insufficient documentation

## 2023-08-10 DIAGNOSIS — C259 Malignant neoplasm of pancreas, unspecified: Secondary | ICD-10-CM | POA: Diagnosis not present

## 2023-08-10 DIAGNOSIS — C252 Malignant neoplasm of tail of pancreas: Secondary | ICD-10-CM

## 2023-08-10 LAB — CMP (CANCER CENTER ONLY)
ALT: 11 U/L (ref 0–44)
AST: 15 U/L (ref 15–41)
Albumin: 3.8 g/dL (ref 3.5–5.0)
Alkaline Phosphatase: 92 U/L (ref 38–126)
Anion gap: 7 (ref 5–15)
BUN: 18 mg/dL (ref 8–23)
CO2: 24 mmol/L (ref 22–32)
Calcium: 8.5 mg/dL — ABNORMAL LOW (ref 8.9–10.3)
Chloride: 105 mmol/L (ref 98–111)
Creatinine: 0.65 mg/dL (ref 0.44–1.00)
GFR, Estimated: >60 mL/min (ref >60)
Glucose, Bld: 163 mg/dL — ABNORMAL HIGH (ref 70–99)
Potassium: 3.7 mmol/L (ref 3.5–5.1)
Sodium: 136 mmol/L (ref 135–145)
Total Bilirubin: 0.4 mg/dL (ref 0.0–1.2)
Total Protein: 6.6 g/dL (ref 6.5–8.1)

## 2023-08-10 LAB — CBC WITH DIFFERENTIAL (CANCER CENTER ONLY)
Abs Immature Granulocytes: 0.1 K/uL — ABNORMAL HIGH (ref 0.00–0.07)
Basophils Absolute: 0.1 K/uL (ref 0.0–0.1)
Basophils Relative: 1 %
Eosinophils Absolute: 0.1 K/uL (ref 0.0–0.5)
Eosinophils Relative: 1 %
HCT: 36.4 % (ref 36.0–46.0)
Hemoglobin: 11.8 g/dL — ABNORMAL LOW (ref 12.0–15.0)
Immature Granulocytes: 1 %
Lymphocytes Relative: 28 %
Lymphs Abs: 2.2 K/uL (ref 0.7–4.0)
MCH: 28.4 pg (ref 26.0–34.0)
MCHC: 32.4 g/dL (ref 30.0–36.0)
MCV: 87.5 fL (ref 80.0–100.0)
Monocytes Absolute: 0.5 K/uL (ref 0.1–1.0)
Monocytes Relative: 6 %
Neutro Abs: 4.9 K/uL (ref 1.7–7.7)
Neutrophils Relative %: 63 %
Platelet Count: 163 K/uL (ref 150–400)
RBC: 4.16 MIL/uL (ref 3.87–5.11)
RDW: 14.2 % (ref 11.5–15.5)
WBC Count: 7.9 K/uL (ref 4.0–10.5)
nRBC: 0 % (ref 0.0–0.2)

## 2023-08-10 MED ORDER — DEXAMETHASONE SODIUM PHOSPHATE 10 MG/ML IJ SOLN
10.0000 mg | Freq: Once | INTRAMUSCULAR | Status: AC
  Administered 2023-08-10: 10 mg via INTRAVENOUS
  Filled 2023-08-10: qty 1

## 2023-08-10 MED ORDER — SODIUM CHLORIDE 0.9 % IV SOLN
4500.0000 mg | INTRAVENOUS | Status: DC
  Administered 2023-08-10: 4500 mg via INTRAVENOUS
  Filled 2023-08-10: qty 90

## 2023-08-10 MED ORDER — STERILE WATER FOR INJECTION IJ SOLN
5.0000 mL | Freq: Four times a day (QID) | OROMUCOSAL | 3 refills | Status: DC
  Filled 2023-08-10: qty 480, 12d supply, fill #0
  Filled 2023-09-07: qty 480, 12d supply, fill #1

## 2023-08-10 MED ORDER — STERILE WATER FOR INJECTION IJ SOLN: 5.0000 mL | Freq: Four times a day (QID) | OROMUCOSAL | 3 refills | Status: DC | PRN

## 2023-08-10 MED ORDER — DEXTROSE 5 % IV SOLN
INTRAVENOUS | Status: DC
Start: 2023-08-10 — End: 2023-08-10
  Filled 2023-08-10: qty 250

## 2023-08-10 MED ORDER — OXALIPLATIN CHEMO INJECTION 100 MG/20ML
65.0000 mg/m2 | Freq: Once | INTRAVENOUS | Status: AC
  Administered 2023-08-10: 125 mg via INTRAVENOUS
  Filled 2023-08-10: qty 25

## 2023-08-10 MED ORDER — PALONOSETRON HCL INJECTION 0.25 MG/5ML
0.2500 mg | Freq: Once | INTRAVENOUS | Status: AC
  Administered 2023-08-10: 0.25 mg via INTRAVENOUS
  Filled 2023-08-10: qty 5

## 2023-08-10 MED ORDER — FOSAPREPITANT DIMEGLUMINE INJECTION 150 MG
150.0000 mg | Freq: Once | INTRAVENOUS | Status: AC
  Administered 2023-08-10: 150 mg via INTRAVENOUS
  Filled 2023-08-10: qty 150

## 2023-08-10 MED ORDER — ATROPINE SULFATE 1 MG/ML IV SOLN
0.5000 mg | Freq: Once | INTRAVENOUS | Status: AC | PRN
  Administered 2023-08-10: 0.5 mg via INTRAVENOUS
  Filled 2023-08-10: qty 1

## 2023-08-10 MED ORDER — SODIUM CHLORIDE 0.9 % IV SOLN
400.0000 mg/m2 | Freq: Once | INTRAVENOUS | Status: AC
  Administered 2023-08-10: 760 mg via INTRAVENOUS
  Filled 2023-08-10: qty 25

## 2023-08-10 MED ORDER — SODIUM CHLORIDE 0.9 % IV SOLN
150.0000 mg/m2 | Freq: Once | INTRAVENOUS | Status: AC
  Administered 2023-08-10: 300 mg via INTRAVENOUS
  Filled 2023-08-10: qty 15
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
## 2023-08-10 NOTE — Patient Instructions (Signed)
 [facility] - A DEPT OF [facility]  Discharge Instructions: Thank you for choosing [facility] to provide your oncology and hematology care.  If you have a lab appointment with the Cancer Center, please go directly to the Cancer Center and check in at the registration area.  Wear comfortable clothing and clothing appropriate for easy access to any Portacath or PICC line.   We strive to give you quality time with your provider. You may need to reschedule your appointment if you arrive late (15 or more minutes).  Arriving late affects you and other patients whose appointments are after yours.  Also, if you miss three or more appointments without notifying the office, you may be dismissed from the clinic at the provider's discretion.      For prescription refill requests, have your pharmacy contact our office and allow 72 hours for refills to be completed.    Today you received the following chemotherapy and/or immunotherapy agents oxaliplatin , leucovorin , irinotecan , and adrucil       To help prevent nausea and vomiting after your treatment, we encourage you to take your nausea medication as directed.  BELOW ARE SYMPTOMS THAT SHOULD BE REPORTED IMMEDIATELY: *FEVER GREATER THAN 100.4 F (38 C) OR HIGHER *CHILLS OR SWEATING *NAUSEA AND VOMITING THAT IS NOT CONTROLLED WITH YOUR NAUSEA MEDICATION *UNUSUAL SHORTNESS OF BREATH *UNUSUAL BRUISING OR BLEEDING *URINARY PROBLEMS (pain or burning when urinating, or frequent urination) *BOWEL PROBLEMS (unusual diarrhea, constipation, pain near the anus) TENDERNESS IN MOUTH AND THROAT WITH OR WITHOUT PRESENCE OF ULCERS (sore throat, sores in mouth, or a toothache) UNUSUAL RASH, SWELLING OR PAIN  UNUSUAL VAGINAL DISCHARGE OR ITCHING   Items with * indicate a potential emergency and should be followed up as soon as possible or go to the Emergency Department if any problems should occur.  Please show the  CHEMOTHERAPY ALERT CARD or IMMUNOTHERAPY ALERT CARD at check-in to the Emergency Department and triage nurse.  Should you have questions after your visit or need to cancel or reschedule your appointment, please contact [facility] - A DEPT OF [facility]  [phone] and follow the prompts.  Office hours are 8:00 a.m. to 4:30 p.m. Monday - Friday. Please note that voicemails left after 4:00 p.m. may not be returned until the following business day.  We are closed weekends and major holidays. You have access to a nurse at all times for urgent questions. Please call the main number to the clinic [phone] and follow the prompts.  For any non-urgent questions, you may also contact your provider using MyChart. We now offer e-Visits for anyone 37 and older to request care online for non-urgent symptoms. For details visit [email or web address].   Also download the MyChart app! Go to the app store, search "MyChart", open the app, select [place], and log in with your MyChart username and password.

## 2023-08-10 NOTE — Progress Notes (Signed)
 Patient says she's been feeling okay. She said she would like a refill on magic mouth wash, which I will take care of. She states she has no new or acute concerns at this time.

## 2023-08-10 NOTE — Progress Notes (Signed)
 Patient needs refill on magic mouthwash; script is not transmitting electronically.  Outbound call to [facility] [phone]; was connected with dispensing pharmacy spoke to Lamar Lied who indicated they haven't this for about one year now.  Rph mentioned [facility] has simple compounding and due it there.  Voice message left for patient to return call.  Script for [employer] faxed to [facility].

## 2023-08-11 ENCOUNTER — Telehealth: Payer: Self-pay | Admitting: *Deleted

## 2023-08-11 ENCOUNTER — Other Ambulatory Visit: Payer: Self-pay

## 2023-08-11 LAB — CANCER ANTIGEN 19-9: CA 19-9: 364 U/mL — ABNORMAL HIGH (ref 0–35)

## 2023-08-11 NOTE — Addendum Note (Signed)
 Encounter addended by: Janice Lynwood BROCKS on: 08/11/2023 2:01 PM  Actions taken: Imaging Exam ended

## 2023-08-11 NOTE — Telephone Encounter (Signed)
 I called the patient and let her know that it was sent to  [facility] to get the Magic mouthwash.  She says that the pharmacy did call her yesterday and she got it

## 2023-08-12 ENCOUNTER — Inpatient Hospital Stay

## 2023-08-12 ENCOUNTER — Encounter: Payer: Self-pay | Admitting: Oncology

## 2023-08-12 VITALS — BP 152/83 | HR 89 | Temp 97.0°F | Resp 18

## 2023-08-12 DIAGNOSIS — Z5111 Encounter for antineoplastic chemotherapy: Secondary | ICD-10-CM | POA: Diagnosis not present

## 2023-08-12 DIAGNOSIS — C252 Malignant neoplasm of tail of pancreas: Secondary | ICD-10-CM

## 2023-08-12 MED ORDER — PEGFILGRASTIM-CBQV 6 MG/0.6ML ~~LOC~~ SOSY
6.0000 mg | PREFILLED_SYRINGE | Freq: Once | SUBCUTANEOUS | Status: AC
  Administered 2023-08-12: 6 mg via SUBCUTANEOUS
  Filled 2023-08-12: qty 0.6

## 2023-08-13 ENCOUNTER — Inpatient Hospital Stay

## 2023-08-14 DIAGNOSIS — I82890 Acute embolism and thrombosis of other specified veins: Secondary | ICD-10-CM | POA: Diagnosis not present

## 2023-08-14 DIAGNOSIS — C252 Malignant neoplasm of tail of pancreas: Secondary | ICD-10-CM | POA: Diagnosis not present

## 2023-08-14 DIAGNOSIS — K769 Liver disease, unspecified: Secondary | ICD-10-CM | POA: Diagnosis not present

## 2023-08-18 ENCOUNTER — Other Ambulatory Visit: Payer: Self-pay | Admitting: Oncology

## 2023-08-18 ENCOUNTER — Inpatient Hospital Stay
Admission: RE | Admit: 2023-08-18 | Discharge: 2023-08-18 | Disposition: A | Discharge status: Home / Self Care | Payer: Self-pay | Source: Ambulatory Visit | Attending: Oncology | Admitting: Oncology

## 2023-08-18 ENCOUNTER — Other Ambulatory Visit: Payer: Self-pay

## 2023-08-18 DIAGNOSIS — C252 Malignant neoplasm of tail of pancreas: Secondary | ICD-10-CM

## 2023-08-19 ENCOUNTER — Ambulatory Visit: Admitting: *Deleted

## 2023-08-19 VITALS — Ht 64.0 in | Wt 173.0 lb

## 2023-08-19 DIAGNOSIS — Z0000 Encounter for general adult medical examination without abnormal findings: Secondary | ICD-10-CM
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
## 2023-08-19 NOTE — Patient Instructions (Signed)
 Kathleen Russell , Thank you for taking time out of your busy schedule to complete your Annual Wellness Visit with me. I enjoyed our conversation and look forward to speaking with you again next year. I, as well as your care team,  appreciate your ongoing commitment to your health goals. Please review the following plan we discussed and let me know if I can assist you in the future. Your Game plan/ To Do List    Referrals: If you haven't heard from the office you've been referred to, please reach out to them at the phone provided.  Consider updating your flu and covid vaccines  Follow up Visits:  We will see or speak with you next year for your Next Medicare AWV with our clinical staff 08/23/24 @ 2:20 Have you seen your provider in the last 6 months (3 months if uncontrolled diabetes)? Yes  Clinician Recommendations:  Aim for 30 minutes of exercise or brisk walking, 6-8 glasses of water , and 5 servings of fruits and vegetables each day.       This is a list of the screenings recommended for you:  Health Maintenance  Topic Date Due   COVID-19 Vaccine (8 - 2024-25 season) 09/07/2022   Flu Shot  09/07/2023*   Mammogram  11/28/2023   Hemoglobin A1C  01/05/2024   Eye exam for diabetics  01/14/2024   Yearly kidney health urinalysis for diabetes  03/09/2024   Complete foot exam   07/07/2024   Yearly kidney function blood test for diabetes  08/09/2024   Medicare Annual Wellness Visit  08/18/2024   Colon Cancer Screening  04/10/2026   DTaP/Tdap/Td vaccine (4 - Td or Tdap) 12/26/2029   Pneumococcal Vaccine for age over 86  Completed   DEXA scan (bone density measurement)  Completed   Hepatitis C Screening  Completed   Zoster (Shingles) Vaccine  Completed   Hepatitis B Vaccine  Aged Out   HPV Vaccine  Aged Out   Meningitis B Vaccine  Aged Out  *Topic was postponed. The date shown is not the original due date.    Advanced directives: (In Chart) A copy of your advanced directives are scanned into  your chart should your provider ever need it. Advance Care Planning is important because it:  [x]  Makes sure you receive the medical care that is consistent with your values, goals, and preferences  [x]  It provides guidance to your family and loved ones and reduces their decisional burden about whether or not they are making the right decisions based on your wishes.

## 2023-08-20 ENCOUNTER — Other Ambulatory Visit: Payer: Self-pay

## 2023-08-20 ENCOUNTER — Other Ambulatory Visit

## 2023-08-24 ENCOUNTER — Inpatient Hospital Stay

## 2023-08-24 ENCOUNTER — Encounter: Payer: Self-pay | Admitting: Oncology

## 2023-08-24 ENCOUNTER — Inpatient Hospital Stay: Admitting: Oncology

## 2023-08-24 VITALS — BP 119/74 | HR 106 | Temp 98.3°F | Resp 17 | Wt 173.5 lb

## 2023-08-24 DIAGNOSIS — C252 Malignant neoplasm of tail of pancreas: Secondary | ICD-10-CM

## 2023-08-24 DIAGNOSIS — Z5111 Encounter for antineoplastic chemotherapy: Secondary | ICD-10-CM | POA: Diagnosis not present

## 2023-08-24 DIAGNOSIS — C259 Malignant neoplasm of pancreas, unspecified: Secondary | ICD-10-CM | POA: Diagnosis not present

## 2023-08-24 LAB — CBC WITH DIFFERENTIAL (CANCER CENTER ONLY)
Abs Immature Granulocytes: 0.23 K/uL — ABNORMAL HIGH (ref 0.00–0.07)
Basophils Absolute: 0.1 K/uL (ref 0.0–0.1)
Basophils Relative: 0 %
Eosinophils Absolute: 0 K/uL (ref 0.0–0.5)
Eosinophils Relative: 0 %
HCT: 37.1 % (ref 36.0–46.0)
Hemoglobin: 12.2 g/dL (ref 12.0–15.0)
Immature Granulocytes: 2 %
Lymphocytes Relative: 21 %
Lymphs Abs: 2.7 K/uL (ref 0.7–4.0)
MCH: 28.8 pg (ref 26.0–34.0)
MCHC: 32.9 g/dL (ref 30.0–36.0)
MCV: 87.5 fL (ref 80.0–100.0)
Monocytes Absolute: 0.6 K/uL (ref 0.1–1.0)
Monocytes Relative: 5 %
Neutro Abs: 9 K/uL — ABNORMAL HIGH (ref 1.7–7.7)
Neutrophils Relative %: 72 %
Platelet Count: 157 K/uL (ref 150–400)
RBC: 4.24 MIL/uL (ref 3.87–5.11)
RDW: 14.7 % (ref 11.5–15.5)
WBC Count: 12.6 K/uL — ABNORMAL HIGH (ref 4.0–10.5)
nRBC: 0 % (ref 0.0–0.2)

## 2023-08-24 LAB — CMP (CANCER CENTER ONLY)
ALT: 13 U/L (ref 0–44)
AST: 21 U/L (ref 15–41)
Albumin: 3.6 g/dL (ref 3.5–5.0)
Alkaline Phosphatase: 138 U/L — ABNORMAL HIGH (ref 38–126)
Anion gap: 11 (ref 5–15)
BUN: 11 mg/dL (ref 8–23)
CO2: 22 mmol/L (ref 22–32)
Calcium: 8.4 mg/dL — ABNORMAL LOW (ref 8.9–10.3)
Chloride: 105 mmol/L (ref 98–111)
Creatinine: 0.68 mg/dL (ref 0.44–1.00)
GFR, Estimated: >60 mL/min (ref >60)
Glucose, Bld: 174 mg/dL — ABNORMAL HIGH (ref 70–99)
Potassium: 3.3 mmol/L — ABNORMAL LOW (ref 3.5–5.1)
Sodium: 138 mmol/L (ref 135–145)
Total Bilirubin: 0.5 mg/dL (ref 0.0–1.2)
Total Protein: 6.3 g/dL — ABNORMAL LOW (ref 6.5–8.1)

## 2023-08-24 MED ORDER — PALONOSETRON HCL INJECTION 0.25 MG/5ML
0.2500 mg | Freq: Once | INTRAVENOUS | Status: AC
  Administered 2023-08-24: 0.25 mg via INTRAVENOUS
  Filled 2023-08-24: qty 5

## 2023-08-24 MED ORDER — DEXTROSE 5 % IV SOLN
INTRAVENOUS | Status: DC
  Filled 2023-08-24: qty 250

## 2023-08-24 MED ORDER — SODIUM CHLORIDE 0.9 % IV SOLN
400.0000 mg/m2 | Freq: Once | INTRAVENOUS | Status: AC
  Administered 2023-08-24: 760 mg via INTRAVENOUS
  Filled 2023-08-24: qty 25

## 2023-08-24 MED ORDER — SODIUM CHLORIDE 0.9 % IV SOLN
150.0000 mg/m2 | Freq: Once | INTRAVENOUS | Status: AC
  Administered 2023-08-24: 300 mg via INTRAVENOUS
  Filled 2023-08-24: qty 15

## 2023-08-24 MED ORDER — OXALIPLATIN CHEMO INJECTION 100 MG/20ML
65.0000 mg/m2 | Freq: Once | INTRAVENOUS | Status: AC
  Administered 2023-08-24: 125 mg via INTRAVENOUS
  Filled 2023-08-24: qty 25

## 2023-08-24 MED ORDER — SODIUM CHLORIDE 0.9 % IV SOLN
150.0000 mg | Freq: Once | INTRAVENOUS | Status: AC
  Administered 2023-08-24: 150 mg via INTRAVENOUS
  Filled 2023-08-24: qty 150

## 2023-08-24 MED ORDER — DEXAMETHASONE SODIUM PHOSPHATE 10 MG/ML IJ SOLN
10.0000 mg | Freq: Once | INTRAMUSCULAR | Status: AC
  Administered 2023-08-24: 10 mg via INTRAVENOUS
  Filled 2023-08-24: qty 1

## 2023-08-24 MED ORDER — ATROPINE SULFATE 1 MG/ML IV SOLN
0.5000 mg | Freq: Once | INTRAVENOUS | Status: AC | PRN
  Administered 2023-08-24: 0.5 mg via INTRAVENOUS
  Filled 2023-08-24: qty 1

## 2023-08-24 MED ORDER — SODIUM CHLORIDE 0.9 % IV SOLN
4500.0000 mg | INTRAVENOUS | Status: DC
  Administered 2023-08-24: 4500 mg via INTRAVENOUS
  Filled 2023-08-24: qty 90
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
## 2023-08-24 NOTE — Progress Notes (Signed)
 [facility] CSW Progress Note  Visual merchandiser met with patient to follow-up on emotional support.    Interventions: Provided brief mental health counseling with regard to adjusting to her illness.  She continues to have a strong faith.  She has several friends from high school that support her in various ways.  She has modified activities that bring her joy that are conducive with her abilities.       Follow Up Plan:  Patient will contact CSW with any support or resource needs    Macario CHRISTELLA Au, LCSW Clinical Social Worker [facility]

## 2023-08-24 NOTE — Progress Notes (Signed)
 Patient has a spot on her left calf that she is concerned about and would like insight regarding it.

## 2023-08-24 NOTE — Patient Instructions (Signed)
 [facility] - A DEPT OF . [facility]  Discharge Instructions: Thank you for choosing [facility] to provide your oncology and hematology care.  If you have a lab appointment with the Cancer Center, please go directly to the Cancer Center and check in at the registration area.  Wear comfortable clothing and clothing appropriate for easy access to any Portacath or PICC line.   We strive to give you quality time with your provider. You may need to reschedule your appointment if you arrive late (15 or more minutes).  Arriving late affects you and other patients whose appointments are after yours.  Also, if you miss three or more appointments without notifying the office, you may be dismissed from the clinic at the provider's discretion.      For prescription refill requests, have your pharmacy contact our office and allow 72 hours for refills to be completed.    Today you received the following chemotherapy and/or immunotherapy agents OXALIPLATIN , LEUCOVORIN , IRINOTECAN , 5FU      To help prevent nausea and vomiting after your treatment, we encourage you to take your nausea medication as directed.  BELOW ARE SYMPTOMS THAT SHOULD BE REPORTED IMMEDIATELY: *FEVER GREATER THAN 100.4 F (38 C) OR HIGHER *CHILLS OR SWEATING *NAUSEA AND VOMITING THAT IS NOT CONTROLLED WITH YOUR NAUSEA MEDICATION *UNUSUAL SHORTNESS OF BREATH *UNUSUAL BRUISING OR BLEEDING *URINARY PROBLEMS (pain or burning when urinating, or frequent urination) *BOWEL PROBLEMS (unusual diarrhea, constipation, pain near the anus) TENDERNESS IN MOUTH AND THROAT WITH OR WITHOUT PRESENCE OF ULCERS (sore throat, sores in mouth, or a toothache) UNUSUAL RASH, SWELLING OR PAIN  UNUSUAL VAGINAL DISCHARGE OR ITCHING   Items with * indicate a potential emergency and should be followed up as soon as possible or go to the Emergency Department if any problems should occur.  Please show the CHEMOTHERAPY  ALERT CARD or IMMUNOTHERAPY ALERT CARD at check-in to the Emergency Department and triage nurse.  Should you have questions after your visit or need to cancel or reschedule your appointment, please contact [facility] - A DEPT OF [facility]  [phone] and follow the prompts.  Office hours are 8:00 a.m. to 4:30 p.m. Monday - Friday. Please note that voicemails left after 4:00 p.m. may not be returned until the following business day.  We are closed weekends and major holidays. You have access to a nurse at all times for urgent questions. Please call the main number to the clinic [phone] and follow the prompts.  For any non-urgent questions, you may also contact your provider using MyChart. We now offer e-Visits for anyone 70 and older to request care online for non-urgent symptoms. For details visit [email or web address].   Also download the MyChart app! Go to the app store, search MyChart, open the app, select [facility], and log in with your MyChart username and password.  Oxaliplatin  Injection What is this medication? OXALIPLATIN  (ox AL i PLA tin) treats colorectal cancer. It works by slowing down the growth of cancer cells. This medicine may be used for other purposes; ask your health care provider or pharmacist if you have questions. COMMON BRAND NAME(S): Eloxatin  What should I tell my care team before I take this medication? They need to know if you have any of these conditions: Heart disease History of irregular heartbeat or rhythm Liver disease Low blood cell levels (white cells, red cells, and platelets) Lung or breathing disease,  such as asthma Take medications that treat or prevent blood clots Tingling of the fingers, toes, or other nerve disorder An unusual or allergic reaction to oxaliplatin , other medications, foods, dyes, or preservatives If you or your partner are pregnant or trying to get pregnant Breast-feeding How should I use  this medication? This medication is injected into a vein. It is given by your care team in a hospital or clinic setting. Talk to your care team about the use of this medication in children. Special care may be needed. Overdosage: If you think you have taken too much of this medicine contact a poison control center or emergency room at once. NOTE: This medicine is only for you. Do not share this medicine with others. What if I miss a dose? Keep appointments for follow-up doses. It is important not to miss a dose. Call your care team if you are unable to keep an appointment. What may interact with this medication? Do not take this medication with any of the following: Cisapride Dronedarone Pimozide Thioridazine This medication may also interact with the following: Aspirin and aspirin-like medications Certain medications that treat or prevent blood clots, such as warfarin, apixaban, dabigatran, and rivaroxaban Cisplatin Cyclosporine Diuretics Medications for infection, such as acyclovir, adefovir, amphotericin B, bacitracin, cidofovir, foscarnet, ganciclovir, gentamicin, pentamidine, vancomycin  NSAIDs, medications for pain and inflammation, such as ibuprofen  or naproxen Other medications that cause heart rhythm changes Pamidronate Zoledronic acid This list may not describe all possible interactions. Give your health care provider a list of all the medicines, herbs, non-prescription drugs, or dietary supplements you use. Also tell them if you smoke, drink alcohol, or use illegal drugs. Some items may interact with your medicine. What should I watch for while using this medication? Your condition will be monitored carefully while you are receiving this medication. You may need blood work while taking this medication. This medication may make you feel generally unwell. This is not uncommon as chemotherapy can affect healthy cells as well as cancer cells. Report any side effects. Continue your  course of treatment even though you feel ill unless your care team tells you to stop. This medication may increase your risk of getting an infection. Call your care team for advice if you get a fever, chills, sore throat, or other symptoms of a cold or flu. Do not treat yourself. Try to avoid being around people who are sick. Avoid taking medications that contain aspirin, acetaminophen , ibuprofen , naproxen, or ketoprofen unless instructed by your care team. These medications may hide a fever. Be careful brushing or flossing your teeth or using a toothpick because you may get an infection or bleed more easily. If you have any dental work done, tell your dentist you are receiving this medication. This medication can make you more sensitive to cold. Do not drink cold drinks or use ice. Cover exposed skin before coming in contact with cold temperatures or cold objects. When out in cold weather wear warm clothing and cover your mouth and nose to warm the air that goes into your lungs. Tell your care team if you get sensitive to the cold. Talk to your care team if you or your partner are pregnant or think either of you might be pregnant. This medication can cause serious birth defects if taken during pregnancy and for 9 months after the last dose. A negative pregnancy test is required before starting this medication. A reliable form of contraception is recommended while taking this medication and for 9 months after  the last dose. Talk to your care team about effective forms of contraception. Do not father a child while taking this medication and for 6 months after the last dose. Use a condom while having sex during this time period. Do not breastfeed while taking this medication and for 3 months after the last dose. This medication may cause infertility. Talk to your care team if you are concerned about your fertility. What side effects may I notice from receiving this medication? Side effects that you should  report to your care team as soon as possible: Allergic reactions--skin rash, itching, hives, swelling of the face, lips, tongue, or throat Bleeding--bloody or black, tar-like stools, vomiting blood or brown material that looks like coffee grounds, red or dark brown urine, small red or purple spots on skin, unusual bruising or bleeding Dry cough, shortness of breath or trouble breathing Heart rhythm changes--fast or irregular heartbeat, dizziness, feeling faint or lightheaded, chest pain, trouble breathing Infection--fever, chills, cough, sore throat, wounds that don't heal, pain or trouble when passing urine, general feeling of discomfort or being unwell Liver injury--right upper belly pain, loss of appetite, nausea, light-colored stool, dark yellow or brown urine, yellowing skin or eyes, unusual weakness or fatigue Low red blood cell level--unusual weakness or fatigue, dizziness, headache, trouble breathing Muscle injury--unusual weakness or fatigue, muscle pain, dark yellow or brown urine, decrease in amount of urine Pain, tingling, or numbness in the hands or feet Sudden and severe headache, confusion, change in vision, seizures, which may be signs of posterior reversible encephalopathy syndrome (PRES) Unusual bruising or bleeding Side effects that usually do not require medical attention (report to your care team if they continue or are bothersome): Diarrhea Nausea Pain, redness, or swelling with sores inside the mouth or throat Unusual weakness or fatigue Vomiting This list may not describe all possible side effects. Call your doctor for medical advice about side effects. You may report side effects to FDA at 1-800-FDA-1088. Where should I keep my medication? This medication is given in a hospital or clinic. It will not be stored at home. NOTE: This sheet is a summary. It may not cover all possible information. If you have questions about this medicine, talk to your doctor, pharmacist, or  health care provider.  2024 Elsevier/Gold Standard (2022-12-05 00:00:00)  Irinotecan  Injection What is this medication? IRINOTECAN  (ir in oh TEE kan) treats some types of cancer. It works by slowing down the growth of cancer cells. This medicine may be used for other purposes; ask your health care provider or pharmacist if you have questions. COMMON BRAND NAME(S): Camptosar  What should I tell my care team before I take this medication? They need to know if you have any of these conditions: Dehydration Diarrhea Infection, especially a viral infection, such as chickenpox, cold sores, herpes Liver disease Low blood cell levels (white cells, red cells, and platelets) Low levels of electrolytes, such as calcium , magnesium , or potassium in your blood Recent or ongoing radiation An unusual or allergic reaction to irinotecan , other medications, foods, dyes, or preservatives If you or your partner are pregnant or trying to get pregnant Breast-feeding How should I use this medication? This medication is injected into a vein. It is given by your care team in a hospital or clinic setting. Talk to your care team about the use of this medication in children. Special care may be needed. Overdosage: If you think you have taken too much of this medicine contact a poison control center or emergency room  at once. NOTE: This medicine is only for you. Do not share this medicine with others. What if I miss a dose? Keep appointments for follow-up doses. It is important not to miss your dose. Call your care team if you are unable to keep an appointment. What may interact with this medication? Do not take this medication with any of the following: Cobicistat Itraconazole This medication may also interact with the following: Certain antibiotics, such as clarithromycin, rifampin, rifabutin Certain antivirals for HIV or AIDS Certain medications for fungal infections, such as ketoconazole, posaconazole,  voriconazole Certain medications for seizures, such as carbamazepine, phenobarbital, phenytoin Gemfibrozil Nefazodone St. John's wort This list may not describe all possible interactions. Give your health care provider a list of all the medicines, herbs, non-prescription drugs, or dietary supplements you use. Also tell them if you smoke, drink alcohol, or use illegal drugs. Some items may interact with your medicine. What should I watch for while using this medication? Your condition will be monitored carefully while you are receiving this medication. You may need blood work while taking this medication. This medication may make you feel generally unwell. This is not uncommon as chemotherapy can affect healthy cells as well as cancer cells. Report any side effects. Continue your course of treatment even though you feel ill unless your care team tells you to stop. This medication can cause serious side effects. To reduce the risk, your care team may give you other medications to take before receiving this one. Be sure to follow the directions from your care team. This medication may affect your coordination, reaction time, or judgement. Do not drive or operate machinery until you know how this medication affects you. Sit up or stand slowly to reduce the risk of dizzy or fainting spells. Drinking alcohol with this medication can increase the risk of these side effects. This medication may increase your risk of getting an infection. Call your care team for advice if you get a fever, chills, sore throat, or other symptoms of a cold or flu. Do not treat yourself. Try to avoid being around people who are sick. Avoid taking medications that contain aspirin, acetaminophen , ibuprofen , naproxen, or ketoprofen unless instructed by your care team. These medications may hide a fever. This medication may increase your risk to bruise or bleed. Call your care team if you notice any unusual bleeding. Be careful  brushing or flossing your teeth or using a toothpick because you may get an infection or bleed more easily. If you have any dental work done, tell your dentist you are receiving this medication. Talk to your care team if you or your partner are pregnant or think either of you might be pregnant. This medication can cause serious birth defects if taken during pregnancy and for 6 months after the last dose. You will need a negative pregnancy test before starting this medication. Contraception is recommended while taking this medication and for 6 months after the last dose. Your care team can help you find the option that works for you. Do not father a child while taking this medication and for 3 months after the last dose. Use a condom for contraception during this time period. Do not breastfeed while taking this medication and for 7 days after the last dose. This medication may cause infertility. Talk to your care team if you are concerned about your fertility. What side effects may I notice from receiving this medication? Side effects that you should report to your care team as  soon as possible: Allergic reactions--skin rash, itching, hives, swelling of the face, lips, tongue, or throat Dry cough, shortness of breath or trouble breathing Increased saliva or tears, increased sweating, stomach cramping, diarrhea, small pupils, unusual weakness or fatigue, slow heartbeat Infection--fever, chills, cough, sore throat, wounds that don't heal, pain or trouble when passing urine, general feeling of discomfort or being unwell Kidney injury--decrease in the amount of urine, swelling of the ankles, hands, or feet Low red blood cell level--unusual weakness or fatigue, dizziness, headache, trouble breathing Severe or prolonged diarrhea Unusual bruising or bleeding Side effects that usually do not require medical attention (report to your care team if they continue or are bothersome): Constipation Diarrhea Hair  loss Loss of appetite Nausea Stomach pain This list may not describe all possible side effects. Call your doctor for medical advice about side effects. You may report side effects to FDA at 1-800-FDA-1088. Where should I keep my medication? This medication is given in a hospital or clinic. It will not be stored at home. NOTE: This sheet is a summary. It may not cover all possible information. If you have questions about this medicine, talk to your doctor, pharmacist, or health care provider.  2024 Elsevier/Gold Standard (2021-05-06 00:00:00)  Leucovorin  Injection What is this medication? LEUCOVORIN  (loo koe VOR in) prevents side effects from certain medications, such as methotrexate. It works by increasing folate levels. This helps protect healthy cells in your body. It may also be used to treat anemia caused by low levels of folate. It can also be used with fluorouracil , a type of chemotherapy, to treat colorectal cancer. It works by increasing the effects of fluorouracil  in the body. This medicine may be used for other purposes; ask your health care provider or pharmacist if you have questions. What should I tell my care team before I take this medication? They need to know if you have any of these conditions: Anemia from low levels of vitamin B12 in the blood An unusual or allergic reaction to leucovorin , folic acid , other medications, foods, dyes, or preservatives Pregnant or trying to get pregnant Breastfeeding How should I use this medication? This medication is injected into a vein or a muscle. It is given by your care team in a hospital or clinic setting. Talk to your care team about the use of this medication in children. Special care may be needed. Overdosage: If you think you have taken too much of this medicine contact a poison control center or emergency room at once. NOTE: This medicine is only for you. Do not share this medicine with others. What if I miss a dose? Keep  appointments for follow-up doses. It is important not to miss your dose. Call your care team if you are unable to keep an appointment. What may interact with this medication? Capecitabine Fluorouracil  Phenobarbital Phenytoin Primidone Trimethoprim;sulfamethoxazole This list may not describe all possible interactions. Give your health care provider a list of all the medicines, herbs, non-prescription drugs, or dietary supplements you use. Also tell them if you smoke, drink alcohol, or use illegal drugs. Some items may interact with your medicine. What should I watch for while using this medication? Your condition will be monitored carefully while you are receiving this medication. This medication may increase the side effects of 5-fluorouracil . Tell your care team if you have diarrhea or mouth sores that do not get better or that get worse. What side effects may I notice from receiving this medication? Side effects that you should report  to your care team as soon as possible: Allergic reactions--skin rash, itching, hives, swelling of the face, lips, tongue, or throat This list may not describe all possible side effects. Call your doctor for medical advice about side effects. You may report side effects to FDA at 1-800-FDA-1088. Where should I keep my medication? This medication is given in a hospital or clinic. It will not be stored at home. NOTE: This sheet is a summary. It may not cover all possible information. If you have questions about this medicine, talk to your doctor, pharmacist, or health care provider.  2024 Elsevier/Gold Standard (2021-05-28 00:00:00)   Fluorouracil  Injection What is this medication? FLUOROURACIL  (flure oh YOOR a sil) treats some types of cancer. It works by slowing down the growth of cancer cells. This medicine may be used for other purposes; ask your health care provider or pharmacist if you have questions. COMMON BRAND NAME(S): Adrucil  What should I tell my  care team before I take this medication? They need to know if you have any of these conditions: Blood disorders Dihydropyrimidine dehydrogenase (DPD) deficiency Infection, such as chickenpox, cold sores, herpes Kidney disease Liver disease Poor nutrition Recent or ongoing radiation therapy An unusual or allergic reaction to fluorouracil , other medications, foods, dyes, or preservatives If you or your partner are pregnant or trying to get pregnant Breast-feeding How should I use this medication? This medication is injected into a vein. It is administered by your care team in a hospital or clinic setting. Talk to your care team about the use of this medication in children. Special care may be needed. Overdosage: If you think you have taken too much of this medicine contact a poison control center or emergency room at once. NOTE: This medicine is only for you. Do not share this medicine with others. What if I miss a dose? Keep appointments for follow-up doses. It is important not to miss your dose. Call your care team if you are unable to keep an appointment. What may interact with this medication? Do not take this medication with any of the following: Live virus vaccines This medication may also interact with the following: Medications that treat or prevent blood clots, such as warfarin, enoxaparin , dalteparin This list may not describe all possible interactions. Give your health care provider a list of all the medicines, herbs, non-prescription drugs, or dietary supplements you use. Also tell them if you smoke, drink alcohol, or use illegal drugs. Some items may interact with your medicine. What should I watch for while using this medication? Your condition will be monitored carefully while you are receiving this medication. This medication may make you feel generally unwell. This is not uncommon as chemotherapy can affect healthy cells as well as cancer cells. Report any side effects.  Continue your course of treatment even though you feel ill unless your care team tells you to stop. In some cases, you may be given additional medications to help with side effects. Follow all directions for their use. This medication may increase your risk of getting an infection. Call your care team for advice if you get a fever, chills, sore throat, or other symptoms of a cold or flu. Do not treat yourself. Try to avoid being around people who are sick. This medication may increase your risk to bruise or bleed. Call your care team if you notice any unusual bleeding. Be careful brushing or flossing your teeth or using a toothpick because you may get an infection or bleed more easily.  If you have any dental work done, tell your dentist you are receiving this medication. Avoid taking medications that contain aspirin, acetaminophen , ibuprofen , naproxen, or ketoprofen unless instructed by your care team. These medications may hide a fever. Do not treat diarrhea with over the counter products. Contact your care team if you have diarrhea that lasts more than 2 days or if it is severe and watery. This medication can make you more sensitive to the sun. Keep out of the sun. If you cannot avoid being in the sun, wear protective clothing and sunscreen. Do not use sun lamps, tanning beds, or tanning booths. Talk to your care team if you or your partner wish to become pregnant or think you might be pregnant. This medication can cause serious birth defects if taken during pregnancy and for 3 months after the last dose. A reliable form of contraception is recommended while taking this medication and for 3 months after the last dose. Talk to your care team about effective forms of contraception. Do not father a child while taking this medication and for 3 months after the last dose. Use a condom while having sex during this time period. Do not breastfeed while taking this medication. This medication may cause  infertility. Talk to your care team if you are concerned about your fertility. What side effects may I notice from receiving this medication? Side effects that you should report to your care team as soon as possible: Allergic reactions--skin rash, itching, hives, swelling of the face, lips, tongue, or throat Heart attack--pain or tightness in the chest, shoulders, arms, or jaw, nausea, shortness of breath, cold or clammy skin, feeling faint or lightheaded Heart failure--shortness of breath, swelling of the ankles, feet, or hands, sudden weight gain, unusual weakness or fatigue Heart rhythm changes--fast or irregular heartbeat, dizziness, feeling faint or lightheaded, chest pain, trouble breathing High ammonia level--unusual weakness or fatigue, confusion, loss of appetite, nausea, vomiting, seizures Infection--fever, chills, cough, sore throat, wounds that don't heal, pain or trouble when passing urine, general feeling of discomfort or being unwell Low red blood cell level--unusual weakness or fatigue, dizziness, headache, trouble breathing Pain, tingling, or numbness in the hands or feet, muscle weakness, change in vision, confusion or trouble speaking, loss of balance or coordination, trouble walking, seizures Redness, swelling, and blistering of the skin over hands and feet Severe or prolonged diarrhea Unusual bruising or bleeding Side effects that usually do not require medical attention (report to your care team if they continue or are bothersome): Dry skin Headache Increased tears Nausea Pain, redness, or swelling with sores inside the mouth or throat Sensitivity to light Vomiting This list may not describe all possible side effects. Call your doctor for medical advice about side effects. You may report side effects to FDA at 1-800-FDA-1088. Where should I keep my medication? This medication is given in a hospital or clinic. It will not be stored at home. NOTE: This sheet is a summary.  It may not cover all possible information. If you have questions about this medicine, talk to your doctor, pharmacist, or health care provider.  2024 Elsevier/Gold Standard (2021-04-30 00:00:00)

## 2023-08-25 ENCOUNTER — Encounter: Payer: Self-pay | Admitting: Internal Medicine

## 2023-08-25 LAB — CANCER ANTIGEN 19-9: CA 19-9: 397 U/mL — ABNORMAL HIGH (ref 0–35)

## 2023-08-25 NOTE — Telephone Encounter (Signed)
 If okay I will place a sensor up front for pt to pickup today. Pt would also like to know if she should be increase her units of lantus  based off her high readings and being on prednisone . I have printed her herlene report for you to review.

## 2023-08-26 ENCOUNTER — Inpatient Hospital Stay

## 2023-08-26 ENCOUNTER — Other Ambulatory Visit: Payer: Self-pay

## 2023-08-26 DIAGNOSIS — C252 Malignant neoplasm of tail of pancreas: Secondary | ICD-10-CM | POA: Diagnosis not present

## 2023-08-26 DIAGNOSIS — C259 Malignant neoplasm of pancreas, unspecified: Secondary | ICD-10-CM | POA: Diagnosis not present

## 2023-08-26 MED ORDER — STERILE WATER FOR INJECTION IJ SOLN
5.0000 mL | Freq: Four times a day (QID) | OROMUCOSAL | 3 refills | Status: DC
  Filled 2023-08-26: qty 480, 12d supply, fill #0
  Filled 2023-09-23: qty 480, 12d supply, fill #1
  Filled 2023-10-07: qty 480, 12d supply, fill #2
  Filled 2023-10-20: qty 480, 12d supply, fill #3

## 2023-08-27 ENCOUNTER — Inpatient Hospital Stay

## 2023-08-27 NOTE — Progress Notes (Signed)
 Nutrition Follow-up:   Patient with pancreatic cancer.  Receiving modified folfirinox.   Spoke with patient via phone for nutrition follow-up.  Reports that her appetite has been decreased some this week as received treatment on Monday although starting to pick back up.  This am ate greek yogurt with granola,mini bagel with cream cheese and drank Fairlife shake.  Likes the [place] and [employer].  Has been having a little bit of issues with constipation.  Eating prunes and taking miralax .  Has senna to add if needed    Medications: reviewed  Labs: reviewed  Anthropometrics:   Weight 173 lb 8 oz on 8/18 175 lb on 7/21 171 lb on 5/30 181 lb on 3/4   NUTRITION DIAGNOSIS: Inadequate oral intake stable    INTERVENTION:  Continue oral nutrition supplementation for added calories and protein Continue adequate calories and protein to help maintain weight during treatment.      MONITORING, EVALUATION, GOAL: weight trends, intake   NEXT VISIT: Tuesday, Sept 2nd during infusion  Debby Clyne B. Dasie SOLON, CSO, LDN Registered Dietitian [phone]

## 2023-08-28 DIAGNOSIS — Z5181 Encounter for therapeutic drug level monitoring: Secondary | ICD-10-CM | POA: Diagnosis not present

## 2023-08-28 DIAGNOSIS — Z79899 Other long term (current) drug therapy: Secondary | ICD-10-CM | POA: Diagnosis not present

## 2023-08-28 DIAGNOSIS — Z01812 Encounter for preprocedural laboratory examination: Secondary | ICD-10-CM | POA: Diagnosis not present

## 2023-08-28 DIAGNOSIS — C259 Malignant neoplasm of pancreas, unspecified: Secondary | ICD-10-CM | POA: Diagnosis not present

## 2023-08-28 DIAGNOSIS — C787 Secondary malignant neoplasm of liver and intrahepatic bile duct: Secondary | ICD-10-CM | POA: Diagnosis not present

## 2023-08-28 DIAGNOSIS — K769 Liver disease, unspecified: Secondary | ICD-10-CM | POA: Diagnosis not present

## 2023-09-02 ENCOUNTER — Telehealth: Payer: Self-pay | Admitting: *Deleted

## 2023-09-02 NOTE — Telephone Encounter (Signed)
 Patient states that she got chemo and then on Wednesday she got the pump off and she has been having diarrhea.  She says that she has been taking Imodium  2 times a day.  She did not have any diarrhea yesterday but then its come back again and 9/2 she is going to be getting a another infusion of chemo.  She would like to have some information that hopefully the diarrhea is going to stop but before she has to come back on 9/2.

## 2023-09-02 NOTE — Telephone Encounter (Signed)
 Can you send her lomotil ?

## 2023-09-03 ENCOUNTER — Encounter: Payer: Self-pay | Admitting: Oncology

## 2023-09-03 MED ORDER — DIPHENOXYLATE-ATROPINE 2.5-0.025 MG PO TABS: 1.0000 | ORAL_TABLET | Freq: Four times a day (QID) | ORAL | 0 refills | Status: DC | PRN

## 2023-09-03 MED ORDER — PANTOPRAZOLE SODIUM 20 MG PO TBEC: 20.0000 mg | DELAYED_RELEASE_TABLET | Freq: Every day | ORAL | 1 refills | Status: DC

## 2023-09-03 NOTE — Telephone Encounter (Signed)
 Spoke to patient, informed of below.  Stated she is taking Tums for reflux and she feels like her gastrointestinal track is irritated.  Informed if Dr. Melanee advises to take anything else I will be in touch shortly.  Encouraged patient to drink plenty of water  and has been drinking the generic version of Pedialyte to replenish electrolytes.

## 2023-09-03 NOTE — Telephone Encounter (Signed)
 If she has reflux she can use protonix 

## 2023-09-03 NOTE — Telephone Encounter (Signed)
 Lomotil  sent electronically but printed instead.  Outbound call to [facility] spoke to representative who indicated they do have the drug but is checking if someone on their end accidentally printed it.  Resubmitted electronically and it keeps going to printed.  Transferred to Rph Kaitlyn who took verbal order.

## 2023-09-03 NOTE — Addendum Note (Signed)
 Addended by: Jd Mccaster on: 09/03/2023 09:44 AM   Modules accepted: Orders

## 2023-09-03 NOTE — Telephone Encounter (Signed)
 Outbound call to patient to inform I've sent a script of protonix  as well.  Patient states she takes Nexium  at night already; informed patient Dr. Melanee is not in clinic today and I will follow up with a my chart message to clarify how to proceed.

## 2023-09-04 ENCOUNTER — Encounter: Payer: Self-pay | Admitting: Oncology

## 2023-09-07 ENCOUNTER — Other Ambulatory Visit: Payer: Self-pay

## 2023-09-08 ENCOUNTER — Encounter: Payer: Self-pay | Admitting: Oncology

## 2023-09-08 ENCOUNTER — Inpatient Hospital Stay: Attending: Oncology

## 2023-09-08 ENCOUNTER — Inpatient Hospital Stay

## 2023-09-08 ENCOUNTER — Other Ambulatory Visit: Payer: Self-pay

## 2023-09-08 ENCOUNTER — Inpatient Hospital Stay: Admitting: Oncology

## 2023-09-08 ENCOUNTER — Telehealth: Payer: Self-pay

## 2023-09-08 VITALS — BP 125/75 | HR 100 | Temp 95.0°F | Resp 19 | Ht 64.0 in | Wt 172.8 lb

## 2023-09-08 DIAGNOSIS — E876 Hypokalemia: Secondary | ICD-10-CM

## 2023-09-08 DIAGNOSIS — K219 Gastro-esophageal reflux disease without esophagitis: Secondary | ICD-10-CM | POA: Insufficient documentation

## 2023-09-08 DIAGNOSIS — Z79899 Other long term (current) drug therapy: Secondary | ICD-10-CM | POA: Diagnosis not present

## 2023-09-08 DIAGNOSIS — C252 Malignant neoplasm of tail of pancreas: Secondary | ICD-10-CM

## 2023-09-08 DIAGNOSIS — C787 Secondary malignant neoplasm of liver and intrahepatic bile duct: Secondary | ICD-10-CM | POA: Insufficient documentation

## 2023-09-08 DIAGNOSIS — C259 Malignant neoplasm of pancreas, unspecified: Secondary | ICD-10-CM | POA: Diagnosis not present

## 2023-09-08 DIAGNOSIS — T451X5A Adverse effect of antineoplastic and immunosuppressive drugs, initial encounter: Secondary | ICD-10-CM

## 2023-09-08 DIAGNOSIS — D701 Agranulocytosis secondary to cancer chemotherapy: Secondary | ICD-10-CM

## 2023-09-08 DIAGNOSIS — Z5111 Encounter for antineoplastic chemotherapy: Secondary | ICD-10-CM | POA: Diagnosis not present

## 2023-09-08 DIAGNOSIS — L309 Dermatitis, unspecified: Secondary | ICD-10-CM | POA: Diagnosis not present

## 2023-09-08 DIAGNOSIS — Z452 Encounter for adjustment and management of vascular access device: Secondary | ICD-10-CM | POA: Diagnosis not present

## 2023-09-08 LAB — CMP (CANCER CENTER ONLY)
ALT: 14 U/L (ref 0–44)
AST: 17 U/L (ref 15–41)
Albumin: 3.4 g/dL — ABNORMAL LOW (ref 3.5–5.0)
Alkaline Phosphatase: 67 U/L (ref 38–126)
Anion gap: 8 (ref 5–15)
BUN: 10 mg/dL (ref 8–23)
CO2: 22 mmol/L (ref 22–32)
Calcium: 8.3 mg/dL — ABNORMAL LOW (ref 8.9–10.3)
Chloride: 104 mmol/L (ref 98–111)
Creatinine: 0.51 mg/dL (ref 0.44–1.00)
GFR, Estimated: >60 mL/min (ref >60)
Glucose, Bld: 158 mg/dL — ABNORMAL HIGH (ref 70–99)
Potassium: 3.1 mmol/L — ABNORMAL LOW (ref 3.5–5.1)
Sodium: 134 mmol/L — ABNORMAL LOW (ref 135–145)
Total Bilirubin: 0.4 mg/dL (ref 0.0–1.2)
Total Protein: 6.1 g/dL — ABNORMAL LOW (ref 6.5–8.1)

## 2023-09-08 LAB — CBC WITH DIFFERENTIAL (CANCER CENTER ONLY)
Abs Immature Granulocytes: 0.01 K/uL (ref 0.00–0.07)
Basophils Absolute: 0 K/uL (ref 0.0–0.1)
Basophils Relative: 1 %
Eosinophils Absolute: 0.1 K/uL (ref 0.0–0.5)
Eosinophils Relative: 2 %
HCT: 36.6 % (ref 36.0–46.0)
Hemoglobin: 12 g/dL (ref 12.0–15.0)
Immature Granulocytes: 0 %
Lymphocytes Relative: 44 %
Lymphs Abs: 1.5 K/uL (ref 0.7–4.0)
MCH: 28 pg (ref 26.0–34.0)
MCHC: 32.8 g/dL (ref 30.0–36.0)
MCV: 85.3 fL (ref 80.0–100.0)
Monocytes Absolute: 0.5 K/uL (ref 0.1–1.0)
Monocytes Relative: 13 %
Neutro Abs: 1.4 K/uL — ABNORMAL LOW (ref 1.7–7.7)
Neutrophils Relative %: 40 %
Platelet Count: 148 K/uL — ABNORMAL LOW (ref 150–400)
RBC: 4.29 MIL/uL (ref 3.87–5.11)
RDW: 14.6 % (ref 11.5–15.5)
WBC Count: 3.5 K/uL — ABNORMAL LOW (ref 4.0–10.5)
nRBC: 0 % (ref 0.0–0.2)

## 2023-09-08 MED ORDER — OXALIPLATIN CHEMO INJECTION 100 MG/20ML
65.0000 mg/m2 | Freq: Once | INTRAVENOUS | Status: AC
  Administered 2023-09-08: 125 mg via INTRAVENOUS
  Filled 2023-09-08: qty 20

## 2023-09-08 MED ORDER — SODIUM CHLORIDE 0.9 % IV SOLN
INTRAVENOUS | Status: DC
  Filled 2023-09-08: qty 250

## 2023-09-08 MED ORDER — SODIUM CHLORIDE 0.9 % IV SOLN
150.0000 mg/m2 | Freq: Once | INTRAVENOUS | Status: AC
  Administered 2023-09-08: 300 mg via INTRAVENOUS
  Filled 2023-09-08: qty 15

## 2023-09-08 MED ORDER — SODIUM CHLORIDE 0.9 % IV SOLN
400.0000 mg/m2 | Freq: Once | INTRAVENOUS | Status: AC
  Administered 2023-09-08: 760 mg via INTRAVENOUS
  Filled 2023-09-08: qty 25

## 2023-09-08 MED ORDER — SODIUM CHLORIDE 0.9 % IV SOLN
4500.0000 mg | INTRAVENOUS | Status: DC
  Administered 2023-09-08: 4500 mg via INTRAVENOUS
  Filled 2023-09-08: qty 90

## 2023-09-08 MED ORDER — DEXAMETHASONE SODIUM PHOSPHATE 10 MG/ML IJ SOLN
10.0000 mg | Freq: Once | INTRAMUSCULAR | Status: AC
  Administered 2023-09-08: 10 mg via INTRAVENOUS
  Filled 2023-09-08: qty 1

## 2023-09-08 MED ORDER — ATROPINE SULFATE 1 MG/ML IV SOLN
0.5000 mg | Freq: Once | INTRAVENOUS | Status: AC | PRN
  Administered 2023-09-08: 0.5 mg via INTRAVENOUS
  Filled 2023-09-08: qty 1

## 2023-09-08 MED ORDER — DEXTROSE 5 % IV SOLN
INTRAVENOUS | Status: DC
Start: 2023-09-08 — End: 2023-09-08
  Filled 2023-09-08: qty 250

## 2023-09-08 MED ORDER — PALONOSETRON HCL INJECTION 0.25 MG/5ML
0.2500 mg | Freq: Once | INTRAVENOUS | Status: AC
  Administered 2023-09-08: 0.25 mg via INTRAVENOUS
  Filled 2023-09-08: qty 5

## 2023-09-08 MED ORDER — ALTEPLASE 2 MG IJ SOLR
2.0000 mg | Freq: Once | INTRAMUSCULAR | Status: AC | PRN
  Administered 2023-09-08: 2 mg
  Filled 2023-09-08: qty 2

## 2023-09-08 MED ORDER — APREPITANT 130 MG/18ML IV EMUL
130.0000 mg | Freq: Once | INTRAVENOUS | Status: AC
  Administered 2023-09-08: 130 mg via INTRAVENOUS
  Filled 2023-09-08: qty 18
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
## 2023-09-08 NOTE — Telephone Encounter (Signed)
 Per Dr. Melanee Can you please ask her if she is taking any oral potassium. Her potassium today was 3.1. We may need to send her oral potassium 20 mill equivalents daily for the next 2 weeks.  Outbound call to patient; voice message left. Will follow voicemail with a my chart message as well.

## 2023-09-08 NOTE — Progress Notes (Signed)
 Patient states she has no new or acute concerns at this time.

## 2023-09-08 NOTE — Progress Notes (Signed)
 Nutrition Follow-up:  Patient with pancreatic cancer.  Receiving modified folfox.  Met with patient during infusion. Reports that appetite has been decreased.  Has been having issues with diarrhea.  Has been able to eat greek yogurt, omelette.  Fairlife and [employer] protein drinks.  Drinking gatorade and water .  Eating soup today during infusion.  Says that she likes the savory foods more than sweet things.      Medications: lomotil   Labs: reviewed  Anthropometrics:   Weight 172 lb 12.8 oz today  173 lb 8 oz on 8/18 175 lb on 7/21 171 lb on 5/30 181 lb on 3/4   NUTRITION DIAGNOSIS: Inadequate oral intake continues   INTERVENTION:  Patient starting on lomotil  Discussed foods to choose with diarrhea Handout provided Encouraged hydration with diarrhea    MONITORING, EVALUATION, GOAL: weight trends, intake   NEXT VISIT: Tuesday, Sept 16 during infusion  Joletta Manner B. Dasie SOLON, CSO, LDN Registered Dietitian [phone]

## 2023-09-09 ENCOUNTER — Encounter: Payer: Self-pay | Admitting: Oncology

## 2023-09-09 DIAGNOSIS — C259 Malignant neoplasm of pancreas, unspecified: Secondary | ICD-10-CM | POA: Diagnosis not present

## 2023-09-09 DIAGNOSIS — C787 Secondary malignant neoplasm of liver and intrahepatic bile duct: Secondary | ICD-10-CM | POA: Diagnosis not present

## 2023-09-09 MED ORDER — POTASSIUM CHLORIDE CRYS ER 20 MEQ PO TBCR: 20.0000 meq | EXTENDED_RELEASE_TABLET | Freq: Once | ORAL | 0 refills | Status: DC

## 2023-09-09 NOTE — Telephone Encounter (Signed)
 My chart message regarding calcium  sent to patient.

## 2023-09-09 NOTE — Telephone Encounter (Signed)
 Patient reviewed my chart message sent on 09/08/2023  6:10 PM; no response received.  Outbound call; patient indicated she is not taking Potassium at this time. Patient requested script to be sent to the [facility] in [place].  Patient also inquired about Calcium  8.3 yesterday; asked if she also needs to be taking a calcium  supplement.  She mentioned she's been informed in the past about to increase calcium  through dietary means.  Informed her I would touch base with Dr. Melanee and loop back via my chart message; patient verbalized understanding.

## 2023-09-09 NOTE — Progress Notes (Signed)
 Telephone Note  Reason for Call:   Pathology Results Liver Biopsy 8/22  Malignant neoplasm of pancreas, unspecified location of malignancy (CMS/HHS-HCC)  (primary encounter diagnosis)  Pancreatic cancer metastasized to liver (CMS/HHS-HCC)    Call placed to Ms. Cerritos and her husband. She did see her results were positive for pancreas met to her liver and future surgery for her pancreas is not advisable. She did see Dr. Melanee her local med onc yesterday and Dr. Romero did also speak with Dr. Melanee regarding biopsy results. Plan moving forward is for a [facility] to be sent on recent liver biopsy (done) and we will send results to Dr. Melanee when available. She also was advised to potentially speak with a [facility] on next systemic options and/or clinical trial information. She states she will discuss her decision regarding referral with Dr. Melanee. She is aware she can also let us  know if she needs anything.   Bruno Panther, MSN, NP-C Nurse Practitioner, [facility] Surgery On[facility]  This telephone encounter was conducted with the patient's (or proxy's) verbal consent via secure, interactive audio telecommunications while in clinic/office/hospital.  The patient (or proxy) was instructed to have this encounter in a suitably private space and to only have persons present to whom they give permission to participate. In addition, the patient identity was confirmed by use of name plus an additional identifier.  This visit was coded based on medical decision making (MDM).

## 2023-09-10 ENCOUNTER — Inpatient Hospital Stay

## 2023-09-10 ENCOUNTER — Other Ambulatory Visit: Payer: Self-pay | Admitting: Oncology

## 2023-09-10 VITALS — BP 136/80 | HR 87 | Temp 97.3°F | Resp 18

## 2023-09-10 DIAGNOSIS — C252 Malignant neoplasm of tail of pancreas: Secondary | ICD-10-CM

## 2023-09-10 DIAGNOSIS — Z5111 Encounter for antineoplastic chemotherapy: Secondary | ICD-10-CM | POA: Diagnosis not present

## 2023-09-10 MED ORDER — SODIUM CHLORIDE 0.9% FLUSH
10.0000 mL | INTRAVENOUS | Status: DC | PRN
  Filled 2023-09-10: qty 10

## 2023-09-10 MED ORDER — PEGFILGRASTIM-CBQV 6 MG/0.6ML ~~LOC~~ SOSY
6.0000 mg | PREFILLED_SYRINGE | Freq: Once | SUBCUTANEOUS | Status: AC
  Administered 2023-09-10: 6 mg via SUBCUTANEOUS
  Filled 2023-09-10: qty 0.6

## 2023-09-11 ENCOUNTER — Inpatient Hospital Stay

## 2023-09-11 ENCOUNTER — Encounter: Payer: Self-pay | Admitting: Internal Medicine

## 2023-09-14 ENCOUNTER — Ambulatory Visit

## 2023-09-14 ENCOUNTER — Ambulatory Visit: Admitting: Oncology

## 2023-09-14 ENCOUNTER — Telehealth: Payer: Self-pay | Admitting: *Deleted

## 2023-09-14 ENCOUNTER — Other Ambulatory Visit

## 2023-09-14 NOTE — Telephone Encounter (Signed)
 [facility] in [place] called and needs clarification or new script sent for potassium. It has not been filled because they didn't know frequency, once a day, bid?     potassium chloride  SA (KLOR-CON  M) 20 MEQ tablet [[id number]]  ENDED   Order Details Dose: 20 mEq Route: Oral Frequency:  Once  Dispense Quantity: 14 tablet (14 day supply) Refills: 0   Duration: 1 days Dispense As Written: No        Sig: Take 1 tablet (20 mEq total) by mouth once for 1 dose.       Start Date: 09/09/23 End Date: 09/09/23 after 1 doses  Written Date: 09/09/23 Expiration Date: 09/08/24     Associated Diagnoses: Hypokalemia [E87.6]  Providers  Ordering and Authorizing Provider: Melanee Annah BROCKS, MD [address] [place], [place] [address] Phone: [phone]   Fax: [phone] DEA #: [id number]   NPI: [id number] --      Ordering User: Faustino Bence, RN      Pharmacy  [facility] [phone] [place], [place] - [address] AT [facility] OF [address] [facility] [address] [place], [place] [address] Phone: [phone]  Fax: [phone] DEA #: [id number]  DAW Reason: --

## 2023-09-14 NOTE — Telephone Encounter (Signed)
 Once daily

## 2023-09-15 ENCOUNTER — Telehealth: Payer: Self-pay

## 2023-09-15 DIAGNOSIS — C252 Malignant neoplasm of tail of pancreas: Secondary | ICD-10-CM | POA: Diagnosis not present

## 2023-09-15 MED ORDER — POTASSIUM CHLORIDE CRYS ER 20 MEQ PO TBCR
20.0000 meq | EXTENDED_RELEASE_TABLET | Freq: Every day | ORAL | 0 refills | Status: DC
Start: 2023-09-15 — End: 2023-10-13

## 2023-09-15 NOTE — Telephone Encounter (Signed)
 Spoke with pt to let her know that we have placed the sample up front for her to come by and pick up.

## 2023-09-15 NOTE — Telephone Encounter (Signed)
 Pt came to pick sample up -kh

## 2023-09-15 NOTE — Telephone Encounter (Signed)
 Copied from CRM #[id number]. Topic: Clinical - Medication Question >> Sep 15, 2023  2:18 PM Thersia BROCKS wrote: Reason for CRM: Patient called in regarding  Continuous Glucose Sensor (FREESTYLE LIBRE 3 PLUS SENSOR) MISC , wanted to know if Harlene could give her a sample and she could come pick it up

## 2023-09-15 NOTE — Addendum Note (Signed)
 Addended by: FAUSTINO BENCE on: 09/15/2023 01:37 PM   Modules accepted: Orders

## 2023-09-15 NOTE — Telephone Encounter (Signed)
 Per patient message sent via my chart Apparently there is some question at [facility] about instructions for taking the Potassium ordered for me. I have been calling them but only get "pharmacist is reviewing the prescription." I finally drove up there today to find that I need someone from the [facility] to clarify for the pharmacist how I am to take this. Will you do this so I can pick up the script and start taking the potassium? Thanks in advance. Kathleen Russell.  Outbound call to [employer], Pensions consultant confirmed new script received and will be ready for pickup after 2:58PM today.

## 2023-09-16 ENCOUNTER — Encounter

## 2023-09-21 ENCOUNTER — Inpatient Hospital Stay

## 2023-09-21 ENCOUNTER — Encounter: Payer: Self-pay | Admitting: Oncology

## 2023-09-21 ENCOUNTER — Inpatient Hospital Stay: Admitting: Oncology

## 2023-09-21 VITALS — BP 129/71 | HR 103 | Temp 96.8°F | Resp 20 | Ht 64.0 in | Wt 171.2 lb

## 2023-09-21 VITALS — HR 94

## 2023-09-21 DIAGNOSIS — C252 Malignant neoplasm of tail of pancreas: Secondary | ICD-10-CM

## 2023-09-21 DIAGNOSIS — L309 Dermatitis, unspecified: Secondary | ICD-10-CM | POA: Diagnosis not present

## 2023-09-21 DIAGNOSIS — Z5111 Encounter for antineoplastic chemotherapy: Secondary | ICD-10-CM | POA: Diagnosis not present

## 2023-09-21 DIAGNOSIS — C259 Malignant neoplasm of pancreas, unspecified: Secondary | ICD-10-CM | POA: Diagnosis not present

## 2023-09-21 LAB — CMP (CANCER CENTER ONLY)
ALT: 16 U/L (ref 0–44)
AST: 19 U/L (ref 15–41)
Albumin: 3.6 g/dL (ref 3.5–5.0)
Alkaline Phosphatase: 101 U/L (ref 38–126)
Anion gap: 7 (ref 5–15)
BUN: 13 mg/dL (ref 8–23)
CO2: 23 mmol/L (ref 22–32)
Calcium: 8.8 mg/dL — ABNORMAL LOW (ref 8.9–10.3)
Chloride: 107 mmol/L (ref 98–111)
Creatinine: 0.71 mg/dL (ref 0.44–1.00)
GFR, Estimated: >60 mL/min (ref >60)
Glucose, Bld: 213 mg/dL — ABNORMAL HIGH (ref 70–99)
Potassium: 3.8 mmol/L (ref 3.5–5.1)
Sodium: 137 mmol/L (ref 135–145)
Total Bilirubin: 0.4 mg/dL (ref 0.0–1.2)
Total Protein: 6.2 g/dL — ABNORMAL LOW (ref 6.5–8.1)

## 2023-09-21 LAB — CBC WITH DIFFERENTIAL (CANCER CENTER ONLY)
Abs Immature Granulocytes: 0.17 K/uL — ABNORMAL HIGH (ref 0.00–0.07)
Basophils Absolute: 0 K/uL (ref 0.0–0.1)
Basophils Relative: 0 %
Eosinophils Absolute: 0 K/uL (ref 0.0–0.5)
Eosinophils Relative: 0 %
HCT: 38 % (ref 36.0–46.0)
Hemoglobin: 12.3 g/dL (ref 12.0–15.0)
Immature Granulocytes: 2 %
Lymphocytes Relative: 20 %
Lymphs Abs: 1.6 K/uL (ref 0.7–4.0)
MCH: 28.3 pg (ref 26.0–34.0)
MCHC: 32.4 g/dL (ref 30.0–36.0)
MCV: 87.4 fL (ref 80.0–100.0)
Monocytes Absolute: 0.4 K/uL (ref 0.1–1.0)
Monocytes Relative: 5 %
Neutro Abs: 6 K/uL (ref 1.7–7.7)
Neutrophils Relative %: 73 %
Platelet Count: 114 K/uL — ABNORMAL LOW (ref 150–400)
RBC: 4.35 MIL/uL (ref 3.87–5.11)
RDW: 15.3 % (ref 11.5–15.5)
WBC Count: 8.2 K/uL (ref 4.0–10.5)
nRBC: 0.2 % (ref 0.0–0.2)

## 2023-09-21 MED ORDER — APREPITANT 130 MG/18ML IV EMUL
130.0000 mg | Freq: Once | INTRAVENOUS | Status: AC
  Administered 2023-09-21: 130 mg via INTRAVENOUS
  Filled 2023-09-21: qty 18

## 2023-09-21 MED ORDER — DEXTROSE 5 % IV SOLN
INTRAVENOUS | Status: DC
  Filled 2023-09-21: qty 250

## 2023-09-21 MED ORDER — ATROPINE SULFATE 1 MG/ML IV SOLN
0.5000 mg | Freq: Once | INTRAVENOUS | Status: AC | PRN
  Administered 2023-09-21: 0.5 mg via INTRAVENOUS
  Filled 2023-09-21: qty 1

## 2023-09-21 MED ORDER — SODIUM CHLORIDE 0.9 % IV SOLN
400.0000 mg/m2 | Freq: Once | INTRAVENOUS | Status: AC
  Administered 2023-09-21: 760 mg via INTRAVENOUS
  Filled 2023-09-21: qty 20.5

## 2023-09-21 MED ORDER — TRIAMCINOLONE ACETONIDE 0.5 % EX OINT: TOPICAL_OINTMENT | CUTANEOUS | 0 refills | Status: DC

## 2023-09-21 MED ORDER — SODIUM CHLORIDE 0.9 % IV SOLN
150.0000 mg/m2 | Freq: Once | INTRAVENOUS | Status: AC
  Administered 2023-09-21: 300 mg via INTRAVENOUS
  Filled 2023-09-21: qty 15

## 2023-09-21 MED ORDER — PALONOSETRON HCL INJECTION 0.25 MG/5ML
0.2500 mg | Freq: Once | INTRAVENOUS | Status: AC
  Administered 2023-09-21: 0.25 mg via INTRAVENOUS
  Filled 2023-09-21: qty 5

## 2023-09-21 MED ORDER — DEXAMETHASONE SODIUM PHOSPHATE 10 MG/ML IJ SOLN
10.0000 mg | Freq: Once | INTRAMUSCULAR | Status: AC
  Administered 2023-09-21: 10 mg via INTRAVENOUS
  Filled 2023-09-21: qty 1

## 2023-09-21 MED ORDER — SODIUM CHLORIDE 0.9 % IV SOLN
4500.0000 mg | INTRAVENOUS | Status: DC
  Administered 2023-09-21: 4500 mg via INTRAVENOUS
  Filled 2023-09-21: qty 90

## 2023-09-21 MED ORDER — OXALIPLATIN CHEMO INJECTION 100 MG/20ML
65.0000 mg/m2 | Freq: Once | INTRAVENOUS | Status: AC
  Administered 2023-09-21: 125 mg via INTRAVENOUS
  Filled 2023-09-21: qty 7.02

## 2023-09-21 NOTE — Progress Notes (Signed)
 Patient states she's feel okay today. She also would like to see if she can be cleared for her massage. She did have a GI upset this morning and took Lomotil  to help with that.
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
## 2023-09-21 NOTE — Progress Notes (Signed)
 [facility] CSW Progress Note  Visual merchandiser met with patient in infusion to follow-up on emotional support.    Interventions: Provided brief mental health counseling with regard to adjusting to her illness.  She has been visiting with her son and grandchildren recently.  They are young and active and Dorthea reports resting after they leave.  They remain very supportive.       Follow Up Plan:  CSW will follow-up with patient by phone     Macario CHRISTELLA Au, LCSW Clinical Social Worker [facility]

## 2023-09-21 NOTE — Patient Instructions (Signed)
 [facility] - A DEPT OF [facility]  Discharge Instructions: Thank you for choosing [facility] to provide your oncology and hematology care.  If you have a lab appointment with the Cancer Center, please go directly to the Cancer Center and check in at the registration area.  Wear comfortable clothing and clothing appropriate for easy access to any Portacath or PICC line.   We strive to give you quality time with your provider. You may need to reschedule your appointment if you arrive late (15 or more minutes).  Arriving late affects you and other patients whose appointments are after yours.  Also, if you miss three or more appointments without notifying the office, you may be dismissed from the clinic at the provider's discretion.      For prescription refill requests, have your pharmacy contact our office and allow 72 hours for refills to be completed.    Today you received the following chemotherapy and/or immunotherapy agents:  fluorouracil  / Irinotecan / leucovorin  /oxaliplatin    To help prevent nausea and vomiting after your treatment, we encourage you to take your nausea medication as directed.  BELOW ARE SYMPTOMS THAT SHOULD BE REPORTED IMMEDIATELY: *FEVER GREATER THAN 100.4 F (38 C) OR HIGHER *CHILLS OR SWEATING *NAUSEA AND VOMITING THAT IS NOT CONTROLLED WITH YOUR NAUSEA MEDICATION *UNUSUAL SHORTNESS OF BREATH *UNUSUAL BRUISING OR BLEEDING *URINARY PROBLEMS (pain or burning when urinating, or frequent urination) *BOWEL PROBLEMS (unusual diarrhea, constipation, pain near the anus) TENDERNESS IN MOUTH AND THROAT WITH OR WITHOUT PRESENCE OF ULCERS (sore throat, sores in mouth, or a toothache) UNUSUAL RASH, SWELLING OR PAIN  UNUSUAL VAGINAL DISCHARGE OR ITCHING   Items with * indicate a potential emergency and should be followed up as soon as possible or go to the Emergency Department if any problems should occur.  Please show the  CHEMOTHERAPY ALERT CARD or IMMUNOTHERAPY ALERT CARD at check-in to the Emergency Department and triage nurse.  Should you have questions after your visit or need to cancel or reschedule your appointment, please contact [facility] - A DEPT OF [facility]  [phone] and follow the prompts.  Office hours are 8:00 a.m. to 4:30 p.m. Monday - Friday. Please note that voicemails left after 4:00 p.m. may not be returned until the following business day.  We are closed weekends and major holidays. You have access to a nurse at all times for urgent questions. Please call the main number to the clinic [phone] and follow the prompts.  For any non-urgent questions, you may also contact your provider using MyChart. We now offer e-Visits for anyone 66 and older to request care online for non-urgent symptoms. For details visit [email or web address].   Also download the MyChart app! Go to the app store, search MyChart, open the app, select [place], and log in with your MyChart username and password.

## 2023-09-22 ENCOUNTER — Other Ambulatory Visit

## 2023-09-22 ENCOUNTER — Ambulatory Visit: Admitting: Oncology

## 2023-09-22 ENCOUNTER — Ambulatory Visit

## 2023-09-22 ENCOUNTER — Inpatient Hospital Stay

## 2023-09-22 LAB — CANCER ANTIGEN 19-9: CA 19-9: 314 U/mL — ABNORMAL HIGH (ref 0–35)

## 2023-09-22 NOTE — Progress Notes (Signed)
 Nutrition Follow-up:  Patient with pancreatic cancer.  Receiving modified folfox.    Spoke with patient via phone for follow-up.  Patient reports that her appetite and energy level was down due to low potassium.  It has improved since potassium level has improved.  Diarrhea is better with lomotil .  Had a ham and cheese omelette this am and 2 mini croissants.  Had soup with chicken and bone broth last night for dinner.  Drinking shakes.  Feels better overall    Medications: reviewed  Labs: reviewed  Anthropometrics:   Weight 171 lb 3.2 oz 172 lb 12.8 oz on 9/2 173 lb 8 oz on 8/18 175 lb on 7/21 171 lb on 5/30 181 lb on 3/4   NUTRITION DIAGNOSIS: Inadequate oral intake continues    INTERVENTION:  Continue foods rich in calories and protein to help maintain weight Continue antidiarrheal medications    MONITORING, EVALUATION, GOAL: weight trends, intake   NEXT VISIT: Tuesday, Oct 14 phone call  Kathleen Russell B. Dasie SOLON, CSO, LDN Registered Dietitian [phone]

## 2023-09-23 ENCOUNTER — Encounter: Payer: Self-pay | Admitting: Student in an Organized Health Care Education/Training Program

## 2023-09-23 ENCOUNTER — Inpatient Hospital Stay

## 2023-09-23 ENCOUNTER — Other Ambulatory Visit: Payer: Self-pay

## 2023-09-23 VITALS — BP 134/81

## 2023-09-23 DIAGNOSIS — Z5111 Encounter for antineoplastic chemotherapy: Secondary | ICD-10-CM | POA: Diagnosis not present

## 2023-09-23 DIAGNOSIS — C252 Malignant neoplasm of tail of pancreas: Secondary | ICD-10-CM

## 2023-09-23 MED ORDER — PEGFILGRASTIM-CBQV 6 MG/0.6ML ~~LOC~~ SOSY
6.0000 mg | PREFILLED_SYRINGE | Freq: Once | SUBCUTANEOUS | Status: AC
  Administered 2023-09-23: 6 mg via SUBCUTANEOUS

## 2023-09-24 ENCOUNTER — Encounter

## 2023-09-27 ENCOUNTER — Encounter: Payer: Self-pay | Admitting: Internal Medicine

## 2023-09-28 MED ORDER — FREESTYLE LIBRE 3 PLUS SENSOR MISC: 2 refills | Status: DC

## 2023-09-28 MED ORDER — PEN NEEDLES 32G X 6 MM MISC: 2 refills | Status: DC

## 2023-09-30 ENCOUNTER — Other Ambulatory Visit: Payer: Self-pay

## 2023-09-30 MED ORDER — ESOMEPRAZOLE MAGNESIUM 40 MG PO CPDR: DELAYED_RELEASE_CAPSULE | ORAL | 1 refills | Status: DC

## 2023-09-30 MED ORDER — FREESTYLE LIBRE 3 PLUS SENSOR MISC: 2 refills | Status: AC

## 2023-10-05 ENCOUNTER — Encounter: Payer: Self-pay | Admitting: Oncology

## 2023-10-05 ENCOUNTER — Inpatient Hospital Stay

## 2023-10-05 ENCOUNTER — Inpatient Hospital Stay: Admitting: Oncology

## 2023-10-05 VITALS — BP 135/82 | HR 105 | Temp 97.8°F | Resp 19 | Ht 64.0 in | Wt 171.9 lb

## 2023-10-05 VITALS — BP 131/81 | HR 99

## 2023-10-05 DIAGNOSIS — Z5111 Encounter for antineoplastic chemotherapy: Secondary | ICD-10-CM | POA: Diagnosis not present

## 2023-10-05 DIAGNOSIS — C252 Malignant neoplasm of tail of pancreas: Secondary | ICD-10-CM

## 2023-10-05 DIAGNOSIS — C259 Malignant neoplasm of pancreas, unspecified: Secondary | ICD-10-CM | POA: Diagnosis not present

## 2023-10-05 DIAGNOSIS — E876 Hypokalemia: Secondary | ICD-10-CM | POA: Diagnosis not present

## 2023-10-05 LAB — CBC WITH DIFFERENTIAL (CANCER CENTER ONLY)
Abs Immature Granulocytes: 0.17 K/uL — ABNORMAL HIGH (ref 0.00–0.07)
Basophils Absolute: 0.1 K/uL (ref 0.0–0.1)
Basophils Relative: 1 %
Eosinophils Absolute: 0 K/uL (ref 0.0–0.5)
Eosinophils Relative: 0 %
HCT: 36.2 % (ref 36.0–46.0)
Hemoglobin: 11.9 g/dL — ABNORMAL LOW (ref 12.0–15.0)
Immature Granulocytes: 2 %
Lymphocytes Relative: 18 %
Lymphs Abs: 1.9 K/uL (ref 0.7–4.0)
MCH: 28.3 pg (ref 26.0–34.0)
MCHC: 32.9 g/dL (ref 30.0–36.0)
MCV: 86 fL (ref 80.0–100.0)
Monocytes Absolute: 0.7 K/uL (ref 0.1–1.0)
Monocytes Relative: 7 %
Neutro Abs: 7.9 K/uL — ABNORMAL HIGH (ref 1.7–7.7)
Neutrophils Relative %: 72 %
Platelet Count: 130 K/uL — ABNORMAL LOW (ref 150–400)
RBC: 4.21 MIL/uL (ref 3.87–5.11)
RDW: 15.9 % — ABNORMAL HIGH (ref 11.5–15.5)
WBC Count: 10.7 K/uL — ABNORMAL HIGH (ref 4.0–10.5)
nRBC: 0.2 % (ref 0.0–0.2)

## 2023-10-05 LAB — CMP (CANCER CENTER ONLY)
ALT: 15 U/L (ref 0–44)
AST: 21 U/L (ref 15–41)
Albumin: 3.4 g/dL — ABNORMAL LOW (ref 3.5–5.0)
Alkaline Phosphatase: 121 U/L (ref 38–126)
Anion gap: 10 (ref 5–15)
BUN: 14 mg/dL (ref 8–23)
CO2: 23 mmol/L (ref 22–32)
Calcium: 8.5 mg/dL — ABNORMAL LOW (ref 8.9–10.3)
Chloride: 106 mmol/L (ref 98–111)
Creatinine: 0.65 mg/dL (ref 0.44–1.00)
GFR, Estimated: >60 mL/min (ref >60)
Glucose, Bld: 209 mg/dL — ABNORMAL HIGH (ref 70–99)
Potassium: 3 mmol/L — ABNORMAL LOW (ref 3.5–5.1)
Sodium: 139 mmol/L (ref 135–145)
Total Bilirubin: <0.2 mg/dL (ref 0.0–1.2)
Total Protein: 5.7 g/dL — ABNORMAL LOW (ref 6.5–8.1)

## 2023-10-05 MED ORDER — PALONOSETRON HCL INJECTION 0.25 MG/5ML
0.2500 mg | Freq: Once | INTRAVENOUS | Status: AC
  Administered 2023-10-05: 0.25 mg via INTRAVENOUS
  Filled 2023-10-05: qty 5

## 2023-10-05 MED ORDER — DEXTROSE 5 % IV SOLN
INTRAVENOUS | Status: DC
  Filled 2023-10-05: qty 250

## 2023-10-05 MED ORDER — ATROPINE SULFATE 1 MG/ML IV SOLN
0.5000 mg | Freq: Once | INTRAVENOUS | Status: AC | PRN
  Administered 2023-10-05: 0.5 mg via INTRAVENOUS
  Filled 2023-10-05: qty 1

## 2023-10-05 MED ORDER — SODIUM CHLORIDE 0.9 % IV SOLN
150.0000 mg/m2 | Freq: Once | INTRAVENOUS | Status: AC
  Administered 2023-10-05: 300 mg via INTRAVENOUS
  Filled 2023-10-05: qty 15

## 2023-10-05 MED ORDER — SODIUM CHLORIDE 0.9 % IV SOLN
4500.0000 mg | INTRAVENOUS | Status: DC
  Administered 2023-10-05: 4500 mg via INTRAVENOUS
  Filled 2023-10-05: qty 90

## 2023-10-05 MED ORDER — DEXAMETHASONE SODIUM PHOSPHATE 10 MG/ML IJ SOLN
10.0000 mg | Freq: Once | INTRAMUSCULAR | Status: AC
  Administered 2023-10-05: 10 mg via INTRAVENOUS
  Filled 2023-10-05: qty 1

## 2023-10-05 MED ORDER — OXALIPLATIN CHEMO INJECTION 100 MG/20ML
65.0000 mg/m2 | Freq: Once | INTRAVENOUS | Status: AC
  Administered 2023-10-05: 125 mg via INTRAVENOUS
  Filled 2023-10-05: qty 5

## 2023-10-05 MED ORDER — SODIUM CHLORIDE 0.9 % IV SOLN
400.0000 mg/m2 | Freq: Once | INTRAVENOUS | Status: AC
  Administered 2023-10-05: 760 mg via INTRAVENOUS
  Filled 2023-10-05: qty 20.5

## 2023-10-05 MED ORDER — ATROPINE SULFATE 1 MG/ML IV SOLN
0.5000 mg | Freq: Once | INTRAVENOUS | Status: AC
  Administered 2023-10-05: 0.5 mg via INTRAVENOUS
  Filled 2023-10-05: qty 1

## 2023-10-05 MED ORDER — APREPITANT 130 MG/18ML IV EMUL
130.0000 mg | Freq: Once | INTRAVENOUS | Status: AC
  Administered 2023-10-05: 130 mg via INTRAVENOUS
  Filled 2023-10-05: qty 18

## 2023-10-05 NOTE — Progress Notes (Signed)
 Patient states she has an upset stomach this morning, other than that no new or acute concerns.
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
## 2023-10-05 NOTE — Patient Instructions (Signed)
 [facility] - A DEPT OF [place]  Discharge Instructions: Thank you for choosing [facility] to provide your oncology and hematology care.  If you have a lab appointment with the Cancer Center, please go directly to the Cancer Center and check in at the registration area.  Wear comfortable clothing and clothing appropriate for easy access to any Portacath or PICC line.   We strive to give you quality time with your provider. You may need to reschedule your appointment if you arrive late (15 or more minutes).  Arriving late affects you and other patients whose appointments are after yours.  Also, if you miss three or more appointments without notifying the office, you may be dismissed from the clinic at the provider's discretion.      For prescription refill requests, have your pharmacy contact our office and allow 72 hours for refills to be completed.    Today you received the following chemotherapy and/or immunotherapy agents- oxaliplatin , irinotecan , leucovorin , 5FU      To help prevent nausea and vomiting after your treatment, we encourage you to take your nausea medication as directed.  BELOW ARE SYMPTOMS THAT SHOULD BE REPORTED IMMEDIATELY: *FEVER GREATER THAN 100.4 F (38 C) OR HIGHER *CHILLS OR SWEATING *NAUSEA AND VOMITING THAT IS NOT CONTROLLED WITH YOUR NAUSEA MEDICATION *UNUSUAL SHORTNESS OF BREATH *UNUSUAL BRUISING OR BLEEDING *URINARY PROBLEMS (pain or burning when urinating, or frequent urination) *BOWEL PROBLEMS (unusual diarrhea, constipation, pain near the anus) TENDERNESS IN MOUTH AND THROAT WITH OR WITHOUT PRESENCE OF ULCERS (sore throat, sores in mouth, or a toothache) UNUSUAL RASH, SWELLING OR PAIN  UNUSUAL VAGINAL DISCHARGE OR ITCHING   Items with * indicate a potential emergency and should be followed up as soon as possible or go to the Emergency Department if any problems should occur.  Please show the  CHEMOTHERAPY ALERT CARD or IMMUNOTHERAPY ALERT CARD at check-in to the Emergency Department and triage nurse.  Should you have questions after your visit or need to cancel or reschedule your appointment, please contact [facility] - A DEPT OF [facility]  [phone] and follow the prompts.  Office hours are 8:00 a.m. to 4:30 p.m. Monday - Friday. Please note that voicemails left after 4:00 p.m. may not be returned until the following business day.  We are closed weekends and major holidays. You have access to a nurse at all times for urgent questions. Please call the main number to the clinic [phone] and follow the prompts.  For any non-urgent questions, you may also contact your provider using MyChart. We now offer e-Visits for anyone 62 and older to request care online for non-urgent symptoms. For details visit [email or web address].   Also download the MyChart app! Go to the app store, search MyChart, open the app, select [place], and log in with your MyChart username and password.

## 2023-10-05 NOTE — Progress Notes (Signed)
 During irinotecan  infusion, pt voiced abdomin cramping. Denied any diarrhea. Per Dr. Melanee, may repeat atropine  0.5mg . Dose repeated, improvement in symptoms noted.

## 2023-10-06 LAB — CANCER ANTIGEN 19-9: CA 19-9: 314 U/mL — ABNORMAL HIGH (ref 0–35)

## 2023-10-07 ENCOUNTER — Inpatient Hospital Stay: Attending: Oncology

## 2023-10-07 ENCOUNTER — Other Ambulatory Visit: Payer: Self-pay

## 2023-10-07 VITALS — BP 139/80 | HR 90 | Temp 98.0°F | Resp 18

## 2023-10-07 DIAGNOSIS — K521 Toxic gastroenteritis and colitis: Secondary | ICD-10-CM | POA: Diagnosis not present

## 2023-10-07 DIAGNOSIS — E876 Hypokalemia: Secondary | ICD-10-CM | POA: Diagnosis not present

## 2023-10-07 DIAGNOSIS — Z452 Encounter for adjustment and management of vascular access device: Secondary | ICD-10-CM | POA: Diagnosis not present

## 2023-10-07 DIAGNOSIS — Z5111 Encounter for antineoplastic chemotherapy: Secondary | ICD-10-CM | POA: Insufficient documentation

## 2023-10-07 DIAGNOSIS — D6959 Other secondary thrombocytopenia: Secondary | ICD-10-CM | POA: Insufficient documentation

## 2023-10-07 DIAGNOSIS — C787 Secondary malignant neoplasm of liver and intrahepatic bile duct: Secondary | ICD-10-CM | POA: Insufficient documentation

## 2023-10-07 DIAGNOSIS — D701 Agranulocytosis secondary to cancer chemotherapy: Secondary | ICD-10-CM | POA: Diagnosis not present

## 2023-10-07 DIAGNOSIS — C252 Malignant neoplasm of tail of pancreas: Secondary | ICD-10-CM | POA: Diagnosis not present

## 2023-10-07 MED ORDER — PEGFILGRASTIM-CBQV 6 MG/0.6ML ~~LOC~~ SOSY
6.0000 mg | PREFILLED_SYRINGE | Freq: Once | SUBCUTANEOUS | Status: AC
  Administered 2023-10-07: 6 mg via SUBCUTANEOUS
  Filled 2023-10-07: qty 0.6

## 2023-10-08 DIAGNOSIS — C259 Malignant neoplasm of pancreas, unspecified: Secondary | ICD-10-CM | POA: Diagnosis not present

## 2023-10-08 DIAGNOSIS — C252 Malignant neoplasm of tail of pancreas: Secondary | ICD-10-CM | POA: Diagnosis not present

## 2023-10-09 ENCOUNTER — Other Ambulatory Visit

## 2023-10-09 ENCOUNTER — Other Ambulatory Visit: Payer: Self-pay | Admitting: Internal Medicine

## 2023-10-09 DIAGNOSIS — E785 Hyperlipidemia, unspecified: Secondary | ICD-10-CM | POA: Diagnosis not present

## 2023-10-09 DIAGNOSIS — E669 Obesity, unspecified: Secondary | ICD-10-CM

## 2023-10-09 DIAGNOSIS — E119 Type 2 diabetes mellitus without complications: Secondary | ICD-10-CM

## 2023-10-09 NOTE — Addendum Note (Signed)
 Addended by: MARYLEN PRO A on: 10/09/2023 09:12 AM   Modules accepted: Orders

## 2023-10-10 ENCOUNTER — Encounter: Payer: Self-pay | Admitting: Oncology

## 2023-10-10 LAB — LIPID PANEL
Chol/HDL Ratio: 1.6 ratio (ref 0.0–4.4)
Cholesterol, Total: 143 mg/dL (ref 100–199)
HDL: 87 mg/dL (ref >39)
LDL Chol Calc (NIH): 40 mg/dL (ref 0–99)
Triglycerides: 89 mg/dL (ref 0–149)
VLDL Cholesterol Cal: 16 mg/dL (ref 5–40)

## 2023-10-10 LAB — LDL CHOLESTEROL, DIRECT: LDL Direct: 40 mg/dL (ref 0–99)

## 2023-10-10 LAB — COMPREHENSIVE METABOLIC PANEL WITH GFR
ALT: 13 IU/L (ref 0–32)
AST: 12 IU/L (ref 0–40)
Albumin: 3.9 g/dL (ref 3.9–4.9)
Alkaline Phosphatase: 180 IU/L — ABNORMAL HIGH (ref 49–135)
BUN/Creatinine Ratio: 35 — ABNORMAL HIGH (ref 12–28)
BUN: 19 mg/dL (ref 8–27)
Bilirubin Total: 0.5 mg/dL (ref 0.0–1.2)
CO2: 24 mmol/L (ref 20–29)
Calcium: 8.7 mg/dL (ref 8.7–10.3)
Chloride: 101 mmol/L (ref 96–106)
Creatinine, Ser: 0.54 mg/dL — ABNORMAL LOW (ref 0.57–1.00)
Globulin, Total: 1.4 g/dL — ABNORMAL LOW (ref 1.5–4.5)
Glucose: 136 mg/dL — ABNORMAL HIGH (ref 70–99)
Potassium: 3.5 mmol/L (ref 3.5–5.2)
Sodium: 138 mmol/L (ref 134–144)
Total Protein: 5.3 g/dL — ABNORMAL LOW (ref 6.0–8.5)
eGFR: 100 mL/min/1.73 (ref >59)

## 2023-10-10 LAB — HEMOGLOBIN A1C
Est. average glucose Bld gHb Est-mCnc: 206 mg/dL
Hgb A1c MFr Bld: 8.8 % — ABNORMAL HIGH (ref 4.8–5.6)

## 2023-10-11 ENCOUNTER — Ambulatory Visit: Payer: Self-pay | Admitting: Internal Medicine

## 2023-10-12 ENCOUNTER — Encounter: Payer: Self-pay | Admitting: Oncology

## 2023-10-13 ENCOUNTER — Ambulatory Visit: Admitting: Internal Medicine

## 2023-10-13 ENCOUNTER — Encounter: Payer: Self-pay | Admitting: Internal Medicine

## 2023-10-13 VITALS — BP 128/76 | HR 104 | Ht 64.0 in | Wt 167.8 lb

## 2023-10-13 DIAGNOSIS — E785 Hyperlipidemia, unspecified: Secondary | ICD-10-CM | POA: Diagnosis not present

## 2023-10-13 DIAGNOSIS — E669 Obesity, unspecified: Secondary | ICD-10-CM | POA: Diagnosis not present

## 2023-10-13 DIAGNOSIS — E119 Type 2 diabetes mellitus without complications: Secondary | ICD-10-CM | POA: Diagnosis not present

## 2023-10-13 DIAGNOSIS — C259 Malignant neoplasm of pancreas, unspecified: Secondary | ICD-10-CM

## 2023-10-13 DIAGNOSIS — Z794 Long term (current) use of insulin: Secondary | ICD-10-CM

## 2023-10-13 DIAGNOSIS — E538 Deficiency of other specified B group vitamins: Secondary | ICD-10-CM

## 2023-10-13 DIAGNOSIS — Z23 Encounter for immunization: Secondary | ICD-10-CM | POA: Diagnosis not present

## 2023-10-13 MED ORDER — CYANOCOBALAMIN 1000 MCG/ML IJ SOLN
1000.0000 ug | Freq: Once | INTRAMUSCULAR | Status: AC
  Administered 2023-10-13: 1000 ug via INTRAMUSCULAR

## 2023-10-13 NOTE — Patient Instructions (Addendum)
 New insulin  regimen for the next 2 weeks    5 units of Lantus  on NON STEROID DAYS  plus Januvia    30 units of lantus  on STEROID DAYS  plus Januvia     Use Dill pickle juice to replace sodium  without adding sugar    If the b12 injection gives you a boost of energy,  you can return for weekly injections

## 2023-10-13 NOTE — Progress Notes (Signed)
 Subjective:  Patient ID: Kathleen Russell, female    DOB: 28-Aug-1955  Age: 68 y.o. MRN: [id number]  CC: The primary encounter diagnosis was Hyperlipidemia LDL goal <70. Diagnoses of Type 2 diabetes mellitus without complication, with long-term current use of insulin  (HCC), Vitamin B12 deficiency, Flu vaccine need, Type 2 diabetes mellitus in patient with obesity (HCC), and Malignant neoplasm of pancreas, unspecified location of malignancy [facility]) were also pertinent to this visit.   HPI Kathleen Russell presents for  Chief Complaint  Patient presents with   Medical Management of Chronic Issues    3 month follow up     T2DM:   she feels generally fatigued due to ongoing chemotherapy for pancreatic CA . checking blood sugars once daily at variable times using the Freestyle CBG .  Taking januvia  and adding 15  units of Lantus  .  I have downloaded and reviewed the data from patient's continuous blood glucose monitor  for  the period  of  Sept 24 to Oct 7   Patient's  sugars have been  IN RANGE    34 % OF THE TIME,   ABOVE RANGE  66  % of the time.  Average blood glucose is   208  with a variability of  27% and anticipated A1c of 8.3.  Patient's medications currently used for glycemic control are    Januva 25 mg daily and  and increased dose of Lantus  to 24 units       Patient reports compliance with medication approximately  100  % of the time.      Has been having idarrhea,  more than  usual the week of chemotherapy, managed with imodium  , titrating the dose as needed . Using electrolyte replacement drinks : gatorade, pedialyte,      Outpatient Medications Prior to Visit  Medication Sig Dispense Refill   acetaminophen  (TYLENOL ) 500 MG tablet Take by mouth.     blood glucose meter kit and supplies Dispense based on patient and insurance preference. Use up to four times daily as directed. (FOR ICD-10 E10.9, E11.9). 1 each 0   Continuous Glucose Sensor (FREESTYLE LIBRE 3 PLUS SENSOR) MISC Change  sensor every 15 days. 2 each 2   dexamethasone  (DECADRON ) 4 MG tablet Take 2 tablets (8 mg total) by mouth daily. Take 2 tablets daily x 3 days starting the day after chemotherapy. Take with food. 30 tablet 1   diphenoxylate -atropine  (LOMOTIL ) 2.5-0.025 MG tablet Take 1 tablet by mouth 4 (four) times daily as needed for diarrhea or loose stools. 30 tablet 0   estradiol  (ESTRACE ) 0.1 MG/GM vaginal cream INSERT 1 APPLICATORFUL VAGINALLY 3 TIMES A WEEK 42.5 g 12   fluticasone  (FLONASE ) 50 MCG/ACT nasal spray SHAKE LIQUID AND USE 2 SPRAYS IN EACH NOSTRIL AT BEDTIME AS NEEDED 16 g 5   insulin  glargine (LANTUS  SOLOSTAR) 100 UNIT/ML Solostar Pen Inject 15 Units into the skin daily. 15 mL PRN   Insulin  Pen Needle (PEN NEEDLES) 32G X 6 MM MISC Use to give insulin  100 each 2   JANUVIA  25 MG tablet TAKE 1 TABLET(25 MG) BY MOUTH DAILY 90 tablet 1   levocetirizine (XYZAL ALLERGY 24HR) 5 MG tablet      lidocaine -prilocaine  (EMLA ) cream Apply to affected area once 30 g 3   loperamide  (IMODIUM ) 2 MG capsule Take 2 tabs by mouth with first loose stool, then 1 tab with each additional loose stool as needed. Do not exceed 8 tabs in a 24-hour period  60 capsule 3   magic mouthwash (multi-ingredient) oral suspension Swish and swallow 5-10 mLs 4 (four) times daily. 480 mL 3   metroNIDAZOLE  (METROGEL ) 1 % gel Apply topically daily. 45 g 0   ondansetron  (ZOFRAN ) 8 MG tablet Take 1 tablet (8 mg total) by mouth every 8 (eight) hours as needed for nausea or vomiting. Start on the third day after chemotherapy 30 tablet 1   pantoprazole  (PROTONIX ) 20 MG tablet Take 1 tablet (20 mg total) by mouth daily. 30 tablet 1   prochlorperazine  (COMPAZINE ) 10 MG tablet Take 1 tablet (10 mg total) by mouth every 6 (six) hours as needed for nausea or vomiting (Nausea or vomiting). 30 tablet 1   Propylene Glycol (SYSTANE COMPLETE OP) Apply to eye.     rosuvastatin  (CRESTOR ) 20 MG tablet TAKE 1 TABLET(20 MG) BY MOUTH DAILY 90 tablet 1    triamcinolone  ointment (KENALOG ) 0.5 % Apply once a day as needed 30 g 0   VENTOLIN  HFA 108 (90 Base) MCG/ACT inhaler Inhale 2 puffs into the lungs every 6 (six) hours as needed. 1 each 1   diclofenac Sodium (VOLTAREN) 1 % GEL Apply topically 4 (four) times daily. (Patient not taking: Reported on 10/13/2023)     esomeprazole  (NEXIUM ) 40 MG capsule TAKE 1 CAPSULE BY MOUTH DAILY BEFORE BREAKFAST (Patient not taking: Reported on 10/13/2023) 90 capsule 1   magic mouthwash (multi-ingredient) oral suspension Swish and spit 5-10 mLs 4 (four) times daily as needed. 480 mL 3   magic mouthwash (multi-ingredient) oral suspension Swish and swallow 5-10 mLs 4 (four) times daily. 480 mL 3   potassium chloride  SA (KLOR-CON  M) 20 MEQ tablet Take 1 tablet (20 mEq total) by mouth once for 1 dose. 14 tablet 0   potassium chloride  SA (KLOR-CON  M) 20 MEQ tablet Take 1 tablet (20 mEq total) by mouth daily. 14 tablet 0   Facility-Administered Medications Prior to Visit  Medication Dose Route Frequency Provider Last Rate Last Admin   dextrose  5 % solution   Intravenous Continuous Melanee Annah BROCKS, MD 10 mL/hr at 05/08/23 0909 New Bag at 05/08/23 0909    Review of Systems;  Patient denies headache, fevers, malaise, unintentional weight loss, skin rash, eye pain, sinus congestion and sinus pain, sore throat, dysphagia,  hemoptysis , cough, dyspnea, wheezing, chest pain, palpitations, orthopnea, edema, abdominal pain, nausea, melena, diarrhea, constipation, flank pain, dysuria, hematuria, urinary  Frequency, nocturia, numbness, tingling, seizures,  Focal weakness, Loss of consciousness,  Tremor, insomnia, depression, anxiety, and suicidal ideation.      Objective:  BP 128/76   Pulse (!) 104   Ht 5' 4 (1.626 m)   Wt 167 lb 12.8 oz (76.1 kg)   SpO2 97%   BMI 28.80 kg/m   BP Readings from Last 3 Encounters:  10/13/23 128/76  10/07/23 139/80  10/05/23 131/81    Wt Readings from Last 3 Encounters:  10/13/23 167 lb  12.8 oz (76.1 kg)  10/05/23 171 lb 14.4 oz (78 kg)  09/21/23 171 lb 3.2 oz (77.7 kg)    Physical Exam Vitals reviewed.  Constitutional:      General: She is not in acute distress.    Appearance: Normal appearance. She is normal weight. She is not ill-appearing, toxic-appearing or diaphoretic.  HENT:     Head: Normocephalic.  Eyes:     General: No scleral icterus.       Right eye: No discharge.        Left eye: No discharge.  Conjunctiva/sclera: Conjunctivae normal.  Cardiovascular:     Rate and Rhythm: Normal rate and regular rhythm.     Heart sounds: Normal heart sounds.  Pulmonary:     Effort: Pulmonary effort is normal. No respiratory distress.     Breath sounds: Normal breath sounds.  Musculoskeletal:        General: Normal range of motion.  Skin:    General: Skin is warm and dry.  Neurological:     General: No focal deficit present.     Mental Status: She is alert and oriented to person, place, and time. Mental status is at baseline.  Psychiatric:        Mood and Affect: Mood normal.        Behavior: Behavior normal.        Thought Content: Thought content normal.        Judgment: Judgment normal.     Lab Results  Component Value Date   HGBA1C 8.8 (H) 10/09/2023   HGBA1C 7.8 (H) 07/06/2023   HGBA1C 7.4 (H) 02/09/2023    Lab Results  Component Value Date   CREATININE 0.54 (L) 10/09/2023   CREATININE 0.65 10/05/2023   CREATININE 0.71 09/21/2023    Lab Results  Component Value Date   WBC 10.7 (H) 10/05/2023   HGB 11.9 (L) 10/05/2023   HCT 36.2 10/05/2023   PLT 130 (L) 10/05/2023   GLUCOSE 136 (H) 10/09/2023   CHOL 143 10/09/2023   TRIG 89 10/09/2023   HDL 87 10/09/2023   LDLDIRECT 40 10/09/2023   LDLCALC 40 10/09/2023   ALT 13 10/09/2023   AST 12 10/09/2023   NA 138 10/09/2023   K 3.5 10/09/2023   CL 101 10/09/2023   CREATININE 0.54 (L) 10/09/2023   BUN 19 10/09/2023   CO2 24 10/09/2023   TSH 2.506 06/18/2023   INR 1.2 06/18/2023   HGBA1C  8.8 (H) 10/09/2023   MICROALBUR <0.7 03/10/2023    No results found.  Assessment & Plan:  .Hyperlipidemia LDL goal <70 -     Lipid panel; Future -     LDL cholesterol, direct; Future  Type 2 diabetes mellitus without complication, with long-term current use of insulin  (HCC) -     Comprehensive metabolic panel with GFR; Future -     Hemoglobin A1c; Future  Vitamin B12 deficiency -     Cyanocobalamin  Flu vaccine need -     Flu vaccine HIGH DOSE PF(Fluzone Trivalent)  Type 2 diabetes mellitus in patient with obesity (HCC) Assessment & Plan: She has significant hyperglycemia  on days she takes steroids  for 3 days during each week of chemotherapy. Advised to resume 5 units Lantus  on normal days,  30 units on Steroid days , and continue januvia     Malignant neoplasm of pancreas, unspecified location of malignancy [facility]) Assessment & Plan: She has resumed chemotherapy regimen following resolution of  MSSA bacteremia from portacath.  She has had metastatic nodules found in her liver, and is no longer a surgical candidate    I personally spent a total of 30 minutes in the care of the patient today including preparing to see the patient, getting/reviewing separately obtained history, counseling and educating, placing orders, and documenting clinical information in the EHR. Follow-up: Return in about 1 month (around 11/13/2023) for follow up diabetes.   Kathleen LITTIE Kettering, MD

## 2023-10-13 NOTE — Assessment & Plan Note (Signed)
 She has significant hyperglycemia  on days she takes steroids  for 3 days during each week of chemotherapy. Advised to resume 5 units Lantus  on normal days,  30 units on Steroid days , and continue januvia 

## 2023-10-13 NOTE — Assessment & Plan Note (Signed)
 She has resumed chemotherapy regimen following resolution of  MSSA bacteremia from portacath.  She has had metastatic nodules found in her liver, and is no longer a surgical candidate

## 2023-10-19 ENCOUNTER — Encounter: Payer: Self-pay | Admitting: Oncology

## 2023-10-19 ENCOUNTER — Inpatient Hospital Stay

## 2023-10-19 ENCOUNTER — Other Ambulatory Visit: Payer: Self-pay

## 2023-10-19 ENCOUNTER — Other Ambulatory Visit: Payer: Self-pay | Admitting: Oncology

## 2023-10-19 ENCOUNTER — Inpatient Hospital Stay: Admitting: Oncology

## 2023-10-19 VITALS — BP 127/86 | HR 106 | Temp 96.0°F | Resp 18 | Ht 64.0 in | Wt 172.0 lb

## 2023-10-19 VITALS — BP 153/88 | HR 101

## 2023-10-19 DIAGNOSIS — E876 Hypokalemia: Secondary | ICD-10-CM | POA: Diagnosis not present

## 2023-10-19 DIAGNOSIS — C259 Malignant neoplasm of pancreas, unspecified: Secondary | ICD-10-CM | POA: Diagnosis not present

## 2023-10-19 DIAGNOSIS — C252 Malignant neoplasm of tail of pancreas: Secondary | ICD-10-CM

## 2023-10-19 DIAGNOSIS — Z5111 Encounter for antineoplastic chemotherapy: Secondary | ICD-10-CM

## 2023-10-19 LAB — CMP (CANCER CENTER ONLY)
ALT: 16 U/L (ref 0–44)
AST: 17 U/L (ref 15–41)
Albumin: 3.4 g/dL — ABNORMAL LOW (ref 3.5–5.0)
Alkaline Phosphatase: 134 U/L — ABNORMAL HIGH (ref 38–126)
Anion gap: 7 (ref 5–15)
BUN: 12 mg/dL (ref 8–23)
CO2: 25 mmol/L (ref 22–32)
Calcium: 8.6 mg/dL — ABNORMAL LOW (ref 8.9–10.3)
Chloride: 105 mmol/L (ref 98–111)
Creatinine: 0.73 mg/dL (ref 0.44–1.00)
GFR, Estimated: >60 mL/min (ref >60)
Glucose, Bld: 166 mg/dL — ABNORMAL HIGH (ref 70–99)
Potassium: 3.3 mmol/L — ABNORMAL LOW (ref 3.5–5.1)
Sodium: 137 mmol/L (ref 135–145)
Total Bilirubin: 0.4 mg/dL (ref 0.0–1.2)
Total Protein: 6.1 g/dL — ABNORMAL LOW (ref 6.5–8.1)

## 2023-10-19 LAB — CBC WITH DIFFERENTIAL (CANCER CENTER ONLY)
Abs Immature Granulocytes: 0.25 K/uL — ABNORMAL HIGH (ref 0.00–0.07)
Basophils Absolute: 0.1 K/uL (ref 0.0–0.1)
Basophils Relative: 0 %
Eosinophils Absolute: 0 K/uL (ref 0.0–0.5)
Eosinophils Relative: 0 %
HCT: 36.2 % (ref 36.0–46.0)
Hemoglobin: 11.7 g/dL — ABNORMAL LOW (ref 12.0–15.0)
Immature Granulocytes: 2 %
Lymphocytes Relative: 17 %
Lymphs Abs: 2 K/uL (ref 0.7–4.0)
MCH: 27.9 pg (ref 26.0–34.0)
MCHC: 32.3 g/dL (ref 30.0–36.0)
MCV: 86.4 fL (ref 80.0–100.0)
Monocytes Absolute: 0.6 K/uL (ref 0.1–1.0)
Monocytes Relative: 5 %
Neutro Abs: 8.5 K/uL — ABNORMAL HIGH (ref 1.7–7.7)
Neutrophils Relative %: 76 %
Platelet Count: 117 K/uL — ABNORMAL LOW (ref 150–400)
RBC: 4.19 MIL/uL (ref 3.87–5.11)
RDW: 16.7 % — ABNORMAL HIGH (ref 11.5–15.5)
WBC Count: 11.4 K/uL — ABNORMAL HIGH (ref 4.0–10.5)
nRBC: 0.3 % — ABNORMAL HIGH (ref 0.0–0.2)

## 2023-10-19 MED ORDER — ATROPINE SULFATE 1 MG/ML IV SOLN
0.5000 mg | Freq: Once | INTRAVENOUS | Status: AC | PRN
  Administered 2023-10-19: 0.5 mg via INTRAVENOUS
  Filled 2023-10-19: qty 1

## 2023-10-19 MED ORDER — APREPITANT 130 MG/18ML IV EMUL
130.0000 mg | Freq: Once | INTRAVENOUS | Status: AC
  Administered 2023-10-19: 130 mg via INTRAVENOUS
  Filled 2023-10-19: qty 18

## 2023-10-19 MED ORDER — SODIUM CHLORIDE 0.9 % IV SOLN
Freq: Once | INTRAVENOUS | Status: AC
  Filled 2023-10-19: qty 250

## 2023-10-19 MED ORDER — DEXAMETHASONE SOD PHOSPHATE PF 10 MG/ML IJ SOLN
10.0000 mg | Freq: Once | INTRAMUSCULAR | Status: AC
  Administered 2023-10-19: 10 mg via INTRAVENOUS

## 2023-10-19 MED ORDER — PALONOSETRON HCL INJECTION 0.25 MG/5ML
0.2500 mg | Freq: Once | INTRAVENOUS | Status: AC
  Administered 2023-10-19: 0.25 mg via INTRAVENOUS
  Filled 2023-10-19: qty 5

## 2023-10-19 MED ORDER — DIPHENOXYLATE-ATROPINE 2.5-0.025 MG PO TABS: 1.0000 | ORAL_TABLET | Freq: Four times a day (QID) | ORAL | 2 refills | Status: DC | PRN

## 2023-10-19 MED ORDER — SODIUM CHLORIDE 0.9 % IV SOLN
150.0000 mg/m2 | Freq: Once | INTRAVENOUS | Status: AC
  Administered 2023-10-19: 300 mg via INTRAVENOUS
  Filled 2023-10-19: qty 15

## 2023-10-19 MED ORDER — SODIUM CHLORIDE 0.9 % IV SOLN
4500.0000 mg | INTRAVENOUS | Status: DC
  Administered 2023-10-19: 4500 mg via INTRAVENOUS
  Filled 2023-10-19: qty 90

## 2023-10-19 MED ORDER — POTASSIUM CHLORIDE CRYS ER 10 MEQ PO TBCR: 10.0000 meq | EXTENDED_RELEASE_TABLET | Freq: Every day | ORAL | 1 refills | Status: DC

## 2023-10-19 MED ORDER — OXALIPLATIN CHEMO INJECTION 100 MG/20ML
65.0000 mg/m2 | Freq: Once | INTRAVENOUS | Status: AC
  Administered 2023-10-19: 125 mg via INTRAVENOUS
  Filled 2023-10-19: qty 5

## 2023-10-19 MED ORDER — SODIUM CHLORIDE 0.9 % IV SOLN
400.0000 mg/m2 | Freq: Once | INTRAVENOUS | Status: AC
  Administered 2023-10-19: 760 mg via INTRAVENOUS
  Filled 2023-10-19: qty 25

## 2023-10-19 MED ORDER — DEXTROSE 5 % IV SOLN
INTRAVENOUS | Status: DC
  Filled 2023-10-19: qty 250

## 2023-10-19 NOTE — Progress Notes (Signed)
 Patient doing well. No new or acute concerns at this time.

## 2023-10-19 NOTE — Patient Instructions (Signed)
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
## 2023-10-20 ENCOUNTER — Encounter: Payer: Self-pay | Admitting: Oncology

## 2023-10-20 ENCOUNTER — Other Ambulatory Visit: Payer: Self-pay

## 2023-10-20 ENCOUNTER — Inpatient Hospital Stay

## 2023-10-20 NOTE — Progress Notes (Signed)
 Nutrition Follow-up:  Patient with pancreatic cancer.  Receiving modified folfox.  Spoke with patient via phone.  Reports that she is feeling pretty good after infusion yesterday.  Slept good last night.  Has gotten tried of protein shakes so has switched to protein bars or yogurt for bedtime snack.  Drinking liquid IV more so than gatorade.  Pasta with meat and vegetables, omelettes with cheese and meat or eggs with cheese are working well.  Had a mini bagel with cream cheese this am.  Felt queasy last night but felt better after taking nausea medicine.  Also had diarrhea yesterday.     Medications: reviewed  Labs: reviewed  Anthropometrics:   Weight 172 lb on 10/13 171 lb 3. 2 oz on 9/16 172 lb 12.8 oz on 9/2 173 lb 8 oz on 8/18 175 lb on 7/21 171 lb on 5/30 181 lb on 3/4   NUTRITION DIAGNOSIS: Inadequate oral intake improving, weight stable    INTERVENTION:  Continue current eating pattern as maintaining weight     MONITORING, EVALUATION, GOAL: weight trends, intake   NEXT VISIT: Wed, Nov 12 after pump removal  Emanuelle Bastos B. Dasie SOLON, CSO, LDN Registered Dietitian [phone]

## 2023-10-20 NOTE — Telephone Encounter (Signed)
 Spoke to Kathleen Russell who indicated insurance prefers 90 day supply be sent; resent script to accommodate 90 day requirement.  Per Kathleen Russell no further action needed at this time.

## 2023-10-21 ENCOUNTER — Inpatient Hospital Stay

## 2023-10-21 ENCOUNTER — Other Ambulatory Visit: Payer: Self-pay

## 2023-10-22 ENCOUNTER — Encounter: Payer: Self-pay | Admitting: Oncology

## 2023-10-22 DIAGNOSIS — C252 Malignant neoplasm of tail of pancreas: Secondary | ICD-10-CM

## 2023-10-22 DIAGNOSIS — E876 Hypokalemia: Secondary | ICD-10-CM

## 2023-10-23 DIAGNOSIS — L821 Other seborrheic keratosis: Secondary | ICD-10-CM | POA: Diagnosis not present

## 2023-10-23 DIAGNOSIS — L718 Other rosacea: Secondary | ICD-10-CM | POA: Diagnosis not present

## 2023-10-23 DIAGNOSIS — Z08 Encounter for follow-up examination after completed treatment for malignant neoplasm: Secondary | ICD-10-CM | POA: Diagnosis not present

## 2023-10-23 DIAGNOSIS — Z85828 Personal history of other malignant neoplasm of skin: Secondary | ICD-10-CM | POA: Diagnosis not present

## 2023-10-23 DIAGNOSIS — D1801 Hemangioma of skin and subcutaneous tissue: Secondary | ICD-10-CM | POA: Diagnosis not present

## 2023-10-23 DIAGNOSIS — L570 Actinic keratosis: Secondary | ICD-10-CM | POA: Diagnosis not present

## 2023-10-27 MED ORDER — POTASSIUM CHLORIDE CRYS ER 10 MEQ PO TBCR: 10.0000 meq | EXTENDED_RELEASE_TABLET | Freq: Every day | ORAL | 0 refills | Status: DC

## 2023-10-29 ENCOUNTER — Other Ambulatory Visit: Payer: Self-pay | Admitting: Oncology

## 2023-10-29 DIAGNOSIS — C252 Malignant neoplasm of tail of pancreas: Secondary | ICD-10-CM

## 2023-10-30 ENCOUNTER — Other Ambulatory Visit: Payer: Self-pay | Admitting: Oncology

## 2023-11-02 ENCOUNTER — Encounter: Payer: Self-pay | Admitting: Oncology

## 2023-11-02 ENCOUNTER — Inpatient Hospital Stay

## 2023-11-02 ENCOUNTER — Inpatient Hospital Stay: Admitting: Oncology

## 2023-11-02 VITALS — BP 121/82 | HR 100 | Temp 97.8°F | Resp 19 | Ht 64.0 in | Wt 168.5 lb

## 2023-11-02 DIAGNOSIS — T451X5A Adverse effect of antineoplastic and immunosuppressive drugs, initial encounter: Secondary | ICD-10-CM

## 2023-11-02 DIAGNOSIS — E876 Hypokalemia: Secondary | ICD-10-CM | POA: Diagnosis not present

## 2023-11-02 DIAGNOSIS — C252 Malignant neoplasm of tail of pancreas: Secondary | ICD-10-CM

## 2023-11-02 DIAGNOSIS — Z5111 Encounter for antineoplastic chemotherapy: Secondary | ICD-10-CM

## 2023-11-02 DIAGNOSIS — C259 Malignant neoplasm of pancreas, unspecified: Secondary | ICD-10-CM | POA: Diagnosis not present

## 2023-11-02 DIAGNOSIS — D701 Agranulocytosis secondary to cancer chemotherapy: Secondary | ICD-10-CM | POA: Diagnosis not present

## 2023-11-02 LAB — CBC WITH DIFFERENTIAL (CANCER CENTER ONLY)
Abs Immature Granulocytes: 0.01 K/uL (ref 0.00–0.07)
Basophils Absolute: 0 K/uL (ref 0.0–0.1)
Basophils Relative: 1 %
Eosinophils Absolute: 0 K/uL (ref 0.0–0.5)
Eosinophils Relative: 1 %
HCT: 36.5 % (ref 36.0–46.0)
Hemoglobin: 11.9 g/dL — ABNORMAL LOW (ref 12.0–15.0)
Immature Granulocytes: 0 %
Lymphocytes Relative: 38 %
Lymphs Abs: 1.4 K/uL (ref 0.7–4.0)
MCH: 28.3 pg (ref 26.0–34.0)
MCHC: 32.6 g/dL (ref 30.0–36.0)
MCV: 86.9 fL (ref 80.0–100.0)
Monocytes Absolute: 0.5 K/uL (ref 0.1–1.0)
Monocytes Relative: 14 %
Neutro Abs: 1.7 K/uL (ref 1.7–7.7)
Neutrophils Relative %: 46 %
Platelet Count: 136 K/uL — ABNORMAL LOW (ref 150–400)
RBC: 4.2 MIL/uL (ref 3.87–5.11)
RDW: 17 % — ABNORMAL HIGH (ref 11.5–15.5)
WBC Count: 3.7 K/uL — ABNORMAL LOW (ref 4.0–10.5)
nRBC: 0 % (ref 0.0–0.2)

## 2023-11-02 LAB — CMP (CANCER CENTER ONLY)
ALT: 19 U/L (ref 0–44)
AST: 20 U/L (ref 15–41)
Albumin: 3.8 g/dL (ref 3.5–5.0)
Alkaline Phosphatase: 76 U/L (ref 38–126)
Anion gap: 8 (ref 5–15)
BUN: 11 mg/dL (ref 8–23)
CO2: 22 mmol/L (ref 22–32)
Calcium: 8.7 mg/dL — ABNORMAL LOW (ref 8.9–10.3)
Chloride: 107 mmol/L (ref 98–111)
Creatinine: 0.72 mg/dL (ref 0.44–1.00)
GFR, Estimated: >60 mL/min (ref >60)
Glucose, Bld: 204 mg/dL — ABNORMAL HIGH (ref 70–99)
Potassium: 3.2 mmol/L — ABNORMAL LOW (ref 3.5–5.1)
Sodium: 137 mmol/L (ref 135–145)
Total Bilirubin: 0.7 mg/dL (ref 0.0–1.2)
Total Protein: 6.2 g/dL — ABNORMAL LOW (ref 6.5–8.1)

## 2023-11-02 MED ORDER — SODIUM CHLORIDE 0.9 % IV SOLN
150.0000 mg/m2 | Freq: Once | INTRAVENOUS | Status: AC
  Administered 2023-11-02: 300 mg via INTRAVENOUS
  Filled 2023-11-02: qty 15

## 2023-11-02 MED ORDER — PALONOSETRON HCL INJECTION 0.25 MG/5ML
0.2500 mg | Freq: Once | INTRAVENOUS | Status: AC
  Administered 2023-11-02: 0.25 mg via INTRAVENOUS
  Filled 2023-11-02: qty 5

## 2023-11-02 MED ORDER — OXALIPLATIN CHEMO INJECTION 100 MG/20ML
65.0000 mg/m2 | Freq: Once | INTRAVENOUS | Status: AC
  Administered 2023-11-02: 125 mg via INTRAVENOUS
  Filled 2023-11-02: qty 25

## 2023-11-02 MED ORDER — SODIUM CHLORIDE 0.9 % IV SOLN
4500.0000 mg | INTRAVENOUS | Status: DC
  Administered 2023-11-02: 4500 mg via INTRAVENOUS
  Filled 2023-11-02: qty 90

## 2023-11-02 MED ORDER — APREPITANT 130 MG/18ML IV EMUL
130.0000 mg | Freq: Once | INTRAVENOUS | Status: AC
  Administered 2023-11-02: 130 mg via INTRAVENOUS
  Filled 2023-11-02: qty 18

## 2023-11-02 MED ORDER — ATROPINE SULFATE 1 MG/ML IV SOLN
0.5000 mg | Freq: Once | INTRAVENOUS | Status: AC | PRN
  Administered 2023-11-02: 0.5 mg via INTRAVENOUS
  Filled 2023-11-02: qty 1

## 2023-11-02 MED ORDER — DEXAMETHASONE SOD PHOSPHATE PF 10 MG/ML IJ SOLN
10.0000 mg | Freq: Once | INTRAMUSCULAR | Status: AC
  Administered 2023-11-02: 10 mg via INTRAVENOUS

## 2023-11-02 MED ORDER — SODIUM CHLORIDE 0.9 % IV SOLN
400.0000 mg/m2 | Freq: Once | INTRAVENOUS | Status: AC
  Administered 2023-11-02: 760 mg via INTRAVENOUS
  Filled 2023-11-02: qty 25

## 2023-11-02 MED ORDER — DEXTROSE 5 % IV SOLN
INTRAVENOUS | Status: DC
  Filled 2023-11-02: qty 250
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
## 2023-11-02 NOTE — Patient Instructions (Signed)

## 2023-11-02 NOTE — Patient Instructions (Signed)
 [facility] - A DEPT OF [facility]  Discharge Instructions: Thank you for choosing [facility] to provide your oncology and hematology care.  If you have a lab appointment with the Cancer Center, please go directly to the Cancer Center and check in at the registration area.  Wear comfortable clothing and clothing appropriate for easy access to any Portacath or PICC line.   We strive to give you quality time with your provider. You may need to reschedule your appointment if you arrive late (15 or more minutes).  Arriving late affects you and other patients whose appointments are after yours.  Also, if you miss three or more appointments without notifying the office, you may be dismissed from the clinic at the provider's discretion.      For prescription refill requests, have your pharmacy contact our office and allow 72 hours for refills to be completed.    Today you received the following chemotherapy and/or immunotherapy agents- oxaliplatin , leucovorin , irinotecan , 5FU      To help prevent nausea and vomiting after your treatment, we encourage you to take your nausea medication as directed.  BELOW ARE SYMPTOMS THAT SHOULD BE REPORTED IMMEDIATELY: *FEVER GREATER THAN 100.4 F (38 C) OR HIGHER *CHILLS OR SWEATING *NAUSEA AND VOMITING THAT IS NOT CONTROLLED WITH YOUR NAUSEA MEDICATION *UNUSUAL SHORTNESS OF BREATH *UNUSUAL BRUISING OR BLEEDING *URINARY PROBLEMS (pain or burning when urinating, or frequent urination) *BOWEL PROBLEMS (unusual diarrhea, constipation, pain near the anus) TENDERNESS IN MOUTH AND THROAT WITH OR WITHOUT PRESENCE OF ULCERS (sore throat, sores in mouth, or a toothache) UNUSUAL RASH, SWELLING OR PAIN  UNUSUAL VAGINAL DISCHARGE OR ITCHING   Items with * indicate a potential emergency and should be followed up as soon as possible or go to the Emergency Department if any problems should occur.  Please show the  CHEMOTHERAPY ALERT CARD or IMMUNOTHERAPY ALERT CARD at check-in to the Emergency Department and triage nurse.  Should you have questions after your visit or need to cancel or reschedule your appointment, please contact [facility] - A DEPT OF [facility]  [phone] and follow the prompts.  Office hours are 8:00 a.m. to 4:30 p.m. Monday - Friday. Please note that voicemails left after 4:00 p.m. may not be returned until the following business day.  We are closed weekends and major holidays. You have access to a nurse at all times for urgent questions. Please call the main number to the clinic [phone] and follow the prompts.  For any non-urgent questions, you may also contact your provider using MyChart. We now offer e-Visits for anyone 59 and older to request care online for non-urgent symptoms. For details visit [email or web address].   Also download the MyChart app! Go to the app store, search MyChart, open the app, select [facility], and log in with your MyChart username and password.

## 2023-11-02 NOTE — Progress Notes (Signed)
 Patient states she had loose stools last Thursday & Friday, she also had some nausea & vomiting on Saturday. Today, she's feeling good & back to normal. She thinks she got a hold to some bad food.

## 2023-11-03 LAB — CANCER ANTIGEN 19-9: CA 19-9: 296 U/mL — ABNORMAL HIGH (ref 0–35)

## 2023-11-04 ENCOUNTER — Inpatient Hospital Stay

## 2023-11-04 ENCOUNTER — Telehealth: Payer: Self-pay | Admitting: Oncology

## 2023-11-04 ENCOUNTER — Encounter: Payer: Self-pay | Admitting: Oncology

## 2023-11-04 ENCOUNTER — Other Ambulatory Visit: Payer: Self-pay | Admitting: Oncology

## 2023-11-04 ENCOUNTER — Other Ambulatory Visit: Payer: Self-pay

## 2023-11-04 VITALS — BP 117/62 | HR 89 | Temp 97.7°F | Resp 18

## 2023-11-04 DIAGNOSIS — C252 Malignant neoplasm of tail of pancreas: Secondary | ICD-10-CM

## 2023-11-04 DIAGNOSIS — Z5111 Encounter for antineoplastic chemotherapy: Secondary | ICD-10-CM | POA: Diagnosis not present

## 2023-11-04 MED ORDER — STERILE WATER FOR INJECTION IJ SOLN
5.0000 mL | Freq: Four times a day (QID) | OROMUCOSAL | 3 refills | Status: DC
  Filled 2023-11-04: qty 480, 12d supply, fill #0
  Filled 2023-11-18: qty 480, 12d supply, fill #1
  Filled 2023-11-29: qty 480, 12d supply, fill #2
  Filled 2023-12-15: qty 480, 12d supply, fill #3

## 2023-11-04 MED ORDER — PEGFILGRASTIM-CBQV 6 MG/0.6ML ~~LOC~~ SOSY
6.0000 mg | PREFILLED_SYRINGE | Freq: Once | SUBCUTANEOUS | Status: AC
  Administered 2023-11-04: 6 mg via SUBCUTANEOUS
  Filled 2023-11-04: qty 0.6

## 2023-11-04 NOTE — Telephone Encounter (Signed)
 Called pt to sched CT - left vm to call back - LH

## 2023-11-04 NOTE — Telephone Encounter (Signed)
 Outbound call to pharmacy to clarify if additional information needed due to message received with refill request P  - IVR/NO REF/CALL DR/MSG/1of1.  Spoke to Megan in pharmacy who indicated she could not see message; informed just needed a refill sent.

## 2023-11-04 NOTE — Progress Notes (Signed)
 Brief Nutrition Follow-up  Patient with pancreatic cancer.  Received modified folfox.    Met with patient following pump removal. Seen in infusion room.  Patient reports last week had anniversary and her and husband had diarrhea which set her back a little bit.  Feeling better and eating better while on steriods.  Mixing fairlife shakes with milk   Weight 168 lb 8 oz on 10/27 172 lb on 10/13 167 lb on 10/7 171 lb on 9/15 173 lb 8 oz on 8/18   Intervention Continue oral nutrition supplements and foods as tolerated .  Next visit: Nov 12  Neita Landrigan B. Dasie SOLON, CSO, LDN Registered Dietitian [phone]

## 2023-11-11 ENCOUNTER — Encounter: Payer: Self-pay | Admitting: Oncology

## 2023-11-12 ENCOUNTER — Ambulatory Visit (INDEPENDENT_AMBULATORY_CARE_PROVIDER_SITE_OTHER): Admitting: Internal Medicine

## 2023-11-12 ENCOUNTER — Encounter: Payer: Self-pay | Admitting: Internal Medicine

## 2023-11-12 VITALS — BP 122/74 | HR 117 | Ht 64.0 in | Wt 167.8 lb

## 2023-11-12 DIAGNOSIS — Z794 Long term (current) use of insulin: Secondary | ICD-10-CM | POA: Diagnosis not present

## 2023-11-12 DIAGNOSIS — E785 Hyperlipidemia, unspecified: Secondary | ICD-10-CM

## 2023-11-12 DIAGNOSIS — E119 Type 2 diabetes mellitus without complications: Secondary | ICD-10-CM

## 2023-11-12 DIAGNOSIS — Z6828 Body mass index (BMI) 28.0-28.9, adult: Secondary | ICD-10-CM | POA: Diagnosis not present

## 2023-11-12 DIAGNOSIS — E669 Obesity, unspecified: Secondary | ICD-10-CM

## 2023-11-12 MED ORDER — ROSUVASTATIN CALCIUM 20 MG PO TABS
ORAL_TABLET | ORAL | 1 refills | Status: AC
Start: 2023-11-12 — End: ?

## 2023-11-12 MED ORDER — LANTUS SOLOSTAR 100 UNIT/ML ~~LOC~~ SOPN: PEN_INJECTOR | SUBCUTANEOUS | 99 refills | Status: DC

## 2023-11-12 NOTE — Patient Instructions (Addendum)
 Increase your dose of lantus  to 35 units during steroid days  Increase your lantus  to 10 units on the non treatment days    Send me a reminder in 2 weeks to review your blood sugars   Repeat labs not due until after the New Year   Lab Results  Component Value Date   HGBA1C 8.8 (H) 10/09/2023

## 2023-11-12 NOTE — Progress Notes (Unsigned)
 Subjective:  Patient ID: Kathleen Russell, female    DOB: 1955-11-22  Age: 68 y.o. MRN: [id number]  CC: The primary encounter diagnosis was Type 2 diabetes mellitus in patient with obesity (HCC). A diagnosis of Hyperlipidemia LDL goal <160 was also pertinent to this visit.   HPI Kathleen Russell presents for  Chief Complaint  Patient presents with   Medical Management of Chronic Issues    1 month follow up on diabetes    1) TYPE  2 DM:  Kathleen Russell is a 68 yr old female with Type 2 DM,  recently diagnosed,  who is under treatment for pancreatic CA.  During treatment cycles she has been currently taking  30 units of Lantus  on the days of chemo and steroids (3 days every week)   and 25 mg januvia   , and 5 units on all other days. She finished  her most recent  chemotherapy cycle on oct 27.  Felt poorly for a new days but recovered wihile taking a weekend in [facility]   r Sugars have been elevated without any any prandial variation   Outpatient Medications Prior to Visit  Medication Sig Dispense Refill   acetaminophen  (TYLENOL ) 500 MG tablet Take by mouth.     blood glucose meter kit and supplies Dispense based on patient and insurance preference. Use up to four times daily as directed. (FOR ICD-10 E10.9, E11.9). 1 each 0   Continuous Glucose Sensor (FREESTYLE LIBRE 3 PLUS SENSOR) MISC Change sensor every 15 days. 2 each 2   dexamethasone  (DECADRON ) 4 MG tablet TAKE 2 TABLET BY MOUTH EVERY DAY FOR 3 DAYS STARTING DAY AFTER CHEMOTHERPAY. TAKE WITH FOOD 30 tablet 1   diclofenac Sodium (VOLTAREN) 1 % GEL Apply topically 4 (four) times daily.     diphenoxylate -atropine  (LOMOTIL ) 2.5-0.025 MG tablet Take 1 tablet by mouth 4 (four) times daily as needed for diarrhea or loose stools. 30 tablet 0   diphenoxylate -atropine  (LOMOTIL ) 2.5-0.025 MG tablet Take 1 tablet by mouth 4 (four) times daily as needed for diarrhea or loose stools. 30 tablet 2   estradiol  (ESTRACE ) 0.1 MG/GM vaginal cream INSERT 1  APPLICATORFUL VAGINALLY 3 TIMES A WEEK 42.5 g 12   fluticasone  (FLONASE ) 50 MCG/ACT nasal spray SHAKE LIQUID AND USE 2 SPRAYS IN EACH NOSTRIL AT BEDTIME AS NEEDED 16 g 5   Insulin  Pen Needle (PEN NEEDLES) 32G X 6 MM MISC Use to give insulin  100 each 2   JANUVIA  25 MG tablet TAKE 1 TABLET(25 MG) BY MOUTH DAILY 90 tablet 1   levocetirizine (XYZAL ALLERGY 24HR) 5 MG tablet      lidocaine -prilocaine  (EMLA ) cream Apply to affected area once 30 g 3   loperamide  (IMODIUM ) 2 MG capsule Take 2 tabs by mouth with first loose stool, then 1 tab with each additional loose stool as needed. Do not exceed 8 tabs in a 24-hour period 60 capsule 3   magic mouthwash (multi-ingredient) oral suspension Swish and swallow 5-10 mLs 4 (four) times daily. 480 mL 3   metroNIDAZOLE  (METROGEL ) 1 % gel Apply topically daily. 45 g 0   ondansetron  (ZOFRAN ) 8 MG tablet Take 1 tablet (8 mg total) by mouth every 8 (eight) hours as needed for nausea or vomiting. Start on the third day after chemotherapy 30 tablet 1   pantoprazole  (PROTONIX ) 20 MG tablet TAKE 1 TABLET(20 MG) BY MOUTH DAILY 90 tablet 0   potassium chloride  (KLOR-CON  M) 10 MEQ tablet Take 1 tablet (10 mEq total)  by mouth daily. 90 tablet 0   prochlorperazine  (COMPAZINE ) 10 MG tablet Take 1 tablet (10 mg total) by mouth every 6 (six) hours as needed for nausea or vomiting (Nausea or vomiting). 30 tablet 1   Propylene Glycol (SYSTANE COMPLETE OP) Apply to eye.     triamcinolone  ointment (KENALOG ) 0.5 % Apply once a day as needed 30 g 0   VENTOLIN  HFA 108 (90 Base) MCG/ACT inhaler Inhale 2 puffs into the lungs every 6 (six) hours as needed. 1 each 1   insulin  glargine (LANTUS  SOLOSTAR) 100 UNIT/ML Solostar Pen Inject 15 Units into the skin daily. 15 mL PRN   rosuvastatin  (CRESTOR ) 20 MG tablet TAKE 1 TABLET(20 MG) BY MOUTH DAILY 90 tablet 1   Facility-Administered Medications Prior to Visit  Medication Dose Route Frequency Provider Last Rate Last Admin   dextrose  5 %  solution   Intravenous Continuous Melanee Annah BROCKS, MD 10 mL/hr at 05/08/23 0909 New Bag at 05/08/23 0909    Review of Systems;  Patient denies headache, fevers, malaise, unintentional weight loss, skin rash, eye pain, sinus congestion and sinus pain, sore throat, dysphagia,  hemoptysis , cough, dyspnea, wheezing, chest pain, palpitations, orthopnea, edema, abdominal pain, nausea, melena, diarrhea, constipation, flank pain, dysuria, hematuria, urinary  Frequency, nocturia, numbness, tingling, seizures,  Focal weakness, Loss of consciousness,  Tremor, insomnia, depression, anxiety, and suicidal ideation.      Objective:  BP 122/74   Pulse (!) 117   Ht 5' 4 (1.626 m)   Wt 167 lb 12.8 oz (76.1 kg)   SpO2 97%   BMI 28.80 kg/m   BP Readings from Last 3 Encounters:  11/12/23 122/74  11/04/23 117/62  11/02/23 121/82    Wt Readings from Last 3 Encounters:  11/12/23 167 lb 12.8 oz (76.1 kg)  11/02/23 168 lb 8 oz (76.4 kg)  10/19/23 172 lb (78 kg)    Physical Exam Vitals reviewed.  Constitutional:      General: She is not in acute distress.    Appearance: Normal appearance. She is normal weight. She is not ill-appearing, toxic-appearing or diaphoretic.  HENT:     Head: Normocephalic.  Eyes:     General: No scleral icterus.       Right eye: No discharge.        Left eye: No discharge.     Conjunctiva/sclera: Conjunctivae normal.  Cardiovascular:     Rate and Rhythm: Normal rate and regular rhythm.     Heart sounds: Normal heart sounds.  Pulmonary:     Effort: Pulmonary effort is normal. No respiratory distress.     Breath sounds: Normal breath sounds.  Musculoskeletal:        General: Normal range of motion.  Skin:    General: Skin is warm and dry.  Neurological:     General: No focal deficit present.     Mental Status: She is alert and oriented to person, place, and time. Mental status is at baseline.  Psychiatric:        Mood and Affect: Mood normal.        Behavior:  Behavior normal.        Thought Content: Thought content normal.        Judgment: Judgment normal.     Lab Results  Component Value Date   HGBA1C 8.8 (H) 10/09/2023   HGBA1C 7.8 (H) 07/06/2023   HGBA1C 7.4 (H) 02/09/2023    Lab Results  Component Value Date   CREATININE 0.72 11/02/2023  CREATININE 0.73 10/19/2023   CREATININE 0.54 (L) 10/09/2023    Lab Results  Component Value Date   WBC 3.7 (L) 11/02/2023   HGB 11.9 (L) 11/02/2023   HCT 36.5 11/02/2023   PLT 136 (L) 11/02/2023   GLUCOSE 204 (H) 11/02/2023   CHOL 143 10/09/2023   TRIG 89 10/09/2023   HDL 87 10/09/2023   LDLDIRECT 40 10/09/2023   LDLCALC 40 10/09/2023   ALT 19 11/02/2023   AST 20 11/02/2023   NA 137 11/02/2023   K 3.2 (L) 11/02/2023   CL 107 11/02/2023   CREATININE 0.72 11/02/2023   BUN 11 11/02/2023   CO2 22 11/02/2023   TSH 2.506 06/18/2023   INR 1.2 06/18/2023   HGBA1C 8.8 (H) 10/09/2023   MICROALBUR <0.7 03/10/2023    No results found.  Assessment & Plan:  .Type 2 diabetes mellitus in patient with obesity [facility]) Assessment & Plan: Elevations during steroid use/chemotheraphy not controlled with 30 units of basal insulin .  Will increase dose to 35 units of basal insulin  on treatment days and increase dose on all other days to 10 units    Hyperlipidemia LDL goal <160 -     Rosuvastatin  Calcium ; TAKE 1 TABLET(20 MG) BY MOUTH DAILY  Dispense: 90 tablet; Refill: 1  Other orders -     Lantus  SoloStar; 30 units on steroid days,  10 units all other days  Dispense: 15 mL; Refill: PRN    Follow-up: Return in about 2 months (around 01/12/2024) for follow up diabetes.   Verneita LITTIE Kettering, MD

## 2023-11-13 ENCOUNTER — Telehealth: Payer: Self-pay

## 2023-11-13 NOTE — Telephone Encounter (Signed)
 Copied from CRM [id number]. Topic: Clinical - Medication Question >> Nov 13, 2023  9:54 AM China J wrote: Reason for CRM: A pharmacy calling needing clarification. The patient's lantus  solostar dosage depends on steroid days and he was wondering how often she was taking the increased dose so he can properly calculate further.  Please call the pharmacy for an update and further clarification at: [phone]

## 2023-11-13 NOTE — Assessment & Plan Note (Addendum)
 Elevations during steroid use/chemotheraphy not controlled with 30 units of basal insulin .  Will increase dose to 35 units of basal insulin  on treatment days and increase dose on all other days to 10 units

## 2023-11-13 NOTE — Telephone Encounter (Signed)
Pharmacist was notified and verbalized understanding.

## 2023-11-16 ENCOUNTER — Encounter: Payer: Self-pay | Admitting: Oncology

## 2023-11-16 ENCOUNTER — Inpatient Hospital Stay: Admitting: Oncology

## 2023-11-16 ENCOUNTER — Inpatient Hospital Stay: Attending: Oncology

## 2023-11-16 ENCOUNTER — Inpatient Hospital Stay

## 2023-11-16 VITALS — BP 135/77 | HR 96

## 2023-11-16 VITALS — BP 117/82 | HR 104 | Temp 96.9°F | Resp 19 | Ht 64.0 in | Wt 170.7 lb

## 2023-11-16 DIAGNOSIS — C252 Malignant neoplasm of tail of pancreas: Secondary | ICD-10-CM

## 2023-11-16 DIAGNOSIS — G620 Drug-induced polyneuropathy: Secondary | ICD-10-CM | POA: Diagnosis not present

## 2023-11-16 DIAGNOSIS — C787 Secondary malignant neoplasm of liver and intrahepatic bile duct: Secondary | ICD-10-CM | POA: Diagnosis not present

## 2023-11-16 DIAGNOSIS — R5383 Other fatigue: Secondary | ICD-10-CM | POA: Insufficient documentation

## 2023-11-16 DIAGNOSIS — D6959 Other secondary thrombocytopenia: Secondary | ICD-10-CM | POA: Insufficient documentation

## 2023-11-16 DIAGNOSIS — Z5111 Encounter for antineoplastic chemotherapy: Secondary | ICD-10-CM | POA: Diagnosis not present

## 2023-11-16 DIAGNOSIS — T451X5A Adverse effect of antineoplastic and immunosuppressive drugs, initial encounter: Secondary | ICD-10-CM

## 2023-11-16 DIAGNOSIS — Z452 Encounter for adjustment and management of vascular access device: Secondary | ICD-10-CM | POA: Insufficient documentation

## 2023-11-16 DIAGNOSIS — Z5189 Encounter for other specified aftercare: Secondary | ICD-10-CM | POA: Diagnosis not present

## 2023-11-16 LAB — CMP (CANCER CENTER ONLY)
ALT: 18 U/L (ref 0–44)
AST: 20 U/L (ref 15–41)
Albumin: 3.5 g/dL (ref 3.5–5.0)
Alkaline Phosphatase: 103 U/L (ref 38–126)
Anion gap: 7 (ref 5–15)
BUN: 10 mg/dL (ref 8–23)
CO2: 24 mmol/L (ref 22–32)
Calcium: 8.3 mg/dL — ABNORMAL LOW (ref 8.9–10.3)
Chloride: 105 mmol/L (ref 98–111)
Creatinine: 0.7 mg/dL (ref 0.44–1.00)
GFR, Estimated: >60 mL/min (ref >60)
Glucose, Bld: 207 mg/dL — ABNORMAL HIGH (ref 70–99)
Potassium: 3.7 mmol/L (ref 3.5–5.1)
Sodium: 136 mmol/L (ref 135–145)
Total Bilirubin: 0.6 mg/dL (ref 0.0–1.2)
Total Protein: 6.1 g/dL — ABNORMAL LOW (ref 6.5–8.1)

## 2023-11-16 LAB — CBC WITH DIFFERENTIAL (CANCER CENTER ONLY)
Abs Immature Granulocytes: 0.16 K/uL — ABNORMAL HIGH (ref 0.00–0.07)
Basophils Absolute: 0 K/uL (ref 0.0–0.1)
Basophils Relative: 0 %
Eosinophils Absolute: 0 K/uL (ref 0.0–0.5)
Eosinophils Relative: 0 %
HCT: 34.8 % — ABNORMAL LOW (ref 36.0–46.0)
Hemoglobin: 11.3 g/dL — ABNORMAL LOW (ref 12.0–15.0)
Immature Granulocytes: 2 %
Lymphocytes Relative: 20 %
Lymphs Abs: 1.7 K/uL (ref 0.7–4.0)
MCH: 29.2 pg (ref 26.0–34.0)
MCHC: 32.5 g/dL (ref 30.0–36.0)
MCV: 89.9 fL (ref 80.0–100.0)
Monocytes Absolute: 0.5 K/uL (ref 0.1–1.0)
Monocytes Relative: 6 %
Neutro Abs: 6 K/uL (ref 1.7–7.7)
Neutrophils Relative %: 72 %
Platelet Count: 115 K/uL — ABNORMAL LOW (ref 150–400)
RBC: 3.87 MIL/uL (ref 3.87–5.11)
RDW: 17.5 % — ABNORMAL HIGH (ref 11.5–15.5)
WBC Count: 8.4 K/uL (ref 4.0–10.5)
nRBC: 0.2 % (ref 0.0–0.2)

## 2023-11-16 MED ORDER — SODIUM CHLORIDE 0.9 % IV SOLN
Freq: Once | INTRAVENOUS | Status: AC
  Filled 2023-11-16: qty 250

## 2023-11-16 MED ORDER — SODIUM CHLORIDE 0.9 % IV SOLN
150.0000 mg/m2 | Freq: Once | INTRAVENOUS | Status: AC
  Administered 2023-11-16: 300 mg via INTRAVENOUS
  Filled 2023-11-16: qty 15

## 2023-11-16 MED ORDER — DEXTROSE 5 % IV SOLN
INTRAVENOUS | Status: DC
  Filled 2023-11-16 (×2): qty 250

## 2023-11-16 MED ORDER — ATROPINE SULFATE 1 MG/ML IV SOLN
0.5000 mg | Freq: Once | INTRAVENOUS | Status: AC | PRN
  Administered 2023-11-16: 0.5 mg via INTRAVENOUS
  Filled 2023-11-16: qty 1

## 2023-11-16 MED ORDER — OXALIPLATIN CHEMO INJECTION 100 MG/20ML
65.0000 mg/m2 | Freq: Once | INTRAVENOUS | Status: AC
  Administered 2023-11-16: 125 mg via INTRAVENOUS
  Filled 2023-11-16: qty 25

## 2023-11-16 MED ORDER — DEXAMETHASONE SOD PHOSPHATE PF 10 MG/ML IJ SOLN
10.0000 mg | Freq: Once | INTRAMUSCULAR | Status: AC
  Administered 2023-11-16: 10 mg via INTRAVENOUS

## 2023-11-16 MED ORDER — APREPITANT 130 MG/18ML IV EMUL
130.0000 mg | Freq: Once | INTRAVENOUS | Status: AC
  Administered 2023-11-16: 130 mg via INTRAVENOUS
  Filled 2023-11-16: qty 18

## 2023-11-16 MED ORDER — SODIUM CHLORIDE 0.9 % IV SOLN
4500.0000 mg | INTRAVENOUS | Status: DC
  Administered 2023-11-16: 4500 mg via INTRAVENOUS
  Filled 2023-11-16: qty 90

## 2023-11-16 MED ORDER — SODIUM CHLORIDE 0.9 % IV SOLN
400.0000 mg/m2 | Freq: Once | INTRAVENOUS | Status: AC
  Administered 2023-11-16: 760 mg via INTRAVENOUS
  Filled 2023-11-16: qty 25

## 2023-11-16 MED ORDER — PALONOSETRON HCL INJECTION 0.25 MG/5ML
0.2500 mg | Freq: Once | INTRAVENOUS | Status: AC
  Administered 2023-11-16: 0.25 mg via INTRAVENOUS
  Filled 2023-11-16: qty 5

## 2023-11-16 NOTE — Progress Notes (Signed)
 Patient feeling well with no new or acute concerns today.
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
## 2023-11-17 LAB — CANCER ANTIGEN 19-9: CA 19-9: 263 U/mL — ABNORMAL HIGH (ref 0–35)

## 2023-11-18 ENCOUNTER — Inpatient Hospital Stay

## 2023-11-18 ENCOUNTER — Encounter: Payer: Self-pay | Admitting: Oncology

## 2023-11-18 ENCOUNTER — Other Ambulatory Visit: Payer: Self-pay

## 2023-11-18 VITALS — BP 126/80 | HR 88 | Temp 98.4°F | Resp 18

## 2023-11-18 DIAGNOSIS — Z5111 Encounter for antineoplastic chemotherapy: Secondary | ICD-10-CM | POA: Diagnosis not present

## 2023-11-18 DIAGNOSIS — C252 Malignant neoplasm of tail of pancreas: Secondary | ICD-10-CM

## 2023-11-18 MED ORDER — PEGFILGRASTIM-CBQV 6 MG/0.6ML ~~LOC~~ SOSY
6.0000 mg | PREFILLED_SYRINGE | Freq: Once | SUBCUTANEOUS | Status: AC
  Administered 2023-11-18: 6 mg via SUBCUTANEOUS
  Filled 2023-11-18: qty 0.6

## 2023-11-18 NOTE — Progress Notes (Addendum)
 Brief Nutrition Note:  RD unable to see patient at pump removal today as RD in meeting and did not want patient to wait.    Tried to call patient times 2 for follow-up.  Left message with contact number.   Sarissa Dern B. Dasie SOLON, CSO, LDN Registered Dietitian [phone]

## 2023-11-19 ENCOUNTER — Inpatient Hospital Stay

## 2023-11-19 NOTE — Progress Notes (Signed)
 Nutrition Follow-up:  Patient with pancreatic cancer.  Receiving modified folfox.  Spoke with patient via phone for nutrition follow-up.  Feeling fatigued as received WBC shot yesterday after pump removal.  Reports that appetite is doing ok. Drinking Fairlife shakes mixed with milk, protein bars, eggs, omelettes with cheese and sausage.  For lunch today had beef, noodle, tomato sauce and cheese casserole.  Had a beef stew with vegetables earlier in the week.  Drinking liquid IV and gatorade.  Ate a peanut butter sandwich at 10pm last night.      Medications: reviewed  Labs: glucose 207  Anthropometrics:   Weight 170 lb 11.2 oz on 11/10 172 lb on 10/13 171 lb 3.2 oz on 9/16 172 lb 12.8 oz on 9/2 173 lb on 8 oz on 8/18 175 lb on 7/21 171 lb on 5/30   NUTRITION DIAGNOSIS: Inadequate oral intake improving, weight stable    INTERVENTION:  Continue foods rich in protein and calories Continue Fairlife shake mixed with milk    MONITORING, EVALUATION, GOAL: weight trends, intake   NEXT VISIT: Wed, Dec 10 pump removal  Mackinze Criado B. Dasie SOLON, CSO, LDN Registered Dietitian [phone]

## 2023-11-23 ENCOUNTER — Ambulatory Visit
Admission: RE | Admit: 2023-11-23 | Discharge: 2023-11-23 | Disposition: A | Discharge status: Home / Self Care | Source: Ambulatory Visit | Attending: Oncology | Admitting: Oncology

## 2023-11-23 DIAGNOSIS — K8689 Other specified diseases of pancreas: Secondary | ICD-10-CM | POA: Diagnosis not present

## 2023-11-23 DIAGNOSIS — C252 Malignant neoplasm of tail of pancreas: Secondary | ICD-10-CM | POA: Diagnosis not present

## 2023-11-23 DIAGNOSIS — K769 Liver disease, unspecified: Secondary | ICD-10-CM | POA: Diagnosis not present

## 2023-11-23 MED ORDER — IOHEXOL 300 MG/ML  SOLN
100.0000 mL | Freq: Once | INTRAMUSCULAR | Status: AC | PRN
  Administered 2023-11-23: 100 mL via INTRAVENOUS

## 2023-11-24 ENCOUNTER — Encounter: Payer: Self-pay | Admitting: Internal Medicine

## 2023-11-30 ENCOUNTER — Inpatient Hospital Stay

## 2023-11-30 ENCOUNTER — Inpatient Hospital Stay: Admitting: Nurse Practitioner

## 2023-11-30 ENCOUNTER — Other Ambulatory Visit: Payer: Self-pay

## 2023-11-30 ENCOUNTER — Encounter: Payer: Self-pay | Admitting: Oncology

## 2023-11-30 ENCOUNTER — Encounter: Payer: Self-pay | Admitting: Nurse Practitioner

## 2023-11-30 VITALS — BP 125/74 | HR 100 | Resp 16

## 2023-11-30 VITALS — BP 136/86 | HR 109 | Temp 97.0°F | Resp 17 | Ht 64.0 in | Wt 170.5 lb

## 2023-11-30 DIAGNOSIS — C252 Malignant neoplasm of tail of pancreas: Secondary | ICD-10-CM

## 2023-11-30 DIAGNOSIS — Z5111 Encounter for antineoplastic chemotherapy: Secondary | ICD-10-CM

## 2023-11-30 LAB — CMP (CANCER CENTER ONLY)
ALT: 17 U/L (ref 0–44)
AST: 18 U/L (ref 15–41)
Albumin: 3.6 g/dL (ref 3.5–5.0)
Alkaline Phosphatase: 121 U/L (ref 38–126)
Anion gap: 9 (ref 5–15)
BUN: 14 mg/dL (ref 8–23)
CO2: 23 mmol/L (ref 22–32)
Calcium: 8.8 mg/dL — ABNORMAL LOW (ref 8.9–10.3)
Chloride: 105 mmol/L (ref 98–111)
Creatinine: 0.76 mg/dL (ref 0.44–1.00)
GFR, Estimated: >60 mL/min (ref >60)
Glucose, Bld: 238 mg/dL — ABNORMAL HIGH (ref 70–99)
Potassium: 3.6 mmol/L (ref 3.5–5.1)
Sodium: 137 mmol/L (ref 135–145)
Total Bilirubin: 0.4 mg/dL (ref 0.0–1.2)
Total Protein: 6.2 g/dL — ABNORMAL LOW (ref 6.5–8.1)

## 2023-11-30 LAB — CBC WITH DIFFERENTIAL (CANCER CENTER ONLY)
Abs Immature Granulocytes: 0.17 K/uL — ABNORMAL HIGH (ref 0.00–0.07)
Basophils Absolute: 0 K/uL (ref 0.0–0.1)
Basophils Relative: 0 %
Eosinophils Absolute: 0 K/uL (ref 0.0–0.5)
Eosinophils Relative: 0 %
HCT: 34.8 % — ABNORMAL LOW (ref 36.0–46.0)
Hemoglobin: 11.6 g/dL — ABNORMAL LOW (ref 12.0–15.0)
Immature Granulocytes: 2 %
Lymphocytes Relative: 15 %
Lymphs Abs: 1.5 K/uL (ref 0.7–4.0)
MCH: 29.9 pg (ref 26.0–34.0)
MCHC: 33.3 g/dL (ref 30.0–36.0)
MCV: 89.7 fL (ref 80.0–100.0)
Monocytes Absolute: 0.6 K/uL (ref 0.1–1.0)
Monocytes Relative: 5 %
Neutro Abs: 7.9 K/uL — ABNORMAL HIGH (ref 1.7–7.7)
Neutrophils Relative %: 78 %
Platelet Count: 119 K/uL — ABNORMAL LOW (ref 150–400)
RBC: 3.88 MIL/uL (ref 3.87–5.11)
RDW: 16.5 % — ABNORMAL HIGH (ref 11.5–15.5)
WBC Count: 10.2 K/uL (ref 4.0–10.5)
nRBC: 0.4 % — ABNORMAL HIGH (ref 0.0–0.2)

## 2023-11-30 MED ORDER — ATROPINE SULFATE 1 MG/ML IV SOLN
0.5000 mg | Freq: Once | INTRAVENOUS | Status: AC
  Administered 2023-11-30: 0.5 mg via INTRAVENOUS

## 2023-11-30 MED ORDER — ATROPINE SULFATE 1 MG/ML IV SOLN
0.5000 mg | Freq: Once | INTRAVENOUS | Status: AC | PRN
  Administered 2023-11-30: 0.5 mg via INTRAVENOUS
  Filled 2023-11-30: qty 1

## 2023-11-30 MED ORDER — SODIUM CHLORIDE 0.9 % IV SOLN
150.0000 mg/m2 | Freq: Once | INTRAVENOUS | Status: AC
  Administered 2023-11-30: 300 mg via INTRAVENOUS
  Filled 2023-11-30: qty 15

## 2023-11-30 MED ORDER — DEXAMETHASONE SOD PHOSPHATE PF 10 MG/ML IJ SOLN
10.0000 mg | Freq: Once | INTRAMUSCULAR | Status: AC
  Administered 2023-11-30: 10 mg via INTRAVENOUS

## 2023-11-30 MED ORDER — PALONOSETRON HCL INJECTION 0.25 MG/5ML
0.2500 mg | Freq: Once | INTRAVENOUS | Status: AC
  Administered 2023-11-30: 0.25 mg via INTRAVENOUS
  Filled 2023-11-30: qty 5

## 2023-11-30 MED ORDER — DEXTROSE 5 % IV SOLN
INTRAVENOUS | Status: DC
  Filled 2023-11-30: qty 250

## 2023-11-30 MED ORDER — APREPITANT 130 MG/18ML IV EMUL
130.0000 mg | Freq: Once | INTRAVENOUS | Status: AC
  Administered 2023-11-30: 130 mg via INTRAVENOUS
  Filled 2023-11-30: qty 18

## 2023-11-30 MED ORDER — FAMOTIDINE IN NACL 20-0.9 MG/50ML-% IV SOLN
20.0000 mg | Freq: Once | INTRAVENOUS | Status: AC
  Administered 2023-11-30: 20 mg via INTRAVENOUS

## 2023-11-30 MED ORDER — SODIUM CHLORIDE 0.9 % IV SOLN
4500.0000 mg | INTRAVENOUS | Status: DC
  Administered 2023-11-30: 4500 mg via INTRAVENOUS
  Filled 2023-11-30: qty 90

## 2023-11-30 MED ORDER — SODIUM CHLORIDE 0.9 % IV SOLN
400.0000 mg/m2 | Freq: Once | INTRAVENOUS | Status: AC
  Administered 2023-11-30: 760 mg via INTRAVENOUS
  Filled 2023-11-30: qty 25

## 2023-11-30 MED ORDER — FAMOTIDINE IN NACL 20-0.9 MG/50ML-% IV SOLN: 20.0000 mg | Freq: Once | INTRAVENOUS | Status: DC

## 2023-11-30 NOTE — Patient Instructions (Signed)

## 2023-11-30 NOTE — Progress Notes (Signed)
 At 1151, patient began complaining of abdominal cramping and nausea. Irinotecan  paused and message sent to Morna Husband, NP. Ordered to give IV Pepcid . Vital signs stable and no other symptoms at this time.   After Pepcid  infusion completion, patient stated, The nausea has improved but my stomach is still cramping. Per Morna Husband, NP give second dose of Atropine . Patient abdominal cramping relieved after Atropine  injection. Irinotecan  restarted at 1306. Patient tolerated the rest of the Irinotecan  infusion without any difficulties. Vital signs stable at discharge.

## 2023-11-30 NOTE — Progress Notes (Signed)
 burning sensation / acid reflux with taking Potassium.  Diarrhea.  Nausea on Monday nights she does chemo.  numbness and tingling to hands and feet.  Ct c/a/p done 11/17; would like to discuss and what it means moving forward.
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
## 2023-12-01 ENCOUNTER — Other Ambulatory Visit: Payer: Self-pay

## 2023-12-01 LAB — CANCER ANTIGEN 19-9: CA 19-9: 290 U/mL — ABNORMAL HIGH (ref 0–35)

## 2023-12-02 ENCOUNTER — Inpatient Hospital Stay

## 2023-12-02 ENCOUNTER — Other Ambulatory Visit: Payer: Self-pay

## 2023-12-02 VITALS — BP 149/94 | HR 105 | Temp 97.9°F | Resp 17

## 2023-12-02 DIAGNOSIS — Z5111 Encounter for antineoplastic chemotherapy: Secondary | ICD-10-CM | POA: Diagnosis not present

## 2023-12-02 DIAGNOSIS — C252 Malignant neoplasm of tail of pancreas: Secondary | ICD-10-CM

## 2023-12-02 MED ORDER — SODIUM CHLORIDE 0.9% FLUSH
10.0000 mL | INTRAVENOUS | Status: DC | PRN
  Administered 2023-12-02: 10 mL
  Filled 2023-12-02: qty 10

## 2023-12-02 MED ORDER — PEGFILGRASTIM-CBQV 6 MG/0.6ML ~~LOC~~ SOSY
6.0000 mg | PREFILLED_SYRINGE | Freq: Once | SUBCUTANEOUS | Status: AC
  Administered 2023-12-02: 6 mg via SUBCUTANEOUS
  Filled 2023-12-02: qty 0.6

## 2023-12-07 ENCOUNTER — Encounter: Payer: Self-pay | Admitting: Oncology

## 2023-12-14 ENCOUNTER — Encounter: Payer: Self-pay | Admitting: Oncology

## 2023-12-14 ENCOUNTER — Telehealth: Payer: Self-pay

## 2023-12-14 ENCOUNTER — Inpatient Hospital Stay

## 2023-12-14 ENCOUNTER — Encounter: Payer: Self-pay | Admitting: Internal Medicine

## 2023-12-14 ENCOUNTER — Inpatient Hospital Stay: Admitting: Oncology

## 2023-12-14 ENCOUNTER — Inpatient Hospital Stay: Attending: Oncology

## 2023-12-14 VITALS — BP 121/74 | HR 109 | Temp 96.9°F | Resp 19 | Ht 64.0 in | Wt 164.1 lb

## 2023-12-14 VITALS — BP 135/83 | HR 101 | Temp 96.0°F | Resp 18

## 2023-12-14 DIAGNOSIS — D701 Agranulocytosis secondary to cancer chemotherapy: Secondary | ICD-10-CM | POA: Insufficient documentation

## 2023-12-14 DIAGNOSIS — Z5111 Encounter for antineoplastic chemotherapy: Secondary | ICD-10-CM

## 2023-12-14 DIAGNOSIS — C252 Malignant neoplasm of tail of pancreas: Secondary | ICD-10-CM

## 2023-12-14 DIAGNOSIS — C787 Secondary malignant neoplasm of liver and intrahepatic bile duct: Secondary | ICD-10-CM | POA: Insufficient documentation

## 2023-12-14 DIAGNOSIS — D6959 Other secondary thrombocytopenia: Secondary | ICD-10-CM | POA: Insufficient documentation

## 2023-12-14 DIAGNOSIS — T451X5A Adverse effect of antineoplastic and immunosuppressive drugs, initial encounter: Secondary | ICD-10-CM | POA: Diagnosis not present

## 2023-12-14 LAB — CBC WITH DIFFERENTIAL (CANCER CENTER ONLY)
Abs Immature Granulocytes: 0.12 K/uL — ABNORMAL HIGH (ref 0.00–0.07)
Basophils Absolute: 0 K/uL (ref 0.0–0.1)
Basophils Relative: 0 %
Eosinophils Absolute: 0 K/uL (ref 0.0–0.5)
Eosinophils Relative: 0 %
HCT: 33.9 % — ABNORMAL LOW (ref 36.0–46.0)
Hemoglobin: 10.9 g/dL — ABNORMAL LOW (ref 12.0–15.0)
Immature Granulocytes: 1 %
Lymphocytes Relative: 14 %
Lymphs Abs: 1.6 K/uL (ref 0.7–4.0)
MCH: 29.5 pg (ref 26.0–34.0)
MCHC: 32.2 g/dL (ref 30.0–36.0)
MCV: 91.9 fL (ref 80.0–100.0)
Monocytes Absolute: 0.7 K/uL (ref 0.1–1.0)
Monocytes Relative: 6 %
Neutro Abs: 9.6 K/uL — ABNORMAL HIGH (ref 1.7–7.7)
Neutrophils Relative %: 79 %
Platelet Count: 115 K/uL — ABNORMAL LOW (ref 150–400)
RBC: 3.69 MIL/uL — ABNORMAL LOW (ref 3.87–5.11)
RDW: 15.8 % — ABNORMAL HIGH (ref 11.5–15.5)
WBC Count: 12.1 K/uL — ABNORMAL HIGH (ref 4.0–10.5)
nRBC: 0 % (ref 0.0–0.2)

## 2023-12-14 LAB — CMP (CANCER CENTER ONLY)
ALT: 19 U/L (ref 0–44)
AST: 24 U/L (ref 15–41)
Albumin: 4.1 g/dL (ref 3.5–5.0)
Alkaline Phosphatase: 167 U/L — ABNORMAL HIGH (ref 38–126)
Anion gap: 12 (ref 5–15)
BUN: 9 mg/dL (ref 8–23)
CO2: 24 mmol/L (ref 22–32)
Calcium: 8.8 mg/dL — ABNORMAL LOW (ref 8.9–10.3)
Chloride: 105 mmol/L (ref 98–111)
Creatinine: 0.74 mg/dL (ref 0.44–1.00)
GFR, Estimated: >60 mL/min (ref >60)
Glucose, Bld: 182 mg/dL — ABNORMAL HIGH (ref 70–99)
Potassium: 3.4 mmol/L — ABNORMAL LOW (ref 3.5–5.1)
Sodium: 141 mmol/L (ref 135–145)
Total Bilirubin: 0.4 mg/dL (ref 0.0–1.2)
Total Protein: 6.1 g/dL — ABNORMAL LOW (ref 6.5–8.1)

## 2023-12-14 MED ORDER — SODIUM CHLORIDE 0.9 % IV SOLN
150.0000 mg/m2 | Freq: Once | INTRAVENOUS | Status: AC
  Administered 2023-12-14: 300 mg via INTRAVENOUS
  Filled 2023-12-14: qty 15

## 2023-12-14 MED ORDER — DEXTROSE 5 % IV SOLN
INTRAVENOUS | Status: DC
  Filled 2023-12-14: qty 250

## 2023-12-14 MED ORDER — APREPITANT 130 MG/18ML IV EMUL
130.0000 mg | Freq: Once | INTRAVENOUS | Status: AC
  Administered 2023-12-14: 130 mg via INTRAVENOUS
  Filled 2023-12-14: qty 18

## 2023-12-14 MED ORDER — PALONOSETRON HCL INJECTION 0.25 MG/5ML
0.2500 mg | Freq: Once | INTRAVENOUS | Status: AC
  Administered 2023-12-14: 0.25 mg via INTRAVENOUS
  Filled 2023-12-14: qty 5

## 2023-12-14 MED ORDER — SODIUM CHLORIDE 0.9 % IV SOLN
4500.0000 mg | INTRAVENOUS | Status: DC
  Administered 2023-12-14: 4500 mg via INTRAVENOUS
  Filled 2023-12-14: qty 90

## 2023-12-14 MED ORDER — SODIUM CHLORIDE 0.9 % IV SOLN
400.0000 mg/m2 | Freq: Once | INTRAVENOUS | Status: AC
  Administered 2023-12-14: 760 mg via INTRAVENOUS
  Filled 2023-12-14: qty 25

## 2023-12-14 MED ORDER — DEXAMETHASONE SOD PHOSPHATE PF 10 MG/ML IJ SOLN
10.0000 mg | Freq: Once | INTRAMUSCULAR | Status: AC
  Administered 2023-12-14: 10 mg via INTRAVENOUS

## 2023-12-14 MED ORDER — ATROPINE SULFATE 1 MG/ML IV SOLN
0.5000 mg | Freq: Once | INTRAVENOUS | Status: AC | PRN
  Administered 2023-12-14: 0.5 mg via INTRAVENOUS
  Filled 2023-12-14: qty 1

## 2023-12-14 NOTE — Patient Instructions (Signed)

## 2023-12-14 NOTE — Telephone Encounter (Signed)
 Radiology notified to report addendum for comparison of 11/2023 CT to the outside 08/2023 CT.
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
## 2023-12-14 NOTE — Progress Notes (Signed)
 Patient would like to know her scan results & get insight on next steps.

## 2023-12-15 LAB — CANCER ANTIGEN 19-9: CA 19-9: 279 U/mL — ABNORMAL HIGH (ref 0–35)

## 2023-12-16 ENCOUNTER — Inpatient Hospital Stay

## 2023-12-16 ENCOUNTER — Other Ambulatory Visit: Payer: Self-pay

## 2023-12-16 VITALS — BP 135/78 | HR 102 | Temp 99.0°F | Resp 20

## 2023-12-16 DIAGNOSIS — Z5111 Encounter for antineoplastic chemotherapy: Secondary | ICD-10-CM | POA: Diagnosis not present

## 2023-12-16 DIAGNOSIS — C252 Malignant neoplasm of tail of pancreas: Secondary | ICD-10-CM

## 2023-12-16 MED ORDER — PEGFILGRASTIM-CBQV 6 MG/0.6ML ~~LOC~~ SOSY
6.0000 mg | PREFILLED_SYRINGE | Freq: Once | SUBCUTANEOUS | Status: AC
  Administered 2023-12-16: 6 mg via SUBCUTANEOUS
  Filled 2023-12-16: qty 0.6

## 2023-12-16 NOTE — Progress Notes (Signed)
 Nutrition Follow-up:  Patient with pancreatic cancer with liver metastases.  Patient receiving Folfiri, oxaliplatin  stopped after 12 cycles.    Met with patient after pump removal.  Reports that her appetite is about the same.  Her mother has been in the hospital recently and has been working in her mother's house which may account for few pounds weight loss.  Continues to drink Fairlife shakes.  Appetite decreases usually the week of treatment.  Reports the she feels better without the oxaliplatin .      Medications: reviewed  Labs: reviewed  Anthropometrics:   Weight 164 lb 1.6 oz 170 lb 11.2 oz on 11/10 172 lb on 10/13 171 lb 3.2 oz on 9/16 172 lb 12.8 oz on 9/2 173 lb 8 oz on 8/18 175 lb on 7/21 171 lb on 5/30   NUTRITION DIAGNOSIS: Inadequate oral intake stable   INTERVENTION:  Continue Fairlife shakes Continue foods rich in protein and calories to help maintain weight    MONITORING, EVALUATION, GOAL: weight trends, intake   NEXT VISIT: as needed  Kathleen Russell SOLON, CSO, LDN Registered Dietitian [phone]

## 2023-12-17 ENCOUNTER — Inpatient Hospital Stay

## 2023-12-17 ENCOUNTER — Telehealth: Payer: Self-pay

## 2023-12-17 NOTE — Telephone Encounter (Signed)
 Referral sent to Dr. Donato, [facility].

## 2023-12-17 NOTE — Telephone Encounter (Signed)
Printed and placed in yellow results folder.

## 2023-12-18 ENCOUNTER — Encounter: Payer: Self-pay | Admitting: Oncology

## 2023-12-21 ENCOUNTER — Encounter: Payer: Self-pay | Admitting: Internal Medicine

## 2023-12-21 NOTE — Assessment & Plan Note (Signed)
 BS from Nov 28 to Dec 11 reviewed.  Patient advised to increase your treatment day Lantus  dose from 35 units to 38 units,  and your dose on all other days from 10 units to 15 units   We can recheck  A1c after Jan 3.  Please schedule an lab visit and a virtual (or in office if you prefer) anytime after that.  If it's going to be a few weeks before we can have a visit,  send me a reminder to review your sugars.

## 2023-12-21 NOTE — Telephone Encounter (Signed)
 Tadvised to increase your treatment day Lantus  dose from 35 units to 38 units,  and your dose on all other days from 10 units to 15 units   We can recheck your A1c after Jan 3.  Please schedule an lab visit and a virtual (or in office if you prefer) anytime after that.  If it's going to be a few weeks before we can have a visit,  send me a reminder to review your sugars.

## 2023-12-22 ENCOUNTER — Inpatient Hospital Stay: Admitting: Nurse Practitioner

## 2023-12-22 ENCOUNTER — Encounter: Payer: Self-pay | Admitting: Nurse Practitioner

## 2023-12-22 ENCOUNTER — Telehealth: Payer: Self-pay | Admitting: *Deleted

## 2023-12-22 ENCOUNTER — Other Ambulatory Visit: Payer: Self-pay

## 2023-12-22 VITALS — BP 128/82 | HR 115 | Temp 98.3°F | Resp 16 | Wt 163.0 lb

## 2023-12-22 DIAGNOSIS — Z5111 Encounter for antineoplastic chemotherapy: Secondary | ICD-10-CM | POA: Diagnosis not present

## 2023-12-22 DIAGNOSIS — N898 Other specified noninflammatory disorders of vagina: Secondary | ICD-10-CM | POA: Diagnosis not present

## 2023-12-22 DIAGNOSIS — B3731 Acute candidiasis of vulva and vagina: Secondary | ICD-10-CM

## 2023-12-22 LAB — URINALYSIS, COMPLETE (UACMP) WITH MICROSCOPIC
Bilirubin Urine: NEGATIVE
Glucose, UA: >=500 mg/dL — AB
Ketones, ur: NEGATIVE mg/dL
Leukocytes,Ua: NEGATIVE
Nitrite: NEGATIVE
Protein, ur: NEGATIVE mg/dL
Specific Gravity, Urine: 1.014 (ref 1.005–1.030)
pH: 5 (ref 5.0–8.0)

## 2023-12-22 LAB — WET PREP, GENITAL
Clue Cells Wet Prep HPF POC: NONE SEEN
Sperm: NONE SEEN
Trich, Wet Prep: NONE SEEN
WBC, Wet Prep HPF POC: <10 (ref <10)

## 2023-12-22 MED ORDER — FLUCONAZOLE 150 MG PO TABS: ORAL_TABLET | ORAL | 0 refills | Status: DC

## 2023-12-22 NOTE — Telephone Encounter (Signed)
 Pt needs to schedule a lab appt and an office visit with Dr. [name] sometime after January 3rd.
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
## 2023-12-22 NOTE — Telephone Encounter (Signed)
 Patient called to request an evaluation for possible UTI vs yeast infection. She reports vaginal bleeding and noted perianal bleeding this morning. Patient currently uses estrodial vag cream for vaginal dryness. She has not yet applied her application this morning. Yesterday, she experienced multiple loose stools, now resolved,but feels her perianal area may be excoriated/chaffed. She is requesting to be seen in smc this afternoon for assessment of both vag/perianal areas to r/o yeast infection. She also wishes to r/o UTI giving the ongoing burning sensation on her urethra. She denies any fever, nausea vomiting or diarrhea at the present time. Patient being tx for pancreatic ca.  Pt is available around 230 today- if ok to add on.

## 2023-12-22 NOTE — Telephone Encounter (Signed)
 Spoke with Tinnie. Ok to add at 230 for smc.

## 2023-12-22 NOTE — Progress Notes (Signed)
 Patient complains of spotting and burning sensation w/without urination. Patient states she has had frequent urination along with loose stools.

## 2023-12-23 ENCOUNTER — Ambulatory Visit: Payer: Self-pay | Admitting: Nurse Practitioner

## 2023-12-23 ENCOUNTER — Encounter: Payer: Self-pay | Admitting: Oncology

## 2023-12-24 ENCOUNTER — Inpatient Hospital Stay

## 2023-12-24 LAB — URINE CULTURE

## 2024-01-02 ENCOUNTER — Other Ambulatory Visit: Payer: Self-pay

## 2024-01-02 ENCOUNTER — Emergency Department

## 2024-01-02 ENCOUNTER — Inpatient Hospital Stay
Admission: EM | Admit: 2024-01-02 | Discharge: 2024-01-04 | DRG: 176 | Disposition: A | Discharge status: Home / Self Care | Attending: Internal Medicine | Admitting: Internal Medicine

## 2024-01-02 DIAGNOSIS — I10 Essential (primary) hypertension: Secondary | ICD-10-CM | POA: Diagnosis present

## 2024-01-02 DIAGNOSIS — I82412 Acute embolism and thrombosis of left femoral vein: Secondary | ICD-10-CM | POA: Diagnosis present

## 2024-01-02 DIAGNOSIS — E876 Hypokalemia: Secondary | ICD-10-CM | POA: Diagnosis present

## 2024-01-02 DIAGNOSIS — Z794 Long term (current) use of insulin: Secondary | ICD-10-CM | POA: Diagnosis not present

## 2024-01-02 DIAGNOSIS — I82452 Acute embolism and thrombosis of left peroneal vein: Secondary | ICD-10-CM | POA: Diagnosis present

## 2024-01-02 DIAGNOSIS — E119 Type 2 diabetes mellitus without complications: Secondary | ICD-10-CM | POA: Diagnosis present

## 2024-01-02 DIAGNOSIS — D696 Thrombocytopenia, unspecified: Secondary | ICD-10-CM | POA: Diagnosis present

## 2024-01-02 DIAGNOSIS — Z9049 Acquired absence of other specified parts of digestive tract: Secondary | ICD-10-CM | POA: Diagnosis not present

## 2024-01-02 DIAGNOSIS — I824Y2 Acute embolism and thrombosis of unspecified deep veins of left proximal lower extremity: Secondary | ICD-10-CM

## 2024-01-02 DIAGNOSIS — Z813 Family history of other psychoactive substance abuse and dependence: Secondary | ICD-10-CM

## 2024-01-02 DIAGNOSIS — I82432 Acute embolism and thrombosis of left popliteal vein: Secondary | ICD-10-CM | POA: Diagnosis present

## 2024-01-02 DIAGNOSIS — Z96692 Finger-joint replacement of left hand: Secondary | ICD-10-CM | POA: Diagnosis present

## 2024-01-02 DIAGNOSIS — D6859 Other primary thrombophilia: Secondary | ICD-10-CM | POA: Diagnosis present

## 2024-01-02 DIAGNOSIS — Z833 Family history of diabetes mellitus: Secondary | ICD-10-CM

## 2024-01-02 DIAGNOSIS — D638 Anemia in other chronic diseases classified elsewhere: Secondary | ICD-10-CM | POA: Diagnosis present

## 2024-01-02 DIAGNOSIS — E669 Obesity, unspecified: Secondary | ICD-10-CM | POA: Diagnosis present

## 2024-01-02 DIAGNOSIS — I82442 Acute embolism and thrombosis of left tibial vein: Secondary | ICD-10-CM | POA: Diagnosis present

## 2024-01-02 DIAGNOSIS — Z85828 Personal history of other malignant neoplasm of skin: Secondary | ICD-10-CM

## 2024-01-02 DIAGNOSIS — Z8349 Family history of other endocrine, nutritional and metabolic diseases: Secondary | ICD-10-CM

## 2024-01-02 DIAGNOSIS — Z825 Family history of asthma and other chronic lower respiratory diseases: Secondary | ICD-10-CM

## 2024-01-02 DIAGNOSIS — Z811 Family history of alcohol abuse and dependence: Secondary | ICD-10-CM

## 2024-01-02 DIAGNOSIS — Z8249 Family history of ischemic heart disease and other diseases of the circulatory system: Secondary | ICD-10-CM

## 2024-01-02 DIAGNOSIS — Z79899 Other long term (current) drug therapy: Secondary | ICD-10-CM

## 2024-01-02 DIAGNOSIS — I2699 Other pulmonary embolism without acute cor pulmonale: Secondary | ICD-10-CM | POA: Diagnosis present

## 2024-01-02 DIAGNOSIS — K529 Noninfective gastroenteritis and colitis, unspecified: Secondary | ICD-10-CM | POA: Diagnosis present

## 2024-01-02 DIAGNOSIS — C252 Malignant neoplasm of tail of pancreas: Secondary | ICD-10-CM | POA: Diagnosis present

## 2024-01-02 DIAGNOSIS — Z8507 Personal history of malignant neoplasm of pancreas: Secondary | ICD-10-CM

## 2024-01-02 DIAGNOSIS — Z801 Family history of malignant neoplasm of trachea, bronchus and lung: Secondary | ICD-10-CM

## 2024-01-02 DIAGNOSIS — Z823 Family history of stroke: Secondary | ICD-10-CM

## 2024-01-02 DIAGNOSIS — Z6829 Body mass index (BMI) 29.0-29.9, adult: Secondary | ICD-10-CM

## 2024-01-02 DIAGNOSIS — Z803 Family history of malignant neoplasm of breast: Secondary | ICD-10-CM

## 2024-01-02 DIAGNOSIS — Z8261 Family history of arthritis: Secondary | ICD-10-CM

## 2024-01-02 DIAGNOSIS — Z8616 Personal history of COVID-19: Secondary | ICD-10-CM

## 2024-01-02 DIAGNOSIS — Z91040 Latex allergy status: Secondary | ICD-10-CM

## 2024-01-02 DIAGNOSIS — I2693 Single subsegmental pulmonary embolism without acute cor pulmonale: Secondary | ICD-10-CM | POA: Diagnosis not present

## 2024-01-02 DIAGNOSIS — I2609 Other pulmonary embolism with acute cor pulmonale: Secondary | ICD-10-CM | POA: Diagnosis not present

## 2024-01-02 DIAGNOSIS — I82402 Acute embolism and thrombosis of unspecified deep veins of left lower extremity: Secondary | ICD-10-CM | POA: Insufficient documentation

## 2024-01-02 DIAGNOSIS — E785 Hyperlipidemia, unspecified: Secondary | ICD-10-CM | POA: Diagnosis present

## 2024-01-02 DIAGNOSIS — I824Z2 Acute embolism and thrombosis of unspecified deep veins of left distal lower extremity: Secondary | ICD-10-CM | POA: Diagnosis not present

## 2024-01-02 LAB — CBC WITH DIFFERENTIAL/PLATELET
Abs Immature Granulocytes: 0.09 K/uL — ABNORMAL HIGH (ref 0.00–0.07)
Basophils Absolute: 0 K/uL (ref 0.0–0.1)
Basophils Relative: 0 %
Eosinophils Absolute: 0.2 K/uL (ref 0.0–0.5)
Eosinophils Relative: 2 %
HCT: 30.5 % — ABNORMAL LOW (ref 36.0–46.0)
Hemoglobin: 9.7 g/dL — ABNORMAL LOW (ref 12.0–15.0)
Immature Granulocytes: 1 %
Lymphocytes Relative: 14 %
Lymphs Abs: 1.8 K/uL (ref 0.7–4.0)
MCH: 29.7 pg (ref 26.0–34.0)
MCHC: 31.8 g/dL (ref 30.0–36.0)
MCV: 93.3 fL (ref 80.0–100.0)
Monocytes Absolute: 1 K/uL (ref 0.1–1.0)
Monocytes Relative: 8 %
Neutro Abs: 9.7 K/uL — ABNORMAL HIGH (ref 1.7–7.7)
Neutrophils Relative %: 75 %
Platelets: 112 K/uL — ABNORMAL LOW (ref 150–400)
RBC: 3.27 MIL/uL — ABNORMAL LOW (ref 3.87–5.11)
RDW: 14.3 % (ref 11.5–15.5)
WBC: 12.8 K/uL — ABNORMAL HIGH (ref 4.0–10.5)
nRBC: 0 % (ref 0.0–0.2)

## 2024-01-02 LAB — TROPONIN T, HIGH SENSITIVITY
Troponin T High Sensitivity: <15 ng/L (ref 0–19)
Troponin T High Sensitivity: <15 ng/L (ref 0–19)

## 2024-01-02 LAB — URINALYSIS, W/ REFLEX TO CULTURE (INFECTION SUSPECTED)
Bacteria, UA: NONE SEEN
Bilirubin Urine: NEGATIVE
Glucose, UA: 50 mg/dL — AB
Ketones, ur: NEGATIVE mg/dL
Leukocytes,Ua: NEGATIVE
Nitrite: NEGATIVE
Protein, ur: NEGATIVE mg/dL
Specific Gravity, Urine: 1.014 (ref 1.005–1.030)
WBC, UA: 0 WBC/hpf (ref 0–5)
pH: 7 (ref 5.0–8.0)

## 2024-01-02 LAB — PROTIME-INR
INR: 1 (ref 0.8–1.2)
Prothrombin Time: 13.5 s (ref 11.4–15.2)

## 2024-01-02 LAB — COMPREHENSIVE METABOLIC PANEL WITH GFR
ALT: 11 U/L (ref 0–44)
AST: 16 U/L (ref 15–41)
Albumin: 3.4 g/dL — ABNORMAL LOW (ref 3.5–5.0)
Alkaline Phosphatase: 103 U/L (ref 38–126)
Anion gap: 11 (ref 5–15)
BUN: 10 mg/dL (ref 8–23)
CO2: 25 mmol/L (ref 22–32)
Calcium: 8.6 mg/dL — ABNORMAL LOW (ref 8.9–10.3)
Chloride: 104 mmol/L (ref 98–111)
Creatinine, Ser: 0.46 mg/dL (ref 0.44–1.00)
GFR, Estimated: >60 mL/min
Glucose, Bld: 170 mg/dL — ABNORMAL HIGH (ref 70–99)
Potassium: 3.1 mmol/L — ABNORMAL LOW (ref 3.5–5.1)
Sodium: 139 mmol/L (ref 135–145)
Total Bilirubin: <0.2 mg/dL (ref 0.0–1.2)
Total Protein: 5.7 g/dL — ABNORMAL LOW (ref 6.5–8.1)

## 2024-01-02 LAB — GLUCOSE, CAPILLARY: Glucose-Capillary: 146 mg/dL — ABNORMAL HIGH (ref 70–99)

## 2024-01-02 LAB — APTT: aPTT: 25 s (ref 24–36)

## 2024-01-02 LAB — PRO BRAIN NATRIURETIC PEPTIDE: Pro Brain Natriuretic Peptide: 185 pg/mL

## 2024-01-02 LAB — LACTIC ACID, PLASMA
Lactic Acid, Venous: 1.3 mmol/L (ref 0.5–1.9)
Lactic Acid, Venous: 1.7 mmol/L (ref 0.5–1.9)

## 2024-01-02 LAB — MAGNESIUM: Magnesium: 1.7 mg/dL (ref 1.7–2.4)

## 2024-01-02 MED ORDER — ONDANSETRON HCL 4 MG/2ML IJ SOLN: 4.0000 mg | Freq: Four times a day (QID) | INTRAMUSCULAR | Status: DC | PRN

## 2024-01-02 MED ORDER — ONDANSETRON HCL 4 MG PO TABS: 4.0000 mg | ORAL_TABLET | Freq: Four times a day (QID) | ORAL | Status: DC | PRN

## 2024-01-02 MED ORDER — POTASSIUM CHLORIDE CRYS ER 20 MEQ PO TBCR
40.0000 meq | EXTENDED_RELEASE_TABLET | Freq: Two times a day (BID) | ORAL | Status: AC
  Administered 2024-01-02 – 2024-01-03 (×2): 40 meq via ORAL
  Filled 2024-01-02 (×2): qty 2

## 2024-01-02 MED ORDER — HEPARIN (PORCINE) 25000 UT/250ML-% IV SOLN
1200.0000 [IU]/h | INTRAVENOUS | Status: DC
  Administered 2024-01-02 – 2024-01-03 (×2): 1200 [IU]/h via INTRAVENOUS
  Filled 2024-01-02 (×2): qty 250

## 2024-01-02 MED ORDER — HEPARIN BOLUS VIA INFUSION
4900.0000 [IU] | Freq: Once | INTRAVENOUS | Status: AC
  Administered 2024-01-02: 4900 [IU] via INTRAVENOUS
  Filled 2024-01-02: qty 4900

## 2024-01-02 MED ORDER — INSULIN ASPART 100 UNIT/ML IJ SOLN
0.0000 [IU] | Freq: Three times a day (TID) | INTRAMUSCULAR | Status: DC
  Administered 2024-01-03: 2 [IU] via SUBCUTANEOUS
  Administered 2024-01-03 (×2): 3 [IU] via SUBCUTANEOUS
  Administered 2024-01-04: 2 [IU] via SUBCUTANEOUS
  Filled 2024-01-02: qty 3
  Filled 2024-01-02: qty 2
  Filled 2024-01-02: qty 3

## 2024-01-02 MED ORDER — OXYCODONE HCL 5 MG PO TABS: 5.0000 mg | ORAL_TABLET | ORAL | Status: DC | PRN

## 2024-01-02 MED ORDER — INSULIN ASPART 100 UNIT/ML IJ SOLN: 0.0000 [IU] | Freq: Every day | INTRAMUSCULAR | Status: DC

## 2024-01-02 MED ORDER — ACETAMINOPHEN 650 MG RE SUPP: 650.0000 mg | Freq: Four times a day (QID) | RECTAL | Status: DC | PRN

## 2024-01-02 MED ORDER — IOHEXOL 350 MG/ML SOLN
75.0000 mL | Freq: Once | INTRAVENOUS | Status: AC | PRN
  Administered 2024-01-02: 75 mL via INTRAVENOUS

## 2024-01-02 MED ORDER — ACETAMINOPHEN 325 MG PO TABS
650.0000 mg | ORAL_TABLET | Freq: Four times a day (QID) | ORAL | Status: DC | PRN
  Administered 2024-01-03: 650 mg via ORAL
  Filled 2024-01-02: qty 2
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
## 2024-01-02 NOTE — ED Notes (Signed)
 Attempted x2 for blood draw at this time.

## 2024-01-02 NOTE — Consult Note (Addendum)
 Pharmacy Consult Note - Anticoagulation  Pharmacy Consult for heparin  infusion Indication: pulmonary embolus and DVT Allergies[1]  PATIENT MEASUREMENTS: Height: 5' 4 (162.6 cm) Weight: 74.8 kg (165 lb) IBW/kg (Calculated) : 54.7 HEPARIN  DW (KG): 70.3  VITAL SIGNS: Temp: 98 F (36.7 C) (12/27 1702) Temp Source: Oral (12/27 1416) BP: 129/65 (12/27 1702) Pulse Rate: 102 (12/27 1702)  Recent Labs    01/02/24 1651  HGB 9.7*  HCT 30.5*  PLT 112*  APTT 25  LABPROT 13.5  INR 1.0  CREATININE 0.46    Estimated Creatinine Clearance: 65.7 mL/min (by C-G formula based on SCr of 0.46 mg/dL).  PAST MEDICAL HISTORY: Past Medical History:  Diagnosis Date   allergic rhinitis    Seem to be year-round and especially seasonal in Spring/Fall   Broken ankle    Cataract    Chicken pox    COVID-19 01/14/2020   GERD (gastroesophageal reflux disease)    History of broken nose    Hx: UTI (urinary tract infection)    Kidney infection    Left breast mass 02/24/2019   Menopause, premature    Pancreatic cancer (HCC)    Raynaud's phenomenon    Rhinitis, nonallergic    SIRS (systemic inflammatory response syndrome) (HCC) 06/18/2023   Skin cancer 02/18/2019   Dr. Arin Isenstein [facility], 2nd instance on 03/19/2022   Type 2 diabetes mellitus with obesity 06/10/2021    Medications:  (Not in a hospital admission)  Scheduled:   heparin   4,900 Units Intravenous Once   Infusions:   heparin      PRN:  Anti-infectives (From admission, onward)    None       ASSESSMENT: 68 y.o. female with PMH of pancreatic cancer is presenting with DVT/PE. 12/27 DVT US  showing extensive acute DVT to LLE in the common femoral, popliteal, posterior tibial and peroneal veins. 12/27 CT angio positive for PE in the right pulmonary artery and mild heart strain. Patient is not on chronic anticoagulation per chart review. Pharmacy has been consulted to initiate and manage heparin  intravenous  infusion.   Goal(s) of therapy: Heparin  level 0.3 - 0.7 units/mL aPTT 66 - 102 seconds Monitor platelets by anticoagulation protocol: Yes   Baseline anticoagulation labs: Recent Labs    01/02/24 1651  APTT 25  INR 1.0  HGB 9.7*  PLT 112*      PLAN:  Give 4,900 units bolus x1; then start heparin  infusion at 1200 units/hour.  Check heparin  level in 6 hours, then daily once at least two levels are consecutively therapeutic.  Monitor CBC daily while on heparin  infusion.  Annabella LOISE Banks, PharmD Clinical Pharmacist 01/02/2024 5:47 PM      [1]  Allergies Allergen Reactions   Latex Itching and Rash
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
## 2024-01-02 NOTE — ED Notes (Signed)
 Ultrasound at bedside

## 2024-01-02 NOTE — ED Triage Notes (Signed)
 Pt to ED for L calf swelling since 12/16. No pain to palpation. Wearing tights, unable to ascertain if redness. Pt has pancreatic cancer with metastasic to liver. Cancer center staff said calf swelling could be from mets to that area or fracture--pt has hx fracture to same ankle. States all of her family is sick right now and was on her feet a lot last week taking care of her mom. States during that time she stepped wrong off a curb and since then having pain to L heel and some L calf pain. Last chemo 12/8, has L chest port.

## 2024-01-03 ENCOUNTER — Inpatient Hospital Stay (HOSPITAL_COMMUNITY)
Admit: 2024-01-03 | Discharge: 2024-01-03 | Disposition: A | Discharge status: Home / Self Care | Attending: Internal Medicine | Admitting: Internal Medicine

## 2024-01-03 DIAGNOSIS — E876 Hypokalemia: Secondary | ICD-10-CM | POA: Insufficient documentation

## 2024-01-03 DIAGNOSIS — C252 Malignant neoplasm of tail of pancreas: Secondary | ICD-10-CM

## 2024-01-03 DIAGNOSIS — I824Z2 Acute embolism and thrombosis of unspecified deep veins of left distal lower extremity: Secondary | ICD-10-CM

## 2024-01-03 DIAGNOSIS — I2609 Other pulmonary embolism with acute cor pulmonale: Secondary | ICD-10-CM

## 2024-01-03 DIAGNOSIS — I2693 Single subsegmental pulmonary embolism without acute cor pulmonale: Secondary | ICD-10-CM | POA: Diagnosis not present

## 2024-01-03 DIAGNOSIS — I82402 Acute embolism and thrombosis of unspecified deep veins of left lower extremity: Secondary | ICD-10-CM | POA: Insufficient documentation

## 2024-01-03 LAB — ECHOCARDIOGRAM COMPLETE
AR max vel: 2.82 cm2
AV Area VTI: 3.07 cm2
AV Area mean vel: 2.81 cm2
AV Mean grad: 3.5 mmHg
AV Peak grad: 6 mmHg
Ao pk vel: 1.23 m/s
Calc EF: 71.1 %
Height: 64 in
MV VTI: 3.33 cm2
S' Lateral: 2.3 cm
Single Plane A2C EF: 63.2 %
Single Plane A4C EF: 78.9 %
Weight: 2719.59 [oz_av]

## 2024-01-03 LAB — BASIC METABOLIC PANEL WITH GFR
Anion gap: 10 (ref 5–15)
BUN: 6 mg/dL — ABNORMAL LOW (ref 8–23)
CO2: 26 mmol/L (ref 22–32)
Calcium: 8.4 mg/dL — ABNORMAL LOW (ref 8.9–10.3)
Chloride: 106 mmol/L (ref 98–111)
Creatinine, Ser: 0.51 mg/dL (ref 0.44–1.00)
GFR, Estimated: >60 mL/min
Glucose, Bld: 144 mg/dL — ABNORMAL HIGH (ref 70–99)
Potassium: 3.4 mmol/L — ABNORMAL LOW (ref 3.5–5.1)
Sodium: 141 mmol/L (ref 135–145)

## 2024-01-03 LAB — APTT: aPTT: 70 s — ABNORMAL HIGH (ref 24–36)

## 2024-01-03 LAB — HEPARIN LEVEL (UNFRACTIONATED)
Heparin Unfractionated: 0.32 [IU]/mL (ref 0.30–0.70)
Heparin Unfractionated: 0.36 [IU]/mL (ref 0.30–0.70)

## 2024-01-03 LAB — CBC
HCT: 30.7 % — ABNORMAL LOW (ref 36.0–46.0)
Hemoglobin: 9.8 g/dL — ABNORMAL LOW (ref 12.0–15.0)
MCH: 29.6 pg (ref 26.0–34.0)
MCHC: 31.9 g/dL (ref 30.0–36.0)
MCV: 92.7 fL (ref 80.0–100.0)
Platelets: 125 K/uL — ABNORMAL LOW (ref 150–400)
RBC: 3.31 MIL/uL — ABNORMAL LOW (ref 3.87–5.11)
RDW: 14.5 % (ref 11.5–15.5)
WBC: 8.8 K/uL (ref 4.0–10.5)
nRBC: 0 % (ref 0.0–0.2)

## 2024-01-03 LAB — GLUCOSE, CAPILLARY
Glucose-Capillary: 150 mg/dL — ABNORMAL HIGH (ref 70–99)
Glucose-Capillary: 182 mg/dL — ABNORMAL HIGH (ref 70–99)
Glucose-Capillary: 157 mg/dL — ABNORMAL HIGH (ref 70–99)
Glucose-Capillary: 117 mg/dL — ABNORMAL HIGH (ref 70–99)

## 2024-01-03 LAB — PROTIME-INR
INR: 1 (ref 0.8–1.2)
Prothrombin Time: 13.6 s (ref 11.4–15.2)

## 2024-01-03 MED ORDER — MAGNESIUM SULFATE 2 GM/50ML IV SOLN
2.0000 g | Freq: Once | INTRAVENOUS | Status: AC
  Administered 2024-01-03: 2 g via INTRAVENOUS
  Filled 2024-01-03: qty 50

## 2024-01-03 MED ORDER — PANCRELIPASE (LIP-PROT-AMYL) 12000-38000 UNITS PO CPEP
24000.0000 [IU] | ORAL_CAPSULE | Freq: Three times a day (TID) | ORAL | Status: DC
  Administered 2024-01-03 – 2024-01-04 (×3): 24000 [IU] via ORAL
  Filled 2024-01-03 (×2): qty 2

## 2024-01-03 NOTE — Progress Notes (Signed)
 [facility]  Daily Progress Note   Subjective  -   She feels her leg is better but still symptomatic with ambulation but not significantly to impair her.  Objective Vitals:   01/03/24 0343 01/03/24 0817 01/03/24 1142 01/03/24 1655  BP: (!) 114/55 123/81 125/77 112/69  Pulse: (!) 102 100 (!) 105 (!) 101  Resp: 18 16 20 16   Temp: 98.9 F (37.2 C) 99.1 F (37.3 C) 98.8 F (37.1 C) 98.4 F (36.9 C)  TempSrc:  Oral Oral Oral  SpO2: 96% 96% 97% 94%  Weight:      Height:        Intake/Output Summary (Last 24 hours) at 01/03/2024 1853 Last data filed at 01/03/2024 0600 Gross per 24 hour  Intake 182.12 ml  Output --  Net 182.12 ml    PULM  CTAB CV  RRR VASC  Minimal left leg edema  Laboratory CBC    Component Value Date/Time   WBC 8.8 01/03/2024 0527   HGB 9.8 (L) 01/03/2024 0527   HGB 10.9 (L) 12/14/2023 0836   HCT 30.7 (L) 01/03/2024 0527   PLT 125 (L) 01/03/2024 0527   PLT 115 (L) 12/14/2023 0836    BMET    Component Value Date/Time   NA 141 01/03/2024 0527   NA 138 10/09/2023 0913   K 3.4 (L) 01/03/2024 0527   CL 106 01/03/2024 0527   CO2 26 01/03/2024 0527   GLUCOSE 144 (H) 01/03/2024 0527   BUN 6 (L) 01/03/2024 0527   BUN 19 10/09/2023 0913   CREATININE 0.51 01/03/2024 0527   CREATININE 0.74 12/14/2023 0838   CREATININE 0.73 04/11/2022 1419   CALCIUM  8.4 (L) 01/03/2024 0527   GFRNONAA >60 01/03/2024 0527   GFRNONAA >60 12/14/2023 0838   GFRAA >60 06/25/2018 1939    Assessment/Planning: HD #2 s/p admisision for RLL PE and LLE extensive DVT  She is clinically doing well from the leg standpoint and can be anticoagulated.  She will call the office if her leg is significantly bothersome after she goes home.   Kathleen Russell  01/03/2024, 6:53 PM

## 2024-01-03 NOTE — Plan of Care (Signed)

## 2024-01-03 NOTE — Hospital Course (Signed)
 Kathleen Russell is a 68 y.o. female with medical history significant of metastatic pancreatic cancer, GERD, type 2 diabetes, hyperlipidemia, Raynaud's phenomena presented to emergency department with leg edema and pain since 6 to 7 days.  Duplex ultrasound showed extensive DVT in the left lower extremity, CT angiogram showed moderate amount of pulm emboli within the distal aspect of the right pulm artery. Patient was placed on heparin  infusion, vascular surgery consulted.

## 2024-01-03 NOTE — Consult Note (Signed)
 Pharmacy Consult Note - Anticoagulation  Pharmacy Consult for heparin  infusion Indication: pulmonary embolus and DVT Allergies[1]  PATIENT MEASUREMENTS: Height: 5' 4 (162.6 cm) Weight: 77.1 kg (169 lb 15.6 oz) IBW/kg (Calculated) : 54.7 HEPARIN  DW (KG): 70.3  VITAL SIGNS: Temp: 99.1 F (37.3 C) (12/28 0817) Temp Source: Oral (12/28 0817) BP: 123/81 (12/28 0817) Pulse Rate: 100 (12/28 0817)  Recent Labs    01/03/24 0527 01/03/24 0809  HGB 9.8*  --   HCT 30.7*  --   PLT 125*  --   APTT 70*  --   LABPROT 13.6  --   INR 1.0  --   HEPARINUNFRC  --  0.36  CREATININE 0.51  --     Estimated Creatinine Clearance: 66.7 mL/min (by C-G formula based on SCr of 0.51 mg/dL).  PAST MEDICAL HISTORY: Past Medical History:  Diagnosis Date   allergic rhinitis    Seem to be year-round and especially seasonal in Spring/Fall   Broken ankle    Cataract    Chicken pox    COVID-19 01/14/2020   GERD (gastroesophageal reflux disease)    History of broken nose    Hx: UTI (urinary tract infection)    Kidney infection    Left breast mass 02/24/2019   Menopause, premature    Pancreatic cancer (HCC)    Raynaud's phenomenon    Rhinitis, nonallergic    SIRS (systemic inflammatory response syndrome) (HCC) 06/18/2023   Skin cancer 02/18/2019   Dr. Arin Isenstein [facility], 2nd instance on 03/19/2022   Type 2 diabetes mellitus with obesity 06/10/2021    Medications:  Medications Prior to Admission  Medication Sig Dispense Refill Last Dose/Taking   acetaminophen  (TYLENOL ) 500 MG tablet Take 500 mg by mouth every 8 (eight) hours as needed.   Unknown   dexamethasone  (DECADRON ) 4 MG tablet TAKE 2 TABLET BY MOUTH EVERY DAY FOR 3 DAYS STARTING DAY AFTER CHEMOTHERPAY. TAKE WITH FOOD 30 tablet 1 12/17/2023   diphenoxylate -atropine  (LOMOTIL ) 2.5-0.025 MG tablet Take 1 tablet by mouth 4 (four) times daily as needed for diarrhea or loose stools. 30 tablet 2 Unknown   estradiol  (ESTRACE ) 0.1  MG/GM vaginal cream INSERT 1 APPLICATORFUL VAGINALLY 3 TIMES A WEEK 42.5 g 12 01/01/2024   fluticasone  (FLONASE ) 50 MCG/ACT nasal spray SHAKE LIQUID AND USE 2 SPRAYS IN EACH NOSTRIL AT BEDTIME AS NEEDED 16 g 5 Unknown   insulin  glargine (LANTUS  SOLOSTAR) 100 UNIT/ML Solostar Pen 30 units on steroid days,  10 units all other days (Patient taking differently: 38 Units daily. 38 units on steroid days,  15 units all other days) 15 mL PRN 01/02/2024   JANUVIA  25 MG tablet TAKE 1 TABLET(25 MG) BY MOUTH DAILY 90 tablet 1 01/02/2024 at  9:10 AM   levocetirizine (XYZAL ALLERGY 24HR) 5 MG tablet Take 5 mg by mouth every evening.   01/01/2024   loperamide  (IMODIUM ) 2 MG capsule Take 2 tabs by mouth with first loose stool, then 1 tab with each additional loose stool as needed. Do not exceed 8 tabs in a 24-hour period 60 capsule 3 Unknown   magic mouthwash (multi-ingredient) oral suspension Swish and swallow 5-10 mLs 4 (four) times daily. 480 mL 3 01/01/2024   ondansetron  (ZOFRAN ) 8 MG tablet Take 1 tablet (8 mg total) by mouth every 8 (eight) hours as needed for nausea or vomiting. Start on the third day after chemotherapy 30 tablet 1 Unknown   pantoprazole  (PROTONIX ) 20 MG tablet TAKE 1 TABLET(20 MG) BY MOUTH DAILY  90 tablet 0 01/01/2024   potassium chloride  (KLOR-CON  M) 10 MEQ tablet Take 1 tablet (10 mEq total) by mouth daily. 90 tablet 0 01/02/2024   rosuvastatin  (CRESTOR ) 20 MG tablet TAKE 1 TABLET(20 MG) BY MOUTH DAILY 90 tablet 1 01/01/2024   blood glucose meter kit and supplies Dispense based on patient and insurance preference. Use up to four times daily as directed. (FOR ICD-10 E10.9, E11.9). 1 each 0    Continuous Glucose Sensor (FREESTYLE LIBRE 3 PLUS SENSOR) MISC Change sensor every 15 days. 2 each 2    diclofenac Sodium (VOLTAREN) 1 % GEL Apply topically 4 (four) times daily. (Patient not taking: Reported on 01/02/2024)   Not Taking   fluconazole  (DIFLUCAN ) 150 MG tablet Take 1 tablet (150 mg) by  mouth on day 1. Three days later, repeat x 2 (Patient not taking: Reported on 01/02/2024) 3 tablet 0 Not Taking   Insulin  Pen Needle (PEN NEEDLES) 32G X 6 MM MISC Use to give insulin  100 each 2    lidocaine -prilocaine  (EMLA ) cream Apply to affected area once (Patient not taking: Reported on 01/02/2024) 30 g 3 Not Taking   metroNIDAZOLE  (METROGEL ) 1 % gel Apply topically daily. (Patient not taking: Reported on 01/02/2024) 45 g 0 Not Taking   prochlorperazine  (COMPAZINE ) 10 MG tablet Take 1 tablet (10 mg total) by mouth every 6 (six) hours as needed for nausea or vomiting (Nausea or vomiting). (Patient not taking: Reported on 01/02/2024) 30 tablet 1 Not Taking   Propylene Glycol (SYSTANE COMPLETE OP) Apply to eye. (Patient not taking: Reported on 01/02/2024)   Not Taking   triamcinolone  ointment (KENALOG ) 0.5 % Apply once a day as needed (Patient not taking: Reported on 01/02/2024) 30 g 0 Not Taking   VENTOLIN  HFA 108 (90 Base) MCG/ACT inhaler Inhale 2 puffs into the lungs every 6 (six) hours as needed. (Patient not taking: Reported on 01/02/2024) 1 each 1 Not Taking   Scheduled:   insulin  aspart  0-15 Units Subcutaneous TID WC   insulin  aspart  0-5 Units Subcutaneous QHS   lipase/protease/amylase  24,000 Units Oral TID AC   potassium chloride   40 mEq Oral BID   Infusions:   heparin  1,200 Units/hr (01/02/24 1850)   magnesium  sulfate bolus IVPB     PRN:  Anti-infectives (From admission, onward)    None       ASSESSMENT: 68 y.o. female with PMH of pancreatic cancer is presenting with DVT/PE. 12/27 DVT US  showing extensive acute DVT to LLE in the common femoral, popliteal, posterior tibial and peroneal veins. 12/27 CT angio positive for PE in the right pulmonary artery and mild heart strain. Patient is not on chronic anticoagulation per chart review. Pharmacy has been consulted to initiate and manage heparin  intravenous infusion.   Goal(s) of therapy: Heparin  level 0.3 - 0.7 units/mL aPTT  66 - 102 seconds Monitor platelets by anticoagulation protocol: Yes   Baseline anticoagulation labs: Recent Labs    01/02/24 1651 01/03/24 0527  APTT 25 70*  INR 1.0 1.0  HGB 9.7* 9.8*  PLT 112* 125*     12/28 0141 HL 0.32, therapeutic x 1 12/28 0809 HL 0.36, therapeutic x 2  PLAN: - Continue heparin  infusion at 1200 units/hour. - Recheck heparin  level tomorrow with AM labs - Monitor CBC daily while on heparin  infusion.  Waverly Tarquinio A Sandon Yoho, PharmD Clinical Pharmacist 01/03/2024 8:48 AM         [1]  Allergies Allergen Reactions   Latex Itching and Rash

## 2024-01-03 NOTE — Consult Note (Signed)
 Pharmacy Consult Note - Anticoagulation  Pharmacy Consult for heparin  infusion Indication: pulmonary embolus and DVT Allergies[1]  PATIENT MEASUREMENTS: Height: 5' 4 (162.6 cm) Weight: 77.1 kg (169 lb 15.6 oz) IBW/kg (Calculated) : 54.7 HEPARIN  DW (KG): 70.3  VITAL SIGNS: Temp: 98.5 F (36.9 C) (12/27 2337) BP: 107/57 (12/27 2337) Pulse Rate: 104 (12/27 2337)  Recent Labs    01/02/24 1651 01/03/24 0141  HGB 9.7*  --   HCT 30.5*  --   PLT 112*  --   APTT 25  --   LABPROT 13.5  --   INR 1.0  --   HEPARINUNFRC  --  0.32  CREATININE 0.46  --     Estimated Creatinine Clearance: 66.7 mL/min (by C-G formula based on SCr of 0.46 mg/dL).  PAST MEDICAL HISTORY: Past Medical History:  Diagnosis Date   allergic rhinitis    Seem to be year-round and especially seasonal in Spring/Fall   Broken ankle    Cataract    Chicken pox    COVID-19 01/14/2020   GERD (gastroesophageal reflux disease)    History of broken nose    Hx: UTI (urinary tract infection)    Kidney infection    Left breast mass 02/24/2019   Menopause, premature    Pancreatic cancer (HCC)    Raynaud's phenomenon    Rhinitis, nonallergic    SIRS (systemic inflammatory response syndrome) (HCC) 06/18/2023   Skin cancer 02/18/2019   Dr. Arin Isenstein [facility], 2nd instance on 03/19/2022   Type 2 diabetes mellitus with obesity 06/10/2021    Medications:  Medications Prior to Admission  Medication Sig Dispense Refill Last Dose/Taking   acetaminophen  (TYLENOL ) 500 MG tablet Take 500 mg by mouth every 8 (eight) hours as needed.   Unknown   dexamethasone  (DECADRON ) 4 MG tablet TAKE 2 TABLET BY MOUTH EVERY DAY FOR 3 DAYS STARTING DAY AFTER CHEMOTHERPAY. TAKE WITH FOOD 30 tablet 1 12/17/2023   diphenoxylate -atropine  (LOMOTIL ) 2.5-0.025 MG tablet Take 1 tablet by mouth 4 (four) times daily as needed for diarrhea or loose stools. 30 tablet 2 Unknown   estradiol  (ESTRACE ) 0.1 MG/GM vaginal cream INSERT 1  APPLICATORFUL VAGINALLY 3 TIMES A WEEK 42.5 g 12 01/01/2024   fluticasone  (FLONASE ) 50 MCG/ACT nasal spray SHAKE LIQUID AND USE 2 SPRAYS IN EACH NOSTRIL AT BEDTIME AS NEEDED 16 g 5 Unknown   insulin  glargine (LANTUS  SOLOSTAR) 100 UNIT/ML Solostar Pen 30 units on steroid days,  10 units all other days (Patient taking differently: 38 Units daily. 38 units on steroid days,  15 units all other days) 15 mL PRN 01/02/2024   JANUVIA  25 MG tablet TAKE 1 TABLET(25 MG) BY MOUTH DAILY 90 tablet 1 01/02/2024 at  9:10 AM   levocetirizine (XYZAL ALLERGY 24HR) 5 MG tablet Take 5 mg by mouth every evening.   01/01/2024   loperamide  (IMODIUM ) 2 MG capsule Take 2 tabs by mouth with first loose stool, then 1 tab with each additional loose stool as needed. Do not exceed 8 tabs in a 24-hour period 60 capsule 3 Unknown   magic mouthwash (multi-ingredient) oral suspension Swish and swallow 5-10 mLs 4 (four) times daily. 480 mL 3 01/01/2024   ondansetron  (ZOFRAN ) 8 MG tablet Take 1 tablet (8 mg total) by mouth every 8 (eight) hours as needed for nausea or vomiting. Start on the third day after chemotherapy 30 tablet 1 Unknown   pantoprazole  (PROTONIX ) 20 MG tablet TAKE 1 TABLET(20 MG) BY MOUTH DAILY 90 tablet 0 01/01/2024  potassium chloride  (KLOR-CON  M) 10 MEQ tablet Take 1 tablet (10 mEq total) by mouth daily. 90 tablet 0 01/02/2024   rosuvastatin  (CRESTOR ) 20 MG tablet TAKE 1 TABLET(20 MG) BY MOUTH DAILY 90 tablet 1 01/01/2024   blood glucose meter kit and supplies Dispense based on patient and insurance preference. Use up to four times daily as directed. (FOR ICD-10 E10.9, E11.9). 1 each 0    Continuous Glucose Sensor (FREESTYLE LIBRE 3 PLUS SENSOR) MISC Change sensor every 15 days. 2 each 2    diclofenac Sodium (VOLTAREN) 1 % GEL Apply topically 4 (four) times daily. (Patient not taking: Reported on 01/02/2024)   Not Taking   fluconazole  (DIFLUCAN ) 150 MG tablet Take 1 tablet (150 mg) by mouth on day 1. Three days  later, repeat x 2 (Patient not taking: Reported on 01/02/2024) 3 tablet 0 Not Taking   Insulin  Pen Needle (PEN NEEDLES) 32G X 6 MM MISC Use to give insulin  100 each 2    lidocaine -prilocaine  (EMLA ) cream Apply to affected area once (Patient not taking: Reported on 01/02/2024) 30 g 3 Not Taking   metroNIDAZOLE  (METROGEL ) 1 % gel Apply topically daily. (Patient not taking: Reported on 01/02/2024) 45 g 0 Not Taking   prochlorperazine  (COMPAZINE ) 10 MG tablet Take 1 tablet (10 mg total) by mouth every 6 (six) hours as needed for nausea or vomiting (Nausea or vomiting). (Patient not taking: Reported on 01/02/2024) 30 tablet 1 Not Taking   Propylene Glycol (SYSTANE COMPLETE OP) Apply to eye. (Patient not taking: Reported on 01/02/2024)   Not Taking   triamcinolone  ointment (KENALOG ) 0.5 % Apply once a day as needed (Patient not taking: Reported on 01/02/2024) 30 g 0 Not Taking   VENTOLIN  HFA 108 (90 Base) MCG/ACT inhaler Inhale 2 puffs into the lungs every 6 (six) hours as needed. (Patient not taking: Reported on 01/02/2024) 1 each 1 Not Taking   Scheduled:   insulin  aspart  0-15 Units Subcutaneous TID WC   insulin  aspart  0-5 Units Subcutaneous QHS   potassium chloride   40 mEq Oral BID   Infusions:   heparin  1,200 Units/hr (01/02/24 1850)   PRN:  Anti-infectives (From admission, onward)    None       ASSESSMENT: 68 y.o. female with PMH of pancreatic cancer is presenting with DVT/PE. 12/27 DVT US  showing extensive acute DVT to LLE in the common femoral, popliteal, posterior tibial and peroneal veins. 12/27 CT angio positive for PE in the right pulmonary artery and mild heart strain. Patient is not on chronic anticoagulation per chart review. Pharmacy has been consulted to initiate and manage heparin  intravenous infusion.   Goal(s) of therapy: Heparin  level 0.3 - 0.7 units/mL aPTT 66 - 102 seconds Monitor platelets by anticoagulation protocol: Yes   Baseline anticoagulation labs: Recent  Labs    01/02/24 1651  APTT 25  INR 1.0  HGB 9.7*  PLT 112*     12/28 0141 HL 0.32, therapeutic x 1  PLAN:  Continue heparin  infusion at 1200 units/hour.  Recheck heparin  level in 6 hours to confirm, then daily once at least two levels are consecutively therapeutic.  Monitor CBC daily while on heparin  infusion.  Rankin CANDIE Dills, PharmD, [facility] 01/03/2024 2:31 AM       [1]  Allergies Allergen Reactions   Latex Itching and Rash

## 2024-01-03 NOTE — Progress Notes (Signed)
" °  Progress Note   Patient: Kathleen Russell FMW:[id number] DOB: [birth year] DOA: 01/02/2024     1 DOS: the patient was seen and examined on 01/03/2024   Brief hospital course: Kathleen Russell is a 68 y.o. female with medical history significant of metastatic pancreatic cancer, GERD, type 2 diabetes, hyperlipidemia, Raynaud's phenomena presented to emergency department with leg edema and pain since 6 to 7 days.  Duplex ultrasound showed extensive DVT in the left lower extremity, CT angiogram showed moderate amount of pulm emboli within the distal aspect of the right pulm artery. Patient was placed on heparin  infusion, vascular surgery consulted.   Principal Problem:   Pulmonary embolism (HCC) Active Problems:   Malignant neoplasm of tail of pancreas (HCC)   Hypokalemia   Left leg DVT (HCC)   Assessment and Plan: Acute pulmonary emboli without acute cor pulmonale. Left lower extremity extensive DVT. Patient does not have acute respiratory failure, no heart strain.  But has extensive left lower extremity DVT, vascular surgery is monitor patient, continue heparin  drip for today.  If no intervention is needed, patient can be going home tomorrow. Due to hypercoagulable status from pancreatic cancer, patient will need lifetime anticoagulation.  Pancreatic cancer. Follow-up with oncology as outpatient.  Anemia of chronic disease. Mild thrombocytopenia. Continue to follow.  Hypokalemia Repleted, recheck level tomorrow.  Overweight with BMI 29.18. Diet and excise.    Subjective:  Patient doing well, has some chest pain with deep breath, no shortness of breath.  Physical Exam: Vitals:   01/02/24 2030 01/02/24 2337 01/03/24 0343 01/03/24 0817  BP:  (!) 107/57 (!) 114/55 123/81  Pulse:  (!) 104 (!) 102 100  Resp:  20 18 16   Temp:  98.5 F (36.9 C) 98.9 F (37.2 C) 99.1 F (37.3 C)  TempSrc:    Oral  SpO2:  100% 96% 96%  Weight: 77.1 kg     Height: 5' 4 (1.626 m)       General exam: Appears calm and comfortable  Respiratory system: Clear to auscultation. Respiratory effort normal. Cardiovascular system: S1 & S2 heard, RRR. No JVD, murmurs, rubs, gallops or clicks.  Gastrointestinal system: Abdomen is nondistended, soft and nontender. No organomegaly or masses felt. Normal bowel sounds heard. Central nervous system: Alert and oriented. No focal neurological deficits. Extremities: Mild left leg edema. Skin: No rashes, lesions or ulcers Psychiatry: Judgement and insight appear normal. Mood & affect appropriate.    Data Reviewed:  Reviewed CT scan results, ultrasound results and lab results.  Family Communication: None  Disposition: Status is: Inpatient Remains inpatient appropriate because: Severity of disease, IV treatment.     Time spent: 35 minutes  Author: Murvin Mana, MD 01/03/2024 11:37 AM  For on call review [email or web address].    "

## 2024-01-04 ENCOUNTER — Other Ambulatory Visit (HOSPITAL_COMMUNITY): Payer: Self-pay

## 2024-01-04 ENCOUNTER — Telehealth (HOSPITAL_COMMUNITY): Payer: Self-pay | Admitting: Pharmacy Technician

## 2024-01-04 ENCOUNTER — Other Ambulatory Visit: Payer: Self-pay

## 2024-01-04 ENCOUNTER — Inpatient Hospital Stay: Admitting: Oncology

## 2024-01-04 ENCOUNTER — Inpatient Hospital Stay

## 2024-01-04 DIAGNOSIS — C252 Malignant neoplasm of tail of pancreas: Secondary | ICD-10-CM | POA: Diagnosis not present

## 2024-01-04 DIAGNOSIS — I2693 Single subsegmental pulmonary embolism without acute cor pulmonale: Secondary | ICD-10-CM | POA: Diagnosis not present

## 2024-01-04 DIAGNOSIS — I824Z2 Acute embolism and thrombosis of unspecified deep veins of left distal lower extremity: Secondary | ICD-10-CM | POA: Diagnosis not present

## 2024-01-04 LAB — GLUCOSE, CAPILLARY: Glucose-Capillary: 140 mg/dL — ABNORMAL HIGH (ref 70–99)

## 2024-01-04 MED ORDER — APIXABAN 5 MG PO TABS: 5.0000 mg | ORAL_TABLET | Freq: Two times a day (BID) | ORAL | Status: DC

## 2024-01-04 MED ORDER — LANTUS SOLOSTAR 100 UNIT/ML ~~LOC~~ SOPN: 10.0000 [IU] | PEN_INJECTOR | Freq: Every day | SUBCUTANEOUS | Status: AC

## 2024-01-04 MED ORDER — APIXABAN 5 MG PO TABS
ORAL_TABLET | ORAL | 0 refills | Status: DC
  Filled 2024-01-04: qty 120, 53d supply, fill #0

## 2024-01-04 MED ORDER — APIXABAN 5 MG PO TABS
10.0000 mg | ORAL_TABLET | Freq: Two times a day (BID) | ORAL | Status: DC
  Administered 2024-01-04: 10 mg via ORAL
  Filled 2024-01-04: qty 2

## 2024-01-04 MED ORDER — HEPARIN SOD (PORK) LOCK FLUSH 100 UNIT/ML IV SOLN
500.0000 [IU] | Freq: Once | INTRAVENOUS | Status: DC
  Filled 2024-01-04: qty 5

## 2024-01-04 MED ORDER — PANCRELIPASE (LIP-PROT-AMYL) 24000-76000 UNITS PO CPEP
24000.0000 [IU] | ORAL_CAPSULE | Freq: Three times a day (TID) | ORAL | 0 refills | Status: AC
  Filled 2024-01-04 (×2): qty 300, 100d supply, fill #0

## 2024-01-04 MED ORDER — CHLORHEXIDINE GLUCONATE CLOTH 2 % EX PADS
6.0000 | MEDICATED_PAD | Freq: Every day | CUTANEOUS | Status: DC
  Administered 2024-01-04: 6 via TOPICAL

## 2024-01-04 NOTE — Progress Notes (Signed)
 PHARMACY - ANTICOAGULATION CONSULT NOTE  Pharmacy Consult for apixaban  Indication: pulmonary embolus and DVT  Allergies[1]  Patient Measurements: Height: 5' 4 (162.6 cm) Weight: 77.1 kg (169 lb 15.6 oz) IBW/kg (Calculated) : 54.7 HEPARIN  DW (KG): 71  Vital Signs: Temp: 98.2 F (36.8 C) (12/29 0717) Temp Source: Oral (12/29 0717) BP: 131/69 (12/29 0717) Pulse Rate: 99 (12/29 0717)  Labs: Recent Labs    01/02/24 1651 01/03/24 0141 01/03/24 0527 01/03/24 0809  HGB 9.7*  --  9.8*  --   HCT 30.5*  --  30.7*  --   PLT 112*  --  125*  --   APTT 25  --  70*  --   LABPROT 13.5  --  13.6  --   INR 1.0  --  1.0  --   HEPARINUNFRC  --  0.32  --  0.36  CREATININE 0.46  --  0.51  --     Estimated Creatinine Clearance: 66.7 mL/min (by C-G formula based on SCr of 0.51 mg/dL).   Medical History: Past Medical History:  Diagnosis Date   allergic rhinitis    Seem to be year-round and especially seasonal in Spring/Fall   Broken ankle    Cataract    Chicken pox    COVID-19 01/14/2020   GERD (gastroesophageal reflux disease)    History of broken nose    Hx: UTI (urinary tract infection)    Kidney infection    Left breast mass 02/24/2019   Menopause, premature    Pancreatic cancer (HCC)    Raynaud's phenomenon    Rhinitis, nonallergic    SIRS (systemic inflammatory response syndrome) (HCC) 06/18/2023   Skin cancer 02/18/2019   Dr. Arin Isenstein  [facility], 2nd instance on 03/19/2022   Type 2 diabetes mellitus with obesity 06/10/2021    Medications:  Medications Prior to Admission  Medication Sig Dispense Refill Last Dose/Taking   acetaminophen  (TYLENOL ) 500 MG tablet Take 500 mg by mouth every 8 (eight) hours as needed.   Unknown   dexamethasone  (DECADRON ) 4 MG tablet TAKE 2 TABLET BY MOUTH EVERY DAY FOR 3 DAYS STARTING DAY AFTER CHEMOTHERPAY. TAKE WITH FOOD 30 tablet 1 12/17/2023   diphenoxylate -atropine  (LOMOTIL ) 2.5-0.025 MG tablet Take 1 tablet by mouth 4  (four) times daily as needed for diarrhea or loose stools. 30 tablet 2 Unknown   estradiol  (ESTRACE ) 0.1 MG/GM vaginal cream INSERT 1 APPLICATORFUL VAGINALLY 3 TIMES A WEEK 42.5 g 12 01/01/2024   fluticasone  (FLONASE ) 50 MCG/ACT nasal spray SHAKE LIQUID AND USE 2 SPRAYS IN EACH NOSTRIL AT BEDTIME AS NEEDED 16 g 5 Unknown   insulin  glargine (LANTUS  SOLOSTAR) 100 UNIT/ML Solostar Pen 30 units on steroid days,  10 units all other days (Patient taking differently: 38 Units daily. 38 units on steroid days,  15 units all other days) 15 mL PRN 01/02/2024   JANUVIA  25 MG tablet TAKE 1 TABLET(25 MG) BY MOUTH DAILY 90 tablet 1 01/02/2024 at  9:10 AM   levocetirizine (XYZAL ALLERGY 24HR) 5 MG tablet Take 5 mg by mouth every evening.   01/01/2024   loperamide  (IMODIUM ) 2 MG capsule Take 2 tabs by mouth with first loose stool, then 1 tab with each additional loose stool as needed. Do not exceed 8 tabs in a 24-hour period 60 capsule 3 Unknown   magic mouthwash (multi-ingredient) oral suspension Swish and swallow 5-10 mLs 4 (four) times daily. 480 mL 3 01/01/2024   ondansetron  (ZOFRAN ) 8 MG tablet Take 1 tablet (8 mg total)  by mouth every 8 (eight) hours as needed for nausea or vomiting. Start on the third day after chemotherapy 30 tablet 1 Unknown   pantoprazole  (PROTONIX ) 20 MG tablet TAKE 1 TABLET(20 MG) BY MOUTH DAILY 90 tablet 0 01/01/2024   potassium chloride  (KLOR-CON  M) 10 MEQ tablet Take 1 tablet (10 mEq total) by mouth daily. 90 tablet 0 01/02/2024   rosuvastatin  (CRESTOR ) 20 MG tablet TAKE 1 TABLET(20 MG) BY MOUTH DAILY 90 tablet 1 01/01/2024   blood glucose meter kit and supplies Dispense based on patient and insurance preference. Use up to four times daily as directed. (FOR ICD-10 E10.9, E11.9). 1 each 0    Continuous Glucose Sensor (FREESTYLE LIBRE 3 PLUS SENSOR) MISC Change sensor every 15 days. 2 each 2    diclofenac Sodium (VOLTAREN) 1 % GEL Apply topically 4 (four) times daily. (Patient not taking:  Reported on 01/02/2024)   Not Taking   fluconazole  (DIFLUCAN ) 150 MG tablet Take 1 tablet (150 mg) by mouth on day 1. Three days later, repeat x 2 (Patient not taking: Reported on 01/02/2024) 3 tablet 0 Not Taking   Insulin  Pen Needle (PEN NEEDLES) 32G X 6 MM MISC Use to give insulin  100 each 2    lidocaine -prilocaine  (EMLA ) cream Apply to affected area once (Patient not taking: Reported on 01/02/2024) 30 g 3 Not Taking   metroNIDAZOLE  (METROGEL ) 1 % gel Apply topically daily. (Patient not taking: Reported on 01/02/2024) 45 g 0 Not Taking   prochlorperazine  (COMPAZINE ) 10 MG tablet Take 1 tablet (10 mg total) by mouth every 6 (six) hours as needed for nausea or vomiting (Nausea or vomiting). (Patient not taking: Reported on 01/02/2024) 30 tablet 1 Not Taking   Propylene Glycol (SYSTANE COMPLETE OP) Apply to eye. (Patient not taking: Reported on 01/02/2024)   Not Taking   triamcinolone  ointment (KENALOG ) 0.5 % Apply once a day as needed (Patient not taking: Reported on 01/02/2024) 30 g 0 Not Taking   VENTOLIN  HFA 108 (90 Base) MCG/ACT inhaler Inhale 2 puffs into the lungs every 6 (six) hours as needed. (Patient not taking: Reported on 01/02/2024) 1 each 1 Not Taking   Scheduled:   apixaban   10 mg Oral BID   Followed by   NOREEN ON 01/11/2024] apixaban   5 mg Oral BID   insulin  aspart  0-15 Units Subcutaneous TID WC   insulin  aspart  0-5 Units Subcutaneous QHS   lipase/protease/amylase  24,000 Units Oral TID AC   Infusions:   Assessment: 68 y.o. female with PMH of pancreatic cancer is presenting with DVT/PE. 12/27 DVT US  showing extensive acute DVT to LLE in the common femoral, popliteal, posterior tibial and peroneal veins. 12/27 CT angio positive for PE in the right pulmonary artery and mild heart strain. Patient is not on chronic anticoagulation per chart review. Pharmacy has been consulted to transition heparin  drip to apixaban  12/28:  Hgb 9.8  Plt 125  Scr 0.51   Goal of Therapy:  Monitor  platelets by anticoagulation protocol: Yes   Plan:  Will order apixaban  10mg  po bid x 7 days then 5 mg po bid for DVT/PE treatment dosing. Continue to follow CBC, Scr per protocol  Penny Arrambide PharmD Clinical Pharmacist 01/04/2024       [1]  Allergies Allergen Reactions   Latex Itching and Rash

## 2024-01-04 NOTE — Discharge Instructions (Signed)
 Information on my medicine - ELIQUIS (apixaban)  This medication education was reviewed with me or my healthcare representative as part of my discharge preparation.  The pharmacist that spoke with me during my hospital stay was:  Darsh Vandevoort A, RPH  Why was Eliquis prescribed for you? Eliquis was prescribed to treat blood clots that may have been found in the veins of your legs (deep vein thrombosis) or in your lungs (pulmonary embolism) and to reduce the risk of them occurring again.  What do You need to know about Eliquis ? The starting dose is 10 mg (two 5 mg tablets) taken TWICE daily for the FIRST SEVEN (7) DAYS, then on (enter date)  ***  the dose is reduced to ONE 5 mg tablet taken TWICE daily.  Eliquis may be taken with or without food.   Try to take the dose about the same time in the morning and in the evening. If you have difficulty swallowing the tablet whole please discuss with your pharmacist how to take the medication safely.  Take Eliquis exactly as prescribed and DO NOT stop taking Eliquis without talking to the doctor who prescribed the medication.  Stopping may increase your risk of developing a new blood clot.  Refill your prescription before you run out.  After discharge, you should have regular check-up appointments with your healthcare provider that is prescribing your Eliquis.    What do you do if you miss a dose? If a dose of ELIQUIS is not taken at the scheduled time, take it as soon as possible on the same day and twice-daily administration should be resumed. The dose should not be doubled to make up for a missed dose.  Important Safety Information A possible side effect of Eliquis is bleeding. You should call your healthcare provider right away if you experience any of the following: Bleeding from an injury or your nose that does not stop. Unusual colored urine (red or dark brown) or unusual colored stools (red or black). Unusual bruising for unknown  reasons. A serious fall or if you hit your head (even if there is no bleeding).  Some medicines may interact with Eliquis and might increase your risk of bleeding or clotting while on Eliquis. To help avoid this, consult your healthcare provider or pharmacist prior to using any new prescription or non-prescription medications, including herbals, vitamins, non-steroidal anti-inflammatory drugs (NSAIDs) and supplements.  This website has more information on Eliquis (apixaban): http://www.eliquis.com/eliquis/home

## 2024-01-04 NOTE — TOC CM/SW Note (Signed)
 Transition of Care [facility]) - Inpatient Brief Assessment   Patient Details  Name: JOELLE ROSWELL MRN: [id number] Date of Birth: 17-Feb-1955  Transition of Care [facility]) CM/SW Contact:    Nathanael CHRISTELLA Ring, RN Phone Number: 01/04/2024, 10:04 AM   Clinical Narrative:  Transition of Care [facility]) Screening Note   Patient Details  Name: CHARLESETTA MILLIRON Date of Birth: 02/26/1955   Transition of Care [facility]) CM/SW Contact:    Nathanael CHRISTELLA Ring, RN Phone Number: 01/04/2024, 10:04 AM    Transition of Care Department [facility]) has reviewed patient and no TOC needs have been identified at this time.  If new patient transition needs arise, please place a TOC consult.     Transition of Care Asessment: Insurance and Status: Insurance coverage has been reviewed Patient has primary care physician: Yes Home environment has been reviewed: from home with husband Prior level of function:: independent Prior/Current Home Services: No current home services Social Drivers of Health Review: SDOH reviewed no interventions necessary Readmission risk has been reviewed: Yes Transition of care needs: no transition of care needs at this time

## 2024-01-04 NOTE — Telephone Encounter (Signed)
 Patient Product/process Development Scientist completed.    The patient is insured through [employer]/ [employer]. Patient has Medicare and is not eligible for a copay card, but may be able to apply for patient assistance or Medicare RX Payment Plan (Patient Must reach out to their plan, if eligible for payment plan), if available.    Ran test claim for Eliquis  5 mg and the current 30 day co-pay is $0.00.   This test claim was processed through  [facility]- copay amounts may vary at other pharmacies due to pharmacy/plan contracts, or as the patient moves through the different stages of their insurance plan.     Reyes Sharps, CPHT Pharmacy Technician Patient Advocate Specialist Lead [facility] Patient Advocate Team Direct Number: [phone]  Fax: [phone]

## 2024-01-04 NOTE — Discharge Summary (Signed)
 " Physician Discharge Summary   Patient: Kathleen Russell MRN: [id number] DOB: [birth year]  Admit date:     01/02/2024  Discharge date: 01/04/2024  Discharge Physician: Murvin Mana   PCP: Marylynn Verneita CROME, MD   Recommendations at discharge:   Follow-up with PCP in 1 week. Follow-up with vascular surgery in 1 to 2 weeks. Follow-up with oncology as previous scheduled  Discharge Diagnoses: Principal Problem:   Pulmonary embolism (HCC) Active Problems:   Malignant neoplasm of tail of pancreas (HCC)   Hypokalemia   Left leg DVT (HCC)  Resolved Problems:   * No resolved hospital problems. *  Hospital Course: Kathleen Russell is a 68 y.o. female with medical history significant of metastatic pancreatic cancer, GERD, type 2 diabetes, hyperlipidemia, Raynaud's phenomena presented to emergency department with leg edema and pain since 6 to 7 days.  Duplex ultrasound showed extensive DVT in the left lower extremity, CT angiogram showed moderate amount of pulm emboli within the distal aspect of the right pulm artery. Patient was placed on heparin  infusion, vascular surgery consulted.  Deemed to not need surgery intervention.  Patient will transition to Eliquis , medically stable for discharge.  Assessment and Plan: Acute pulmonary emboli without acute cor pulmonale. Left lower extremity extensive DVT. Patient does not have acute respiratory failure, no heart strain.  But has extensive left lower extremity DVT, vascular surgery seen the patient, continued heparin  drip for today.  Due to hypercoagulable status from pancreatic cancer, patient will need lifetime anticoagulation. At this point, vascular surgery is not considering intervention.  Patient medically stable for discharge.   Pancreatic cancer. Chronic diarrhea secondary to pancreatic insufficiency. Follow-up with oncology as outpatient.   Anemia of chronic disease. Mild thrombocytopenia. Recheck a CBC at the next office visit.    Hypokalemia He received another dose of potassium yesterday for k of 3.4, continue oral supplement chronically.  Also received 2 g magnesium  sulfate for magnesium  1.7.   Overweight with BMI 29.18. Diet and excise.  Type 2 diabetes. Patient glucose not significant elevated decrease dose of Lantus .       Consultants: Vascular surgery. Procedures performed: None Disposition: Home Diet recommendation:  Discharge Diet Orders (From admission, onward)     Start     Ordered   01/04/24 0000  Diet - low sodium heart healthy        01/04/24 0955           Cardiac diet DISCHARGE MEDICATION: Allergies as of 01/04/2024       Reactions   Latex Itching, Rash        Medication List     STOP taking these medications    dexamethasone  4 MG tablet Commonly known as: DECADRON    estradiol  0.1 MG/GM vaginal cream Commonly known as: ESTRACE    fluconazole  150 MG tablet Commonly known as: Diflucan    lidocaine -prilocaine  cream Commonly known as: EMLA    metroNIDAZOLE  1 % gel Commonly known as: METROGEL    prochlorperazine  10 MG tablet Commonly known as: COMPAZINE    SYSTANE COMPLETE OP   triamcinolone  ointment 0.5 % Commonly known as: KENALOG    Ventolin  HFA 108 (90 Base) MCG/ACT inhaler Generic drug: albuterol    Voltaren 1 % Gel Generic drug: diclofenac Sodium       TAKE these medications    acetaminophen  500 MG tablet Commonly known as: TYLENOL  Take 500 mg by mouth every 8 (eight) hours as needed.   apixaban  5 MG Tabs tablet Commonly known as: ELIQUIS  Take 2 tablets (10 mg total)  by mouth 2 (two) times daily for 7 days, THEN 1 tablet (5 mg total) 2 (two) times daily. Start taking on: January 04, 2024   blood glucose meter kit and supplies Dispense based on patient and insurance preference. Use up to four times daily as directed. (FOR ICD-10 E10.9, E11.9).   diphenoxylate -atropine  2.5-0.025 MG tablet Commonly known as: LOMOTIL  Take 1 tablet by mouth 4  (four) times daily as needed for diarrhea or loose stools.   fluticasone  50 MCG/ACT nasal spray Commonly known as: FLONASE  SHAKE LIQUID AND USE 2 SPRAYS IN EACH NOSTRIL AT BEDTIME AS NEEDED   FreeStyle Libre 3 Plus Sensor Misc Change sensor every 15 days.   Januvia  25 MG tablet Generic drug: sitaGLIPtin  TAKE 1 TABLET(25 MG) BY MOUTH DAILY   Lantus  SoloStar 100 UNIT/ML Solostar Pen Generic drug: insulin  glargine Inject 10 Units into the skin daily. 30 units on steroid days,  10 units all other days What changed:  how much to take how to take this when to take this   loperamide  2 MG capsule Commonly known as: IMODIUM  Take 2 tabs by mouth with first loose stool, then 1 tab with each additional loose stool as needed. Do not exceed 8 tabs in a 24-hour period   magic mouthwash (multi-ingredient) oral suspension Swish and swallow 5-10 mLs 4 (four) times daily.   ondansetron  8 MG tablet Commonly known as: Zofran  Take 1 tablet (8 mg total) by mouth every 8 (eight) hours as needed for nausea or vomiting. Start on the third day after chemotherapy   Pancrelipase  (Lip-Prot-Amyl) 24000-76000 units Cpep Take 1 capsule (24,000 Units total) by mouth 3 (three) times daily before meals.   pantoprazole  20 MG tablet Commonly known as: PROTONIX  TAKE 1 TABLET(20 MG) BY MOUTH DAILY   Pen Needles 32G X 6 MM Misc Use to give insulin    potassium chloride  10 MEQ tablet Commonly known as: KLOR-CON  M Take 1 tablet (10 mEq total) by mouth daily.   rosuvastatin  20 MG tablet Commonly known as: CRESTOR  TAKE 1 TABLET(20 MG) BY MOUTH DAILY   Xyzal Allergy 24HR 5 MG tablet Generic drug: levocetirizine Take 5 mg by mouth every evening.        Follow-up Information     Marylynn Verneita CROME, MD Follow up in 1 week(s).   Specialty: Internal Medicine Contact information: [address] [place] [address] [phone]         [name], MD Follow up in 1 week(s).    Specialty: Vascular Surgery Contact information: [address] [place] [address] [phone]                Discharge Exam: Kathleen Russell   01/02/24 1419 01/02/24 2030  Weight: 74.8 kg 77.1 kg   General exam: Appears calm and comfortable  Respiratory system: Clear to auscultation. Respiratory effort normal. Cardiovascular system: S1 & S2 heard, RRR. No JVD, murmurs, rubs, gallops or clicks. No pedal edema. Gastrointestinal system: Abdomen is nondistended, soft and nontender. No organomegaly or masses felt. Normal bowel sounds heard. Central nervous system: Alert and oriented. No focal neurological deficits. Extremities: Symmetric 5 x 5 power. Skin: No rashes, lesions or ulcers Psychiatry: Judgement and insight appear normal. Mood & affect appropriate.    Condition at discharge: good  The results of significant diagnostics from this hospitalization (including imaging, microbiology, ancillary and laboratory) are listed below for reference.   Imaging Studies: ECHOCARDIOGRAM COMPLETE Result Date: 01/03/2024    ECHOCARDIOGRAM  REPORT   Patient Name:   Kathleen Russell Date of Exam: 01/03/2024 Medical Rec #:  [id number]        Height:       64.0 in Accession #:    [id number]       Weight:       170.0 lb Date of Birth:  06/16/1955         BSA:          1.826 m Patient Age:    68 years         BP:           114/55 mmHg Patient Gender: F                HR:           98 bpm. Exam Location:  [facility] Procedure: 2D Echo, Color Doppler and Cardiac Doppler (Both Spectral and Color            Flow Doppler were utilized during procedure). Indications:     I26.09 Pulmonary Embolus  History:         Patient has prior history of Echocardiogram examinations.                  Pancreatic CA; Risk Factors:Diabetes.  Sonographer:     L. Thornton-Maynard, RDCS Referring Phys:  [id number] VELNA SAUNDERS [facility] Diagnosing Phys: Redell Cave MD  Sonographer Comments: Global longitudinal strain  was attempted. IMPRESSIONS  1. Left ventricular ejection fraction, by estimation, is 60 to 65%. The left ventricle has normal function. The left ventricle has no regional wall motion abnormalities. Left ventricular diastolic parameters are consistent with Grade I diastolic dysfunction (impaired relaxation).  2. Right ventricular systolic function is normal. The right ventricular size is normal. There is normal pulmonary artery systolic pressure.  3. The mitral valve is normal in structure. Mild mitral valve regurgitation.  4. The aortic valve is tricuspid. Aortic valve regurgitation is not visualized.  5. The inferior vena cava is normal in size with greater than 50% respiratory variability, suggesting right atrial pressure of 3 mmHg. FINDINGS  Left Ventricle: Left ventricular ejection fraction, by estimation, is 60 to 65%. The left ventricle has normal function. The left ventricle has no regional wall motion abnormalities. Global longitudinal strain performed but not reported based on interpreter judgement due to suboptimal tracking. The left ventricular internal cavity size was normal in size. There is no left ventricular hypertrophy. Left ventricular diastolic parameters are consistent with Grade I diastolic dysfunction (impaired relaxation). Right Ventricle: The right ventricular size is normal. No increase in right ventricular wall thickness. Right ventricular systolic function is normal. There is normal pulmonary artery systolic pressure. The tricuspid regurgitant velocity is 2.28 m/s, and  with an assumed right atrial pressure of 3 mmHg, the estimated right ventricular systolic pressure is 23.8 mmHg. Left Atrium: Left atrial size was normal in size. Right Atrium: Right atrial size was normal in size. Pericardium: There is no evidence of pericardial effusion. Mitral Valve: The mitral valve is normal in structure. Mild mitral valve regurgitation. MV peak gradient, 6.0 mmHg. The mean mitral valve gradient is 3.0  mmHg. Tricuspid Valve: The tricuspid valve is normal in structure. Tricuspid valve regurgitation is not demonstrated. Aortic Valve: The aortic valve is tricuspid. Aortic valve regurgitation is not visualized. Aortic valve mean gradient measures 3.5 mmHg. Aortic valve peak gradient measures 6.0 mmHg. Aortic valve area, by VTI measures 3.07 cm. Pulmonic Valve: The pulmonic valve was normal in structure.  Pulmonic valve regurgitation is not visualized. Aorta: The aortic root and ascending aorta are structurally normal, with no evidence of dilitation. Venous: The inferior vena cava is normal in size with greater than 50% respiratory variability, suggesting right atrial pressure of 3 mmHg. IAS/Shunts: No atrial level shunt detected by color flow Doppler.  LEFT VENTRICLE PLAX 2D LVIDd:         3.70 cm     Diastology LVIDs:         2.30 cm     LV e' medial:    7.18 cm/s LV PW:         1.10 cm     LV E/e' medial:  11.0 LV IVS:        1.00 cm     LV e' lateral:   5.87 cm/s LVOT diam:     2.00 cm     LV E/e' lateral: 13.5 LV SV:         67 LV SV Index:   36 LVOT Area:     3.14 cm LV IVRT:       103 msec  LV Volumes (MOD) LV vol d, MOD A2C: 39.4 ml LV vol d, MOD A4C: 43.0 ml LV vol s, MOD A2C: 14.5 ml LV vol s, MOD A4C: 9.1 ml LV SV MOD A2C:     24.9 ml LV SV MOD A4C:     43.0 ml LV SV MOD BP:      29.0 ml RIGHT VENTRICLE RV Basal diam:  2.70 cm RV Mid diam:    2.00 cm RV S prime:     11.00 cm/s TAPSE (M-mode): 1.6 cm LEFT ATRIUM             Index        RIGHT ATRIUM           Index LA diam:        3.80 cm 2.08 cm/m   RA Area:     10.70 cm LA Vol (A2C):   34.9 ml 19.12 ml/m  RA Volume:   20.40 ml  11.17 ml/m LA Vol (A4C):   44.6 ml 24.43 ml/m LA Biplane Vol: 40.7 ml 22.29 ml/m  AORTIC VALVE                    PULMONIC VALVE AV Area (Vmax):    2.82 cm     PV Vmax:       0.85 m/s AV Area (Vmean):   2.81 cm     PV Peak grad:  2.9 mmHg AV Area (VTI):     3.07 cm AV Vmax:           122.50 cm/s AV Vmean:          85.750  cm/s AV VTI:            0.217 m AV Peak Grad:      6.0 mmHg AV Mean Grad:      3.5 mmHg LVOT Vmax:         110.00 cm/s LVOT Vmean:        76.700 cm/s LVOT VTI:          0.212 m LVOT/AV VTI ratio: 0.98  AORTA Ao Root diam: 3.60 cm Ao Asc diam:  3.40 cm MITRAL VALVE                TRICUSPID VALVE MV Area VTI:  3.33 cm      TR Peak grad:   20.8  mmHg MV Peak grad: 6.0 mmHg      TR Vmax:        228.00 cm/s MV Mean grad: 3.0 mmHg MV Vmax:      1.22 m/s      SHUNTS MV Vmean:     88.3 cm/s     Systemic VTI:  0.21 m MV E velocity: 79.10 cm/s   Systemic Diam: 2.00 cm MV A velocity: 107.00 cm/s MV E/A ratio:  0.74 Redell Cave MD Electronically signed by Redell Cave MD Signature Date/Time: 01/03/2024/1:26:46 PM    Final    CT HEAD WO CONTRAST ( ) Result Date: 01/02/2024 EXAM: CT HEAD WITHOUT CONTRAST 01/02/2024 05:17:18 PM TECHNIQUE: CT of the head was performed without the administration of intravenous contrast. Automated exposure control, iterative reconstruction, and/or weight based adjustment of the mA/kV was utilized to reduce the radiation dose to as low as reasonably achievable. COMPARISON: MRI 12/25/2020. CLINICAL HISTORY: Headache, new onset (Age >= 51y). FINDINGS: BRAIN AND VENTRICLES: No acute hemorrhage. No evidence of acute infarct. No hydrocephalus. No extra-axial collection. No mass effect or midline shift. ORBITS: No acute abnormality. SINUSES: No acute abnormality. SOFT TISSUES AND SKULL: No acute soft tissue abnormality. No skull fracture. IMPRESSION: 1. No acute intracranial abnormality. Electronically signed by: Donnice Mania MD 01/02/2024 06:00 PM EST RP Workstation: [id number]   CT Angio Chest PE W and/or Wo Contrast Result Date: 01/02/2024 CLINICAL DATA:  History of metastatic pancreatic cancer presenting with left calf pain and suspected pulmonary embolism. EXAM: CT ANGIOGRAPHY CHEST WITH CONTRAST TECHNIQUE: Multidetector CT imaging of the chest was performed using the standard  protocol during bolus administration of intravenous contrast. Multiplanar CT image reconstructions and MIPs were obtained to evaluate the vascular anatomy. RADIATION DOSE REDUCTION: This exam was performed according to the departmental dose-optimization program which includes automated exposure control, adjustment of the mA and/or kV according to patient size and/or use of iterative reconstruction technique. CONTRAST:  75mL OMNIPAQUE  IOHEXOL  350 MG/ML SOLN COMPARISON:  November 23, 2023 FINDINGS: Cardiovascular: There is stable left-sided venous Port-A-Cath positioning. Satisfactory opacification of the pulmonary arteries to the segmental level. A moderate amount of intraluminal low attenuation is seen within the distal aspect of the right pulmonary artery, with extension to involve its right middle lobe and right lower lobe branches. Normal heart size. Very mild right heart strain is noted (RV/LV ratio of 1.06). No pericardial effusion. Mediastinum/Nodes: No enlarged mediastinal, hilar, or axillary lymph nodes. Thyroid  gland, trachea, and esophagus demonstrate no significant findings. Lungs/Pleura: Mild atelectasis is seen within the posterior aspect of the bilateral lower lobes. No acute infiltrate, pleural effusion or pneumothorax is identified. Upper Abdomen: No acute abnormality. Musculoskeletal: No chest wall abnormality. No acute or significant osseous findings. Review of the MIP images confirms the above findings. IMPRESSION: 1. Moderate amount of pulmonary embolism within the distal aspect of the right pulmonary artery, with extension to involve its right middle lobe and right lower lobe branches. 2. Very mild right heart strain (RV/LV ratio of 1.06). 3. Mild bilateral lower lobe atelectasis. Electronically Signed   By: Suzen Dials M.D.   On: 01/02/2024 17:26   US  Venous Img Lower Unilateral Left Result Date: 01/02/2024 CLINICAL DATA:  LEFT calf swelling for 12 days EXAM: LEFT LOWER EXTREMITY  VENOUS DOPPLER ULTRASOUND TECHNIQUE: Gray-scale sonography with graded compression, as well as color Doppler and duplex ultrasound were performed to evaluate the lower extremity deep venous systems from the level of the common femoral vein and including the common femoral,  femoral, profunda femoral, popliteal and calf veins including the posterior tibial, peroneal and gastrocnemius veins when visible. The superficial great saphenous vein was also interrogated. Spectral Doppler was utilized to evaluate flow at rest and with distal augmentation maneuvers in the common femoral, femoral and popliteal veins. COMPARISON:  None available FINDINGS: Contralateral Common Femoral Vein: Respiratory phasicity is normal and symmetric with the symptomatic side. No evidence of thrombus. Normal compressibility. Common Femoral Vein: Intramural filling defect with decreased flow and compressibility of the common femoral vein is consistent with acute DVT. Saphenofemoral Junction: No evidence of thrombus. Normal compressibility and flow on color Doppler imaging. Profunda Femoral Vein: No evidence of thrombus. Normal compressibility and flow on color Doppler imaging. Femoral Vein: Intramural filling defect with decreased flow and compressibility of the LEFT femoral vein is consistent with acute DVT. Popliteal Vein: Intramural filling defect with decreased flow and compressibility of the LEFT popliteal vein is consistent with acute DVT. Calf Veins: Intramural filling defect with decreased flow and compressibility of the posterior tibial and peroneal veins is consistent with acute DVT. Superficial Great Saphenous Vein: No evidence of thrombus. Normal compressibility. Other Findings:  None. IMPRESSION: Extensive acute DVT of the LEFT lower extremity involving the common femoral, femoral, popliteal, posterior tibial and peroneal veins. Electronically Signed   By: Aliene Lloyd M.D.   On: 01/02/2024 15:49   DG Chest 2 View Result Date:  01/02/2024 CLINICAL DATA:  Tachycardia EXAM: CHEST - 2 VIEW COMPARISON:  06/17/2023 FINDINGS: Tip of the left chest port overlies the SVC. The cardiomediastinal contours are normal. The lungs are clear. Pulmonary vasculature is normal. No consolidation, pleural effusion, or pneumothorax. No acute osseous abnormalities are seen. IMPRESSION: No active cardiopulmonary disease. Electronically Signed   By: Andrea Gasman M.D.   On: 01/02/2024 15:39    Microbiology: Results for orders placed or performed in visit on 12/22/23  Urine culture     Status: Abnormal   Collection Time: 12/22/23  3:15 PM   Specimen: Urine, Clean Catch  Result Value Ref Range Status   Specimen Description   Final    URINE, CLEAN CATCH Performed at [facility], [address] [place], [place] [address]    Special Requests   Final    NONE Performed at [facility], [address] [place], [place] [address]    Culture MULTIPLE SPECIES PRESENT, SUGGEST RECOLLECTION (A)  Final   Report Status 12/24/2023 FINAL  Final    Labs: CBC: Recent Labs  Lab 01/02/24 1651 01/03/24 0527  WBC 12.8* 8.8  NEUTROABS 9.7*  --   HGB 9.7* 9.8*  HCT 30.5* 30.7*  MCV 93.3 92.7  PLT 112* 125*   Basic Metabolic Panel: Recent Labs  Lab 01/02/24 1651 01/02/24 1751 01/03/24 0527  NA 139  --  141  K 3.1*  --  3.4*  CL 104  --  106  CO2 25  --  26  GLUCOSE 170*  --  144*  BUN 10  --  6*  CREATININE 0.46  --  0.51  CALCIUM  8.6*  --  8.4*  MG  --  1.7  --    Liver Function Tests: Recent Labs  Lab 01/02/24 1651  AST 16  ALT 11  ALKPHOS 103  BILITOT <0.2  PROT 5.7*  ALBUMIN 3.4*   CBG: Recent Labs  Lab 01/03/24 0818 01/03/24 1141 01/03/24 1655 01/03/24 2252 01/04/24 0715  GLUCAP 150* 182* 157* 117* 140*    Discharge time spent: 35 minutes.  Signed: Murvin Mana, MD [facility] 01/04/2024 "

## 2024-01-05 ENCOUNTER — Other Ambulatory Visit: Payer: Self-pay | Admitting: Internal Medicine

## 2024-01-05 ENCOUNTER — Telehealth: Payer: Self-pay | Admitting: *Deleted

## 2024-01-05 ENCOUNTER — Encounter: Payer: Self-pay | Admitting: Oncology

## 2024-01-05 NOTE — Transitions of Care (Post Inpatient/ED Visit) (Signed)
 "  01/05/2024  Name: Kathleen Russell MRN: [id number] DOB: [birth year]  Today's TOC FU Call Status: Today's TOC FU Call Status:: Successful TOC FU Call Completed TOC FU Call Complete Date: 01/05/24  Patient's Name and Date of Birth confirmed. Name, DOB  Transition Care Management Follow-up Telephone Call Date of Discharge: 01/04/24 Discharge Facility: [facility]) Type of Discharge: Inpatient Admission Primary Inpatient Discharge Diagnosis:: Pulmonary embolism How have you been since you were released from the hospital?: Better Any questions or concerns?: No  Items Reviewed: Did you receive and understand the discharge instructions provided?: Yes Any new allergies since your discharge?: No Dietary orders reviewed?: No Do you have support at home?: Yes People in Home [RPT]: spouse Name of Support/Comfort Primary Source: [place]  Medications Reviewed Today: Medications Reviewed Today     Reviewed by Kennieth Cathlean DEL, RN (Case Manager) on 01/05/24 at 1123  Med List Status: <None>   Medication Order Taking? Sig Documenting Provider Last Dose Status Informant  acetaminophen  (TYLENOL ) 500 MG tablet [id number] Yes Take 500 mg by mouth every 8 (eight) hours as needed. [provider]  Active Self  apixaban  (ELIQUIS ) 5 MG TABS tablet [id number] Yes Take 2 tablets (10 mg total) by mouth 2 (two) times daily for 7 days, THEN 1 tablet (5 mg total) 2 (two) times daily. Laurita Pillion, MD  Active   blood glucose meter kit and supplies [id number] Yes Dispense based on patient and insurance preference. Use up to four times daily as directed. (FOR ICD-10 E10.9, E11.9). Marylynn Verneita CROME, MD  Active Self  Continuous Glucose Sensor (FREESTYLE LIBRE 3 PLUS SENSOR) [place] [id number] Yes Change sensor every 15 days. Marylynn Verneita CROME, MD  Active Self  dextrose  5 % solution [id number]   Rao, Archana C, MD  Active   diphenoxylate -atropine  (LOMOTIL ) 2.5-0.025 MG tablet [id number]  Yes Take 1 tablet by mouth 4 (four) times daily as needed for diarrhea or loose stools. Melanee Annah BROCKS, MD  Active Self  fluticasone  (FLONASE ) 50 MCG/ACT nasal spray [id number] Yes SHAKE LIQUID AND USE 2 SPRAYS IN EACH NOSTRIL AT BEDTIME AS NEEDED Webb, Padonda B, FNP  Active Self           Med Note [name]   Sat Jan 02, 2024  6:41 PM) Uses every night  insulin  glargine (LANTUS  SOLOSTAR) 100 UNIT/ML Solostar Pen [id number] Yes Inject 10 Units into the skin daily. 30 units on steroid days,  10 units all other days Laurita Pillion, MD  Active   Insulin  Pen Needle (PEN NEEDLES) 32G X 6 MM MISC [id number] Yes Use to give insulin  [name], [name], MD  Active Self  JANUVIA  25 MG tablet [id number] Yes TAKE 1 TABLET(25 MG) BY MOUTH DAILY Marylynn Verneita CROME, MD  Active Self  levocetirizine (XYZAL ALLERGY 24HR) 5 MG tablet [id number] Yes Take 5 mg by mouth every evening. [provider]  Active Self  loperamide  (IMODIUM ) 2 MG capsule [id number] Yes Take 2 tabs by mouth with first loose stool, then 1 tab with each additional loose stool as needed. Do not exceed 8 tabs in a 24-hour period Melanee Annah BROCKS, MD  Active Self  magic mouthwash (multi-ingredient) oral suspension [id number] Yes Swish and swallow 5-10 mLs 4 (four) times daily. Melanee Annah BROCKS, MD  Active Self  ondansetron  (ZOFRAN ) 8 MG tablet [id number] Yes Take 1 tablet (8 mg total) by mouth every 8 (eight) hours as needed for nausea or vomiting. Start on the third day  after chemotherapy Rao, Archana C, MD  Active Self  Pancrelipase , Lip-Prot-Amyl, 24000-76000 units CPEP [id number] Yes Take 1 capsule (24,000 Units total) by mouth 3 (three) times daily before meals. Laurita Pillion, MD  Active   pantoprazole  (PROTONIX ) 20 MG tablet [id number] Yes TAKE 1 TABLET(20 MG) BY MOUTH DAILY Rao, Archana C, MD  Active Self  potassium chloride  (KLOR-CON  M) 10 MEQ tablet [id number] Yes Take 1 tablet (10 mEq total) by mouth daily. Melanee Annah BROCKS, MD  Active Self   rosuvastatin  (CRESTOR ) 20 MG tablet [id number] Yes TAKE 1 TABLET(20 MG) BY MOUTH DAILY Marylynn Verneita CROME, MD  Active Self            Home Care and Equipment/Supplies: Were Home Health Services Ordered?: NA Any new equipment or medical supplies ordered?: NA  Functional Questionnaire: Do you need assistance with bathing/showering or dressing?: No Do you need assistance with meal preparation?: Yes Do you need assistance with eating?: No Do you have difficulty maintaining continence: No Do you need assistance with getting out of bed/getting out of a chair/moving?: No Do you have difficulty managing or taking your medications?: No  Follow up appointments reviewed: PCP Follow-up appointment confirmed?: No MD Provider Line [phone] Given: Yes (Patient is calling for follow up appointment. She has an appointment and calling to see if it can be changed to follow up) Specialist Hospital Follow-up appointment confirmed?: No Reason Specialist Follow-Up Not Confirmed: Patient has Specialist Provider Number and will Call for Appointment (Patient is calling specialist to schedule an appointment) Do you need transportation to your follow-up appointment?: No Do you understand care options if your condition(s) worsen?: Yes-patient verbalized understanding  SDOH Interventions Today    Flowsheet Row Most Recent Value  SDOH Interventions   Food Insecurity Interventions Intervention Not Indicated  Housing Interventions Intervention Not Indicated  Transportation Interventions Intervention Not Indicated, Patient Resources (Friends/Family)  Utilities Interventions Intervention Not Indicated  Discussed and offered 30 day TOC program.  Patient   declined.  The patient has been provided with contact information for the care management team and has been advised to call with any health -related questions or concerns.  The patient verbalized understanding with current plan of care.  The patient is  directed to their insurance card regarding availability of benefits coverage    Cathlean Headland BSN RN [facility] Care Management Coordinator [email or web address] Direct Dial: [phone]  Fax: [phone] Website: [email or web address]  "

## 2024-01-06 ENCOUNTER — Encounter: Payer: Self-pay | Admitting: Oncology

## 2024-01-06 ENCOUNTER — Other Ambulatory Visit: Payer: Self-pay | Admitting: Oncology

## 2024-01-06 ENCOUNTER — Inpatient Hospital Stay

## 2024-01-06 DIAGNOSIS — C252 Malignant neoplasm of tail of pancreas: Secondary | ICD-10-CM

## 2024-01-08 ENCOUNTER — Other Ambulatory Visit: Payer: Self-pay | Admitting: Oncology

## 2024-01-08 NOTE — Telephone Encounter (Signed)
 I have changed her rx to 1/12. She can keep her appt with dr donato on 1/9

## 2024-01-09 ENCOUNTER — Emergency Department

## 2024-01-09 ENCOUNTER — Other Ambulatory Visit: Payer: Self-pay

## 2024-01-09 ENCOUNTER — Inpatient Hospital Stay
Admission: EM | Admit: 2024-01-09 | Discharge: 2024-01-12 | DRG: 872 | Disposition: A | Discharge status: Home / Self Care | Attending: Internal Medicine | Admitting: Internal Medicine

## 2024-01-09 DIAGNOSIS — T451X5A Adverse effect of antineoplastic and immunosuppressive drugs, initial encounter: Secondary | ICD-10-CM | POA: Diagnosis present

## 2024-01-09 DIAGNOSIS — Z801 Family history of malignant neoplasm of trachea, bronchus and lung: Secondary | ICD-10-CM

## 2024-01-09 DIAGNOSIS — Z803 Family history of malignant neoplasm of breast: Secondary | ICD-10-CM

## 2024-01-09 DIAGNOSIS — Z91040 Latex allergy status: Secondary | ICD-10-CM

## 2024-01-09 DIAGNOSIS — Z8261 Family history of arthritis: Secondary | ICD-10-CM

## 2024-01-09 DIAGNOSIS — Z794 Long term (current) use of insulin: Secondary | ICD-10-CM

## 2024-01-09 DIAGNOSIS — Z86718 Personal history of other venous thrombosis and embolism: Secondary | ICD-10-CM

## 2024-01-09 DIAGNOSIS — Z83438 Family history of other disorder of lipoprotein metabolism and other lipidemia: Secondary | ICD-10-CM

## 2024-01-09 DIAGNOSIS — Z86711 Personal history of pulmonary embolism: Secondary | ICD-10-CM

## 2024-01-09 DIAGNOSIS — R509 Fever, unspecified: Principal | ICD-10-CM

## 2024-01-09 DIAGNOSIS — N390 Urinary tract infection, site not specified: Principal | ICD-10-CM | POA: Diagnosis present

## 2024-01-09 DIAGNOSIS — Z813 Family history of other psychoactive substance abuse and dependence: Secondary | ICD-10-CM

## 2024-01-09 DIAGNOSIS — K521 Toxic gastroenteritis and colitis: Secondary | ICD-10-CM | POA: Diagnosis present

## 2024-01-09 DIAGNOSIS — Z87440 Personal history of urinary (tract) infections: Secondary | ICD-10-CM

## 2024-01-09 DIAGNOSIS — I7300 Raynaud's syndrome without gangrene: Secondary | ICD-10-CM | POA: Diagnosis present

## 2024-01-09 DIAGNOSIS — Z7984 Long term (current) use of oral hypoglycemic drugs: Secondary | ICD-10-CM

## 2024-01-09 DIAGNOSIS — R6510 Systemic inflammatory response syndrome (SIRS) of non-infectious origin without acute organ dysfunction: Secondary | ICD-10-CM | POA: Diagnosis present

## 2024-01-09 DIAGNOSIS — Z8614 Personal history of Methicillin resistant Staphylococcus aureus infection: Secondary | ICD-10-CM

## 2024-01-09 DIAGNOSIS — Z811 Family history of alcohol abuse and dependence: Secondary | ICD-10-CM

## 2024-01-09 DIAGNOSIS — Z79899 Other long term (current) drug therapy: Secondary | ICD-10-CM

## 2024-01-09 DIAGNOSIS — Z1152 Encounter for screening for COVID-19: Secondary | ICD-10-CM

## 2024-01-09 DIAGNOSIS — A419 Sepsis, unspecified organism: Principal | ICD-10-CM | POA: Diagnosis present

## 2024-01-09 DIAGNOSIS — K529 Noninfective gastroenteritis and colitis, unspecified: Secondary | ICD-10-CM | POA: Diagnosis present

## 2024-01-09 DIAGNOSIS — Z85828 Personal history of other malignant neoplasm of skin: Secondary | ICD-10-CM

## 2024-01-09 DIAGNOSIS — Z8616 Personal history of COVID-19: Secondary | ICD-10-CM

## 2024-01-09 DIAGNOSIS — D849 Immunodeficiency, unspecified: Secondary | ICD-10-CM | POA: Diagnosis present

## 2024-01-09 DIAGNOSIS — C252 Malignant neoplasm of tail of pancreas: Secondary | ICD-10-CM | POA: Diagnosis present

## 2024-01-09 DIAGNOSIS — Z809 Family history of malignant neoplasm, unspecified: Secondary | ICD-10-CM

## 2024-01-09 DIAGNOSIS — Z8249 Family history of ischemic heart disease and other diseases of the circulatory system: Secondary | ICD-10-CM

## 2024-01-09 DIAGNOSIS — Z7901 Long term (current) use of anticoagulants: Secondary | ICD-10-CM

## 2024-01-09 DIAGNOSIS — Z825 Family history of asthma and other chronic lower respiratory diseases: Secondary | ICD-10-CM

## 2024-01-09 DIAGNOSIS — Z823 Family history of stroke: Secondary | ICD-10-CM

## 2024-01-09 DIAGNOSIS — Z833 Family history of diabetes mellitus: Secondary | ICD-10-CM

## 2024-01-09 DIAGNOSIS — E119 Type 2 diabetes mellitus without complications: Secondary | ICD-10-CM | POA: Diagnosis present

## 2024-01-09 LAB — COMPREHENSIVE METABOLIC PANEL WITH GFR
ALT: 24 U/L (ref 0–44)
AST: 48 U/L — ABNORMAL HIGH (ref 15–41)
Albumin: 3.6 g/dL (ref 3.5–5.0)
Alkaline Phosphatase: 115 U/L (ref 38–126)
Anion gap: 13 (ref 5–15)
BUN: 13 mg/dL (ref 8–23)
CO2: 23 mmol/L (ref 22–32)
Calcium: 8.7 mg/dL — ABNORMAL LOW (ref 8.9–10.3)
Chloride: 98 mmol/L (ref 98–111)
Creatinine, Ser: 0.55 mg/dL (ref 0.44–1.00)
GFR, Estimated: >60 mL/min
Glucose, Bld: 162 mg/dL — ABNORMAL HIGH (ref 70–99)
Potassium: 5 mmol/L (ref 3.5–5.1)
Sodium: 134 mmol/L — ABNORMAL LOW (ref 135–145)
Total Bilirubin: 0.4 mg/dL (ref 0.0–1.2)
Total Protein: 7 g/dL (ref 6.5–8.1)

## 2024-01-09 LAB — CBC WITH DIFFERENTIAL/PLATELET
Abs Immature Granulocytes: 0.1 K/uL — ABNORMAL HIGH (ref 0.00–0.07)
Basophils Absolute: 0.1 K/uL (ref 0.0–0.1)
Basophils Relative: 0 %
Eosinophils Absolute: 0.3 K/uL (ref 0.0–0.5)
Eosinophils Relative: 2 %
HCT: 32 % — ABNORMAL LOW (ref 36.0–46.0)
Hemoglobin: 9.8 g/dL — ABNORMAL LOW (ref 12.0–15.0)
Immature Granulocytes: 1 %
Lymphocytes Relative: 12 %
Lymphs Abs: 1.6 K/uL (ref 0.7–4.0)
MCH: 28.3 pg (ref 26.0–34.0)
MCHC: 30.6 g/dL (ref 30.0–36.0)
MCV: 92.5 fL (ref 80.0–100.0)
Monocytes Absolute: 1.3 K/uL — ABNORMAL HIGH (ref 0.1–1.0)
Monocytes Relative: 9 %
Neutro Abs: 10.6 K/uL — ABNORMAL HIGH (ref 1.7–7.7)
Neutrophils Relative %: 76 %
Platelets: 183 K/uL (ref 150–400)
RBC: 3.46 MIL/uL — ABNORMAL LOW (ref 3.87–5.11)
RDW: 14.2 % (ref 11.5–15.5)
WBC: 14 K/uL — ABNORMAL HIGH (ref 4.0–10.5)
nRBC: 0 % (ref 0.0–0.2)

## 2024-01-09 LAB — RESP PANEL BY RT-PCR (RSV, FLU A&B, COVID)  RVPGX2
Influenza A by PCR: NEGATIVE
Influenza B by PCR: NEGATIVE
Resp Syncytial Virus by PCR: NEGATIVE
SARS Coronavirus 2 by RT PCR: NEGATIVE

## 2024-01-09 LAB — PROTIME-INR
INR: 1.4 — ABNORMAL HIGH (ref 0.8–1.2)
Prothrombin Time: 17.8 s — ABNORMAL HIGH (ref 11.4–15.2)

## 2024-01-09 LAB — LACTIC ACID, PLASMA: Lactic Acid, Venous: 1 mmol/L (ref 0.5–1.9)

## 2024-01-09 MED ORDER — ACETAMINOPHEN 325 MG PO TABS
975.0000 mg | ORAL_TABLET | Freq: Once | ORAL | Status: AC
  Administered 2024-01-09: 975 mg via ORAL
  Filled 2024-01-09: qty 3

## 2024-01-09 MED ORDER — SODIUM CHLORIDE 0.9 % IV BOLUS
1000.0000 mL | Freq: Once | INTRAVENOUS | Status: AC
  Administered 2024-01-09: 1000 mL via INTRAVENOUS

## 2024-01-09 NOTE — ED Triage Notes (Signed)
 Pt comes in POV for fever of 101. Pt has hx of stage 4  pancreatic cancer with mets to liver. Pt was dc'd from here on Monday for DVT and Pe's, Pt is on Eliquis .  Pt's only symptom is sneezing.. Pt was recently treated for a yeast infection, last dose of flucanizole was 12/24. Pt reports no sick contacts.

## 2024-01-09 NOTE — ED Notes (Signed)
 Answered call light,pt ask for warm blanket, provided

## 2024-01-09 NOTE — ED Provider Notes (Signed)
 "  [facility] Provider Note    Event Date/Time   First MD Initiated Contact with Patient 01/09/24 2104     (approximate)   History   Fever   HPI  Kathleen Russell is a 69 y.o. female history of pancreatic cancer and recent admission for PE presenting today with concern of fever.  Since discharge, feeling increased fatigue weakness cough, has been trying to take some Tylenol  and has been noticing some intermittent fevers at home but primarily in the 99-100 range, 1 night it went up to 101, which she states that she was propped up in a blanket, this evening it was 100.5 causing her to become concerned and presented to the emergency department.  She complains of rhinorrhea and cough no other symptoms and denies any known sick contacts.  I reviewed her recent admission note where she was started on Eliquis .  Her last chemotherapy was in early December and she is scheduled to get another course of chemotherapy on 8 January.  States that shortness of breath and leg swelling have been improving no other complaints at this time.      Physical Exam   Triage Vital Signs: ED Triage Vitals  Encounter Vitals Group     BP 01/09/24 2030 (!) 136/99     Girls Systolic BP Percentile --      Girls Diastolic BP Percentile --      Boys Systolic BP Percentile --      Boys Diastolic BP Percentile --      Pulse Rate 01/09/24 2030 (!) 131     Resp 01/09/24 2030 17     Temp 01/09/24 2030 (!) 100.7 F (38.2 C)     Temp src --      SpO2 01/09/24 2030 98 %     Weight 01/09/24 2030 162 lb (73.5 kg)     Height 01/09/24 2030 5' 4 (1.626 m)     Head Circumference --      Peak Flow --      Pain Score 01/09/24 2051 4     Pain Loc --      Pain Education --      Exclude from Growth Chart --     Most recent vital signs: Vitals:   01/09/24 2030  BP: (!) 136/99  Pulse: (!) 131  Resp: 17  Temp: (!) 100.7 F (38.2 C)  SpO2: 98%     General: Awake, no distress.  CV:  Good  peripheral perfusion.  Resp:  Normal effort.  Abd:  No distention.  Soft nontender Other:     ED Results / Procedures / Treatments   Labs (all labs ordered are listed, but only abnormal results are displayed) Labs Reviewed  COMPREHENSIVE METABOLIC PANEL WITH GFR - Abnormal; Notable for the following components:      Result Value   Sodium 134 (*)    Glucose, Bld 162 (*)    Calcium  8.7 (*)    AST 48 (*)    All other components within normal limits  CBC WITH DIFFERENTIAL/PLATELET - Abnormal; Notable for the following components:   WBC 14.0 (*)    RBC 3.46 (*)    Hemoglobin 9.8 (*)    HCT 32.0 (*)    Neutro Abs 10.6 (*)    Monocytes Absolute 1.3 (*)    Abs Immature Granulocytes 0.10 (*)    All other components within normal limits  PROTIME-INR - Abnormal; Notable for the following components:   Prothrombin Time  17.8 (*)    INR 1.4 (*)    All other components within normal limits  RESP PANEL BY RT-PCR (RSV, FLU A&B, COVID)  [id number]  CULTURE, BLOOD (ROUTINE X 2)  CULTURE, BLOOD (ROUTINE X 2)  LACTIC ACID, PLASMA  URINALYSIS, W/ REFLEX TO CULTURE (INFECTION SUSPECTED)  LACTIC ACID, PLASMA     EKG     RADIOLOGY  No acute cardiopulmonary findings on my independent interpretation  PROCEDURES:  Critical Care performed: Yes, see critical care procedure note(s)  .Critical Care  Performed by: Fernand Rossie HERO, MD Authorized by: Fernand Rossie HERO, MD   Critical care provider statement:    Critical care time (minutes):  30   Critical care was time spent personally by me on the following activities:  Development of treatment plan with patient or surrogate, discussions with consultants, evaluation of patient's response to treatment, examination of patient, ordering and review of laboratory studies, ordering and review of radiographic studies, ordering and performing treatments and interventions, pulse oximetry, re-evaluation of patient's condition and review of old  charts    MEDICATIONS ORDERED IN ED: Medications  acetaminophen  (TYLENOL ) tablet 975 mg (975 mg Oral Given 01/09/24 2201)  sodium chloride  0.9 % bolus 1,000 mL (0 mLs Intravenous Stopped 01/09/24 2331)     IMPRESSION / MDM / ASSESSMENT AND PLAN / ED COURSE  I reviewed the triage vital signs and the nursing notes.                               Patient's presentation is most consistent with acute presentation with potential threat to life or bodily function.  70 year old female presenting today with concern of a fever, she is tachycardic here upon arrival, remaining vitals fortunately look reassuring.  Just recently discharged from our facility with PE, given her presentation possibly URI versus pneumonia resulting in sepsis.  Will follow-up labs imaging at time symptomatic management and determine disposition accordingly.  Signed out to Dr. Ernest awaiting UA results, patient viral panel testing without explanation for fever or symptoms.     FINAL CLINICAL IMPRESSION(S) / ED DIAGNOSES   Final diagnoses:  Fever, unspecified fever cause     Rx / DC Orders   ED Discharge Orders     None        Note:  This document was prepared using Dragon voice recognition software and may include unintentional dictation errors.   Fernand Rossie HERO, MD 01/09/24 2333  "

## 2024-01-10 DIAGNOSIS — R6510 Systemic inflammatory response syndrome (SIRS) of non-infectious origin without acute organ dysfunction: Secondary | ICD-10-CM | POA: Diagnosis present

## 2024-01-10 DIAGNOSIS — Z86711 Personal history of pulmonary embolism: Secondary | ICD-10-CM | POA: Diagnosis not present

## 2024-01-10 DIAGNOSIS — I7300 Raynaud's syndrome without gangrene: Secondary | ICD-10-CM | POA: Diagnosis present

## 2024-01-10 DIAGNOSIS — Z8614 Personal history of Methicillin resistant Staphylococcus aureus infection: Secondary | ICD-10-CM | POA: Diagnosis not present

## 2024-01-10 DIAGNOSIS — Z8616 Personal history of COVID-19: Secondary | ICD-10-CM | POA: Diagnosis not present

## 2024-01-10 DIAGNOSIS — Z85828 Personal history of other malignant neoplasm of skin: Secondary | ICD-10-CM | POA: Diagnosis not present

## 2024-01-10 DIAGNOSIS — Z86718 Personal history of other venous thrombosis and embolism: Secondary | ICD-10-CM | POA: Diagnosis not present

## 2024-01-10 DIAGNOSIS — Z8249 Family history of ischemic heart disease and other diseases of the circulatory system: Secondary | ICD-10-CM | POA: Diagnosis not present

## 2024-01-10 DIAGNOSIS — T451X5A Adverse effect of antineoplastic and immunosuppressive drugs, initial encounter: Secondary | ICD-10-CM | POA: Diagnosis present

## 2024-01-10 DIAGNOSIS — Z83438 Family history of other disorder of lipoprotein metabolism and other lipidemia: Secondary | ICD-10-CM | POA: Diagnosis not present

## 2024-01-10 DIAGNOSIS — Z7901 Long term (current) use of anticoagulants: Secondary | ICD-10-CM | POA: Diagnosis not present

## 2024-01-10 DIAGNOSIS — Z1152 Encounter for screening for COVID-19: Secondary | ICD-10-CM | POA: Diagnosis not present

## 2024-01-10 DIAGNOSIS — D849 Immunodeficiency, unspecified: Secondary | ICD-10-CM | POA: Diagnosis present

## 2024-01-10 DIAGNOSIS — Z7984 Long term (current) use of oral hypoglycemic drugs: Secondary | ICD-10-CM | POA: Diagnosis not present

## 2024-01-10 DIAGNOSIS — Z91040 Latex allergy status: Secondary | ICD-10-CM | POA: Diagnosis not present

## 2024-01-10 DIAGNOSIS — N390 Urinary tract infection, site not specified: Secondary | ICD-10-CM | POA: Diagnosis present

## 2024-01-10 DIAGNOSIS — Z79899 Other long term (current) drug therapy: Secondary | ICD-10-CM | POA: Diagnosis not present

## 2024-01-10 DIAGNOSIS — Z8261 Family history of arthritis: Secondary | ICD-10-CM | POA: Diagnosis not present

## 2024-01-10 DIAGNOSIS — Z833 Family history of diabetes mellitus: Secondary | ICD-10-CM | POA: Diagnosis not present

## 2024-01-10 DIAGNOSIS — E119 Type 2 diabetes mellitus without complications: Secondary | ICD-10-CM | POA: Diagnosis present

## 2024-01-10 DIAGNOSIS — K521 Toxic gastroenteritis and colitis: Secondary | ICD-10-CM | POA: Diagnosis present

## 2024-01-10 DIAGNOSIS — Z794 Long term (current) use of insulin: Secondary | ICD-10-CM | POA: Diagnosis not present

## 2024-01-10 DIAGNOSIS — A419 Sepsis, unspecified organism: Secondary | ICD-10-CM | POA: Diagnosis present

## 2024-01-10 DIAGNOSIS — C252 Malignant neoplasm of tail of pancreas: Secondary | ICD-10-CM | POA: Diagnosis present

## 2024-01-10 LAB — RESPIRATORY PANEL BY PCR

## 2024-01-10 LAB — URINALYSIS, W/ REFLEX TO CULTURE (INFECTION SUSPECTED)
Bacteria, UA: NONE SEEN
Bilirubin Urine: NEGATIVE
Glucose, UA: NEGATIVE mg/dL
Ketones, ur: NEGATIVE mg/dL
Leukocytes,Ua: NEGATIVE
Nitrite: NEGATIVE
Protein, ur: NEGATIVE mg/dL
Specific Gravity, Urine: 1.017 (ref 1.005–1.030)
pH: 6 (ref 5.0–8.0)

## 2024-01-10 LAB — CBG MONITORING, ED
Glucose-Capillary: 128 mg/dL — ABNORMAL HIGH (ref 70–99)
Glucose-Capillary: 210 mg/dL — ABNORMAL HIGH (ref 70–99)
Glucose-Capillary: 111 mg/dL — ABNORMAL HIGH (ref 70–99)

## 2024-01-10 LAB — GLUCOSE, CAPILLARY
Glucose-Capillary: 150 mg/dL — ABNORMAL HIGH (ref 70–99)
Glucose-Capillary: 150 mg/dL — ABNORMAL HIGH (ref 70–99)

## 2024-01-10 LAB — CORTISOL-AM, BLOOD: Cortisol - AM: 14.4 ug/dL (ref 6.7–22.6)

## 2024-01-10 MED ORDER — METRONIDAZOLE 500 MG/100ML IV SOLN
500.0000 mg | Freq: Two times a day (BID) | INTRAVENOUS | Status: DC
  Administered 2024-01-10 – 2024-01-12 (×3): 500 mg via INTRAVENOUS
  Filled 2024-01-10 (×5): qty 100

## 2024-01-10 MED ORDER — METRONIDAZOLE 500 MG/100ML IV SOLN
500.0000 mg | Freq: Once | INTRAVENOUS | Status: AC
  Administered 2024-01-10: 500 mg via INTRAVENOUS
  Filled 2024-01-10: qty 100

## 2024-01-10 MED ORDER — VANCOMYCIN HCL 1500 MG/300ML IV SOLN
1500.0000 mg | INTRAVENOUS | Status: DC
  Administered 2024-01-11 – 2024-01-12 (×2): 1500 mg via INTRAVENOUS
  Filled 2024-01-10 (×2): qty 300

## 2024-01-10 MED ORDER — ONDANSETRON HCL 4 MG/2ML IJ SOLN: 4.0000 mg | Freq: Four times a day (QID) | INTRAMUSCULAR | Status: DC | PRN

## 2024-01-10 MED ORDER — LACTATED RINGERS IV SOLN
150.0000 mL/h | INTRAVENOUS | Status: AC
  Administered 2024-01-10 (×2): 150 mL/h via INTRAVENOUS

## 2024-01-10 MED ORDER — INSULIN ASPART 100 UNIT/ML IJ SOLN
0.0000 [IU] | Freq: Three times a day (TID) | INTRAMUSCULAR | Status: DC
  Administered 2024-01-10: 3 [IU] via SUBCUTANEOUS
  Administered 2024-01-10: 1 [IU] via SUBCUTANEOUS
  Administered 2024-01-11: 2 [IU] via SUBCUTANEOUS
  Administered 2024-01-11 – 2024-01-12 (×3): 1 [IU] via SUBCUTANEOUS
  Administered 2024-01-12: 3 [IU] via SUBCUTANEOUS
  Filled 2024-01-10: qty 1
  Filled 2024-01-10: qty 3
  Filled 2024-01-10: qty 1
  Filled 2024-01-10: qty 2
  Filled 2024-01-10 (×2): qty 1
  Filled 2024-01-10: qty 3

## 2024-01-10 MED ORDER — SODIUM CHLORIDE 0.9 % IV SOLN
2.0000 g | Freq: Three times a day (TID) | INTRAVENOUS | Status: DC
  Administered 2024-01-10 – 2024-01-12 (×6): 2 g via INTRAVENOUS
  Filled 2024-01-10 (×7): qty 12.5

## 2024-01-10 MED ORDER — PANCRELIPASE (LIP-PROT-AMYL) 12000-38000 UNITS PO CPEP
24000.0000 [IU] | ORAL_CAPSULE | Freq: Three times a day (TID) | ORAL | Status: DC
  Administered 2024-01-10 – 2024-01-12 (×6): 24000 [IU] via ORAL
  Filled 2024-01-10 (×7): qty 2

## 2024-01-10 MED ORDER — INSULIN ASPART 100 UNIT/ML IJ SOLN: 0.0000 [IU] | Freq: Every day | INTRAMUSCULAR | Status: DC

## 2024-01-10 MED ORDER — APIXABAN 5 MG PO TABS
5.0000 mg | ORAL_TABLET | Freq: Two times a day (BID) | ORAL | Status: DC
  Administered 2024-01-11 – 2024-01-12 (×3): 5 mg via ORAL
  Filled 2024-01-10 (×3): qty 1

## 2024-01-10 MED ORDER — ONDANSETRON HCL 4 MG PO TABS: 4.0000 mg | ORAL_TABLET | Freq: Four times a day (QID) | ORAL | Status: DC | PRN

## 2024-01-10 MED ORDER — APIXABAN 5 MG PO TABS
10.0000 mg | ORAL_TABLET | Freq: Two times a day (BID) | ORAL | Status: AC
  Administered 2024-01-10 (×3): 10 mg via ORAL
  Filled 2024-01-10 (×3): qty 2

## 2024-01-10 MED ORDER — SODIUM CHLORIDE 0.9 % IV SOLN
2.0000 g | Freq: Once | INTRAVENOUS | Status: AC
  Administered 2024-01-10: 2 g via INTRAVENOUS
  Filled 2024-01-10: qty 12.5

## 2024-01-10 MED ORDER — ACETAMINOPHEN 650 MG RE SUPP: 650.0000 mg | Freq: Four times a day (QID) | RECTAL | Status: DC | PRN

## 2024-01-10 MED ORDER — ACETAMINOPHEN 325 MG PO TABS
650.0000 mg | ORAL_TABLET | Freq: Four times a day (QID) | ORAL | Status: DC | PRN
  Administered 2024-01-10 – 2024-01-11 (×2): 650 mg via ORAL
  Filled 2024-01-10 (×2): qty 2

## 2024-01-10 MED ORDER — VANCOMYCIN HCL IN DEXTROSE 1-5 GM/200ML-% IV SOLN: 1000.0000 mg | Freq: Once | INTRAVENOUS | Status: DC

## 2024-01-10 MED ORDER — VANCOMYCIN HCL 1750 MG/350ML IV SOLN
1750.0000 mg | Freq: Once | INTRAVENOUS | Status: AC
  Administered 2024-01-10: 1750 mg via INTRAVENOUS
  Filled 2024-01-10: qty 350

## 2024-01-10 MED ORDER — HYDROCODONE-ACETAMINOPHEN 5-325 MG PO TABS: 1.0000 | ORAL_TABLET | ORAL | Status: DC | PRN

## 2024-01-10 MED ORDER — INSULIN GLARGINE 100 UNIT/ML ~~LOC~~ SOLN
10.0000 [IU] | Freq: Every day | SUBCUTANEOUS | Status: DC
  Administered 2024-01-10 – 2024-01-12 (×3): 10 [IU] via SUBCUTANEOUS
  Filled 2024-01-10 (×3): qty 0.1

## 2024-01-10 NOTE — Assessment & Plan Note (Signed)
 Attributed to chemo Continue Creon 

## 2024-01-10 NOTE — Assessment & Plan Note (Signed)
"  Continue Eliquis   "
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
## 2024-01-10 NOTE — Progress Notes (Signed)
 CODE SEPSIS - PHARMACY COMMUNICATION  **Broad Spectrum Antibiotics should be administered within 1 hour of Sepsis diagnosis**  Time Code Sepsis Called/Page Received:  1/4 @ 0207   Antibiotics Ordered: Cefepime , Vancomycin    Time of 1st antibiotic administration: Cefepime  2 gm IV X 1 on 1/4 @ 0223   Additional action taken by pharmacy:  If necessary, Name of Provider/Nurse Contacted:     Dreon Pineda D ,PharmD Clinical Pharmacist  01/10/2024  2:24 AM

## 2024-01-10 NOTE — Progress Notes (Signed)
 Pharmacy Antibiotic Note  Kathleen Russell is a 69 y.o. female admitted on 01/09/2024 with sepsis.  Pharmacy has been consulted for Vancomycin , Cefepime  dosing.  Plan: Cefepime  2 gm IV X 1 given in ED on 1/4 @ 0223. Cefepime  2 gm IV Q8H ordered to start on 1/4 @ 1000.  Vancomycin  1750 mg IV X 1 ordered for 1/4 @ ~ 0500. Vancomycin  1500 mg IV Q24H ordered to start on 1/5 @ 0500.  AUC = 535.9 Vanc trough = 12.1  Height: 5' 4 (162.6 cm) Weight: 74.8 kg (165 lb) IBW/kg (Calculated) : 54.7  Temp (24hrs), Avg:98.9 F (37.2 C), Min:97.9 F (36.6 C), Max:100.7 F (38.2 C)  Recent Labs  Lab 01/03/24 0527 01/09/24 2204  WBC 8.8 14.0*  CREATININE 0.51 0.55  LATICACIDVEN  --  1.0    Estimated Creatinine Clearance: 65.7 mL/min (by C-G formula based on SCr of 0.55 mg/dL).    Allergies[1]  Antimicrobials this admission:   >>    >>   Dose adjustments this admission:   Microbiology results:  BCx:   UCx:    Sputum:    MRSA PCR:   Thank you for allowing pharmacy to be a part of this patients care.  Genowefa Morga D 01/10/2024 4:15 AM     [1]  Allergies Allergen Reactions   Latex Itching and Rash

## 2024-01-10 NOTE — Assessment & Plan Note (Signed)
 Continue basal insulin Sliding scale coverage

## 2024-01-10 NOTE — ED Provider Notes (Signed)
 2:01 AM Assumed care for off going team.   Blood pressure 112/66, pulse 91, temperature 98.2 F (36.8 C), temperature source Oral, resp. rate 15, height 5' 4 (1.626 m), weight 74.8 kg, SpO2 98%.  See their HPI for full report but in brief patient came in meeting sepsis criteria.  UA with some slight WBCs in it but no significant sign of infection lactate was normal.  CBC does show elevated white count.  Patient meets sepsis criteria unclear exact source.  Will send viral swab.  Discussed admission to the hospital for broad-spectrum antibiotics given patient is immunosuppressed with chemotherapy.       Ernest Ronal BRAVO, MD 01/10/24 [phone]

## 2024-01-10 NOTE — Assessment & Plan Note (Signed)
 On chemo, last treatment 12/12/2023

## 2024-01-10 NOTE — ED Notes (Signed)
 Pt to restroom

## 2024-01-10 NOTE — Hospital Course (Signed)
 SABRA

## 2024-01-10 NOTE — Progress Notes (Signed)
" °  Progress Note   Patient: Kathleen Russell FMW:[id number] DOB: 1955/04/30 DOA: 01/09/2024     0 DOS: the patient was seen and examined on 01/10/2024   Nonbillable note Patient seen and examined at bedside this morning She admits to improvement For detailed H&P please refer to assessment and plan as dictated by Dr. Cleatus earlier this morning  Vitals:   01/10/24 0713 01/10/24 0715 01/10/24 0730 01/10/24 0800  BP: 131/74 131/74 127/68 (!) 97/52  Pulse: (!) 105 99 99 95  Resp: 19 17 (!) 26 (!) 21  Temp: 99.9 F (37.7 C)     TempSrc: Oral     SpO2: 99% 100% 100% 96%  Weight:      Height:         Author: Drue ONEIDA Potter, MD 01/10/2024 4:13 PM  For on call review [email or web address].  "

## 2024-01-10 NOTE — Assessment & Plan Note (Addendum)
 Fever of unknown origin History of MRSA bacteremia 05/2023 Source unidentified but patient does have a port No thrombophlebitis on physical exam Sepsis fluids Broad-spectrum antibiotics of cefepime  vancomycin  and Flagyl  Follow blood cultures Consider oncology consult

## 2024-01-11 ENCOUNTER — Inpatient Hospital Stay: Admitting: Internal Medicine

## 2024-01-11 ENCOUNTER — Encounter: Payer: Self-pay | Admitting: Internal Medicine

## 2024-01-11 DIAGNOSIS — R6510 Systemic inflammatory response syndrome (SIRS) of non-infectious origin without acute organ dysfunction: Secondary | ICD-10-CM | POA: Diagnosis not present

## 2024-01-11 LAB — BASIC METABOLIC PANEL WITH GFR
Anion gap: 9 (ref 5–15)
BUN: 6 mg/dL — ABNORMAL LOW (ref 8–23)
CO2: 26 mmol/L (ref 22–32)
Calcium: 8.6 mg/dL — ABNORMAL LOW (ref 8.9–10.3)
Chloride: 102 mmol/L (ref 98–111)
Creatinine, Ser: 0.49 mg/dL (ref 0.44–1.00)
GFR, Estimated: >60 mL/min
Glucose, Bld: 137 mg/dL — ABNORMAL HIGH (ref 70–99)
Potassium: 4 mmol/L (ref 3.5–5.1)
Sodium: 136 mmol/L (ref 135–145)

## 2024-01-11 LAB — CBC WITH DIFFERENTIAL/PLATELET
Abs Immature Granulocytes: 0.05 K/uL (ref 0.00–0.07)
Basophils Absolute: 0.1 K/uL (ref 0.0–0.1)
Basophils Relative: 1 %
Eosinophils Absolute: 0.2 K/uL (ref 0.0–0.5)
Eosinophils Relative: 2 %
HCT: 28.2 % — ABNORMAL LOW (ref 36.0–46.0)
Hemoglobin: 9 g/dL — ABNORMAL LOW (ref 12.0–15.0)
Immature Granulocytes: 1 %
Lymphocytes Relative: 16 %
Lymphs Abs: 1.6 K/uL (ref 0.7–4.0)
MCH: 28.8 pg (ref 26.0–34.0)
MCHC: 31.9 g/dL (ref 30.0–36.0)
MCV: 90.4 fL (ref 80.0–100.0)
Monocytes Absolute: 0.9 K/uL (ref 0.1–1.0)
Monocytes Relative: 9 %
Neutro Abs: 7.4 K/uL (ref 1.7–7.7)
Neutrophils Relative %: 71 %
Platelets: 159 K/uL (ref 150–400)
RBC: 3.12 MIL/uL — ABNORMAL LOW (ref 3.87–5.11)
RDW: 14.1 % (ref 11.5–15.5)
WBC: 10.2 K/uL (ref 4.0–10.5)
nRBC: 0 % (ref 0.0–0.2)

## 2024-01-11 LAB — GLUCOSE, CAPILLARY
Glucose-Capillary: 145 mg/dL — ABNORMAL HIGH (ref 70–99)
Glucose-Capillary: 198 mg/dL — ABNORMAL HIGH (ref 70–99)
Glucose-Capillary: 129 mg/dL — ABNORMAL HIGH (ref 70–99)
Glucose-Capillary: 152 mg/dL — ABNORMAL HIGH (ref 70–99)

## 2024-01-11 NOTE — Plan of Care (Signed)

## 2024-01-11 NOTE — TOC CM/SW Note (Signed)
 Transition of Care [facility]) - Inpatient Brief Assessment   Patient Details  Name: Kathleen Russell MRN: [id number] Date of Birth: [birth year]  Transition of Care [facility]) CM/SW Contact:    Grayce JAYSON Perfect, RN Phone Number: 01/11/2024, 12:24 PM   Clinical Narrative: Transition of Care Department [facility]) has reviewed patient and no TOC needs have been identified at this time.  If new patient transition needs arise, please place a TOC consult.     Transition of Care Asessment: Insurance and Status: Insurance coverage has been reviewed Patient has primary care physician: Yes Home environment has been reviewed: lives in single family home with husband Prior level of function:: Independent Prior/Current Home Services: No current home services Social Drivers of Health Review: SDOH reviewed no interventions necessary Readmission risk has been reviewed: Yes Transition of care needs: no transition of care needs at this time

## 2024-01-11 NOTE — Progress Notes (Signed)
 " Progress Note   Patient: Kathleen Russell FMW:[id number] DOB: 08-May-1955 DOA: 01/09/2024     1 DOS: the patient was seen and examined on 01/11/2024   Brief hospital course:  From HPI Kathleen Russell is a 69 y.o. female with medical history significant for DM, pancreatic cancer on chemo via port, recently hospitalized with acute PE and extensive left lower extremity DVT, discharged on Eliquis  on 01/04/2024, being admitted with possible sepsis after presenting with a fever.  She states since her discharge on 12/29 she has been having low-grade fevers however in the past 24 hours she started sleeping a lot and was more febrile with a temp of 101.  She denies cough or shortness of breath, vomiting or abdominal pain or dysuria.  Has had diarrhea, attributed to chemo which has improved with Creon .  Denies pain or redness in her left leg where she had the DVT.  Has a history of MRSA bacteremia in May 2025 leading to port removal 06/19/2023.  TEE was negative.  States that back then the area around the port was red and inflamed but it looks normal now. On arrival, temp 100.7 with heart rate 131 respirations 25, BP 136/99. Labs with WBC 14,000, up from 8.8 a week ago with lactic acid 1.0.  Urinalysis and respiratory viral panel without infection.  CBC and CMP otherwise at baseline EKG showed sinus at 94 Chest x-ray nonacute.    Assessment and Plan:  SIRS, possible sepsis (systemic inflammatory response syndrome) (HCC) Fever of unknown origin History of MRSA bacteremia 05/2023 Urinalysis showing possible UTI Source unidentified but patient does have a port No thrombophlebitis on physical exam Sepsis fluids Continue broad-spectrum antibiotics of cefepime  vancomycin  and Flagyl  Follow blood cultures   Recent DVT/pulmonary embolism, 12/2023 Continue Eliquis    Malignant neoplasm of tail of pancreas (HCC) On chemo, last treatment 12/12/2023   Diabetes mellitus, type II (HCC) Continue basal  insulin  Sliding scale coverage   Chronic diarrhea Attributed to chemo Continue Creon    DVT prophylaxis: Eliquis    Consults: none   Advance Care Planning:   Code Status: Prior    Family Communication: none   Disposition Plan: Back to previous home environment    Subjective:  Patient seen and examined at bedside this morning Admits to improvement in her general condition Denies abdominal pain chest pain cough  Physical Exam: Constitutional:      General: She is not in acute distress. HENT:     Head: Normocephalic and atraumatic.  Cardiovascular:     Heart sounds: Normal heart sounds.  Pulmonary:     Effort: Pulmonary effort is normal.     Breath sounds: Normal breath sounds.  Chest:     Comments: Area around port without signs of infection Abdominal:     Palpations: Abdomen is soft.     Tenderness: There is no abdominal tenderness.  Musculoskeletal:        General: No tenderness.  Skin:    Findings: No erythema or rash.  Neurological:     Mental Status: Mental status is at baseline.   Vitals:   01/11/24 0354 01/11/24 0554 01/11/24 0954 01/11/24 1354  BP: 125/70 (!) 114/57 133/86 129/80  Pulse: (!) 118 (!) 108 (!) 124 (!) 102  Resp:   17 17  Temp: 99.6 F (37.6 C) 99 F (37.2 C) 98.8 F (37.1 C) 99.1 F (37.3 C)  TempSrc: Oral Oral Oral Oral  SpO2: 97% 94% 98% 97%  Weight:      Height:  Data Reviewed: Reviewed Chest x-ray that did not show any acute cardiopulmonary pathology    Latest Ref Rng & Units 01/11/2024    9:02 AM 01/09/2024   10:04 PM 01/03/2024    5:27 AM  CBC  WBC 4.0 - 10.5 K/uL 10.2  14.0  8.8   Hemoglobin 12.0 - 15.0 g/dL 9.0  9.8  9.8   Hematocrit 36.0 - 46.0 % 28.2  32.0  30.7   Platelets 150 - 400 K/uL 159  183  125        Latest Ref Rng & Units 01/11/2024    9:02 AM 01/09/2024   10:04 PM 01/03/2024    5:27 AM  BMP  Glucose 70 - 99 mg/dL 862  837  855   BUN 8 - 23 mg/dL 6  13  6    Creatinine 0.44 - 1.00 mg/dL 9.50  9.44   9.48   Sodium 135 - 145 mmol/L 136  134  141   Potassium 3.5 - 5.1 mmol/L 4.0  5.0  3.4   Chloride 98 - 111 mmol/L 102  98  106   CO2 22 - 32 mmol/L 26  23  26    Calcium  8.9 - 10.3 mg/dL 8.6  8.7  8.4      Author: Drue ONEIDA Potter, MD 01/11/2024 5:22 PM  For on call review [email or web address].  "

## 2024-01-12 ENCOUNTER — Other Ambulatory Visit: Payer: Self-pay

## 2024-01-12 DIAGNOSIS — R6510 Systemic inflammatory response syndrome (SIRS) of non-infectious origin without acute organ dysfunction: Secondary | ICD-10-CM | POA: Diagnosis not present

## 2024-01-12 LAB — CBC WITH DIFFERENTIAL/PLATELET
Abs Immature Granulocytes: 0.04 K/uL (ref 0.00–0.07)
Basophils Absolute: 0 K/uL (ref 0.0–0.1)
Basophils Relative: 0 %
Eosinophils Absolute: 0.2 K/uL (ref 0.0–0.5)
Eosinophils Relative: 2 %
HCT: 28.1 % — ABNORMAL LOW (ref 36.0–46.0)
Hemoglobin: 8.9 g/dL — ABNORMAL LOW (ref 12.0–15.0)
Immature Granulocytes: 0 %
Lymphocytes Relative: 13 %
Lymphs Abs: 1.3 K/uL (ref 0.7–4.0)
MCH: 28.4 pg (ref 26.0–34.0)
MCHC: 31.7 g/dL (ref 30.0–36.0)
MCV: 89.8 fL (ref 80.0–100.0)
Monocytes Absolute: 1 K/uL (ref 0.1–1.0)
Monocytes Relative: 10 %
Neutro Abs: 7.8 K/uL — ABNORMAL HIGH (ref 1.7–7.7)
Neutrophils Relative %: 75 %
Platelets: 150 K/uL (ref 150–400)
RBC: 3.13 MIL/uL — ABNORMAL LOW (ref 3.87–5.11)
RDW: 14.1 % (ref 11.5–15.5)
WBC: 10.4 K/uL (ref 4.0–10.5)
nRBC: 0 % (ref 0.0–0.2)

## 2024-01-12 LAB — BASIC METABOLIC PANEL WITH GFR
Anion gap: 10 (ref 5–15)
BUN: 9 mg/dL (ref 8–23)
CO2: 24 mmol/L (ref 22–32)
Calcium: 8.4 mg/dL — ABNORMAL LOW (ref 8.9–10.3)
Chloride: 101 mmol/L (ref 98–111)
Creatinine, Ser: 0.5 mg/dL (ref 0.44–1.00)
GFR, Estimated: >60 mL/min
Glucose, Bld: 157 mg/dL — ABNORMAL HIGH (ref 70–99)
Potassium: 3.6 mmol/L (ref 3.5–5.1)
Sodium: 135 mmol/L (ref 135–145)

## 2024-01-12 LAB — GLUCOSE, CAPILLARY
Glucose-Capillary: 143 mg/dL — ABNORMAL HIGH (ref 70–99)
Glucose-Capillary: 212 mg/dL — ABNORMAL HIGH (ref 70–99)

## 2024-01-12 MED ORDER — CEPHALEXIN 500 MG PO CAPS
500.0000 mg | ORAL_CAPSULE | Freq: Three times a day (TID) | ORAL | 0 refills | Status: AC
  Filled 2024-01-12: qty 12, 4d supply, fill #0

## 2024-01-12 MED ORDER — HEPARIN SOD (PORK) LOCK FLUSH 100 UNIT/ML IV SOLN
500.0000 [IU] | INTRAVENOUS | Status: AC | PRN
  Administered 2024-01-12: 500 [IU]
  Filled 2024-01-12: qty 5

## 2024-01-12 MED ORDER — CEPHALEXIN 500 MG PO CAPS: 500.0000 mg | ORAL_CAPSULE | Freq: Three times a day (TID) | ORAL | Status: DC

## 2024-01-12 NOTE — Discharge Summary (Signed)
 " Physician Discharge Summary   Patient: Kathleen Russell MRN: [id number] DOB: [birth year]  Admit date:     01/09/2024  Discharge date: 01/12/2024  Discharge Physician: Drue ONEIDA Potter   PCP: Marylynn Verneita CROME, MD   Recommendations at discharge:  Follow-up with PCP and your oncologist  Discharge Diagnoses: Principal Problem:   SIRS, possible sepsis (systemic inflammatory response syndrome) (HCC) Active Problems:   Recent DVT/pulmonary embolism, 12/2023   Malignant neoplasm of tail of pancreas (HCC)   Chronic diarrhea   Diabetes mellitus, type II (HCC)  Resolved Problems:   * No resolved hospital problems. [facility] Course:   From HPI Kathleen Russell is a 69 y.o. female with medical history significant for DM, pancreatic cancer on chemo via port, recently hospitalized with acute PE and extensive left lower extremity DVT, discharged on Eliquis  on 01/04/2024, being admitted with possible sepsis after presenting with a fever.  She states since her discharge on 12/29 she has been having low-grade fevers however in the past 24 hours she started sleeping a lot and was more febrile with a temp of 101.  She denies cough or shortness of breath, vomiting or abdominal pain or dysuria.  Has had diarrhea, attributed to chemo which has improved with Creon .  Denies pain or redness in her left leg where she had the DVT.  Has a history of MRSA bacteremia in May 2025 leading to port removal 06/19/2023.  TEE was negative.  States that back then the area around the port was red and inflamed but it looks normal now. On arrival, temp 100.7 with heart rate 131 respirations 25, BP 136/99. Labs with WBC 14,000, up from 8.8 a week ago with lactic acid 1.0.  Urinalysis and respiratory viral panel without infection.  CBC and CMP otherwise at baseline EKG showed sinus at 94 Chest x-ray nonacute.     Assessment and Plan:   SIRS, possible sepsis (systemic inflammatory response syndrome) (HCC History of MRSA bacteremia  05/2023 Urinalysis showing possible UTI Source unidentified but patient does have a port No thrombophlebitis on physical exam Sepsis fluids Culture result did not show any growth Antibiotics have been de-escalated as patient is feeling well and therefore being discharged   Recent DVT/pulmonary embolism, 12/2023 Continue Eliquis    Malignant neoplasm of tail of pancreas (HCC) On chemo, last treatment 12/12/2023   Diabetes mellitus, type II (HCC) Continue basal insulin  Sliding scale coverage   Chronic diarrhea Attributed to chemo Continue Creon     Consultants: None Procedures performed: None Disposition: Home Diet recommendation:  Cardiac diet DISCHARGE MEDICATION: Allergies as of 01/12/2024       Reactions   Latex Itching, Rash        Medication List     TAKE these medications    acetaminophen  500 MG tablet Commonly known as: TYLENOL  Take 500 mg by mouth every 8 (eight) hours as needed.   blood glucose meter kit and supplies Dispense based on patient and insurance preference. Use up to four times daily as directed. (FOR ICD-10 E10.9, E11.9).   cephALEXin  500 MG capsule Commonly known as: KEFLEX  Take 1 capsule (500 mg total) by mouth every 8 (eight) hours for 4 days.   Creon  24000-76000 units Cpep Generic drug: Pancrelipase  (Lip-Prot-Amyl) Take 1 capsule (24,000 Units total) by mouth 3 (three) times daily before meals.   diphenoxylate -atropine  2.5-0.025 MG tablet Commonly known as: LOMOTIL  Take 1 tablet by mouth 4 (four) times daily as needed for diarrhea or loose stools.  Eliquis  5 MG Tabs tablet Generic drug: apixaban  Take 2 tablets (10 mg total) by mouth 2 (two) times daily for 7 days, THEN 1 tablet (5 mg total) 2 (two) times daily. Start taking on: January 04, 2024   fluticasone  50 MCG/ACT nasal spray Commonly known as: FLONASE  SHAKE LIQUID AND USE 2 SPRAYS IN EACH NOSTRIL AT BEDTIME AS NEEDED   FreeStyle Libre 3 Plus Sensor Misc Change sensor  every 15 days.   Januvia  25 MG tablet Generic drug: sitaGLIPtin  TAKE 1 TABLET(25 MG) BY MOUTH DAILY   Lantus  SoloStar 100 UNIT/ML Solostar Pen Generic drug: insulin  glargine Inject 10 Units into the skin daily. 30 units on steroid days,  10 units all other days   loperamide  2 MG capsule Commonly known as: IMODIUM  Take 2 tabs by mouth with first loose stool, then 1 tab with each additional loose stool as needed. Do not exceed 8 tabs in a 24-hour period   magic mouthwash (multi-ingredient) oral suspension Swish and swallow 5-10 mLs 4 (four) times daily.   ondansetron  8 MG tablet Commonly known as: Zofran  Take 1 tablet (8 mg total) by mouth every 8 (eight) hours as needed for nausea or vomiting. Start on the third day after chemotherapy   pantoprazole  20 MG tablet Commonly known as: PROTONIX  TAKE 1 TABLET(20 MG) BY MOUTH DAILY   Pen Needles 32G X 6 MM Misc Use to give insulin    potassium chloride  10 MEQ tablet Commonly known as: KLOR-CON  M Take 1 tablet (10 mEq total) by mouth daily.   rosuvastatin  20 MG tablet Commonly known as: CRESTOR  TAKE 1 TABLET(20 MG) BY MOUTH DAILY   Xyzal Allergy 24HR 5 MG tablet Generic drug: levocetirizine Take 5 mg by mouth every evening.        Follow-up Information     Marylynn Verneita CROME, MD Follow up.   Specialty: Internal Medicine Why: hospital follow up Contact information: [address] [place] [address] [phone]                Discharge Exam: Fredricka Weights   01/09/24 2030 01/09/24 2052 01/10/24 2008  Weight: 73.5 kg 74.8 kg 74.8 kg     General: She is not in acute distress. HENT:     Head: Normocephalic and atraumatic.  Cardiovascular:     Heart sounds: Normal heart sounds.  Pulmonary:     Effort: Pulmonary effort is normal.     Breath sounds: Normal breath sounds.  Chest:     Comments: Area around port without signs of infection Abdominal:     Palpations: Abdomen is soft.      Tenderness: There is no abdominal tenderness.  Musculoskeletal:        General: No tenderness.  Skin:    Findings: No erythema or rash.  Neurological:   Condition at discharge: good  The results of significant diagnostics from this hospitalization (including imaging, microbiology, ancillary and laboratory) are listed below for reference.   Imaging Studies: DG Chest Port 1 View Result Date: 01/09/2024 EXAM: 1 VIEW(S) XRAY OF THE CHEST 01/09/2024 09:44:00 PM COMPARISON: None available. CLINICAL HISTORY: Questionable sepsis - evaluate for abnormality. Fever. History of stage 4 pancreatic cancer with liver metastases. FINDINGS: LINES, TUBES AND DEVICES: Power port type central venous catheter with tip at the cavoatrial junction region. LUNGS AND PLEURA: Shallow inspiration. Lungs are clear. No pleural effusion or pneumothorax. HEART AND MEDIASTINUM: Heart size and pulmonary vascularity are normal. Mediastinal contours appear intact. Calcification of the aorta. BONES  AND SOFT TISSUES: No acute osseous abnormality. IMPRESSION: 1. No acute cardiopulmonary abnormality. 2. Central venous catheter tip projects at the cavoatrial junction. Electronically signed by: Elsie Gravely MD 01/09/2024 09:50 PM EST RP Workstation: [id number]   ECHOCARDIOGRAM COMPLETE Result Date: 01/03/2024    ECHOCARDIOGRAM REPORT   Patient Name:   FELESHA MONCRIEFFE Date of Exam: 01/03/2024 Medical Rec #:  [id number]        Height:       64.0 in Accession #:    [id number]       Weight:       170.0 lb Date of Birth:  [birth year]         BSA:          1.826 m Patient Age:    68 years         BP:           114/55 mmHg Patient Gender: F                HR:           98 bpm. Exam Location:  [facility] Procedure: 2D Echo, Color Doppler and Cardiac Doppler (Both Spectral and Color            Flow Doppler were utilized during procedure). Indications:     I26.09 Pulmonary Embolus  History:         Patient has prior history of Echocardiogram examinations.                   Pancreatic CA; Risk Factors:Diabetes.  Sonographer:     L. Thornton-Maynard, RDCS Referring Phys:  [id number] VELNA SAUNDERS [facility] Diagnosing Phys: Redell Cave MD  Sonographer Comments: Global longitudinal strain was attempted. IMPRESSIONS  1. Left ventricular ejection fraction, by estimation, is 60 to 65%. The left ventricle has normal function. The left ventricle has no regional wall motion abnormalities. Left ventricular diastolic parameters are consistent with Grade I diastolic dysfunction (impaired relaxation).  2. Right ventricular systolic function is normal. The right ventricular size is normal. There is normal pulmonary artery systolic pressure.  3. The mitral valve is normal in structure. Mild mitral valve regurgitation.  4. The aortic valve is tricuspid. Aortic valve regurgitation is not visualized.  5. The inferior vena cava is normal in size with greater than 50% respiratory variability, suggesting right atrial pressure of 3 mmHg. FINDINGS  Left Ventricle: Left ventricular ejection fraction, by estimation, is 60 to 65%. The left ventricle has normal function. The left ventricle has no regional wall motion abnormalities. Global longitudinal strain performed but not reported based on interpreter judgement due to suboptimal tracking. The left ventricular internal cavity size was normal in size. There is no left ventricular hypertrophy. Left ventricular diastolic parameters are consistent with Grade I diastolic dysfunction (impaired relaxation). Right Ventricle: The right ventricular size is normal. No increase in right ventricular wall thickness. Right ventricular systolic function is normal. There is normal pulmonary artery systolic pressure. The tricuspid regurgitant velocity is 2.28 m/s, and  with an assumed right atrial pressure of 3 mmHg, the estimated right ventricular systolic pressure is 23.8 mmHg. Left Atrium: Left atrial size was normal in size. Right Atrium: Right atrial size was  normal in size. Pericardium: There is no evidence of pericardial effusion. Mitral Valve: The mitral valve is normal in structure. Mild mitral valve regurgitation. MV peak gradient, 6.0 mmHg. The mean mitral valve gradient is 3.0 mmHg. Tricuspid Valve: The tricuspid valve is normal in structure. Tricuspid valve regurgitation  is not demonstrated. Aortic Valve: The aortic valve is tricuspid. Aortic valve regurgitation is not visualized. Aortic valve mean gradient measures 3.5 mmHg. Aortic valve peak gradient measures 6.0 mmHg. Aortic valve area, by VTI measures 3.07 cm. Pulmonic Valve: The pulmonic valve was normal in structure. Pulmonic valve regurgitation is not visualized. Aorta: The aortic root and ascending aorta are structurally normal, with no evidence of dilitation. Venous: The inferior vena cava is normal in size with greater than 50% respiratory variability, suggesting right atrial pressure of 3 mmHg. IAS/Shunts: No atrial level shunt detected by color flow Doppler.  LEFT VENTRICLE PLAX 2D LVIDd:         3.70 cm     Diastology LVIDs:         2.30 cm     LV e' medial:    7.18 cm/s LV PW:         1.10 cm     LV E/e' medial:  11.0 LV IVS:        1.00 cm     LV e' lateral:   5.87 cm/s LVOT diam:     2.00 cm     LV E/e' lateral: 13.5 LV SV:         67 LV SV Index:   36 LVOT Area:     3.14 cm LV IVRT:       103 msec  LV Volumes (MOD) LV vol d, MOD A2C: 39.4 ml LV vol d, MOD A4C: 43.0 ml LV vol s, MOD A2C: 14.5 ml LV vol s, MOD A4C: 9.1 ml LV SV MOD A2C:     24.9 ml LV SV MOD A4C:     43.0 ml LV SV MOD BP:      29.0 ml RIGHT VENTRICLE RV Basal diam:  2.70 cm RV Mid diam:    2.00 cm RV S prime:     11.00 cm/s TAPSE (M-mode): 1.6 cm LEFT ATRIUM             Index        RIGHT ATRIUM           Index LA diam:        3.80 cm 2.08 cm/m   RA Area:     10.70 cm LA Vol (A2C):   34.9 ml 19.12 ml/m  RA Volume:   20.40 ml  11.17 ml/m LA Vol (A4C):   44.6 ml 24.43 ml/m LA Biplane Vol: 40.7 ml 22.29 ml/m  AORTIC VALVE                     PULMONIC VALVE AV Area (Vmax):    2.82 cm     PV Vmax:       0.85 m/s AV Area (Vmean):   2.81 cm     PV Peak grad:  2.9 mmHg AV Area (VTI):     3.07 cm AV Vmax:           122.50 cm/s AV Vmean:          85.750 cm/s AV VTI:            0.217 m AV Peak Grad:      6.0 mmHg AV Mean Grad:      3.5 mmHg LVOT Vmax:         110.00 cm/s LVOT Vmean:        76.700 cm/s LVOT VTI:          0.212 m LVOT/AV VTI ratio: 0.98  AORTA  Ao Root diam: 3.60 cm Ao Asc diam:  3.40 cm MITRAL VALVE                TRICUSPID VALVE MV Area VTI:  3.33 cm      TR Peak grad:   20.8 mmHg MV Peak grad: 6.0 mmHg      TR Vmax:        228.00 cm/s MV Mean grad: 3.0 mmHg MV Vmax:      1.22 m/s      SHUNTS MV Vmean:     88.3 cm/s     Systemic VTI:  0.21 m MV E velocity: 79.10 cm/s   Systemic Diam: 2.00 cm MV A velocity: 107.00 cm/s MV E/A ratio:  0.74 Redell Cave MD Electronically signed by Redell Cave MD Signature Date/Time: 01/03/2024/1:26:46 PM    Final    CT HEAD WO CONTRAST ( ) Result Date: 01/02/2024 EXAM: CT HEAD WITHOUT CONTRAST 01/02/2024 05:17:18 PM TECHNIQUE: CT of the head was performed without the administration of intravenous contrast. Automated exposure control, iterative reconstruction, and/or weight based adjustment of the mA/kV was utilized to reduce the radiation dose to as low as reasonably achievable. COMPARISON: MRI 12/25/2020. CLINICAL HISTORY: Headache, new onset (Age >= 51y). FINDINGS: BRAIN AND VENTRICLES: No acute hemorrhage. No evidence of acute infarct. No hydrocephalus. No extra-axial collection. No mass effect or midline shift. ORBITS: No acute abnormality. SINUSES: No acute abnormality. SOFT TISSUES AND SKULL: No acute soft tissue abnormality. No skull fracture. IMPRESSION: 1. No acute intracranial abnormality. Electronically signed by: Donnice Mania MD 01/02/2024 06:00 PM EST RP Workstation: [id number]   CT Angio Chest PE W and/or Wo Contrast Result Date: 01/02/2024 CLINICAL DATA:   History of metastatic pancreatic cancer presenting with left calf pain and suspected pulmonary embolism. EXAM: CT ANGIOGRAPHY CHEST WITH CONTRAST TECHNIQUE: Multidetector CT imaging of the chest was performed using the standard protocol during bolus administration of intravenous contrast. Multiplanar CT image reconstructions and MIPs were obtained to evaluate the vascular anatomy. RADIATION DOSE REDUCTION: This exam was performed according to the departmental dose-optimization program which includes automated exposure control, adjustment of the mA and/or kV according to patient size and/or use of iterative reconstruction technique. CONTRAST:  75mL OMNIPAQUE  IOHEXOL  350 MG/ML SOLN COMPARISON:  November 23, 2023 FINDINGS: Cardiovascular: There is stable left-sided venous Port-A-Cath positioning. Satisfactory opacification of the pulmonary arteries to the segmental level. A moderate amount of intraluminal low attenuation is seen within the distal aspect of the right pulmonary artery, with extension to involve its right middle lobe and right lower lobe branches. Normal heart size. Very mild right heart strain is noted (RV/LV ratio of 1.06). No pericardial effusion. Mediastinum/Nodes: No enlarged mediastinal, hilar, or axillary lymph nodes. Thyroid  gland, trachea, and esophagus demonstrate no significant findings. Lungs/Pleura: Mild atelectasis is seen within the posterior aspect of the bilateral lower lobes. No acute infiltrate, pleural effusion or pneumothorax is identified. Upper Abdomen: No acute abnormality. Musculoskeletal: No chest wall abnormality. No acute or significant osseous findings. Review of the MIP images confirms the above findings. IMPRESSION: 1. Moderate amount of pulmonary embolism within the distal aspect of the right pulmonary artery, with extension to involve its right middle lobe and right lower lobe branches. 2. Very mild right heart strain (RV/LV ratio of 1.06). 3. Mild bilateral lower lobe  atelectasis. Electronically Signed   By: Suzen Dials M.D.   On: 01/02/2024 17:26   US  Venous Img Lower Unilateral Left Result Date: 01/02/2024 CLINICAL DATA:  LEFT calf swelling  for 12 days EXAM: LEFT LOWER EXTREMITY VENOUS DOPPLER ULTRASOUND TECHNIQUE: Gray-scale sonography with graded compression, as well as color Doppler and duplex ultrasound were performed to evaluate the lower extremity deep venous systems from the level of the common femoral vein and including the common femoral, femoral, profunda femoral, popliteal and calf veins including the posterior tibial, peroneal and gastrocnemius veins when visible. The superficial great saphenous vein was also interrogated. Spectral Doppler was utilized to evaluate flow at rest and with distal augmentation maneuvers in the common femoral, femoral and popliteal veins. COMPARISON:  None available FINDINGS: Contralateral Common Femoral Vein: Respiratory phasicity is normal and symmetric with the symptomatic side. No evidence of thrombus. Normal compressibility. Common Femoral Vein: Intramural filling defect with decreased flow and compressibility of the common femoral vein is consistent with acute DVT. Saphenofemoral Junction: No evidence of thrombus. Normal compressibility and flow on color Doppler imaging. Profunda Femoral Vein: No evidence of thrombus. Normal compressibility and flow on color Doppler imaging. Femoral Vein: Intramural filling defect with decreased flow and compressibility of the LEFT femoral vein is consistent with acute DVT. Popliteal Vein: Intramural filling defect with decreased flow and compressibility of the LEFT popliteal vein is consistent with acute DVT. Calf Veins: Intramural filling defect with decreased flow and compressibility of the posterior tibial and peroneal veins is consistent with acute DVT. Superficial Great Saphenous Vein: No evidence of thrombus. Normal compressibility. Other Findings:  None. IMPRESSION: Extensive acute  DVT of the LEFT lower extremity involving the common femoral, femoral, popliteal, posterior tibial and peroneal veins. Electronically Signed   By: Aliene Lloyd M.D.   On: 01/02/2024 15:49   DG Chest 2 View Result Date: 01/02/2024 CLINICAL DATA:  Tachycardia EXAM: CHEST - 2 VIEW COMPARISON:  06/17/2023 FINDINGS: Tip of the left chest port overlies the SVC. The cardiomediastinal contours are normal. The lungs are clear. Pulmonary vasculature is normal. No consolidation, pleural effusion, or pneumothorax. No acute osseous abnormalities are seen. IMPRESSION: No active cardiopulmonary disease. Electronically Signed   By: Andrea Gasman M.D.   On: 01/02/2024 15:39    Microbiology: Results for orders placed or performed during the hospital encounter of 01/09/24  Blood Culture (routine x 2)     Status: None (Preliminary result)   Collection Time: 01/09/24 10:04 PM   Specimen: BLOOD  Result Value Ref Range Status   Specimen Description BLOOD RIGHT ANTECUBITAL  Final   Special Requests   Final    BOTTLES DRAWN AEROBIC AND ANAEROBIC Blood Culture results may not be optimal due to an inadequate volume of blood received in culture bottles   Culture   Final    NO GROWTH 3 DAYS Performed at [facility], [address], [place], [place] [address]    Report Status PENDING  Incomplete  Blood Culture (routine x 2)     Status: None (Preliminary result)   Collection Time: 01/09/24 10:04 PM   Specimen: BLOOD  Result Value Ref Range Status   Specimen Description BLOOD PORTA CATH  Final   Special Requests   Final    BOTTLES DRAWN AEROBIC AND ANAEROBIC Blood Culture adequate volume   Culture   Final    NO GROWTH 3 DAYS Performed at [facility], [address] [place], [place] [address]    Report Status PENDING  Incomplete  Resp panel by RT-PCR (RSV, Flu A&B, Covid) Anterior Nasal Swab     Status: None   Collection Time: 01/09/24 10:04 PM   Specimen: Anterior Nasal Swab  Result  Value Ref Range Status   SARS Coronavirus 2 by RT PCR NEGATIVE NEGATIVE Final    Comment: (NOTE) SARS-CoV-2 target nucleic acids are NOT DETECTED.  The SARS-CoV-2 RNA is generally detectable in upper respiratory specimens during the acute phase of infection. The lowest concentration of SARS-CoV-2 viral copies this assay can detect is 138 copies/mL. A negative result does not preclude SARS-Cov-2 infection and should not be used as the sole basis for treatment or other patient management decisions. A negative result may occur with  improper specimen collection/handling, submission of specimen other than nasopharyngeal swab, presence of viral mutation(s) within the areas targeted by this assay, and inadequate number of viral copies(<138 copies/mL). A negative result must be combined with clinical observations, patient history, and epidemiological information. The expected result is Negative.  Fact Sheet for Patients:  bloggercourse.com  Fact Sheet for Healthcare Providers:  seriousbroker.it  This test is no t yet approved or cleared by the [place]  FDA and  has been authorized for detection and/or diagnosis of SARS-CoV-2 by FDA under an Emergency Use Authorization (EUA). This EUA will remain  in effect (meaning this test can be used) for the duration of the COVID-19 declaration under Section 564(b)(1) of the Act, 21 U.S.C.section 360bbb-3(b)(1), unless the authorization is terminated  or revoked sooner.       Influenza A by PCR NEGATIVE NEGATIVE Final   Influenza B by PCR NEGATIVE NEGATIVE Final    Comment: (NOTE) The Xpert Xpress SARS-CoV-2/FLU/RSV plus assay is intended as an aid in the diagnosis of influenza from Nasopharyngeal swab specimens and should not be used as a sole basis for treatment. Nasal washings and aspirates are unacceptable for Xpert Xpress SARS-CoV-2/FLU/RSV testing.  Fact Sheet for  Patients: bloggercourse.com  Fact Sheet for Healthcare Providers: seriousbroker.it  This test is not yet approved or cleared by the [place]  FDA and has been authorized for detection and/or diagnosis of SARS-CoV-2 by FDA under an Emergency Use Authorization (EUA). This EUA will remain in effect (meaning this test can be used) for the duration of the COVID-19 declaration under Section 564(b)(1) of the Act, 21 U.S.C. section 360bbb-3(b)(1), unless the authorization is terminated or revoked.     Resp Syncytial Virus by PCR NEGATIVE NEGATIVE Final    Comment: (NOTE) Fact Sheet for Patients: bloggercourse.com  Fact Sheet for Healthcare Providers: seriousbroker.it  This test is not yet approved or cleared by the [place]  FDA and has been authorized for detection and/or diagnosis of SARS-CoV-2 by FDA under an Emergency Use Authorization (EUA). This EUA will remain in effect (meaning this test can be used) for the duration of the COVID-19 declaration under Section 564(b)(1) of the Act, 21 U.S.C. section 360bbb-3(b)(1), unless the authorization is terminated or revoked.  Performed at [facility], [address] [place], [place] [address]   Respiratory (~20 pathogens) panel by PCR     Status: None   Collection Time: 01/10/24  3:03 AM   Specimen: Nasopharyngeal Swab; Respiratory  Result Value Ref Range Status   Adenovirus NOT DETECTED NOT DETECTED Final   Coronavirus 229E NOT DETECTED NOT DETECTED Final    Comment: (NOTE) The Coronavirus on the Respiratory Panel, DOES NOT test for the novel  Coronavirus (2019 nCoV)    Coronavirus HKU1 NOT DETECTED NOT DETECTED Final   Coronavirus NL63 NOT DETECTED NOT DETECTED Final   Coronavirus OC43 NOT DETECTED NOT DETECTED Final   Metapneumovirus NOT DETECTED NOT DETECTED Final   Rhinovirus / Enterovirus NOT  DETECTED NOT  DETECTED Final   Influenza A NOT DETECTED NOT DETECTED Final   Influenza B NOT DETECTED NOT DETECTED Final   Parainfluenza Virus 1 NOT DETECTED NOT DETECTED Final   Parainfluenza Virus 2 NOT DETECTED NOT DETECTED Final   Parainfluenza Virus 3 NOT DETECTED NOT DETECTED Final   Parainfluenza Virus 4 NOT DETECTED NOT DETECTED Final   Respiratory Syncytial Virus NOT DETECTED NOT DETECTED Final   Bordetella pertussis NOT DETECTED NOT DETECTED Final   Bordetella Parapertussis NOT DETECTED NOT DETECTED Final   Chlamydophila pneumoniae NOT DETECTED NOT DETECTED Final   Mycoplasma pneumoniae NOT DETECTED NOT DETECTED Final    Comment: Performed at [facility], [address], [place], [place] [address]    Labs: CBC: Recent Labs  Lab 01/09/24 2204 01/11/24 0902 01/12/24 0543  WBC 14.0* 10.2 10.4  NEUTROABS 10.6* 7.4 7.8*  HGB 9.8* 9.0* 8.9*  HCT 32.0* 28.2* 28.1*  MCV 92.5 90.4 89.8  PLT 183 159 150   Basic Metabolic Panel: Recent Labs  Lab 01/09/24 2204 01/11/24 0902 01/12/24 0543  NA 134* 136 135  K 5.0 4.0 3.6  CL 98 102 101  CO2 23 26 24   GLUCOSE 162* 137* 157*  BUN 13 6* 9  CREATININE 0.55 0.49 0.50  CALCIUM  8.7* 8.6* 8.4*   Liver Function Tests: Recent Labs  Lab 01/09/24 2204  AST 48*  ALT 24  ALKPHOS 115  BILITOT 0.4  PROT 7.0  ALBUMIN 3.6   CBG: Recent Labs  Lab 01/11/24 1219 01/11/24 1554 01/11/24 2141 01/12/24 0900 01/12/24 1154  GLUCAP 198* 129* 152* 143* 212*    Discharge time spent:  37 minutes.  Signed: Drue ONEIDA Potter, MD [facility] 01/12/2024 "

## 2024-01-12 NOTE — Plan of Care (Signed)

## 2024-01-12 NOTE — TOC Transition Note (Signed)
 Transition of Care [facility]) - Discharge Note   Patient Details  Name: Kathleen Russell MRN: [id number] Date of Birth: 03-26-1955  Transition [facility] CM/SW Contact:  Dalia GORMAN Fuse, RN Phone Number: 01/12/2024, 11:23 AM   Clinical Narrative:    Patient is medically clear to discharge. Plan to dc to home. No TOC needs identified.   Final next level of care: Home/Self Care     Patient Goals and CMS Choice            Discharge Placement                       Discharge Plan and Services Additional resources added to the After Visit Summary for                                       Social Drivers of Health (SDOH) Interventions SDOH Screenings   Food Insecurity: No Food Insecurity (01/10/2024)  Housing: Low Risk (01/10/2024)  Transportation Needs: No Transportation Needs (01/10/2024)  Utilities: Not At Risk (01/10/2024)  Alcohol Screen: Low Risk (11/08/2023)  Depression (PHQ2-9): Low Risk (01/05/2024)  Financial Resource Strain: Low Risk (11/08/2023)  Physical Activity: Insufficiently Active (11/08/2023)  Social Connections: Socially Integrated (01/10/2024)  Stress: No Stress Concern Present (11/08/2023)  Tobacco Use: Low Risk (01/02/2024)  Health Literacy: Adequate Health Literacy (08/19/2023)     Readmission Risk Interventions     No data to display

## 2024-01-13 ENCOUNTER — Telehealth: Payer: Self-pay | Admitting: *Deleted

## 2024-01-13 NOTE — Transitions of Care (Post Inpatient/ED Visit) (Signed)
 "  01/13/2024  Name: Kathleen Russell MRN: [id number] DOB: Jul 03, 1955  Today's TOC FU Call Status: Today's TOC FU Call Status:: Successful TOC FU Call Completed TOC FU Call Complete Date: 01/13/24  Patient's Name and Date of Birth confirmed. Name, DOB  Transition Care Management Follow-up Telephone Call Date of Discharge: 01/11/25 Discharge Facility: [facility]) Type of Discharge: Inpatient Admission Primary Inpatient Discharge Diagnosis:: [facility] SIRS, possible sepsis (systemic inflammatory response syndrome How have you been since you were released from the hospital?: Better Any questions or concerns?: Yes Patient Questions/Concerns:: Keflex  is making patient a little nauseated. She has been trying to eat a little something with it. RN discussed taking her zofran  a few minutes before taking it. Patient Questions/Concerns Addressed: Other:  Items Reviewed: Did you receive and understand the discharge instructions provided?: Yes Medications obtained,verified, and reconciled?: Yes (Medications Reviewed) Any new allergies since your discharge?: No Dietary orders reviewed?: No Do you have support at home?: Yes People in Home [RPT]: spouse Name of Support/Comfort Primary Source: [place]  Medications Reviewed Today: Medications Reviewed Today     Reviewed by Kennieth Cathlean DEL, RN (Case Manager) on 01/13/24 at 1432  Med List Status: <None>   Medication Order Taking? Sig Documenting Provider Last Dose Status Informant  acetaminophen  (TYLENOL ) 500 MG tablet [id number] Yes Take 500 mg by mouth every 8 (eight) hours as needed. [provider]  Active Self, Pharmacy Records           Med Note [phone], ARLEY HERO   Sun Jan 10, 2024 11:54 AM) PRN  apixaban  (ELIQUIS ) 5 MG TABS tablet [id number] Yes Take 2 tablets (10 mg total) by mouth 2 (two) times daily for 7 days, THEN 1 tablet (5 mg total) 2 (two) times daily. Laurita Pillion, MD  Active Self, Pharmacy Records   blood glucose meter kit and supplies [id number] Yes Dispense based on patient and insurance preference. Use up to four times daily as directed. (FOR ICD-10 E10.9, E11.9). Marylynn Verneita CROME, MD  Active Self, Pharmacy Records  cephALEXin  (KEFLEX ) 500 MG capsule [id number] Yes Take 1 capsule (500 mg total) by mouth every 8 (eight) hours for 4 days. Dorinda Drue DASEN, MD  Active   Continuous Glucose Sensor (FREESTYLE LIBRE 3 PLUS SENSOR) [place] [id number] Yes Change sensor every 15 days. Marylynn Verneita CROME, MD  Active Self, Pharmacy Records  dextrose  5 % solution [id number]   Melanee Annah BROCKS, MD  Active   diphenoxylate -atropine  (LOMOTIL ) 2.5-0.025 MG tablet [id number] Yes Take 1 tablet by mouth 4 (four) times daily as needed for diarrhea or loose stools. Melanee Annah BROCKS, MD  Active Self, Pharmacy Records           Med Note [phone], GARY HERO Repress Jan 10, 2024 11:49 AM) PRN  fluticasone  (FLONASE ) 50 MCG/ACT nasal spray [id number] Yes SHAKE LIQUID AND USE 2 SPRAYS IN EACH NOSTRIL AT BEDTIME AS NEEDED Webb, Padonda B, FNP  Active Self, Pharmacy Records           Med Note [name]   Sat Jan 02, 2024  6:41 PM) Uses every night  insulin  glargine (LANTUS  SOLOSTAR) 100 UNIT/ML Solostar Pen [id number] Yes Inject 10 Units into the skin daily. 30 units on steroid days,  10 units all other days Laurita Pillion, MD  Active Self, Pharmacy Records  Insulin  Pen Needle (PEN NEEDLES) 32G X 6 MM MISC [id number] Yes Use to give insulin  Marylynn Verneita CROME, MD  Active Self, Pharmacy Records  JANUVIA  25 MG tablet [id number] Yes TAKE 1 TABLET(25 MG) BY MOUTH DAILY Marylynn Verneita CROME, MD  Active Self, Pharmacy Records  levocetirizine (XYZAL ALLERGY 24HR) 5 MG tablet [id number] Yes Take 5 mg by mouth every evening. [provider]  Active Self, Pharmacy Records  loperamide  (IMODIUM ) 2 MG capsule [id number]  Take 2 tabs by mouth with first loose stool, then 1 tab with each additional loose stool as needed. Do not exceed 8 tabs in a 24-hour period   Patient not taking: Reported on 01/13/2024   Melanee Annah BROCKS, MD  Active Self, Pharmacy Records  magic mouthwash (multi-ingredient) oral suspension [id number] Yes Swish and swallow 5-10 mLs 4 (four) times daily. Melanee Annah BROCKS, MD  Active Self, Pharmacy Records  ondansetron  (ZOFRAN ) 8 MG tablet [id number] Yes Take 1 tablet (8 mg total) by mouth every 8 (eight) hours as needed for nausea or vomiting. Start on the third day after chemotherapy Rao, Archana C, MD  Active Self, Pharmacy Records           Med Note [phone], ARLEY HERO   Sun Jan 10, 2024 11:53 AM) PRN  Pancrelipase , Lip-Prot-Amyl, 24000-76000 units CPEP [id number] Yes Take 1 capsule (24,000 Units total) by mouth 3 (three) times daily before meals. Laurita Pillion, MD  Active Self, Pharmacy Records  pantoprazole  (PROTONIX ) 20 MG tablet [id number] Yes TAKE 1 TABLET(20 MG) BY MOUTH DAILY Rao, Archana C, MD  Active Self, Pharmacy Records  potassium chloride  (KLOR-CON  M) 10 MEQ tablet [id number] Yes Take 1 tablet (10 mEq total) by mouth daily. Melanee Annah BROCKS, MD  Active Self, Pharmacy Records  rosuvastatin  (CRESTOR ) 20 MG tablet [id number] Yes TAKE 1 TABLET(20 MG) BY MOUTH DAILY Marylynn Verneita CROME, MD  Active Self, Pharmacy Records            Home Care and Equipment/Supplies: Were Home Health Services Ordered?: NA Any new equipment or medical supplies ordered?: NA  Functional Questionnaire: Do you need assistance with bathing/showering or dressing?: No Do you need assistance with meal preparation?: Yes Do you need assistance with eating?: No Do you have difficulty maintaining continence: No Do you need assistance with getting out of bed/getting out of a chair/moving?: No Do you have difficulty managing or taking your medications?: No  Follow up appointments reviewed: PCP Follow-up appointment confirmed?: Yes Date of PCP follow-up appointment?: 01/18/24 Follow-up Provider: Verneita Marylynn Specialist [facility] Follow-up appointment confirmed?:  Yes Date of Specialist follow-up appointment?: 01/19/24 Follow-Up Specialty Provider:: Dr Melanee Do you need transportation to your follow-up appointment?: No Do you understand care options if your condition(s) worsen?: Yes-patient verbalized understanding  SDOH Interventions Today    Flowsheet Row Most Recent Value  SDOH Interventions   Food Insecurity Interventions Intervention Not Indicated  Housing Interventions Intervention Not Indicated  Transportation Interventions Intervention Not Indicated, Patient Resources (Friends/Family)  Utilities Interventions Intervention Not Indicated   Discussed and offered 30 day TOC program.  Patient  declined.  The patient has been provided with contact information for the care management team and has been advised to call with any health -related questions or concerns.  The patient verbalized understanding with current plan of care.  The patient is directed to their insurance card regarding availability of benefits coverage  Cathlean Headland BSN RN [facility] Care Management Coordinator [email or web address] Direct Dial: [phone]  Fax: [phone] Website: [email or web address]  "

## 2024-01-14 ENCOUNTER — Telehealth: Payer: Self-pay

## 2024-01-14 ENCOUNTER — Other Ambulatory Visit (INDEPENDENT_AMBULATORY_CARE_PROVIDER_SITE_OTHER)

## 2024-01-14 DIAGNOSIS — E119 Type 2 diabetes mellitus without complications: Secondary | ICD-10-CM

## 2024-01-14 DIAGNOSIS — E785 Hyperlipidemia, unspecified: Secondary | ICD-10-CM

## 2024-01-14 NOTE — Telephone Encounter (Signed)
 Per lab tech they were unable to draw pt today due to pt being dehydrated. They have already released the labs so I have reordered the labs to be done when she returns.

## 2024-01-15 ENCOUNTER — Inpatient Hospital Stay

## 2024-01-15 ENCOUNTER — Inpatient Hospital Stay: Admitting: Oncology

## 2024-01-18 ENCOUNTER — Inpatient Hospital Stay

## 2024-01-18 ENCOUNTER — Encounter: Payer: Self-pay | Admitting: Internal Medicine

## 2024-01-18 ENCOUNTER — Ambulatory Visit: Admitting: Internal Medicine

## 2024-01-18 ENCOUNTER — Other Ambulatory Visit

## 2024-01-18 VITALS — BP 122/58 | HR 127 | Temp 97.9°F | Ht 64.0 in | Wt 166.8 lb

## 2024-01-18 DIAGNOSIS — Z794 Long term (current) use of insulin: Secondary | ICD-10-CM

## 2024-01-18 DIAGNOSIS — C252 Malignant neoplasm of tail of pancreas: Secondary | ICD-10-CM | POA: Diagnosis not present

## 2024-01-18 DIAGNOSIS — E119 Type 2 diabetes mellitus without complications: Secondary | ICD-10-CM | POA: Diagnosis not present

## 2024-01-18 DIAGNOSIS — R6510 Systemic inflammatory response syndrome (SIRS) of non-infectious origin without acute organ dysfunction: Secondary | ICD-10-CM | POA: Diagnosis not present

## 2024-01-18 DIAGNOSIS — I824Z2 Acute embolism and thrombosis of unspecified deep veins of left distal lower extremity: Secondary | ICD-10-CM

## 2024-01-18 DIAGNOSIS — I2693 Single subsegmental pulmonary embolism without acute cor pulmonale: Secondary | ICD-10-CM

## 2024-01-18 DIAGNOSIS — Z09 Encounter for follow-up examination after completed treatment for conditions other than malignant neoplasm: Secondary | ICD-10-CM | POA: Insufficient documentation

## 2024-01-18 DIAGNOSIS — E1165 Type 2 diabetes mellitus with hyperglycemia: Secondary | ICD-10-CM

## 2024-01-18 DIAGNOSIS — R000 Tachycardia, unspecified: Secondary | ICD-10-CM

## 2024-01-18 DIAGNOSIS — E785 Hyperlipidemia, unspecified: Secondary | ICD-10-CM | POA: Diagnosis not present

## 2024-01-18 LAB — CULTURE, BLOOD (ROUTINE X 2)
Culture: NO GROWTH
Culture: NO GROWTH
Special Requests: ADEQUATE

## 2024-01-18 LAB — LIPID PANEL
Cholesterol: 98 mg/dL (ref 28–200)
HDL: 32.2 mg/dL — ABNORMAL LOW
LDL Cholesterol: 36 mg/dL (ref 10–99)
NonHDL: 66.14
Total CHOL/HDL Ratio: 3
Triglycerides: 153 mg/dL — ABNORMAL HIGH (ref 10.0–149.0)
VLDL: 30.6 mg/dL (ref 0.0–40.0)

## 2024-01-18 LAB — HEMOGLOBIN A1C: Hgb A1c MFr Bld: 7.7 % — ABNORMAL HIGH (ref 4.6–6.5)

## 2024-01-18 LAB — LDL CHOLESTEROL, DIRECT: Direct LDL: 43 mg/dL

## 2024-01-18 LAB — COMPREHENSIVE METABOLIC PANEL WITH GFR
ALT: 17 U/L (ref 3–35)
AST: 33 U/L (ref 5–37)
Albumin: 3.3 g/dL — ABNORMAL LOW (ref 3.5–5.2)
Alkaline Phosphatase: 97 U/L (ref 39–117)
BUN: 10 mg/dL (ref 6–23)
CO2: 25 meq/L (ref 19–32)
Calcium: 8.3 mg/dL — ABNORMAL LOW (ref 8.4–10.5)
Chloride: 98 meq/L (ref 96–112)
Creatinine, Ser: 0.76 mg/dL (ref 0.40–1.20)
GFR: 79.9 mL/min
Glucose, Bld: 154 mg/dL — ABNORMAL HIGH (ref 70–99)
Potassium: 3.3 meq/L — ABNORMAL LOW (ref 3.5–5.1)
Sodium: 136 meq/L (ref 135–145)
Total Bilirubin: 0.6 mg/dL (ref 0.2–1.2)
Total Protein: 6.8 g/dL (ref 6.0–8.3)

## 2024-01-18 MED ORDER — METOPROLOL SUCCINATE ER 25 MG PO TB24: 25.0000 mg | ORAL_TABLET | Freq: Every day | ORAL | 3 refills | Status: AC

## 2024-01-18 MED ORDER — ONDANSETRON HCL 8 MG PO TABS: 8.0000 mg | ORAL_TABLET | Freq: Three times a day (TID) | ORAL | 5 refills | Status: DC | PRN

## 2024-01-18 NOTE — Assessment & Plan Note (Addendum)
 Without rigth heart strain.  Secondary to large left leg DVT in the settinf of active pancreatic CA. Continue lifelong anticoagulation

## 2024-01-18 NOTE — Assessment & Plan Note (Signed)
 Extensive  involving all venous return. Her posterior thigh pain  was addressed with ice

## 2024-01-18 NOTE — Assessment & Plan Note (Signed)
 I have downloaded and reviewed the data from patient's continuous blood glucose monitor  for  the period  of    Dec 30 to jan 12  and recommended that lantus  continue to be suspended until her appetite improves and her average glucose is 160 or higher

## 2024-01-18 NOTE — Patient Instructions (Signed)
 Your EKG is normal except for a rapid heat rate.  This is because you are dehydrated,  and needs to be addressed ASAP  with more fluids.  The metoprolol  should be started if your resting pulse is not below 110 by tonight.

## 2024-01-18 NOTE — Assessment & Plan Note (Signed)
 Etiology not found.  All symptoms resolving except tachycardia, now attrbuted to continued poor po intake

## 2024-01-18 NOTE — Assessment & Plan Note (Signed)
 Patient is stable post discharge and has no new issues or questions about discharge plans at the visit today for hospital follow up.  I have reviewed the records from the hospital admission in detail with patient today. Medication reconciliation was done .

## 2024-01-18 NOTE — Addendum Note (Signed)
 Addended by: HARRIETTE RAISIN on: 01/18/2024 11:08 AM   Modules accepted: Orders

## 2024-01-18 NOTE — Progress Notes (Signed)
 "  Subjective:  Patient ID: Kathleen Russell, female    DOB: 02/26/1955  Age: 69 y.o. MRN: [id number]  CC: The primary encounter diagnosis was Tachycardia. Diagnoses of Malignant neoplasm of tail of pancreas (HCC), Single subsegmental pulmonary embolism without acute cor pulmonale (HCC), SIRS, possible sepsis (systemic inflammatory response syndrome) (HCC), Acute deep vein thrombosis (DVT) of distal vein of left lower extremity (HCC), and Hospital discharge follow-up were also pertinent to this visit.   HPI Kathleen Russell presents for  Chief Complaint  Patient presents with   medical managment of chronic issues    Hospital follow up    Kathleen Russell  IS A 68 YR OLD FEMALE WITH PANCREATIC CA,type 2 DM who was hospitalized on Dec 27  with acute PE and extensive left lower extremity DVT,.  Symptoms were present for 6 days before she was evaluated    no signs of cor pulmonale by CT angiogram and ECHO  She was discharged on Eliquis  on 01/04/2024 which she states seh has been taking as directed.  She was readmitted on Jan 3 for presumed  sepsis after presenting with a fever. She states THAT since her discharge on 12/29 she had been having low-grade fevers and progressive hypersomnolence.  however in the past 24 hours she started sleeping a lot and was more febrile with a temp of 101. She denied cough or shortness of breath, vomiting or abdominal pain or dysuria. She died report  diarrhea, which she attributed to chemo;  this was addressed with addition of  Creon . Denies pain or redness in her left leg where she had the DVT.   Respiratory  panel was NEGATIVE was sent home on cephalexin   for presumed UTI.    Since she was discharged she has had a difficult time staying hydrated due to recurrent  nausea and anorexia,   oral intake has been minimal.  Attempts at phlebotomy last Friday were stopped after 4 attempts .  Successful today. not eating mu h .     Medication reconciliation was done today during visit    She continues to left posterior thigh pain since the DVT described as a cramp:. The DVT was not diagnosed for 8 day  Type 2 DM: using Freestyle Libre   no lantus  since dischage   due to reduce oral intake.    Outpatient Medications Prior to Visit  Medication Sig Dispense Refill   acetaminophen  (TYLENOL ) 500 MG tablet Take 500 mg by mouth every 8 (eight) hours as needed.     apixaban  (ELIQUIS ) 5 MG TABS tablet Take 2 tablets (10 mg total) by mouth 2 (two) times daily for 7 days, THEN 1 tablet (5 mg total) 2 (two) times daily. 120 tablet 0   blood glucose meter kit and supplies Dispense based on patient and insurance preference. Use up to four times daily as directed. (FOR ICD-10 E10.9, E11.9). 1 each 0   Continuous Glucose Sensor (FREESTYLE LIBRE 3 PLUS SENSOR) MISC Change sensor every 15 days. 2 each 2   diphenoxylate -atropine  (LOMOTIL ) 2.5-0.025 MG tablet Take 1 tablet by mouth 4 (four) times daily as needed for diarrhea or loose stools. 30 tablet 2   fluticasone  (FLONASE ) 50 MCG/ACT nasal spray SHAKE LIQUID AND USE 2 SPRAYS IN EACH NOSTRIL AT BEDTIME AS NEEDED 16 g 5   insulin  glargine (LANTUS  SOLOSTAR) 100 UNIT/ML Solostar Pen Inject 10 Units into the skin daily. 30 units on steroid days,  10 units all other days     Insulin   Pen Needle (PEN NEEDLES) 32G X 6 MM MISC Use to give insulin  100 each 2   JANUVIA  25 MG tablet TAKE 1 TABLET(25 MG) BY MOUTH DAILY 90 tablet 1   levocetirizine (XYZAL ALLERGY 24HR) 5 MG tablet Take 5 mg by mouth every evening.     loperamide  (IMODIUM ) 2 MG capsule Take 2 tabs by mouth with first loose stool, then 1 tab with each additional loose stool as needed. Do not exceed 8 tabs in a 24-hour period 60 capsule 3   magic mouthwash (multi-ingredient) oral suspension Swish and swallow 5-10 mLs 4 (four) times daily. 480 mL 3   Pancrelipase , Lip-Prot-Amyl, 24000-76000 units CPEP Take 1 capsule (24,000 Units total) by mouth 3 (three) times daily before meals. 300 capsule  0   pantoprazole  (PROTONIX ) 20 MG tablet TAKE 1 TABLET(20 MG) BY MOUTH DAILY 90 tablet 0   potassium chloride  (KLOR-CON  M) 10 MEQ tablet Take 1 tablet (10 mEq total) by mouth daily. 90 tablet 0   rosuvastatin  (CRESTOR ) 20 MG tablet TAKE 1 TABLET(20 MG) BY MOUTH DAILY 90 tablet 1   ondansetron  (ZOFRAN ) 8 MG tablet Take 1 tablet (8 mg total) by mouth every 8 (eight) hours as needed for nausea or vomiting. Start on the third day after chemotherapy 30 tablet 1   Facility-Administered Medications Prior to Visit  Medication Dose Route Frequency Provider Last Rate Last Admin   dextrose  5 % solution   Intravenous Continuous Melanee Annah BROCKS, MD 10 mL/hr at 05/08/23 0909 New Bag at 05/08/23 0909    Review of Systems;  Patient denies headache, fevers, malaise, unintentional weight loss, skin rash, eye pain, sinus congestion and sinus pain, sore throat, dysphagia,  hemoptysis , cough, dyspnea, wheezing, chest pain, palpitations, orthopnea, edema, abdominal pain, nausea, melena, diarrhea, constipation, flank pain, dysuria, hematuria, urinary  Frequency, nocturia, numbness, tingling, seizures,  Focal weakness, Loss of consciousness,  Tremor, insomnia, depression, anxiety, and suicidal ideation.      Objective:  BP (!) 122/58 (BP Location: Left Arm)   Pulse (!) 127   Temp 97.9 F (36.6 C)   Ht 5' 4 (1.626 m)   Wt 166 lb 12.8 oz (75.7 kg)   SpO2 97%   BMI 28.63 kg/m   BP Readings from Last 3 Encounters:  01/18/24 (!) 122/58  01/12/24 (!) 113/101  01/04/24 131/69    Wt Readings from Last 3 Encounters:  01/18/24 166 lb 12.8 oz (75.7 kg)  01/10/24 164 lb 14.5 oz (74.8 kg)  01/02/24 169 lb 15.6 oz (77.1 kg)    Physical Exam Vitals reviewed.  Constitutional:      General: She is not in acute distress.    Appearance: Normal appearance. She is normal weight. She is not ill-appearing, toxic-appearing or diaphoretic.  HENT:     Head: Normocephalic.  Eyes:     General: No scleral icterus.        Right eye: No discharge.        Left eye: No discharge.     Conjunctiva/sclera: Conjunctivae normal.  Cardiovascular:     Rate and Rhythm: Regular rhythm. Tachycardia present.     Heart sounds: Normal heart sounds.  Pulmonary:     Effort: Pulmonary effort is normal. No respiratory distress.     Breath sounds: Normal breath sounds.  Musculoskeletal:        General: Normal range of motion.  Skin:    General: Skin is warm and dry.  Neurological:     General: No focal deficit  present.     Mental Status: She is alert and oriented to person, place, and time. Mental status is at baseline.  Psychiatric:        Mood and Affect: Mood normal.        Behavior: Behavior normal.        Thought Content: Thought content normal.        Judgment: Judgment normal.     Lab Results  Component Value Date   HGBA1C 7.7 (H) 01/18/2024   HGBA1C 8.8 (H) 10/09/2023   HGBA1C 7.8 (H) 07/06/2023    Lab Results  Component Value Date   CREATININE 0.76 01/18/2024   CREATININE 0.50 01/12/2024   CREATININE 0.49 01/11/2024    Lab Results  Component Value Date   WBC 10.4 01/12/2024   HGB 8.9 (L) 01/12/2024   HCT 28.1 (L) 01/12/2024   PLT 150 01/12/2024   GLUCOSE 154 (H) 01/18/2024   CHOL 98 01/18/2024   TRIG 153.0 (H) 01/18/2024   HDL 32.20 (L) 01/18/2024   LDLDIRECT 43.0 01/18/2024   LDLCALC 36 01/18/2024   ALT 17 01/18/2024   AST 33 01/18/2024   NA 136 01/18/2024   K 3.3 (L) 01/18/2024   CL 98 01/18/2024   CREATININE 0.76 01/18/2024   BUN 10 01/18/2024   CO2 25 01/18/2024   TSH 2.506 06/18/2023   INR 1.4 (H) 01/09/2024   HGBA1C 7.7 (H) 01/18/2024   MICROALBUR <0.7 03/10/2023    DG Chest Port 1 View Result Date: 01/09/2024 EXAM: 1 VIEW(S) XRAY OF THE CHEST 01/09/2024 09:44:00 PM COMPARISON: None available. CLINICAL HISTORY: Questionable sepsis - evaluate for abnormality. Fever. History of stage 4 pancreatic cancer with liver metastases. FINDINGS: LINES, TUBES AND DEVICES: Power port type  central venous catheter with tip at the cavoatrial junction region. LUNGS AND PLEURA: Shallow inspiration. Lungs are clear. No pleural effusion or pneumothorax. HEART AND MEDIASTINUM: Heart size and pulmonary vascularity are normal. Mediastinal contours appear intact. Calcification of the aorta. BONES AND SOFT TISSUES: No acute osseous abnormality. IMPRESSION: 1. No acute cardiopulmonary abnormality. 2. Central venous catheter tip projects at the cavoatrial junction. Electronically signed by: Elsie Gravely MD 01/09/2024 09:50 PM EST RP Workstation: [id number]    Assessment & Plan:  .Tachycardia -     EKG 12-Lead  Malignant neoplasm of tail of pancreas (HCC) -     Ondansetron  HCl; Take 1 tablet (8 mg total) by mouth every 8 (eight) hours as needed for nausea or vomiting.  Dispense: 30 tablet; Refill: 5  Single subsegmental pulmonary embolism without acute cor pulmonale (HCC) Assessment & Plan: Without rigth heart strain.  Secondary to large left leg DVT in the settinf of active pancreatic CA. Continue lifelong anticoagulation    SIRS, possible sepsis (systemic inflammatory response syndrome) (HCC) Assessment & Plan: Etiology not found.  All symptoms resolving except tachycardia, now attrbuted to continued poor po intake   Acute deep vein thrombosis (DVT) of distal vein of left lower extremity (HCC) Assessment & Plan: Extensive  involving all venous return. Her posterior thigh pain  was addressed with ice    Hospital discharge follow-up Assessment & Plan: Patient is stable post discharge and has no new issues or questions about discharge plans at the visit today for hospital follow up.  I have reviewed the records from the hospital admission in detail with patient today. Medication reconciliation was done .   Other orders -     Metoprolol  Succinate ER; Take 1 tablet (25 mg total) by mouth daily.  Dispense: 90 tablet; Refill: 3     I spent 34 minutes on the day of this face to face  encounter reviewing patient's  most recent visit with cardiology,  nephrology,  and neurology,  prior relevant surgical and non surgical procedures, recent  labs and imaging studies, counseling on weight management,  reviewing the assessment and plan with patient, and post visit ordering and reviewing of  diagnostics and therapeutics with patient  .   Follow-up: Return in about 3 months (around 04/17/2024) for follow up diabetes.   Kathleen LITTIE Kettering, MD "

## 2024-01-19 ENCOUNTER — Other Ambulatory Visit (HOSPITAL_COMMUNITY): Payer: Self-pay

## 2024-01-19 ENCOUNTER — Inpatient Hospital Stay

## 2024-01-19 ENCOUNTER — Other Ambulatory Visit: Payer: Self-pay

## 2024-01-19 ENCOUNTER — Telehealth: Payer: Self-pay

## 2024-01-19 ENCOUNTER — Encounter: Payer: Self-pay | Admitting: Oncology

## 2024-01-19 ENCOUNTER — Inpatient Hospital Stay: Admitting: Oncology

## 2024-01-19 ENCOUNTER — Telehealth: Payer: Self-pay | Admitting: Oncology

## 2024-01-19 ENCOUNTER — Other Ambulatory Visit: Payer: Self-pay | Admitting: Oncology

## 2024-01-19 DIAGNOSIS — C252 Malignant neoplasm of tail of pancreas: Secondary | ICD-10-CM

## 2024-01-19 MED ORDER — MAGIC MOUTHWASH W/LIDOCAINE: 5.0000 mL | Freq: Four times a day (QID) | ORAL | Status: AC | PRN

## 2024-01-19 MED ORDER — MAGIC MOUTHWASH W/LIDOCAINE: 5.0000 mL | Freq: Four times a day (QID) | ORAL | 0 refills | Status: DC | PRN

## 2024-01-19 MED ORDER — STERILE WATER FOR INJECTION IJ SOLN
5.0000 mL | Freq: Four times a day (QID) | OROMUCOSAL | 3 refills | Status: AC
  Filled 2024-01-19: qty 480, 12d supply, fill #0

## 2024-01-19 NOTE — Addendum Note (Signed)
 Addended by: Casidee Jann on: 01/19/2024 04:37 PM   Modules accepted: Orders

## 2024-01-19 NOTE — Addendum Note (Signed)
 Addended by: MARYLYNN VERNEITA CROME on: 01/19/2024 12:10 PM   Modules accepted: Level of Service

## 2024-01-19 NOTE — Telephone Encounter (Signed)
 Pt husband called and left a vm stating that the patient doesn't feel well and that the pt would like to cancel her appts and would like to talk to Dr. Melanee. Please advise.

## 2024-01-19 NOTE — Telephone Encounter (Signed)
 PA for Zofran  needed

## 2024-01-19 NOTE — Telephone Encounter (Signed)
 Outbound call; spoke to spouse who says ever since being hospitalized w/ blood clot, hasn't been eating much at all 400-500 calories, sleeping >14 hrs / day, not drinking enough.  Saw Dr. Donato on Friday, port was clogged but after 2 hours they were able to draw labs.  Patient was told she needed to heal for a couple of weeks before doing any treatment. Hasn't thrown up today and has had the best day since being released.  Patient would like to speak with Dr. Melanee about the medicines she's on and postponing treatment a little longer until she recooperates more.  She has zero energy and can barely walk.  Dr. Marylynn discussed palliative care and patient expressed interest in this.  Touched base with Josh and if patient needed supportive treatment we can provide labs, [facility] and possible IVF.     Touched base with Dr. Melanee she will contact the patient directly later today.

## 2024-01-19 NOTE — Telephone Encounter (Signed)
 Pharmacy Patient Advocate Encounter   Received notification from Physician's Office that prior authorization for Ondansetron  8MG  dispersible tablets is required/requested.   Insurance verification completed.   The patient is insured through [facility] ADVANTAGE/RX ADVANCE.   Per test claim: PA required; PA started via CoverMyMeds. KEY [id number] . Waiting for clinical questions to populate.

## 2024-01-19 NOTE — Telephone Encounter (Signed)
 Per Dr. Melanee send script for magic mouthwash for patient.  Attempted to send to [facility] but format changed to print.  Script faxed to [facility] pharmacy. Dr. Melanee asked if possible for patient to be seen by Mr. Sherleen tomorrow for nausea and possible thrush; will touch base with Josh first thing in the morning.

## 2024-01-19 NOTE — Telephone Encounter (Signed)
 noted

## 2024-01-20 ENCOUNTER — Encounter: Payer: Self-pay | Admitting: Hospice and Palliative Medicine

## 2024-01-20 ENCOUNTER — Ambulatory Visit: Payer: Self-pay | Admitting: Internal Medicine

## 2024-01-20 ENCOUNTER — Other Ambulatory Visit (HOSPITAL_COMMUNITY): Payer: Self-pay

## 2024-01-20 ENCOUNTER — Other Ambulatory Visit: Payer: Self-pay

## 2024-01-20 ENCOUNTER — Inpatient Hospital Stay

## 2024-01-20 ENCOUNTER — Other Ambulatory Visit: Payer: Self-pay | Admitting: Oncology

## 2024-01-20 ENCOUNTER — Inpatient Hospital Stay: Attending: Oncology | Admitting: Hospice and Palliative Medicine

## 2024-01-20 VITALS — BP 107/48 | HR 120 | Temp 97.0°F | Resp 18 | Wt 166.0 lb

## 2024-01-20 VITALS — BP 117/48 | HR 102

## 2024-01-20 DIAGNOSIS — C252 Malignant neoplasm of tail of pancreas: Secondary | ICD-10-CM | POA: Diagnosis not present

## 2024-01-20 DIAGNOSIS — T451X5A Adverse effect of antineoplastic and immunosuppressive drugs, initial encounter: Secondary | ICD-10-CM

## 2024-01-20 DIAGNOSIS — E876 Hypokalemia: Secondary | ICD-10-CM

## 2024-01-20 DIAGNOSIS — N898 Other specified noninflammatory disorders of vagina: Secondary | ICD-10-CM

## 2024-01-20 DIAGNOSIS — C259 Malignant neoplasm of pancreas, unspecified: Secondary | ICD-10-CM

## 2024-01-20 DIAGNOSIS — R319 Hematuria, unspecified: Secondary | ICD-10-CM

## 2024-01-20 DIAGNOSIS — E860 Dehydration: Secondary | ICD-10-CM

## 2024-01-20 DIAGNOSIS — D6959 Other secondary thrombocytopenia: Secondary | ICD-10-CM

## 2024-01-20 LAB — CBC WITH DIFFERENTIAL (CANCER CENTER ONLY)
Abs Immature Granulocytes: 0.12 K/uL — ABNORMAL HIGH (ref 0.00–0.07)
Basophils Absolute: 0.1 K/uL (ref 0.0–0.1)
Basophils Relative: 0 %
Eosinophils Absolute: 0.4 K/uL (ref 0.0–0.5)
Eosinophils Relative: 2 %
HCT: 27.8 % — ABNORMAL LOW (ref 36.0–46.0)
Hemoglobin: 8.8 g/dL — ABNORMAL LOW (ref 12.0–15.0)
Immature Granulocytes: 1 %
Lymphocytes Relative: 9 %
Lymphs Abs: 1.4 K/uL (ref 0.7–4.0)
MCH: 28 pg (ref 26.0–34.0)
MCHC: 31.7 g/dL (ref 30.0–36.0)
MCV: 88.5 fL (ref 80.0–100.0)
Monocytes Absolute: 1 K/uL (ref 0.1–1.0)
Monocytes Relative: 6 %
Neutro Abs: 12.1 K/uL — ABNORMAL HIGH (ref 1.7–7.7)
Neutrophils Relative %: 82 %
Platelet Count: 44 K/uL — ABNORMAL LOW (ref 150–400)
RBC: 3.14 MIL/uL — ABNORMAL LOW (ref 3.87–5.11)
RDW: 14.6 % (ref 11.5–15.5)
WBC Count: 15 K/uL — ABNORMAL HIGH (ref 4.0–10.5)
nRBC: 0 % (ref 0.0–0.2)

## 2024-01-20 LAB — URINALYSIS, COMPLETE (UACMP) WITH MICROSCOPIC
Bilirubin Urine: NEGATIVE
Glucose, UA: NEGATIVE mg/dL
Ketones, ur: NEGATIVE mg/dL
Leukocytes,Ua: NEGATIVE
Nitrite: NEGATIVE
Protein, ur: 100 mg/dL — AB
RBC / HPF: >50 RBC/hpf (ref 0–5)
Specific Gravity, Urine: 1.025 (ref 1.005–1.030)
pH: 5 (ref 5.0–8.0)

## 2024-01-20 LAB — CMP (CANCER CENTER ONLY)
ALT: 23 U/L (ref 0–44)
AST: 57 U/L — ABNORMAL HIGH (ref 15–41)
Albumin: 3.6 g/dL (ref 3.5–5.0)
Alkaline Phosphatase: 129 U/L — ABNORMAL HIGH (ref 38–126)
Anion gap: 17 — ABNORMAL HIGH (ref 5–15)
BUN: 9 mg/dL (ref 8–23)
CO2: 20 mmol/L — ABNORMAL LOW (ref 22–32)
Calcium: 9 mg/dL (ref 8.9–10.3)
Chloride: 100 mmol/L (ref 98–111)
Creatinine: 0.84 mg/dL (ref 0.44–1.00)
GFR, Estimated: >60 mL/min
Glucose, Bld: 163 mg/dL — ABNORMAL HIGH (ref 70–99)
Potassium: 4.1 mmol/L (ref 3.5–5.1)
Sodium: 137 mmol/L (ref 135–145)
Total Bilirubin: 0.5 mg/dL (ref 0.0–1.2)
Total Protein: 7.8 g/dL (ref 6.5–8.1)

## 2024-01-20 LAB — ABO/RH: ABO/RH(D): A POS

## 2024-01-20 MED ORDER — AMOXICILLIN-POT CLAVULANATE 875-125 MG PO TABS
1.0000 | ORAL_TABLET | Freq: Two times a day (BID) | ORAL | 0 refills | Status: DC
  Filled 2024-01-20: qty 14, 7d supply, fill #0

## 2024-01-20 MED ORDER — SODIUM CHLORIDE 0.9 % IV SOLN
Freq: Once | INTRAVENOUS | Status: AC
  Filled 2024-01-20: qty 250

## 2024-01-20 MED ORDER — POTASSIUM CHLORIDE CRYS ER 10 MEQ PO TBCR: 10.0000 meq | EXTENDED_RELEASE_TABLET | Freq: Two times a day (BID) | ORAL | 0 refills | Status: DC

## 2024-01-20 MED ORDER — AMOXICILLIN-POT CLAVULANATE 875-125 MG PO TABS: 1.0000 | ORAL_TABLET | Freq: Two times a day (BID) | ORAL | 0 refills | Status: DC

## 2024-01-20 NOTE — Patient Instructions (Signed)
 Pt requested augmentin  be sent to [facility] community pharmacy.  RN called prescription into chosen pharmacy and CMA.  called and cancelled electronic order to [facility] in [place] per patient request.

## 2024-01-20 NOTE — Addendum Note (Signed)
 Addended by: Lyra Alaimo on: 01/20/2024 09:55 AM   Modules accepted: Orders

## 2024-01-20 NOTE — Progress Notes (Signed)
 Pt reports having nausea and thrush in mouth and throat.  Pt also reports decreased oral and fluids intake but states was better yesterday. Dr Melanee sent in rx for magic mouthwash yesterday which helped patient. Reports dark urine. Pt states she has no energy, reports was hospitalized twice the end of December.
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
## 2024-01-20 NOTE — Addendum Note (Signed)
 Addended by: JASMINE DELON POUR on: 01/20/2024 04:10 PM   Modules accepted: Orders

## 2024-01-21 ENCOUNTER — Inpatient Hospital Stay

## 2024-01-21 LAB — URINE CULTURE: Culture: <10000 — AB

## 2024-01-23 ENCOUNTER — Encounter: Payer: Self-pay | Admitting: Internal Medicine

## 2024-01-25 ENCOUNTER — Other Ambulatory Visit: Payer: Self-pay

## 2024-01-25 ENCOUNTER — Emergency Department

## 2024-01-25 ENCOUNTER — Inpatient Hospital Stay
Admission: EM | Admit: 2024-01-25 | Discharge: 2024-01-28 | DRG: 813 | Disposition: A | Discharge status: Home / Self Care | Attending: Internal Medicine | Admitting: Internal Medicine

## 2024-01-25 ENCOUNTER — Inpatient Hospital Stay

## 2024-01-25 ENCOUNTER — Encounter: Payer: Self-pay | Admitting: Hospice and Palliative Medicine

## 2024-01-25 ENCOUNTER — Inpatient Hospital Stay: Admitting: Hospice and Palliative Medicine

## 2024-01-25 ENCOUNTER — Other Ambulatory Visit: Payer: Self-pay | Admitting: Lab

## 2024-01-25 ENCOUNTER — Encounter: Payer: Self-pay | Admitting: Oncology

## 2024-01-25 VITALS — BP 110/49 | HR 113 | Temp 97.8°F | Resp 19 | Wt 167.0 lb

## 2024-01-25 DIAGNOSIS — C259 Malignant neoplasm of pancreas, unspecified: Secondary | ICD-10-CM

## 2024-01-25 DIAGNOSIS — C252 Malignant neoplasm of tail of pancreas: Secondary | ICD-10-CM | POA: Diagnosis present

## 2024-01-25 DIAGNOSIS — I5032 Chronic diastolic (congestive) heart failure: Secondary | ICD-10-CM | POA: Diagnosis present

## 2024-01-25 DIAGNOSIS — Z833 Family history of diabetes mellitus: Secondary | ICD-10-CM | POA: Diagnosis not present

## 2024-01-25 DIAGNOSIS — Z515 Encounter for palliative care: Secondary | ICD-10-CM | POA: Diagnosis not present

## 2024-01-25 DIAGNOSIS — D6959 Other secondary thrombocytopenia: Secondary | ICD-10-CM

## 2024-01-25 DIAGNOSIS — Z86718 Personal history of other venous thrombosis and embolism: Secondary | ICD-10-CM

## 2024-01-25 DIAGNOSIS — Z91040 Latex allergy status: Secondary | ICD-10-CM | POA: Diagnosis not present

## 2024-01-25 DIAGNOSIS — D696 Thrombocytopenia, unspecified: Secondary | ICD-10-CM | POA: Diagnosis not present

## 2024-01-25 DIAGNOSIS — D61818 Other pancytopenia: Secondary | ICD-10-CM | POA: Diagnosis present

## 2024-01-25 DIAGNOSIS — C787 Secondary malignant neoplasm of liver and intrahepatic bile duct: Secondary | ICD-10-CM | POA: Diagnosis present

## 2024-01-25 DIAGNOSIS — Z9049 Acquired absence of other specified parts of digestive tract: Secondary | ICD-10-CM

## 2024-01-25 DIAGNOSIS — D735 Infarction of spleen: Secondary | ICD-10-CM | POA: Diagnosis present

## 2024-01-25 DIAGNOSIS — I2699 Other pulmonary embolism without acute cor pulmonale: Secondary | ICD-10-CM | POA: Diagnosis not present

## 2024-01-25 DIAGNOSIS — Z85828 Personal history of other malignant neoplasm of skin: Secondary | ICD-10-CM

## 2024-01-25 DIAGNOSIS — E119 Type 2 diabetes mellitus without complications: Secondary | ICD-10-CM | POA: Diagnosis present

## 2024-01-25 DIAGNOSIS — E785 Hyperlipidemia, unspecified: Secondary | ICD-10-CM | POA: Diagnosis present

## 2024-01-25 DIAGNOSIS — Z8616 Personal history of COVID-19: Secondary | ICD-10-CM | POA: Diagnosis not present

## 2024-01-25 DIAGNOSIS — Z794 Long term (current) use of insulin: Secondary | ICD-10-CM

## 2024-01-25 DIAGNOSIS — R040 Epistaxis: Secondary | ICD-10-CM | POA: Diagnosis not present

## 2024-01-25 DIAGNOSIS — Z7901 Long term (current) use of anticoagulants: Secondary | ICD-10-CM | POA: Diagnosis not present

## 2024-01-25 DIAGNOSIS — N3000 Acute cystitis without hematuria: Secondary | ICD-10-CM | POA: Diagnosis not present

## 2024-01-25 DIAGNOSIS — Z9221 Personal history of antineoplastic chemotherapy: Secondary | ICD-10-CM | POA: Diagnosis not present

## 2024-01-25 DIAGNOSIS — K529 Noninfective gastroenteritis and colitis, unspecified: Secondary | ICD-10-CM | POA: Diagnosis present

## 2024-01-25 DIAGNOSIS — D649 Anemia, unspecified: Secondary | ICD-10-CM | POA: Diagnosis not present

## 2024-01-25 DIAGNOSIS — E871 Hypo-osmolality and hyponatremia: Secondary | ICD-10-CM | POA: Diagnosis present

## 2024-01-25 DIAGNOSIS — Z90710 Acquired absence of both cervix and uterus: Secondary | ICD-10-CM

## 2024-01-25 DIAGNOSIS — I82402 Acute embolism and thrombosis of unspecified deep veins of left lower extremity: Secondary | ICD-10-CM | POA: Diagnosis present

## 2024-01-25 DIAGNOSIS — Z7984 Long term (current) use of oral hypoglycemic drugs: Secondary | ICD-10-CM

## 2024-01-25 DIAGNOSIS — Z66 Do not resuscitate: Secondary | ICD-10-CM | POA: Diagnosis present

## 2024-01-25 DIAGNOSIS — I82409 Acute embolism and thrombosis of unspecified deep veins of unspecified lower extremity: Secondary | ICD-10-CM | POA: Diagnosis not present

## 2024-01-25 DIAGNOSIS — Z90722 Acquired absence of ovaries, bilateral: Secondary | ICD-10-CM

## 2024-01-25 DIAGNOSIS — Z9079 Acquired absence of other genital organ(s): Secondary | ICD-10-CM

## 2024-01-25 DIAGNOSIS — Z8249 Family history of ischemic heart disease and other diseases of the circulatory system: Secondary | ICD-10-CM

## 2024-01-25 DIAGNOSIS — J309 Allergic rhinitis, unspecified: Secondary | ICD-10-CM | POA: Diagnosis present

## 2024-01-25 DIAGNOSIS — D72829 Elevated white blood cell count, unspecified: Secondary | ICD-10-CM | POA: Diagnosis present

## 2024-01-25 DIAGNOSIS — N390 Urinary tract infection, site not specified: Secondary | ICD-10-CM | POA: Insufficient documentation

## 2024-01-25 DIAGNOSIS — Z79899 Other long term (current) drug therapy: Secondary | ICD-10-CM

## 2024-01-25 DIAGNOSIS — D6481 Anemia due to antineoplastic chemotherapy: Secondary | ICD-10-CM | POA: Diagnosis not present

## 2024-01-25 DIAGNOSIS — Z86711 Personal history of pulmonary embolism: Secondary | ICD-10-CM

## 2024-01-25 DIAGNOSIS — D693 Immune thrombocytopenic purpura: Secondary | ICD-10-CM | POA: Diagnosis present

## 2024-01-25 LAB — CBC WITH DIFFERENTIAL/PLATELET
Abs Immature Granulocytes: 0.05 K/uL (ref 0.00–0.07)
Basophils Absolute: 0 K/uL (ref 0.0–0.1)
Basophils Relative: 0 %
Eosinophils Absolute: 0 K/uL (ref 0.0–0.5)
Eosinophils Relative: 0 %
HCT: 22.4 % — ABNORMAL LOW (ref 36.0–46.0)
Hemoglobin: 7 g/dL — ABNORMAL LOW (ref 12.0–15.0)
Immature Granulocytes: 1 %
Lymphocytes Relative: 5 %
Lymphs Abs: 0.5 K/uL — ABNORMAL LOW (ref 0.7–4.0)
MCH: 27.3 pg (ref 26.0–34.0)
MCHC: 31.3 g/dL (ref 30.0–36.0)
MCV: 87.5 fL (ref 80.0–100.0)
Monocytes Absolute: 0.1 K/uL (ref 0.1–1.0)
Monocytes Relative: 1 %
Neutro Abs: 8 K/uL — ABNORMAL HIGH (ref 1.7–7.7)
Neutrophils Relative %: 93 %
Platelets: 27 K/uL — CL (ref 150–400)
RBC: 2.56 MIL/uL — ABNORMAL LOW (ref 3.87–5.11)
RDW: 15.1 % (ref 11.5–15.5)
WBC: 8.6 K/uL (ref 4.0–10.5)
nRBC: 0 % (ref 0.0–0.2)

## 2024-01-25 LAB — CBC WITH DIFFERENTIAL (CANCER CENTER ONLY)
Abs Immature Granulocytes: 0.27 K/uL — ABNORMAL HIGH (ref 0.00–0.07)
Basophils Absolute: 0 K/uL (ref 0.0–0.1)
Basophils Relative: 0 %
Eosinophils Absolute: 0.4 K/uL (ref 0.0–0.5)
Eosinophils Relative: 3 %
HCT: 23.8 % — ABNORMAL LOW (ref 36.0–46.0)
Hemoglobin: 7.5 g/dL — ABNORMAL LOW (ref 12.0–15.0)
Immature Granulocytes: 2 %
Lymphocytes Relative: 8 %
Lymphs Abs: 1.1 K/uL (ref 0.7–4.0)
MCH: 27.8 pg (ref 26.0–34.0)
MCHC: 31.5 g/dL (ref 30.0–36.0)
MCV: 88.1 fL (ref 80.0–100.0)
Monocytes Absolute: 1 K/uL (ref 0.1–1.0)
Monocytes Relative: 7 %
Neutro Abs: 11.5 K/uL — ABNORMAL HIGH (ref 1.7–7.7)
Neutrophils Relative %: 80 %
Platelet Count: 9 K/uL — CL (ref 150–400)
RBC: 2.7 MIL/uL — ABNORMAL LOW (ref 3.87–5.11)
RDW: 15.1 % (ref 11.5–15.5)
WBC Count: 14.2 K/uL — ABNORMAL HIGH (ref 4.0–10.5)
nRBC: 0 % (ref 0.0–0.2)

## 2024-01-25 LAB — LACTATE DEHYDROGENASE: LDH: 679 U/L — ABNORMAL HIGH (ref 105–235)

## 2024-01-25 LAB — CMP (CANCER CENTER ONLY)
ALT: 14 U/L (ref 0–44)
AST: 42 U/L — ABNORMAL HIGH (ref 15–41)
Albumin: 2.7 g/dL — ABNORMAL LOW (ref 3.5–5.0)
Alkaline Phosphatase: 102 U/L (ref 38–126)
Anion gap: 10 (ref 5–15)
BUN: 15 mg/dL (ref 8–23)
CO2: 22 mmol/L (ref 22–32)
Calcium: 8 mg/dL — ABNORMAL LOW (ref 8.9–10.3)
Chloride: 98 mmol/L (ref 98–111)
Creatinine: 1.2 mg/dL — ABNORMAL HIGH (ref 0.44–1.00)
GFR, Estimated: 49 mL/min — ABNORMAL LOW
Glucose, Bld: 157 mg/dL — ABNORMAL HIGH (ref 70–99)
Potassium: 4.2 mmol/L (ref 3.5–5.1)
Sodium: 131 mmol/L — ABNORMAL LOW (ref 135–145)
Total Bilirubin: 0.7 mg/dL (ref 0.0–1.2)
Total Protein: 5.8 g/dL — ABNORMAL LOW (ref 6.5–8.1)

## 2024-01-25 LAB — PATHOLOGIST SMEAR REVIEW

## 2024-01-25 LAB — CBG MONITORING, ED: Glucose-Capillary: 219 mg/dL — ABNORMAL HIGH (ref 70–99)

## 2024-01-25 LAB — IMMATURE PLATELET FRACTION: Immature Platelet Fraction: 20.8 % — ABNORMAL HIGH (ref 1.2–8.6)

## 2024-01-25 MED ORDER — SODIUM CHLORIDE 0.9 % IV SOLN: INTRAVENOUS | Status: DC

## 2024-01-25 MED ORDER — PANCRELIPASE (LIP-PROT-AMYL) 12000-38000 UNITS PO CPEP
24000.0000 [IU] | ORAL_CAPSULE | Freq: Three times a day (TID) | ORAL | Status: DC
  Administered 2024-01-26 – 2024-01-28 (×6): 24000 [IU] via ORAL
  Filled 2024-01-25 (×9): qty 2

## 2024-01-25 MED ORDER — LORATADINE 10 MG PO TABS
10.0000 mg | ORAL_TABLET | Freq: Every evening | ORAL | Status: DC
  Administered 2024-01-26 – 2024-01-27 (×2): 10 mg via ORAL
  Filled 2024-01-25 (×2): qty 1

## 2024-01-25 MED ORDER — PANTOPRAZOLE SODIUM 40 MG PO TBEC
40.0000 mg | DELAYED_RELEASE_TABLET | Freq: Every day | ORAL | Status: DC
  Administered 2024-01-27 – 2024-01-28 (×2): 40 mg via ORAL
  Filled 2024-01-25 (×2): qty 1

## 2024-01-25 MED ORDER — INSULIN ASPART 100 UNIT/ML IJ SOLN
0.0000 [IU] | Freq: Every day | INTRAMUSCULAR | Status: DC
  Administered 2024-01-25: 2 [IU] via SUBCUTANEOUS
  Administered 2024-01-27 (×2): 4 [IU] via SUBCUTANEOUS
  Filled 2024-01-25 (×2): qty 4
  Filled 2024-01-25: qty 2

## 2024-01-25 MED ORDER — ONDANSETRON HCL 4 MG/2ML IJ SOLN
4.0000 mg | Freq: Three times a day (TID) | INTRAMUSCULAR | Status: DC | PRN
  Administered 2024-01-26: 4 mg via INTRAVENOUS

## 2024-01-25 MED ORDER — DIPHENOXYLATE-ATROPINE 2.5-0.025 MG PO TABS: 1.0000 | ORAL_TABLET | Freq: Four times a day (QID) | ORAL | Status: DC | PRN

## 2024-01-25 MED ORDER — DEXAMETHASONE 6 MG PO TABS
40.0000 mg | ORAL_TABLET | Freq: Once | ORAL | Status: AC
  Administered 2024-01-25: 40 mg via ORAL
  Filled 2024-01-25: qty 1

## 2024-01-25 MED ORDER — MAGIC MOUTHWASH W/LIDOCAINE: 5.0000 mL | Freq: Four times a day (QID) | ORAL | Status: DC | PRN

## 2024-01-25 MED ORDER — INSULIN ASPART 100 UNIT/ML IJ SOLN
0.0000 [IU] | Freq: Three times a day (TID) | INTRAMUSCULAR | Status: DC
  Administered 2024-01-26 (×2): 3 [IU] via SUBCUTANEOUS
  Administered 2024-01-27: 5 [IU] via SUBCUTANEOUS
  Administered 2024-01-27: 7 [IU] via SUBCUTANEOUS
  Administered 2024-01-27: 5 [IU] via SUBCUTANEOUS
  Administered 2024-01-28: 2 [IU] via SUBCUTANEOUS
  Administered 2024-01-28: 3 [IU] via SUBCUTANEOUS
  Filled 2024-01-25: qty 7
  Filled 2024-01-25 (×2): qty 3
  Filled 2024-01-25: qty 2
  Filled 2024-01-25 (×2): qty 5
  Filled 2024-01-25: qty 3

## 2024-01-25 MED ORDER — OXYCODONE HCL 5 MG PO TABS
5.0000 mg | ORAL_TABLET | Freq: Four times a day (QID) | ORAL | Status: DC | PRN
  Administered 2024-01-27 – 2024-01-28 (×2): 5 mg via ORAL
  Filled 2024-01-25 (×2): qty 1

## 2024-01-25 MED ORDER — ACETAMINOPHEN 325 MG PO TABS
650.0000 mg | ORAL_TABLET | Freq: Four times a day (QID) | ORAL | Status: DC | PRN
  Administered 2024-01-27: 650 mg via ORAL

## 2024-01-25 MED ORDER — ROSUVASTATIN CALCIUM 20 MG PO TABS
20.0000 mg | ORAL_TABLET | Freq: Every day | ORAL | Status: DC
  Administered 2024-01-27 – 2024-01-28 (×2): 20 mg via ORAL
  Filled 2024-01-25 (×2): qty 1

## 2024-01-25 MED ORDER — DEXAMETHASONE 6 MG PO TABS
40.0000 mg | ORAL_TABLET | Freq: Every day | ORAL | Status: DC
  Administered 2024-01-27 – 2024-01-28 (×2): 40 mg via ORAL
  Filled 2024-01-25 (×2): qty 10
  Filled 2024-01-25: qty 1

## 2024-01-25 MED ORDER — IOHEXOL 300 MG/ML  SOLN
100.0000 mL | Freq: Once | INTRAMUSCULAR | Status: AC | PRN
  Administered 2024-01-25: 100 mL via INTRAVENOUS

## 2024-01-25 MED ORDER — SODIUM CHLORIDE 0.9% IV SOLUTION
Freq: Once | INTRAVENOUS | Status: AC
  Filled 2024-01-25: qty 250
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
## 2024-01-25 NOTE — ED Triage Notes (Signed)
 Pt to ED from cancer center for c/o platelet count of 9. Pt states fatigue and no appetite. Pt has pancreatic cancer. Pt has port.
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
## 2024-01-25 NOTE — Progress Notes (Signed)
 Patient states she is weak, no appetite, nausea/vomiting, and very fatigued.  Patient states she was prescribed Zofran  which helps with the nausea.  Patient states she has had some pain in the right side.

## 2024-01-25 NOTE — ED Provider Notes (Signed)
 "  [facility] Provider Note    Event Date/Time   First MD Initiated Contact with Patient 01/25/24 1540     (approximate)   History   Abnormal Labs   HPI  Kathleen Russell is a 69 y.o. female with a history of stage IV pancreatic cancer who presents with low platelets.  The patient was sent in from oncology today with a platelet count of 9.  The patient denies any abnormal bleeding or bruising.  She states that she is feeling low, meaning fatigued and weak.  She denies any acute nausea or vomiting.  She has no acute pain at this time.  She does report some increased right upper quadrant pain over the last several days.  I reviewed the past medical records.  The patient was seen by oncology today.  The patient was noted to have severe thrombocytopenia and was sent to the ED for platelet transfusion and steroids.  Previously she was admitted earlier this month to the hospitalist service with SIRS and possible sepsis due to UTI.   Physical Exam   Triage Vital Signs: ED Triage Vitals  Encounter Vitals Group     BP 01/25/24 1535 (!) 98/57     Girls Systolic BP Percentile --      Girls Diastolic BP Percentile --      Boys Systolic BP Percentile --      Boys Diastolic BP Percentile --      Pulse Rate 01/25/24 1535 (!) 111     Resp 01/25/24 1535 17     Temp 01/25/24 1535 98.8 F (37.1 C)     Temp Source 01/25/24 1535 Oral     SpO2 01/25/24 1535 96 %     Weight 01/25/24 1539 164 lb 12.7 oz (74.8 kg)     Height 01/25/24 1539 5' 4 (1.626 m)     Head Circumference --      Peak Flow --      Pain Score 01/25/24 1537 0     Pain Loc --      Pain Education --      Exclude from Growth Chart --     Most recent vital signs: Vitals:   01/25/24 2059 01/25/24 2313  BP: (!) 98/46 (!) 98/49  Pulse: 83 68  Resp: 20 (!) 21  Temp: 98.2 F (36.8 C) 98.1 F (36.7 C)  SpO2: 97% 95%    General: Alert, weak appearing, no distress.  CV:  Good peripheral perfusion.   Resp:  Normal effort. Abd:  Soft no focal tenderness.  No peritoneal signs.  No distention.  Other:  Moist mucous membranes.   ED Results / Procedures / Treatments   Labs (all labs ordered are listed, but only abnormal results are displayed) Labs Reviewed  CBG MONITORING, ED - Abnormal; Notable for the following components:      Result Value   Glucose-Capillary 219 (*)    All other components within normal limits  CBC WITH DIFFERENTIAL/PLATELET  ADAMTS13 ACTIVITY  LIPASE, BLOOD  PRO BRAIN NATRIURETIC PEPTIDE  BASIC METABOLIC PANEL WITH GFR  PROTIME-INR  APTT  CBC WITH DIFFERENTIAL/PLATELET  URINALYSIS, COMPLETE (UACMP) WITH MICROSCOPIC  TYPE AND SCREEN  PREPARE PLATELET PHERESIS     EKG    RADIOLOGY  CT ab/pelvis: I independently viewed and interpreted the images; there are no dilated bowel loops or any free air or free fluid.  Radiology report indicates the following:  IMPRESSION:  1. Pancreatic body/tail mass measuring approximately 2.9  x 1.6 cm, not  significantly changed.  2. Worsening hepatic metastatic disease with numerous new lesions, including a  dominant inferior right hepatic lobe lesion measuring up to 7 cm.  3. New splenic infarct.    PROCEDURES:  Critical Care performed: No  Procedures   MEDICATIONS ORDERED IN ED: Medications  ondansetron  (ZOFRAN ) injection 4 mg (has no administration in time range)  acetaminophen  (TYLENOL ) tablet 650 mg (has no administration in time range)  oxyCODONE  (Oxy IR/ROXICODONE ) immediate release tablet 5 mg (has no administration in time range)  insulin  aspart (novoLOG ) injection 0-9 Units (has no administration in time range)  insulin  aspart (novoLOG ) injection 0-5 Units (2 Units Subcutaneous Given 01/25/24 2203)  dexamethasone  (DECADRON ) tablet 40 mg (has no administration in time range)  0.9 %  sodium chloride  infusion ( Intravenous New Bag/Given 01/25/24 2004)  rosuvastatin  (CRESTOR ) tablet 20 mg (has no  administration in time range)  diphenoxylate -atropine  (LOMOTIL ) 2.5-0.025 MG per tablet 1 tablet (has no administration in time range)  lipase/protease/amylase (CREON ) capsule 24,000 Units (has no administration in time range)  pantoprazole  (PROTONIX ) EC tablet 40 mg (has no administration in time range)  loratadine  (CLARITIN ) tablet 10 mg (has no administration in time range)  magic mouthwash w/lidocaine  (has no administration in time range)  0.9 %  sodium chloride  infusion (Manually program via Guardrails IV Fluids) (0 mLs Intravenous Stopped 01/25/24 2003)  dexamethasone  (DECADRON ) tablet 40 mg (40 mg Oral Given 01/25/24 1612)  iohexol  (OMNIPAQUE ) 300 MG/ML solution 100 mL (100 mLs Intravenous Contrast Given 01/25/24 1653)     IMPRESSION / MDM / ASSESSMENT AND PLAN / ED COURSE  I reviewed the triage vital signs and the nursing notes.  69 year old female with PMH as noted above presents referred from oncology due to thrombocytopenia.  She reports generalized weakness.  On exam she is borderline hypotensive and tachycardic.  Other vital signs are normal.  Differential diagnosis includes, but is not limited to, thrombocytopenia, possible peripheral destruction.  I consulted and discussed the case with Dr. Melanee from oncology who recommends platelet transfusion, p.o. dexamethasone , and admission.  Given the new worsening right upper quadrant pain I have also ordered a CT to rule out an acute complication.  Patient's presentation is most consistent with acute presentation with potential threat to life or bodily function.  ----------------------------------------- 7:48 PM on 01/25/2024 -----------------------------------------  CT shows worsening hepatic metastatic disease and a splenic infarct with no other acute findings.  The patient is receiving the platelets.  I consulted Dr. Hilma from the hospitalist service; based on our discussion he agrees to evaluate the patient for admission.  FINAL  CLINICAL IMPRESSION(S) / ED DIAGNOSES   Final diagnoses:  Hyperlipidemia LDL goal <160  Malignant neoplasm of tail of pancreas (HCC)     Rx / DC Orders   ED Discharge Orders     None        Note:  This document was prepared using Dragon voice recognition software and may include unintentional dictation errors.    Jacolyn Pae, MD 01/25/24 2323  "

## 2024-01-25 NOTE — Progress Notes (Signed)
 Transferred to Emergency room

## 2024-01-26 ENCOUNTER — Encounter: Payer: Self-pay | Admitting: Vascular Surgery

## 2024-01-26 ENCOUNTER — Other Ambulatory Visit (HOSPITAL_COMMUNITY): Payer: Self-pay

## 2024-01-26 ENCOUNTER — Encounter: Payer: Self-pay | Admitting: Pharmacist

## 2024-01-26 ENCOUNTER — Encounter: Admission: EM | Disposition: A | Payer: Self-pay | Source: Home / Self Care | Attending: Internal Medicine

## 2024-01-26 DIAGNOSIS — Z515 Encounter for palliative care: Secondary | ICD-10-CM | POA: Diagnosis not present

## 2024-01-26 DIAGNOSIS — C259 Malignant neoplasm of pancreas, unspecified: Secondary | ICD-10-CM

## 2024-01-26 DIAGNOSIS — C787 Secondary malignant neoplasm of liver and intrahepatic bile duct: Secondary | ICD-10-CM

## 2024-01-26 DIAGNOSIS — D6481 Anemia due to antineoplastic chemotherapy: Secondary | ICD-10-CM | POA: Diagnosis not present

## 2024-01-26 DIAGNOSIS — I82402 Acute embolism and thrombosis of unspecified deep veins of left lower extremity: Secondary | ICD-10-CM | POA: Diagnosis not present

## 2024-01-26 DIAGNOSIS — D696 Thrombocytopenia, unspecified: Secondary | ICD-10-CM | POA: Diagnosis not present

## 2024-01-26 DIAGNOSIS — D6959 Other secondary thrombocytopenia: Secondary | ICD-10-CM | POA: Diagnosis not present

## 2024-01-26 DIAGNOSIS — I2699 Other pulmonary embolism without acute cor pulmonale: Secondary | ICD-10-CM | POA: Diagnosis not present

## 2024-01-26 DIAGNOSIS — I82409 Acute embolism and thrombosis of unspecified deep veins of unspecified lower extremity: Secondary | ICD-10-CM

## 2024-01-26 DIAGNOSIS — D735 Infarction of spleen: Secondary | ICD-10-CM | POA: Diagnosis not present

## 2024-01-26 DIAGNOSIS — I5032 Chronic diastolic (congestive) heart failure: Secondary | ICD-10-CM | POA: Diagnosis not present

## 2024-01-26 HISTORY — PX: IVC FILTER INSERTION: CATH118245

## 2024-01-26 LAB — CBC WITH DIFFERENTIAL/PLATELET
Abs Immature Granulocytes: 0.02 K/uL (ref 0.00–0.07)
Basophils Absolute: 0 K/uL (ref 0.0–0.1)
Basophils Relative: 0 %
Eosinophils Absolute: 0 K/uL (ref 0.0–0.5)
Eosinophils Relative: 0 %
HCT: 23.3 % — ABNORMAL LOW (ref 36.0–46.0)
Hemoglobin: 7.2 g/dL — ABNORMAL LOW (ref 12.0–15.0)
Immature Granulocytes: 0 %
Lymphocytes Relative: 11 %
Lymphs Abs: 0.7 K/uL (ref 0.7–4.0)
MCH: 27.2 pg (ref 26.0–34.0)
MCHC: 30.9 g/dL (ref 30.0–36.0)
MCV: 87.9 fL (ref 80.0–100.0)
Monocytes Absolute: 0.1 K/uL (ref 0.1–1.0)
Monocytes Relative: 2 %
Neutro Abs: 6.1 K/uL (ref 1.7–7.7)
Neutrophils Relative %: 87 %
Platelets: 25 K/uL — CL (ref 150–400)
RBC: 2.65 MIL/uL — ABNORMAL LOW (ref 3.87–5.11)
RDW: 15 % (ref 11.5–15.5)
WBC: 7 K/uL (ref 4.0–10.5)
nRBC: 0 % (ref 0.0–0.2)

## 2024-01-26 LAB — PROTIME-INR
INR: 1.9 — ABNORMAL HIGH (ref 0.8–1.2)
Prothrombin Time: 22.5 s — ABNORMAL HIGH (ref 11.4–15.2)

## 2024-01-26 LAB — APTT: aPTT: 39 s — ABNORMAL HIGH (ref 24–36)

## 2024-01-26 LAB — URINALYSIS, COMPLETE (UACMP) WITH MICROSCOPIC
Bacteria, UA: NONE SEEN
Bilirubin Urine: NEGATIVE
Glucose, UA: 50 mg/dL — AB
Ketones, ur: NEGATIVE mg/dL
Leukocytes,Ua: NEGATIVE
Nitrite: NEGATIVE
Protein, ur: 100 mg/dL — AB
RBC / HPF: >50 RBC/hpf (ref 0–5)
Specific Gravity, Urine: >1.046 — ABNORMAL HIGH (ref 1.005–1.030)
pH: 6 (ref 5.0–8.0)

## 2024-01-26 LAB — BASIC METABOLIC PANEL WITH GFR
Anion gap: 11 (ref 5–15)
BUN: 21 mg/dL (ref 8–23)
CO2: 22 mmol/L (ref 22–32)
Calcium: 7.9 mg/dL — ABNORMAL LOW (ref 8.9–10.3)
Chloride: 100 mmol/L (ref 98–111)
Creatinine, Ser: 0.98 mg/dL (ref 0.44–1.00)
GFR, Estimated: >60 mL/min
Glucose, Bld: 248 mg/dL — ABNORMAL HIGH (ref 70–99)
Potassium: 4.9 mmol/L (ref 3.5–5.1)
Sodium: 134 mmol/L — ABNORMAL LOW (ref 135–145)

## 2024-01-26 LAB — LIPASE, BLOOD: Lipase: 12 U/L (ref 11–51)

## 2024-01-26 LAB — BPAM PLATELET PHERESIS
Blood Product Expiration Date: 202601212359
ISSUE DATE / TIME: 202601191643
Unit Type and Rh: 6200

## 2024-01-26 LAB — PREPARE PLATELET PHERESIS: Unit division: 0

## 2024-01-26 LAB — PREPARE RBC (CROSSMATCH)

## 2024-01-26 LAB — GLUCOSE, CAPILLARY
Glucose-Capillary: 216 mg/dL — ABNORMAL HIGH (ref 70–99)
Glucose-Capillary: 229 mg/dL — ABNORMAL HIGH (ref 70–99)
Glucose-Capillary: 219 mg/dL — ABNORMAL HIGH (ref 70–99)
Glucose-Capillary: 307 mg/dL — ABNORMAL HIGH (ref 70–99)

## 2024-01-26 LAB — PRO BRAIN NATRIURETIC PEPTIDE: Pro Brain Natriuretic Peptide: 2155 pg/mL — ABNORMAL HIGH

## 2024-01-26 LAB — CBG MONITORING, ED: Glucose-Capillary: 228 mg/dL — ABNORMAL HIGH (ref 70–99)

## 2024-01-26 MED ORDER — FENTANYL CITRATE (PF) 100 MCG/2ML IJ SOLN
INTRAMUSCULAR | Status: AC
  Filled 2024-01-26: qty 2

## 2024-01-26 MED ORDER — HEPARIN SOD (PORK) LOCK FLUSH 100 UNIT/ML IV SOLN
INTRAVENOUS | Status: AC
  Filled 2024-01-26: qty 5

## 2024-01-26 MED ORDER — HEPARIN SOD (PORK) LOCK FLUSH 100 UNIT/ML IV SOLN
500.0000 [IU] | Freq: Once | INTRAVENOUS | Status: AC
  Administered 2024-01-26: 500 [IU] via INTRAVENOUS

## 2024-01-26 MED ORDER — HYDROMORPHONE HCL 1 MG/ML IJ SOLN: 1.0000 mg | Freq: Once | INTRAMUSCULAR | Status: DC | PRN

## 2024-01-26 MED ORDER — CEFAZOLIN SODIUM-DEXTROSE 1-4 GM/50ML-% IV SOLN
INTRAVENOUS | Status: DC | PRN
  Administered 2024-01-26: 2 g via INTRAVENOUS

## 2024-01-26 MED ORDER — MIDAZOLAM HCL 2 MG/ML PO SYRP: 8.0000 mg | ORAL_SOLUTION | Freq: Once | ORAL | Status: DC | PRN

## 2024-01-26 MED ORDER — IMMUNE GLOBULIN (HUMAN) 10 GM/100ML IV SOLN
1.0000 g/kg | INTRAVENOUS | Status: AC
  Administered 2024-01-26: 65 g via INTRAVENOUS
  Administered 2024-01-27: 20 g via INTRAVENOUS
  Filled 2024-01-26: qty 650

## 2024-01-26 MED ORDER — ACETAMINOPHEN 325 MG PO TABS: 650.0000 mg | ORAL_TABLET | Freq: Every day | ORAL | Status: AC

## 2024-01-26 MED ORDER — CEFAZOLIN SODIUM-DEXTROSE 2-4 GM/100ML-% IV SOLN: 2.0000 g | INTRAVENOUS | Status: DC

## 2024-01-26 MED ORDER — INSULIN GLARGINE 100 UNIT/ML ~~LOC~~ SOLN
8.0000 [IU] | Freq: Every day | SUBCUTANEOUS | Status: DC
  Administered 2024-01-27: 8 [IU] via SUBCUTANEOUS
  Filled 2024-01-26 (×3): qty 0.08

## 2024-01-26 MED ORDER — SODIUM CHLORIDE 0.9% IV SOLUTION
Freq: Once | INTRAVENOUS | Status: AC
  Filled 2024-01-26: qty 250

## 2024-01-26 MED ORDER — MIDAZOLAM HCL (PF) 2 MG/2ML IJ SOLN
INTRAMUSCULAR | Status: DC | PRN
  Administered 2024-01-26: 1 mg via INTRAVENOUS

## 2024-01-26 MED ORDER — FENTANYL CITRATE (PF) 100 MCG/2ML IJ SOLN
INTRAMUSCULAR | Status: DC | PRN
  Administered 2024-01-26: 50 ug via INTRAVENOUS
  Administered 2024-01-26: 25 ug via INTRAVENOUS

## 2024-01-26 MED ORDER — METHYLPREDNISOLONE SODIUM SUCC 125 MG IJ SOLR: 125.0000 mg | Freq: Once | INTRAMUSCULAR | Status: DC | PRN

## 2024-01-26 MED ORDER — MIDAZOLAM HCL 5 MG/5ML IJ SOLN
INTRAMUSCULAR | Status: AC
  Filled 2024-01-26: qty 5

## 2024-01-26 MED ORDER — LIDOCAINE-EPINEPHRINE (PF) 1 %-1:200000 IJ SOLN
INTRAMUSCULAR | Status: DC | PRN
  Administered 2024-01-26: 10 mL

## 2024-01-26 MED ORDER — HEPARIN (PORCINE) IN NACL 1000-0.9 UT/500ML-% IV SOLN
INTRAVENOUS | Status: DC | PRN
  Administered 2024-01-26: 500 mL

## 2024-01-26 MED ORDER — IODIXANOL 320 MG/ML IV SOLN
INTRAVENOUS | Status: DC | PRN
  Administered 2024-01-26: 15 mL

## 2024-01-26 MED ORDER — CEFAZOLIN SODIUM-DEXTROSE 2-4 GM/100ML-% IV SOLN
INTRAVENOUS | Status: AC
  Filled 2024-01-26: qty 100

## 2024-01-26 MED ORDER — DIPHENHYDRAMINE HCL 50 MG/ML IJ SOLN: 50.0000 mg | Freq: Once | INTRAMUSCULAR | Status: DC | PRN

## 2024-01-26 MED ORDER — FAMOTIDINE 20 MG PO TABS: 40.0000 mg | ORAL_TABLET | Freq: Once | ORAL | Status: DC | PRN

## 2024-01-26 MED ORDER — SODIUM CHLORIDE 0.9 % IV SOLN: INTRAVENOUS | Status: DC

## 2024-01-26 MED ORDER — ONDANSETRON HCL 4 MG/2ML IJ SOLN
INTRAMUSCULAR | Status: AC
  Filled 2024-01-26: qty 2

## 2024-01-26 NOTE — Progress Notes (Signed)
 Dr. Marea came by & spoke with pt. Pt. Verbalized understanding of conversation with MD. Pt. OOB to BSC: voided approx. 200 ml amber UOP. Pt. C/o nausea: med. With Zofran  4 mg IVP now slowly per protocol. Report called to 1C. Stable for tx.

## 2024-01-26 NOTE — Consult Note (Signed)
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 Img Lower Unilateral Left Result Date: 01/02/2024 CLINICAL DATA:  LEFT calf swelling for 12 days EXAM: LEFT LOWER EXTREMITY VENOUS DOPPLER ULTRASOUND TECHNIQUE: Gray-scale sonography with graded compression, as well as color Doppler and duplex ultrasound were performed to evaluate the lower extremity deep venous systems from the level of the common femoral vein and including the common femoral, femoral, profunda femoral, popliteal and calf veins including the posterior tibial, peroneal and gastrocnemius veins when visible. The superficial great saphenous vein was also interrogated. Spectral Doppler was utilized to evaluate flow at rest and with distal augmentation maneuvers in the common femoral, femoral and popliteal veins. COMPARISON:  None available FINDINGS: Contralateral Common Femoral Vein: Respiratory phasicity is normal and symmetric with the symptomatic side. No evidence of thrombus. Normal compressibility. Common Femoral Vein: Intramural filling defect with decreased flow and compressibility of the common femoral vein is consistent with acute  DVT. Saphenofemoral Junction: No evidence of thrombus. Normal compressibility and flow on color Doppler imaging. Profunda Femoral Vein: No evidence of thrombus. Normal compressibility and flow on color Doppler imaging. Femoral Vein: Intramural filling defect with decreased flow and compressibility of the LEFT femoral vein is consistent with acute DVT. Popliteal Vein: Intramural filling defect with decreased flow and compressibility of the LEFT popliteal vein is consistent with acute DVT. Calf Veins: Intramural filling defect with decreased flow and compressibility of the posterior tibial and peroneal veins is consistent with acute DVT. Superficial Great Saphenous Vein: No evidence of thrombus. Normal compressibility. Other Findings:  None. IMPRESSION: Extensive acute DVT of the LEFT lower extremity involving the common femoral, femoral, popliteal, posterior tibial  and peroneal veins. Electronically Signed   By: Aliene Lloyd M.D.   On: 01/02/2024 15:49   DG Chest 2 View Result Date: 01/02/2024 CLINICAL DATA:  Tachycardia EXAM: CHEST - 2 VIEW COMPARISON:  06/17/2023 FINDINGS: Tip of the left chest port overlies the SVC. The cardiomediastinal contours are normal. The lungs are clear. Pulmonary vasculature is normal. No consolidation, pleural effusion, or pneumothorax. No acute osseous abnormalities are seen. IMPRESSION: No active cardiopulmonary disease. Electronically Signed   By: Andrea Gasman M.D.   On: 01/02/2024 15:39    Assessment and plan- Patient is a 69 y.o. female with history of metastatic pancreatic cancer with liver metastases admitted for pancytopenia  Pancytopenia mainly thrombocytopenia and anemia: Patient last received chemotherapy about 6 weeks ago and therefore her low platelets and anemia is not explained by chemotherapy.  Immature platelet fraction was high indicating a component of possible peripheral destruction.  PT PTT INR is also elevated suggestive of possible component of DIC  contributing to her thrombocytopenia.  Pathology smear review does not show any significant schistocytes.  I do not think that we are dealing with TTP kind of a situation.  ADAMTS testing however has been sent out and is currently pending.  At this time I am proceeding treating her thrombocytopenia as ITP.  Recommend 4 doses of Decadron  40 mg and she received dose 1 yesterday in the ER.  I am also adding 2 doses of IVIG starting today.  She received a unit of platelet transfusion yesterday in the ER and today platelets are up to 25.  If she does not respond well to IVIG plus Decadron -based on her overall goals of care we will decide if we need to proceed with bone marrow biopsy.  Anemia: Continue with supportive transfusions if hemoglobin less than 7.  Patient was diagnosed with pulmonary embolism on 01/02/2024 likely secondary to her underlying pancreatic cancer and ideally needs to be on anticoagulation.  However given her significant thrombocytopenia I am concerned about bleeding risks and therefore it is okay to hold Eliquis  for now.  If platelets consistently stay more than 25 but less than 50 we will plan to start her on half dose Eliquis  2.5 mg twice daily and if it is more than 50 we will consider starting her back on full dose Eliquis  5 mg twice daily.  We could also consider bridging her with heparin  drip in a day or 2 until we know which way her platelets are heading before we get started on Eliquis .  Metastatic pancreatic cancer: Discussed with patient and her family in detail that she is at significant disease progression in her liver.  Her present performance status does not allow us  to proceed with any second-line chemotherapy options.  I have discussed considering home hospice at this time and she is going to think about it.  Even if she is ferritin off down the line to consider chemotherapy it would only add weeks not months to her life expectancy and likely to worsen her quality of life.   She would like to continue with present plan of care for her pancytopenia and let us  know if she would like to proceed with hospice or not in a few days time     Visit Diagnosis 1. Thrombocytopenia   2. Hyperlipidemia LDL goal <160   3. 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 Annah Skene, MD, MPH [facility] [phone] 01/26/2024                   [  1]  Allergies Allergen Reactions   Latex Itching and Rash  [2]  Current Facility-Administered Medications:    [MAR Hold] acetaminophen  (TYLENOL ) tablet 650 mg, 650 mg, Oral, Q6H PRN, Dew, Jason S, MD   [MAR Hold] acetaminophen  (TYLENOL ) tablet 650 mg, 650 mg, Oral, Daily, Dew, Jason S, MD   [MAR Hold] dexamethasone  (DECADRON ) tablet 40 mg, 40 mg, Oral, Daily, Dew, Jason S, MD   [MAR Hold] diphenoxylate -atropine  (LOMOTIL ) 2.5-0.025 MG per tablet 1 tablet, 1 tablet, Oral, QID PRN, Marea, Selinda RAMAN, MD   [MAR Hold] HYDROmorphone  (DILAUDID ) injection 1 mg, 1 mg, Intravenous, Once PRN, Marea, Jason S, MD   [facility] Hold] Immune Globulin  10% (PRIVIGEN ) IV infusion 65 g, 1 g/kg (Adjusted), Intravenous, Q24 Hr x 2, Dew, Selinda RAMAN, MD   [MAR Hold] insulin  aspart (novoLOG ) injection 0-5 Units, 0-5 Units, Subcutaneous, QHS, Dew, Jason S, MD, 2 Units at 01/25/24 2203   [facility] Hold] insulin  aspart (novoLOG ) injection 0-9 Units, 0-9 Units, Subcutaneous, TID WC, Dew, Jason S, MD, 3 Units at 01/26/24 0837   [facility] Hold] lipase/protease/amylase (CREON ) capsule 24,000 Units, 24,000 Units, Oral, TID AC, Dew, Jason S, MD   [MAR Hold] loratadine  (CLARITIN ) tablet 10 mg, 10 mg, Oral, QPM, Dew, Jason S, MD   [MAR Hold] magic mouthwash w/lidocaine , 5 mL, Oral, QID PRN, Marea, Jason S, MD   [facility] Hold] ondansetron  (ZOFRAN ) injection 4 mg, 4 mg, Intravenous, Q8H PRN, Dew, Selinda RAMAN, MD   [MAR Hold] oxyCODONE  (Oxy IR/ROXICODONE ) immediate release tablet 5 mg, 5 mg, Oral, Q6H PRN, Dew, Jason S, MD   [MAR Hold] pantoprazole   (PROTONIX ) EC tablet 40 mg, 40 mg, Oral, Daily, Dew, Jason S, MD   [MAR Hold] rosuvastatin  (CRESTOR ) tablet 20 mg, 20 mg, Oral, Daily, Dew, Jason S, MD  Facility-Administered Medications Ordered in Other Encounters:    dextrose  5 % solution, , Intravenous, Continuous, Melanee Annah BROCKS, MD, Last Rate: 10 mL/hr at 05/08/23 0909, New Bag at 05/08/23 0909  "

## 2024-01-26 NOTE — Progress Notes (Signed)
 Chart Review Reason: Drug information Question - Medication Coverage  Summary: Patient paid cash-price for ondansetron  at the pharmacy  Called and spoke with [facility] to see if reversal/rebill is an option. Medication is covered but had required a prior auth. Pharmacy ran the claim through a drug coupon for >$200. If prior auth approval date was after the date of purchase, there is nothing they can do. If the prior auth was approved prior to the date of purchase, there may be option to send request to corporate for re-bill through her insurance and for possible refund. Cost through insurance ~$3.   Message sent to Rx Tech who submitted prior auth to confirm what the approval date was.   Manuelita FABIENE Kobs, PharmD, BCACP, CPP Clinical Pharmacist Practitioner [facility] Ph: [phone]

## 2024-01-26 NOTE — Telephone Encounter (Signed)
 Case ID: [id number] decision by 01/29/2024.

## 2024-01-26 NOTE — Op Note (Signed)
 [facility]   OPERATIVE NOTE    PRE-OPERATIVE DIAGNOSIS: DVT severe thrombocytopenia  POST-OPERATIVE DIAGNOSIS: same as above  PROCEDURE: 1.   Ultrasound guidance for vascular access to the right femoral vein 2.   Catheter placement into the inferior vena cava 3.   Inferior venacavogram 4.   Placement of a Option Elite IVC filter  SURGEON: Selinda Gu, MD  ASSISTANT(S): None  ANESTHESIA: local with Moderate Conscious Sedation for approximately 22 minutes using 1 mg of Versed  and 75 mcg of Fentanyl   ESTIMATED BLOOD LOSS: minimal  CONTRAST: 2 cc  FLUORO TIME: less than one minute  FINDING(S): 1.  Patent IVC  SPECIMEN(S):  none  INDICATIONS:   Kathleen Russell is a 69 y.o. female who presents with a DVT and severe thrombocytopenia.  Inferior vena cava filter is indicated for this reason.  Risks and benefits including filter thrombosis, migration, fracture, bleeding, and infection were all discussed.  We discussed that all IVC filters that we place can be removed if desired from the patient once the need for the filter has passed.    DESCRIPTION: After obtaining full informed written consent, the patient was brought back to the vascular suite. The skin was sterilely prepped and draped in a sterile surgical field was created. Moderate conscious sedation was administered during a face to face encounter with the patient throughout the procedure with my supervision of the RN administering medicines and monitoring the patient's vital signs, pulse oximetry, telemetry and mental status throughout from the start of the procedure until the patient was taken to the recovery room. The right femoral vein was accessed under direct ultrasound guidance without difficulty with a Seldinger needle and a J-wire was then placed. After skin nick and dilatation, the delivery sheath was placed into the inferior vena cava and an inferior venacavogram was performed. This demonstrated a  patent IVC with the level of the renal veins at the L1-L2 interspace.  The filter was then deployed into the inferior vena cava at the level of L2 just below the renal veins. The delivery sheath was then removed. Pressure was held. Sterile dressings were placed. The patient tolerated the procedure well and was taken to the recovery room in stable condition.  COMPLICATIONS: None  CONDITION: Stable  Selinda Gu  01/26/2024, 3:15 PM   This note was created with Dragon Medical transcription system. Any errors in dictation are purely unintentional.

## 2024-01-26 NOTE — ED Notes (Signed)
 Platlets were completed on 01/25/24 at 22:30  entered late

## 2024-01-26 NOTE — Progress Notes (Signed)
 " PROGRESS NOTE    Kathleen Russell  FMW:[id number] DOB: 08/05/1955 DOA: 01/25/2024 PCP: Marylynn Verneita CROME, MD  [id number]-[id number]  LOS: 1 day   Brief hospital course:   Assessment & Plan: Kathleen Russell is a 69 y.o. female with medical history significant of stage IV pancreatic cancer metastasized to liver metastasis (has not received chemo since 12/8), anemia, thrombocytopenia, DVT and PE on Eliquis ), HLD, DM, dCHF, GERD, chronic diarrhea, Raynaud's phenomena, skin cancer, kidney stone, recent admission due to UTI, who presents with weakness and abnormal lab with thrombocytopenia.    Thrombocytopenia:  platelet 9.0 on presentation.  No schistocytes on peripheral smear.  Had mild nosebleeding involving which has resolved.  No signs of GI bleeding. Dr. Melanee of oncology is consulted.  Per Dr. Melanee, pt has has not received chemo since 12/8, this is therefore not chemo associated pancytopenia. if platelets do not improve with steroids will consider bone marrow biopsy in a few days time - s/p Transfuse 1 unit of platelets - Started oral ecadron 40 mg daily per Dr. Melanee --start IVIG, per Dr. 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 - Follow-up with Dr. Jao's recommendation   Chronic diarrhea -Continue Creon  and as needed Lomotil    Hyperlipidemia LDL goal <70 - Crestor    Leukocytosis:  WBC 14.2 (15.0 01/20/2024.  No source of infection identified so far.  No respiratory symptoms.  No symptoms of UTI. -Follow-up with CBC   Chronic diastolic CHF (congestive heart failure) (HCC):  2D echo on 01/03/2024 showed EF of 60-85% with grade 1 diastolic dysfunction.  Patient has trace leg edema, no SOB.  CHF seems to be compensated.   Diabetes mellitus without complication [facility]):  Recent A1c 7.7, poorly controlled.   Patient is taking Januvia . Pt is also on Lantus  10 units daily, but she is not taking Lantus  currently due to poor appetite and decreased oral intake.   --start glargine 8u nightly --ACHS and SSI   Splenic infarction:  This is new findings.  Unfortunately we have to hold Eliquis  now due to severe thrombocytopenia. -Follow-up oncologist and vascular surgeon's recommendation  Hyponatremia --monitor    DVT prophylaxis: SCD/Compression stockings Code Status: DNR  Family Communication: husband updated at bedside today Level of care: Med-Surg Dispo:   The patient is from: home Anticipated d/c is to: home Anticipated d/c date is: 2-3 days   Subjective and Interval History:  Pt received IVC filter today.  Tolerated it well.  Pt reported feeling better, improved appetite.   Objective: Vitals:   01/26/24 1600 01/26/24 1615 01/26/24 1718 01/26/24 2153  BP: (!) 113/45  (!) 105/42 (!) 103/43  Pulse: 78 77 74 78  Resp: 20 16 14 18   Temp:  (!) 97.3 F (36.3 C) 97.6 F (36.4 C) 97.8 F (36.6 C)  TempSrc:  Temporal    SpO2: 94% 97% 97% 96%  Weight:      Height:        Intake/Output Summary (Last 24 hours) at 01/26/2024 2206 Last data filed at 01/26/2024 1841 Gross per 24 hour  Intake 903.67 ml  Output 475 ml  Net 428.67 ml   Filed Weights   01/25/24 1539 01/26/24 1315  Weight: 74.8 kg 74.8 kg    Examination:   Constitutional: NAD, AAOx3 HEENT: conjunctivae  and lids normal, EOMI CV: No cyanosis.   RESP: normal respiratory effort, on RA Neuro: II - XII grossly intact.   Psych: Normal mood and affect.  Appropriate judgement and reason   Data Reviewed: I have personally reviewed labs and imaging studies  Time spent: 50 minutes  Ellouise Haber, MD [facility] If 7PM-7AM, please contact night-coverage 01/26/2024, 10:06 PM   "

## 2024-01-26 NOTE — Consult Note (Signed)
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 strain (RV/LV ratio of 1.06). 3. Mild bilateral lower lobe atelectasis. Electronically Signed   By: Suzen Dials M.D.   On: 01/02/2024 17:26   US  Venous Img Lower Unilateral Left Result Date: 01/02/2024 CLINICAL DATA:  LEFT calf swelling for 12 days EXAM: LEFT LOWER EXTREMITY VENOUS DOPPLER ULTRASOUND TECHNIQUE: Gray-scale sonography with graded compression, as well as color Doppler and duplex ultrasound were performed to evaluate the lower extremity deep venous systems from the level of the common femoral vein and including the common femoral, femoral, profunda femoral, popliteal and calf veins including the posterior tibial, peroneal and gastrocnemius veins when visible. The superficial great saphenous vein was also interrogated. Spectral Doppler was utilized to evaluate flow at rest and with distal augmentation maneuvers in the common femoral, femoral and popliteal veins. COMPARISON:  None available FINDINGS: Contralateral Common Femoral Vein: Respiratory phasicity is normal and symmetric with the symptomatic side. No evidence of thrombus. Normal compressibility. Common Femoral Vein: Intramural filling defect with decreased flow and compressibility of the common femoral vein  is consistent with acute DVT. Saphenofemoral Junction: No evidence of thrombus. Normal compressibility and flow on color Doppler imaging. Profunda Femoral Vein: No evidence of thrombus. Normal compressibility and flow on color Doppler imaging. Femoral Vein: Intramural filling defect with decreased flow and compressibility of the LEFT femoral vein is consistent with acute DVT. Popliteal Vein: Intramural filling defect with decreased flow and compressibility of the LEFT popliteal vein is consistent with acute DVT. Calf Veins: Intramural filling defect  with decreased flow and compressibility of the posterior tibial and peroneal veins is consistent with acute DVT. Superficial Great Saphenous Vein: No evidence of thrombus. Normal compressibility. Other Findings:  None. IMPRESSION: Extensive acute DVT of the LEFT lower extremity involving the common femoral, femoral, popliteal, posterior tibial and peroneal veins. Electronically Signed   By: Aliene Lloyd M.D.   On: 01/02/2024 15:49   DG Chest 2 View Result Date: 01/02/2024 CLINICAL DATA:  Tachycardia EXAM: CHEST - 2 VIEW COMPARISON:  06/17/2023 FINDINGS: Tip of the left chest port overlies the SVC. The cardiomediastinal contours are normal. The lungs are clear. Pulmonary vasculature is normal. No consolidation, pleural effusion, or pneumothorax. No acute osseous abnormalities are seen. IMPRESSION: No active cardiopulmonary disease. Electronically Signed   By: Andrea Gasman M.D.   On: 01/02/2024 15:39      Assessment/Plan 1. Acute Left Lower Extremity DVT  The patient was diagnosed with a left lower extremity DVT and pulmonary embolism on January 02, 2024 subsequently started on Eliquis .  Unfortunately she has become thrombocytopenic as a consequence of treatment for stage IV pancreatic cancer with liver metastasis.  Given her significantly decreased platelets and anemia her oncologist has recommended and asked that we place an IVC filter.  Given that we  are currently holding her Eliquis  due to her thrombocytopenia, we will place an IVC filter today.  I have discussed the risk, benefits and alternatives the patient she is agreeable to proceed.  2.  Chronic diastolic CHF No significant edema noted today, continue monitoring  3.  Splenic infarct Apically anticoagulation is recommended with splenic infarct.  However in the setting of her thrombocytopenia this is not possible at this juncture.  Plan of care discussed with Dr. Jama and he is in agreement with plan noted above.   Family Communication: At bedside   I spent 45 minutes in this encounter including personally reviewing extensive medical records, personally reviewing imaging studies and compared to prior scans, counseling the patient, placing orders, coordinating care and performing appropriate documentation  Thank you for allowing us  to participate in the care of this patient.   Elysha Daw E Mendel Binsfeld, NP [facility] [phone] (Office Phone) [phone] (Office Fax) [phone] (Pager)  01/26/2024 10:07 AM  Staff may message me via secure chat in Epic  but this may not receive immediate response,  please page for urgent matters!  Dictation software was used to generate the above note. Typos may occur and escape review, as with typed/written notes. Any error is purely unintentional.  Please contact me directly for clarity if needed.      [1]  Social History Tobacco Use   Smoking status: Never   Smokeless tobacco: Never  Vaping Use   Vaping status: Never Used  Substance Use Topics   Alcohol use: Not Currently    Comment: few glasses wine per year   Drug use: No  [2]  Allergies Allergen Reactions   Latex Itching and Rash   "

## 2024-01-26 NOTE — Inpatient Diabetes Management (Signed)
 Inpatient Diabetes Program Recommendations  AACE/ADA: New Consensus Statement on Inpatient Glycemic Control   Target Ranges:  Prepandial:   less than 140 mg/dL      Peak postprandial:   less than 180 mg/dL (1-2 hours)      Critically ill patients:  140 - 180 mg/dL    Latest Reference Range & Units 01/25/24 22:02 01/26/24 08:02  Glucose-Capillary 70 - 99 mg/dL 780 (H) 771 (H)   Review of Glycemic Control  Diabetes history: DM2 Outpatient Diabetes medications: Lantus  10 units daily, Januvia  25 mg daily Current orders for Inpatient glycemic control: Novolog  0-9 units TID with meals, Novolog  0-5 units at bedtime; Decadron  4 mg daily  Inpatient Diabetes Program Recommendations:    Insulin : Per chart patient received Decadron  40 mg at 16:12 on 1/19 and ordered Decadron  4 mg daily now.  If steroids are continued, please consider ordering insulin  glargine 8 units Q24H.  If patient will remain NPO, please consider changing CBGs to Q4H and Novolog  0-9 units to Q4H.  Thanks, Earnie Gainer, RN, MSN, CDCES Diabetes Coordinator Inpatient Diabetes Program [phone] (Team Pager from 8am to 5pm)

## 2024-01-26 NOTE — Consult Note (Signed)
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 education dealing with the complex and emotionally intense issues of symptom management and palliative care in the setting of serious and potentially life-threatening illness.Greater than 50%  of this time was spent counseling and coordinating care related to the above assessment and plan.  Signed by: Fonda Mower, PhD, NP-C   "

## 2024-01-27 ENCOUNTER — Other Ambulatory Visit (HOSPITAL_COMMUNITY): Payer: Self-pay

## 2024-01-27 ENCOUNTER — Inpatient Hospital Stay

## 2024-01-27 ENCOUNTER — Encounter: Payer: Self-pay | Admitting: Internal Medicine

## 2024-01-27 ENCOUNTER — Inpatient Hospital Stay: Admitting: Hospice and Palliative Medicine

## 2024-01-27 DIAGNOSIS — D735 Infarction of spleen: Secondary | ICD-10-CM | POA: Diagnosis not present

## 2024-01-27 DIAGNOSIS — E119 Type 2 diabetes mellitus without complications: Secondary | ICD-10-CM | POA: Diagnosis not present

## 2024-01-27 DIAGNOSIS — I2699 Other pulmonary embolism without acute cor pulmonale: Secondary | ICD-10-CM | POA: Diagnosis not present

## 2024-01-27 DIAGNOSIS — C252 Malignant neoplasm of tail of pancreas: Secondary | ICD-10-CM | POA: Diagnosis not present

## 2024-01-27 DIAGNOSIS — D696 Thrombocytopenia, unspecified: Secondary | ICD-10-CM | POA: Diagnosis not present

## 2024-01-27 LAB — APTT
aPTT: 27 s (ref 24–36)
aPTT: 48 s — ABNORMAL HIGH (ref 24–36)

## 2024-01-27 LAB — BASIC METABOLIC PANEL WITH GFR
Anion gap: 12 (ref 5–15)
BUN: 36 mg/dL — ABNORMAL HIGH (ref 8–23)
CO2: 20 mmol/L — ABNORMAL LOW (ref 22–32)
Calcium: 8.3 mg/dL — ABNORMAL LOW (ref 8.9–10.3)
Chloride: 96 mmol/L — ABNORMAL LOW (ref 98–111)
Creatinine, Ser: 1.17 mg/dL — ABNORMAL HIGH (ref 0.44–1.00)
GFR, Estimated: 50 mL/min — ABNORMAL LOW
Glucose, Bld: 310 mg/dL — ABNORMAL HIGH (ref 70–99)
Potassium: 4.1 mmol/L (ref 3.5–5.1)
Sodium: 128 mmol/L — ABNORMAL LOW (ref 135–145)

## 2024-01-27 LAB — GLUCOSE, CAPILLARY
Glucose-Capillary: 267 mg/dL — ABNORMAL HIGH (ref 70–99)
Glucose-Capillary: 252 mg/dL — ABNORMAL HIGH (ref 70–99)
Glucose-Capillary: 304 mg/dL — ABNORMAL HIGH (ref 70–99)
Glucose-Capillary: 322 mg/dL — ABNORMAL HIGH (ref 70–99)

## 2024-01-27 LAB — BPAM RBC
Blood Product Expiration Date: 202602092359
ISSUE DATE / TIME: 202601201051
Unit Type and Rh: 5100

## 2024-01-27 LAB — CBC
HCT: 30.1 % — ABNORMAL LOW (ref 36.0–46.0)
Hemoglobin: 9.6 g/dL — ABNORMAL LOW (ref 12.0–15.0)
MCH: 27.8 pg (ref 26.0–34.0)
MCHC: 31.9 g/dL (ref 30.0–36.0)
MCV: 87.2 fL (ref 80.0–100.0)
Platelets: 32 K/uL — ABNORMAL LOW (ref 150–400)
RBC: 3.45 MIL/uL — ABNORMAL LOW (ref 3.87–5.11)
RDW: 15.4 % (ref 11.5–15.5)
WBC: 16.2 K/uL — ABNORMAL HIGH (ref 4.0–10.5)
nRBC: 0 % (ref 0.0–0.2)

## 2024-01-27 LAB — TYPE AND SCREEN
ABO/RH(D): A POS
Antibody Screen: NEGATIVE
Unit division: 0

## 2024-01-27 LAB — HEPARIN LEVEL (UNFRACTIONATED): Heparin Unfractionated: 0.61 [IU]/mL (ref 0.30–0.70)

## 2024-01-27 LAB — PROTIME-INR
INR: 1.6 — ABNORMAL HIGH (ref 0.8–1.2)
Prothrombin Time: 19.8 s — ABNORMAL HIGH (ref 11.4–15.2)

## 2024-01-27 LAB — MAGNESIUM: Magnesium: 2.1 mg/dL (ref 1.7–2.4)

## 2024-01-27 MED ORDER — IMMUNE GLOBULIN (HUMAN) 20 GM/200ML IV SOLN
1.0000 g/kg | Freq: Once | INTRAVENOUS | Status: AC
  Administered 2024-01-27: 65 g via INTRAVENOUS
  Filled 2024-01-27: qty 650

## 2024-01-27 MED ORDER — CHLORHEXIDINE GLUCONATE CLOTH 2 % EX PADS
6.0000 | MEDICATED_PAD | Freq: Every day | CUTANEOUS | Status: DC
  Administered 2024-01-27 – 2024-01-28 (×2): 6 via TOPICAL

## 2024-01-27 MED ORDER — SODIUM CHLORIDE 0.9% FLUSH: 10.0000 mL | INTRAVENOUS | Status: DC | PRN

## 2024-01-27 MED ORDER — HEPARIN (PORCINE) 25000 UT/250ML-% IV SOLN
1300.0000 [IU]/h | INTRAVENOUS | Status: DC
  Administered 2024-01-27: 1100 [IU]/h via INTRAVENOUS
  Filled 2024-01-27 (×2): qty 250

## 2024-01-27 MED ORDER — SODIUM CHLORIDE 0.9% FLUSH
10.0000 mL | Freq: Two times a day (BID) | INTRAVENOUS | Status: DC
  Administered 2024-01-27: 10 mL
  Administered 2024-01-28: 20 mL
  Administered 2024-01-28: 10 mL

## 2024-01-27 MED ORDER — SIMETHICONE 80 MG PO CHEW
80.0000 mg | CHEWABLE_TABLET | Freq: Once | ORAL | Status: AC
  Administered 2024-01-27: 80 mg via ORAL
  Filled 2024-01-27: qty 1

## 2024-01-27 NOTE — Inpatient Diabetes Management (Signed)
 Inpatient Diabetes Program Recommendations  AACE/ADA: New Consensus Statement on Inpatient Glycemic Control   Target Ranges:  Prepandial:   less than 140 mg/dL      Peak postprandial:   less than 180 mg/dL (1-2 hours)      Critically ill patients:  140 - 180 mg/dL    Latest Reference Range & Units 01/25/24 22:02 01/26/24 08:02 01/26/24 13:12 01/26/24 15:12 01/26/24 17:23 01/26/24 21:52  Glucose-Capillary 70 - 99 mg/dL 780 (H) 771 (H) 783 (H) 229 (H) 219 (H) 307 (H)   Review of Glycemic Control  Diabetes history: DM2 Outpatient Diabetes medications: Lantus  10 units daily, Januvia  25 mg daily Current orders for Inpatient glycemic control: Lantus  8 units at bedtime, Novolog  0-9 units TID with meals, Novolog  0-5 units at bedtime; Decadron  4 mg daily   Inpatient Diabetes Program Recommendations:     Insulin : Per chart patient received Decadron  40 mg at 16:12 on 1/19 and ordered Decadron  4 mg daily now. Patient did NOT receive Lantus  on 01/26/24 and CBG 307 mg/dl today.  Please consider changing frequency of Lantus  8 units to Q24H to start now.    Thanks, Earnie Gainer, RN, MSN, CDCES Diabetes Coordinator Inpatient Diabetes Program [phone] (Team Pager from 8am to 5pm)

## 2024-01-27 NOTE — Progress Notes (Signed)
 PHARMACY - ANTICOAGULATION CONSULT NOTE  Pharmacy Consult for Heparin  Infusion- no bolus per oncology Indication: pulmonary embolus  Allergies[1]  Patient Measurements: Height: 5' 4 (162.6 cm) Weight: 74.7 kg (164 lb 12.7 oz) IBW/kg (Calculated) : 54.7 HEPARIN  DW (KG): 70.3  Vital Signs: Temp: 97.6 F (36.4 C) (01/21 1633) Temp Source: Oral (01/21 1633) BP: 105/50 (01/21 1633) Pulse Rate: 73 (01/21 1633)  Labs: Recent Labs    01/25/24 1415 01/25/24 1415 01/25/24 2250 01/26/24 0419 01/27/24 0438 01/27/24 0951 01/27/24 1841  HGB 7.5*   < > 7.0* 7.2* 9.6*  --   --   HCT 23.8*  --  22.4* 23.3* 30.1*  --   --   PLT 9*  --  27* 25* 32*  --   --   APTT  --   --   --  39*  --  27 48*  LABPROT  --   --   --  22.5*  --  19.8*  --   INR  --   --   --  1.9*  --  1.6*  --   HEPARINUNFRC  --   --   --   --   --  0.61  --   CREATININE 1.20*  --   --  0.98 1.17*  --   --    < > = values in this interval not displayed.    Estimated Creatinine Clearance: 44.9 mL/min (A) (by C-G formula based on SCr of 1.17 mg/dL (H)).   Medical History: Past Medical History:  Diagnosis Date   allergic rhinitis    Seem to be year-round and especially seasonal in Spring/Fall   Broken ankle    Cataract    Chicken pox    COVID-19 01/14/2020   GERD (gastroesophageal reflux disease)    History of broken nose    Hx: UTI (urinary tract infection)    Kidney infection    Left breast mass 02/24/2019   Menopause, premature    Pancreatic cancer (HCC)    Raynaud's phenomenon    Rhinitis, nonallergic    SIRS (systemic inflammatory response syndrome) (HCC) 06/18/2023   Skin cancer 02/18/2019   Dr. Arin Isenstein [facility], 2nd instance on 03/19/2022   Type 2 diabetes mellitus with obesity 06/10/2021    Medications:  Previously on apixaban  5 mg twice daily- last dose 1/18  Assessment: Patient is a 69 year old with known DVT and PE on apixaban  outpatient (last dose noted to be 1/18). She  has a history of metastatic pancreatic cancer with liver metastases and presented with thrombocytopenia (PLTs 9). Per oncology: Patient last received chemotherapy about 6 weeks ago and therefore her low platelets and anemia is not explained by chemotherapy. Anticoagulation was held upon admission due to bleeding risk. Oncology's recommendation: Hold Eliquis  for now. If platelets consistently stay more than 25 but less than 50 we will plan to start her on half dose Eliquis  2.5 mg twice daily and if it is more than 50 we will consider starting her back on full dose Eliquis  5 mg twice daily. We could also consider bridging her with heparin  drip in a day or 2 until we know which way her platelets are heading before we get started on Eliquis .  Pharmacy has been consulted to initiate patient on a heparin  infusion w/no boluses.  Baseline labs:  INR 1.6 aPTT 27 HL 0.61 Hgb 9.6 PLT 32  Goal of Therapy:  Heparin  level 0.3-0.7 units/ml aPTT 66-102 seconds Monitor platelets by anticoagulation protocol: Yes  Date/Time: HL/aPTT: Rate/Comment: 1/21@1841  -- / 48s  Subtherapeutic@1100  units/hr   Plan:  - Will not bolus patient at all given low PLTs and discussion with oncology - Increase heparin  infusion rate to 1300 units/hr - Monitor via aPTT levels since heparin  level appears to still be elevated; transition to monitoring via HL when the two correlate - Check aPTT level 6 hours after rate change - Monitor CBC daily while on heparin  infusion  Omesha Bowerman A Hannahmarie Asberry, PharmD Clinical Pharmacist 01/27/2024 7:05 PM        [1]  Allergies Allergen Reactions   Latex Itching and Rash

## 2024-01-27 NOTE — Plan of Care (Signed)

## 2024-01-27 NOTE — Progress Notes (Signed)
 PHARMACY - ANTICOAGULATION CONSULT NOTE  Pharmacy Consult for Heparin  Infusion- no bolus per oncology Indication: pulmonary embolus  Allergies[1]  Patient Measurements: Height: 5' 4 (162.6 cm) Weight: 74.7 kg (164 lb 12.7 oz) IBW/kg (Calculated) : 54.7 HEPARIN  DW (KG): 70.3  Vital Signs: Temp: 97.7 F (36.5 C) (01/21 0244) Temp Source: Oral (01/21 0244) BP: 113/44 (01/21 0758) Pulse Rate: 73 (01/21 0758)  Labs: Recent Labs    01/25/24 1415 01/25/24 1415 01/25/24 2250 01/26/24 0419 01/27/24 0438  HGB 7.5*   < > 7.0* 7.2* 9.6*  HCT 23.8*  --  22.4* 23.3* 30.1*  PLT 9*  --  27* 25* 32*  APTT  --   --   --  39*  --   LABPROT  --   --   --  22.5*  --   INR  --   --   --  1.9*  --   CREATININE 1.20*  --   --  0.98 1.17*   < > = values in this interval not displayed.    Estimated Creatinine Clearance: 44.9 mL/min (A) (by C-G formula based on SCr of 1.17 mg/dL (H)).   Medical History: Past Medical History:  Diagnosis Date   allergic rhinitis    Seem to be year-round and especially seasonal in Spring/Fall   Broken ankle    Cataract    Chicken pox    COVID-19 01/14/2020   GERD (gastroesophageal reflux disease)    History of broken nose    Hx: UTI (urinary tract infection)    Kidney infection    Left breast mass 02/24/2019   Menopause, premature    Pancreatic cancer (HCC)    Raynaud's phenomenon    Rhinitis, nonallergic    SIRS (systemic inflammatory response syndrome) (HCC) 06/18/2023   Skin cancer 02/18/2019   Dr. Arin Isenstein [facility], 2nd instance on 03/19/2022   Type 2 diabetes mellitus with obesity 06/10/2021    Medications:  Previously on apixaban  5 mg twice daily- last dose 1/18  Assessment: Patient is a 69 year old with known DVT and PE on apixaban  outpatient (last dose noted to be 1/18). She has a history of metastatic pancreatic cancer with liver metastases and presented with thrombocytopenia (PLTs 9). Per oncology: Patient last  received chemotherapy about 6 weeks ago and therefore her low platelets and anemia is not explained by chemotherapy. Anticoagulation was held upon admission due to bleeding risk. Oncology's recommendation: Hold Eliquis  for now. If platelets consistently stay more than 25 but less than 50 we will plan to start her on half dose Eliquis  2.5 mg twice daily and if it is more than 50 we will consider starting her back on full dose Eliquis  5 mg twice daily. We could also consider bridging her with heparin  drip in a day or 2 until we know which way her platelets are heading before we get started on Eliquis .  Pharmacy has been consulted to initiate patient on a heparin  infusion w/no boluses.  Baseline labs:  INR 1.6 aPTT 27 HL 0.61 Hgb 9.6 PLT 32  Goal of Therapy:  Heparin  level 0.3-0.7 units/ml aPTT 66-102 seconds Monitor platelets by anticoagulation protocol: Yes   Plan:  - Will not bolus patient at all given low PLTs and discussion with oncology - Start heparin  infusion at a rate of 1100 units/hr - Monitor via aPTT levels since heparin  level appears to still be elevated; transition to monitoring via HL when the two correlate - Check aPTT level 6 hours after start of  infusion - Monitor CBC daily while on heparin  infusion  Lum VEAR Mania, PharmD, BCPS 01/27/2024,9:12 AM      [1]  Allergies Allergen Reactions   Latex Itching and Rash

## 2024-01-27 NOTE — Telephone Encounter (Signed)
 Pharmacy Patient Advocate Encounter  Received notification from [facility] [employer] that Prior Authorization for Ondansetron  8MG  dispersible tablets has been APPROVED from 01/27/2024 to 01/26/2025. Ran test claim, Copay is $1. This test claim was processed through [facility]- copay amounts may vary at other pharmacies due to pharmacy/plan contracts, or as the patient moves through the different stages of their insurance plan.   PA #/Case ID/Reference #: [id number]

## 2024-01-27 NOTE — Telephone Encounter (Signed)
 Pt is aware and gave a verbal understanding.

## 2024-01-27 NOTE — Progress Notes (Signed)
 [facility]  - [place] at [facility]   PATIENT NAME: Kathleen Russell    MR#:  [id number]  DATE OF BIRTH:  1955-06-16  SUBJECTIVE:  husband at bedside. Patient states she feels much better today. Eight decent breakfast. Started on IV heparin  drip after discussion with Dr. Melanee since platelet count up to 52,000.    VITALS:  Blood pressure (!) 113/44, pulse 73, temperature 97.7 F (36.5 C), temperature source Oral, resp. rate 18, height 5' 4 (1.626 m), weight 74.7 kg, SpO2 100%.  PHYSICAL EXAMINATION:   GENERAL:  69 y.o.-year-old patient with no acute distress.  LUNGS: Normal breath sounds bilaterally, no wheezing CARDIOVASCULAR: S1, S2 normal. No murmur   ABDOMEN: Soft, nontender, nondistended. Bowel sounds present.  EXTREMITIES: No  edema b/l.    NEUROLOGIC: nonfocal  patient is alert and awake  LABORATORY PANEL:  CBC Recent Labs  Lab 01/27/24 0438  WBC 16.2*  HGB 9.6*  HCT 30.1*  PLT 32*    Chemistries  Recent Labs  Lab 01/25/24 1415 01/26/24 0419 01/27/24 0438  NA 131*   < > 128*  K 4.2   < > 4.1  CL 98   < > 96*  CO2 22   < > 20*  GLUCOSE 157*   < > 310*  BUN 15   < > 36*  CREATININE 1.20*   < > 1.17*  CALCIUM  8.0*   < > 8.3*  MG  --   --  2.1  AST 42*  --   --   ALT 14  --   --   ALKPHOS 102  --   --   BILITOT 0.7  --   --    < > = values in this interval not displayed.   Cardiac Enzymes No results for input(s): TROPONINI in the last 168 hours. RADIOLOGY:  PERIPHERAL VASCULAR CATHETERIZATION Result Date: 01/26/2024 See surgical note for result.  CT ABDOMEN PELVIS W CONTRAST Result Date: 01/25/2024 EXAM: CT ABDOMEN AND PELVIS WITH CONTRAST 01/25/2024 04:58:59 PM TECHNIQUE: CT of the abdomen and pelvis was performed with the administration of 100 mL of iohexol  (OMNIPAQUE ) 300 MG/ML solution. Multiplanar reformatted images are provided for review. Automated exposure control, iterative reconstruction, and/or weight-based adjustment of  the mA/kV was utilized to reduce the radiation dose to as low as reasonably achievable. COMPARISON: 11/23/2023. CLINICAL HISTORY: Abdominal pain, acute, nonlocalized; Worsening RUQ pain, hx cancer. FINDINGS: LOWER CHEST: Small right pleural effusion. Dependent atelectasis in the right lower lobe. LIVER: Worsening metastatic disease in the liver. Right hepatic lesion on image 30 measures up to 4 cm compared to 1.6 cm previously. Numerous new lesions throughout the liver, mostly right hepatic lobe. A large lesion in the inferior right hepatic lobe measures up to 7 cm, likely a 1 cm area on the prior study. GALLBLADDER AND BILE DUCTS: Gallbladder is unremarkable. No biliary ductal dilatation. SPLEEN: There is a wedge-shaped low-density area in the upper to mid spleen posteriorly most compatible with splenic infarct. This is new since the prior study. PANCREAS: Pancreatic body/tail mass again noted measuring approximately 2.9 x 1.6 cm, not significantly changed. Atrophy of the pancreatic tail beyond the mass lesion. ADRENAL GLANDS: No acute abnormality. KIDNEYS, URETERS AND BLADDER: Small cysts in the kidneys bilaterally are stable. No stones in the kidneys or ureters. No hydronephrosis. No perinephric or periureteral stranding. Urinary bladder is unremarkable. GI AND BOWEL: Stomach demonstrates no acute abnormality. Sigmoid diverticulosis. No active diverticulitis. There is no bowel obstruction. PERITONEUM  AND RETROPERITONEUM: Trace free fluid in the cul de sac. No free air. VASCULATURE: Aorta is normal in caliber. LYMPH NODES: No lymphadenopathy. REPRODUCTIVE ORGANS: No acute abnormality. BONES AND SOFT TISSUES: No acute osseous abnormality. Small umbilical hernia containing fat. No focal soft tissue abnormality. IMPRESSION: 1. Pancreatic body/tail mass measuring approximately 2.9 x 1.6 cm, not significantly changed. 2. Worsening hepatic metastatic disease with numerous new lesions, including a dominant inferior right  hepatic lobe lesion measuring up to 7 cm. 3. New splenic infarct. Electronically signed by: Franky Crease MD 01/25/2024 05:26 PM EST RP Workstation: [id number]    Assessment and Plan   Kathleen Russell is a 69 y.o. female with medical history significant of stage IV pancreatic cancer metastasized to liver metastasis (has not received chemo since 12/8), anemia, thrombocytopenia, DVT and PE on Eliquis ), HLD, DM, dCHF, GERD, chronic diarrhea, Raynaud's phenomena, skin cancer, kidney stone, recent admission due to UTI, who presents with weakness and abnormal lab with thrombocytopenia.    Thrombocytopenia: suspected ITP --platelet 9.0 on presentation.  No schistocytes on peripheral smear.  Had mild nosebleeding involving which has resolved.  No signs of GI bleeding. Dr. Melanee of oncology is consulted.  Per Dr. Melanee, pt has has not received chemo since 12/8, this is therefore not chemo associated pancytopenia. if platelets do not improve with steroids will consider bone marrow biopsy in a few days time - s/p Transfused 1 unit of platelets - Started oral ecadron 40 mg daily per Dr. Melanee --start IVIG and Decadron  per Dr. Melanee  --plt 9K--27--25--32   Normocytic anemia:  --1u pRBC for Hgb 7.2--1 unit PRBC 9.6  Pulmonary embolism (HCC)  left leg DVT (HCC):  - Hold Eliquis  --1/19--s/p IVC filter by dr dew --1/20--Per Dr Melanee ok to start IV heparin  gtt   Malignant neoplasm of tail of pancreas [facility]): Metastasized to the liver. - Follow-up with Dr. Darold recommendation--chemo on hold since 12/2023 -- appreciate palliative care input. Hospice options at home have been just   Chronic diarrhea -Continue Creon  and as needed Lomotil    Hyperlipidemia LDL goal <70 - Crestor    Leukocytosis:  WBC 14.2 (15.0 01/20/2024.  No source of infection identified so far.  No respiratory symptoms.  No symptoms of UTI. -Follow-up with CBC   Chronic diastolic CHF (congestive heart failure) (HCC):  2D echo on 01/03/2024  showed EF of 60-85% with grade 1 diastolic dysfunction.  Patient has trace leg edema, no SOB.  CHF seems to be compensated.   Diabetes mellitus without complication [facility]):  Recent A1c 7.7, poorly controlled.  Patient is taking Januvia . Pt is also on Lantus  10 units daily, but she is not taking Lantus  currently due to poor appetite and decreased oral intake.   --start glargine 8u nightly --ACHS and SSI   Splenic infarction:  This is new findings.  Unfortunately we have to hold Eliquis  now due to severe thrombocytopenia. -Follow-up oncologist and vascular surgeon's recommendation   Hyponatremia --monitor     DVT prophylaxis: SCD/Compression stockings-- started heparin  drip Code Status: DNR  Family Communication: husband updated at bedside today Consults : oncology, palliative care Level of care: Med-Surg Status is: Inpatient Remains inpatient appropriate because: patient remains on IV heparin  drip for treatment of PE and DVT. Mandatory platelet count.    TOTAL TIME TAKING CARE OF THIS PATIENT: 35 minutes.  >50% time spent on counselling and coordination of care  Note: This dictation was prepared with Dragon dictation along with smaller phrase technology. Any transcriptional errors  that result from this process are unintentional.  Leita Blanch M.D    [facility]   CC: Primary care physician; Marylynn Verneita CROME, MD

## 2024-01-27 NOTE — Care Management Important Message (Signed)
 Important Message  Patient Details  Name: Kathleen Russell MRN: [id number] Date of Birth: 02-Feb-1955   Important Message Given:  Yes - Medicare IM     Klaudia Beirne W, CMA 01/27/2024, 12:35 PM

## 2024-01-28 ENCOUNTER — Other Ambulatory Visit: Payer: Self-pay

## 2024-01-28 ENCOUNTER — Other Ambulatory Visit (HOSPITAL_COMMUNITY): Payer: Self-pay

## 2024-01-28 ENCOUNTER — Encounter: Payer: Self-pay | Admitting: Oncology

## 2024-01-28 DIAGNOSIS — C252 Malignant neoplasm of tail of pancreas: Secondary | ICD-10-CM

## 2024-01-28 DIAGNOSIS — I2699 Other pulmonary embolism without acute cor pulmonale: Secondary | ICD-10-CM | POA: Diagnosis not present

## 2024-01-28 DIAGNOSIS — D735 Infarction of spleen: Secondary | ICD-10-CM

## 2024-01-28 DIAGNOSIS — D696 Thrombocytopenia, unspecified: Secondary | ICD-10-CM

## 2024-01-28 DIAGNOSIS — Z515 Encounter for palliative care: Secondary | ICD-10-CM | POA: Diagnosis not present

## 2024-01-28 LAB — GLUCOSE, CAPILLARY
Glucose-Capillary: 199 mg/dL — ABNORMAL HIGH (ref 70–99)
Glucose-Capillary: 224 mg/dL — ABNORMAL HIGH (ref 70–99)

## 2024-01-28 LAB — ADAMTS13 ACTIVITY: Adamts 13 Activity: 60.8 % — ABNORMAL LOW

## 2024-01-28 LAB — HEPARIN LEVEL (UNFRACTIONATED)
Heparin Unfractionated: 0.58 [IU]/mL (ref 0.30–0.70)
Heparin Unfractionated: 0.61 [IU]/mL (ref 0.30–0.70)

## 2024-01-28 LAB — CBC
HCT: 26.6 % — ABNORMAL LOW (ref 36.0–46.0)
Hemoglobin: 8.5 g/dL — ABNORMAL LOW (ref 12.0–15.0)
MCH: 28.1 pg (ref 26.0–34.0)
MCHC: 32 g/dL (ref 30.0–36.0)
MCV: 87.8 fL (ref 80.0–100.0)
Platelets: 24 K/uL — CL (ref 150–400)
RBC: 3.03 MIL/uL — ABNORMAL LOW (ref 3.87–5.11)
RDW: 15.1 % (ref 11.5–15.5)
WBC: 12.5 K/uL — ABNORMAL HIGH (ref 4.0–10.5)
nRBC: 0 % (ref 0.0–0.2)

## 2024-01-28 LAB — APTT
aPTT: 70 s — ABNORMAL HIGH (ref 24–36)
aPTT: 115 s — ABNORMAL HIGH (ref 24–36)

## 2024-01-28 LAB — ADAMTS13 ACTIVITY REFLEX

## 2024-01-28 MED ORDER — OXYCODONE HCL 5 MG PO TABS
5.0000 mg | ORAL_TABLET | Freq: Four times a day (QID) | ORAL | 0 refills | Status: AC | PRN
  Filled 2024-01-28: qty 30, 8d supply, fill #0

## 2024-01-28 MED ORDER — PROMETHAZINE HCL 25 MG PO TABS
12.5000 mg | ORAL_TABLET | Freq: Four times a day (QID) | ORAL | 0 refills | Status: AC | PRN
  Filled 2024-01-28: qty 15, 8d supply, fill #0

## 2024-01-28 MED ORDER — ONDANSETRON HCL 8 MG PO TABS
8.0000 mg | ORAL_TABLET | Freq: Three times a day (TID) | ORAL | 0 refills | Status: DC | PRN
  Filled 2024-01-28: qty 30, 10d supply, fill #0

## 2024-01-28 MED ORDER — INSULIN PEN NEEDLE 32G X 4 MM MISC
0 refills | Status: AC
  Filled 2024-01-28 (×2): qty 100, 100d supply, fill #0

## 2024-01-28 MED ORDER — HEPARIN SOD (PORK) LOCK FLUSH 100 UNIT/ML IV SOLN
500.0000 [IU] | Freq: Once | INTRAVENOUS | Status: AC
  Administered 2024-01-28: 500 [IU] via INTRAVENOUS
  Filled 2024-01-28: qty 5

## 2024-01-28 MED ORDER — DIPHENOXYLATE-ATROPINE 2.5-0.025 MG PO TABS
1.0000 | ORAL_TABLET | Freq: Four times a day (QID) | ORAL | 0 refills | Status: AC | PRN
  Filled 2024-01-28 (×2): qty 30, 8d supply, fill #0

## 2024-01-28 MED FILL — Immune Globulin (Human) IV Soln 5 GM/50ML: INTRAVENOUS | Qty: 50 | Status: AC

## 2024-01-28 NOTE — TOC CM/SW Note (Signed)
 Transition of Care [facility]) - Inpatient Brief Assessment   Patient Details  Name: Kathleen Russell MRN: [id number] Date of Birth: December 09, 1955  Transition of Care [facility]) CM/SW Contact:    Nathanael CHRISTELLA Ring, RN Phone Number: 01/28/2024, 1:44 PM   Clinical Narrative:   Transition of Care [facility]) Screening Note   Patient Details  Name: Kathleen Russell Date of Birth: 1955-02-20   Transition of Care [facility]) CM/SW Contact:    Nathanael CHRISTELLA Ring, RN Phone Number: 01/28/2024, 1:45 PM    Transition of Care Department [facility]) has reviewed patient and no TOC needs have been identified at this time.  If new patient transition needs arise, please place a TOC consult.    Transition of Care Asessment: Insurance and Status: Insurance coverage has been reviewed Patient has primary care physician: Yes Home environment has been reviewed: from home with husband Prior level of function:: Independent Prior/Current Home Services: No current home services Social Drivers of Health Review: SDOH reviewed no interventions necessary Readmission risk has been reviewed: Yes Transition of care needs: no transition of care needs at this time

## 2024-01-28 NOTE — Plan of Care (Signed)
  Problem: Fluid Volume: Goal: Ability to maintain a balanced intake and output will improve Outcome: Progressing   Problem: Health Behavior/Discharge Planning: Goal: Ability to identify and utilize available resources and services will improve Outcome: Progressing   Problem: Nutritional: Goal: Maintenance of adequate nutrition will improve Outcome: Progressing   Problem: Skin Integrity: Goal: Risk for impaired skin integrity will decrease Outcome: Progressing
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
## 2024-01-28 NOTE — Discharge Summary (Signed)
 " Physician Discharge Summary   Patient: Kathleen Russell MRN: [id number] DOB: Aug 03, 1955  Admit date:     01/25/2024  Discharge date: 01/28/24  Discharge Physician: Leita Blanch   PCP: Marylynn Verneita CROME, MD   Recommendations at discharge:    F/u Dr marylynn in 1-2 weeks 2.follow-up with Dr. Melanee next Tuesday or Wednesday.  3. Cancer center palliative care to follow as outpt  Discharge Diagnoses: Principal Problem:   Thrombocytopenia Active Problems:   Normocytic anemia   Pulmonary embolism (HCC)   Left leg DVT (HCC)   Malignant neoplasm of tail of pancreas (HCC)   Chronic diarrhea   Hyperlipidemia LDL goal <70   Leukocytosis   Chronic diastolic CHF (congestive heart failure) (HCC)   Diabetes mellitus without complication (HCC)   Splenic infarction   Palliative care encounter  [name] is a 69 y.o. female with medical history significant of stage IV pancreatic cancer metastasized to liver metastasis (has not received chemo since 12/8), anemia, thrombocytopenia, DVT and PE on Eliquis ), HLD, DM, dCHF, GERD, chronic diarrhea, Raynaud's phenomena, skin cancer, kidney stone, recent admission due to UTI, who presents with weakness and abnormal lab with thrombocytopenia.    Thrombocytopenia: suspected ITP --platelet 9.0 on presentation.  No schistocytes on peripheral smear.  Had mild nosebleeding involving which has resolved.  No signs of GI bleeding. Dr. Melanee of oncology is consulted.  Per Dr. Melanee, pt has has not received chemo since 12/8, this is therefore not chemo associated pancytopenia. if platelets do not improve with steroids will consider bone marrow biopsy in a few days time - s/p Transfused 1 unit of platelets - Started oral decadron  40 mg daily per Dr. Melanee x3 days --start IVIG and Decadron  per Dr. Melanee  --plt 9K--27--25--32--24K   Normocytic anemia:  --1u pRBC for Hgb 7.2--1 unit PRBC 9.6--8.5   Pulmonary embolism (HCC)  left leg DVT (HCC):  - Hold Eliquis  --1/19--s/p  IVC filter by dr dew --1/20--Per Dr Melanee ok to start IV heparin  gtt --1/22--decision was made to stop blood thinner given drop in plt ct despite above rx--d/w dr Melanee and Sidra   Malignant neoplasm of tail of pancreas [facility]): Metastasized to the liver. - Follow-up with Dr. Darold recommendation--chemo on hold since 12/2023 -- appreciate palliative care input. Hospice options at home have been just--for now Palliative care for home   Chronic diarrhea -Continue Creon  and as needed Lomotil    Hyperlipidemia LDL goal <70 - Crestor --on hold   Leukocytosis:  WBC 14.2 (15.0 01/20/2024.  No source of infection identified so far.  No respiratory symptoms.  No symptoms of UTI.   Chronic diastolic CHF (congestive heart failure) (HCC):  2D echo on 01/03/2024 showed EF of 60-85% with grade 1 diastolic dysfunction.  Patient has trace leg edema, no SOB.  CHF seems to be compensated.   Diabetes mellitus without complication [facility]):  Recent A1c 7.7, poorly controlled.  Patient is taking Januvia . Pt is also on Lantus  10 units daily, but she is not taking Lantus  currently due to poor appetite and decreased oral intake.   --start glargine 8u nightly --ACHS and SSI   Splenic infarction:  This is new findings.  Unfortunately we have to hold Eliquis  now due to severe thrombocytopenia. -Follow-up oncologist and vascular surgeon's recommendation   Hyponatremia --monitor  D/w Dr Melanee (phone), josh, pt's son Dr Joellyn), and husband--plan to d/chome today and f/u as out t next week to determine further plan     DVT prophylaxis:  SCD/Compression stockings-- started heparin  drip Code Status: DNR  Family Communication: husband updated at bedside today Consults : oncology, palliative care     Pain control - [place]  Controlled Substance Reporting System database was reviewed. and patient was instructed, not to drive, operate heavy machinery, perform activities at heights, swimming or participation in  water  activities or provide baby-sitting services while on Pain, Sleep and Anxiety Medications; until their outpatient Physician has advised to do so again. Also recommended to not to take more than prescribed Pain, Sleep and Anxiety Medications.  Disposition: Home Diet recommendation:  Regular diet DISCHARGE MEDICATION: Allergies as of 01/28/2024       Reactions   Latex Itching, Rash        Medication List     PAUSE taking these medications    metoprolol  succinate 25 MG 24 hr tablet Wait to take this until your doctor or other care provider tells you to start again. Commonly known as: TOPROL -XL Take 1 tablet (25 mg total) by mouth daily.   rosuvastatin  20 MG tablet Wait to take this until your doctor or other care provider tells you to start again. Commonly known as: CRESTOR  TAKE 1 TABLET(20 MG) BY MOUTH DAILY       STOP taking these medications    amoxicillin -clavulanate 875-125 MG tablet Commonly known as: AUGMENTIN    Eliquis  5 MG Tabs tablet Generic drug: apixaban    loperamide  2 MG capsule Commonly known as: IMODIUM    potassium chloride  10 MEQ tablet Commonly known as: KLOR-CON  M       TAKE these medications    acetaminophen  500 MG tablet Commonly known as: TYLENOL  Take 500 mg by mouth every 8 (eight) hours as needed.   blood glucose meter kit and supplies Dispense based on patient and insurance preference. Use up to four times daily as directed. (FOR ICD-10 E10.9, E11.9).   Creon  24000-76000 units Cpep Generic drug: Pancrelipase  (Lip-Prot-Amyl) Take 1 capsule (24,000 Units total) by mouth 3 (three) times daily before meals.   diphenoxylate -atropine  2.5-0.025 MG tablet Commonly known as: LOMOTIL  Take 1 tablet by mouth 4 (four) times daily as needed for diarrhea or loose stools.   fluticasone  50 MCG/ACT nasal spray Commonly known as: FLONASE  SHAKE LIQUID AND USE 2 SPRAYS IN EACH NOSTRIL AT BEDTIME AS NEEDED   FreeStyle Libre 3 Plus Sensor  Misc Change sensor every 15 days.   Januvia  25 MG tablet Generic drug: sitaGLIPtin  TAKE 1 TABLET(25 MG) BY MOUTH DAILY   Lantus  SoloStar 100 UNIT/ML Solostar Pen Generic drug: insulin  glargine Inject 10 Units into the skin daily. 30 units on steroid days,  10 units all other days   magic mouthwash (multi-ingredient) oral suspension Swish and swallow 5-10 mLs 4 (four) times daily.   magic mouthwash w/lidocaine  Soln Take 5 mLs by mouth 4 (four) times daily as needed for mouth pain.   ondansetron  8 MG tablet Commonly known as: Zofran  Take 1 tablet (8 mg total) by mouth every 8 (eight) hours as needed for nausea or vomiting.   oxyCODONE  5 MG immediate release tablet Commonly known as: Oxy IR/ROXICODONE  Take 1 tablet (5 mg total) by mouth every 6 (six) hours as needed for moderate pain (pain score 4-6).   pantoprazole  20 MG tablet Commonly known as: PROTONIX  TAKE 1 TABLET(20 MG) BY MOUTH DAILY   Pen Needles 32G X 6 MM Misc Use to give insulin    potassium chloride  10 MEQ tablet Commonly known as: KLOR-CON  Take 10 mEq by mouth daily. You may have  different instructions for this medication elsewhere on this list. Ask your doctor how you should be taking this medication.   promethazine  12.5 MG tablet Commonly known as: PHENERGAN  Take 1 tablet (12.5 mg total) by mouth every 6 (six) hours as needed for nausea or vomiting.   Xyzal Allergy 24HR 5 MG tablet Generic drug: levocetirizine Take 5 mg by mouth every evening.        Follow-up Information     Marylynn Verneita CROME, MD. Schedule an appointment as soon as possible for a visit in 1 week(s).   Specialty: Internal Medicine Contact information: [address] [place] [address] [phone]         Melanee Annah BROCKS, MD. Schedule an appointment as soon as possible for a visit on 02/02/2024.   Specialty: Oncology Contact information: [address] [place] [address] [phone]                 Discharge Exam: Fredricka Weights   01/25/24 1539 01/26/24 1315 01/27/24 0500  Weight: 74.8 kg 74.8 kg 74.7 kg   GENERAL:  69 y.o.-year-old patient with no acute distress.  LUNGS: Normal breath sounds bilaterally, no wheezing CARDIOVASCULAR: S1, S2 normal. No murmur   ABDOMEN: Soft, nontender, nondistended. EXTREMITIES: No  edema b/l.    NEUROLOGIC: nonfocal  patient is alert and awake  Condition at discharge: fair  The results of significant diagnostics from this hospitalization (including imaging, microbiology, ancillary and laboratory) are listed below for reference.   Imaging Studies: PERIPHERAL VASCULAR CATHETERIZATION Result Date: 01/26/2024 See surgical note for result.  CT ABDOMEN PELVIS W CONTRAST Result Date: 01/25/2024 EXAM: CT ABDOMEN AND PELVIS WITH CONTRAST 01/25/2024 04:58:59 PM TECHNIQUE: CT of the abdomen and pelvis was performed with the administration of 100 mL of iohexol  (OMNIPAQUE ) 300 MG/ML solution. Multiplanar reformatted images are provided for review. Automated exposure control, iterative reconstruction, and/or weight-based adjustment of the mA/kV was utilized to reduce the radiation dose to as low as reasonably achievable. COMPARISON: 11/23/2023. CLINICAL HISTORY: Abdominal pain, acute, nonlocalized; Worsening RUQ pain, hx cancer. FINDINGS: LOWER CHEST: Small right pleural effusion. Dependent atelectasis in the right lower lobe. LIVER: Worsening metastatic disease in the liver. Right hepatic lesion on image 30 measures up to 4 cm compared to 1.6 cm previously. Numerous new lesions throughout the liver, mostly right hepatic lobe. A large lesion in the inferior right hepatic lobe measures up to 7 cm, likely a 1 cm area on the prior study. GALLBLADDER AND BILE DUCTS: Gallbladder is unremarkable. No biliary ductal dilatation. SPLEEN: There is a wedge-shaped low-density area in the upper to mid spleen posteriorly most compatible with splenic infarct. This is new since  the prior study. PANCREAS: Pancreatic body/tail mass again noted measuring approximately 2.9 x 1.6 cm, not significantly changed. Atrophy of the pancreatic tail beyond the mass lesion. ADRENAL GLANDS: No acute abnormality. KIDNEYS, URETERS AND BLADDER: Small cysts in the kidneys bilaterally are stable. No stones in the kidneys or ureters. No hydronephrosis. No perinephric or periureteral stranding. Urinary bladder is unremarkable. GI AND BOWEL: Stomach demonstrates no acute abnormality. Sigmoid diverticulosis. No active diverticulitis. There is no bowel obstruction. PERITONEUM AND RETROPERITONEUM: Trace free fluid in the cul de sac. No free air. VASCULATURE: Aorta is normal in caliber. LYMPH NODES: No lymphadenopathy. REPRODUCTIVE ORGANS: No acute abnormality. BONES AND SOFT TISSUES: No acute osseous abnormality. Small umbilical hernia containing fat. No focal soft tissue abnormality. IMPRESSION: 1. Pancreatic body/tail mass measuring approximately 2.9 x 1.6 cm, not  significantly changed. 2. Worsening hepatic metastatic disease with numerous new lesions, including a dominant inferior right hepatic lobe lesion measuring up to 7 cm. 3. New splenic infarct. Electronically signed by: Franky Crease MD 01/25/2024 05:26 PM EST RP Workstation: [id number]   DG Chest Port 1 View Result Date: 01/09/2024 EXAM: 1 VIEW(S) XRAY OF THE CHEST 01/09/2024 09:44:00 PM COMPARISON: None available. CLINICAL HISTORY: Questionable sepsis - evaluate for abnormality. Fever. History of stage 4 pancreatic cancer with liver metastases. FINDINGS: LINES, TUBES AND DEVICES: Power port type central venous catheter with tip at the cavoatrial junction region. LUNGS AND PLEURA: Shallow inspiration. Lungs are clear. No pleural effusion or pneumothorax. HEART AND MEDIASTINUM: Heart size and pulmonary vascularity are normal. Mediastinal contours appear intact. Calcification of the aorta. BONES AND SOFT TISSUES: No acute osseous abnormality. IMPRESSION: 1.  No acute cardiopulmonary abnormality. 2. Central venous catheter tip projects at the cavoatrial junction. Electronically signed by: Elsie Gravely MD 01/09/2024 09:50 PM EST RP Workstation: [id number]   ECHOCARDIOGRAM COMPLETE Result Date: 01/03/2024    ECHOCARDIOGRAM REPORT   Patient Name:   SHENICKA SUNDERLIN Date of Exam: 01/03/2024 Medical Rec #:  [id number]        Height:       64.0 in Accession #:    [id number]       Weight:       170.0 lb Date of Birth:  04-20-1955         BSA:          1.826 m Patient Age:    68 years         BP:           114/55 mmHg Patient Gender: F                HR:           98 bpm. Exam Location:  [facility] Procedure: 2D Echo, Color Doppler and Cardiac Doppler (Both Spectral and Color            Flow Doppler were utilized during procedure). Indications:     I26.09 Pulmonary Embolus  History:         Patient has prior history of Echocardiogram examinations.                  Pancreatic CA; Risk Factors:Diabetes.  Sonographer:     L. Thornton-Maynard, RDCS Referring Phys:  [id number] VELNA SAUNDERS [facility] Diagnosing Phys: Redell Cave MD  Sonographer Comments: Global longitudinal strain was attempted. IMPRESSIONS  1. Left ventricular ejection fraction, by estimation, is 60 to 65%. The left ventricle has normal function. The left ventricle has no regional wall motion abnormalities. Left ventricular diastolic parameters are consistent with Grade I diastolic dysfunction (impaired relaxation).  2. Right ventricular systolic function is normal. The right ventricular size is normal. There is normal pulmonary artery systolic pressure.  3. The mitral valve is normal in structure. Mild mitral valve regurgitation.  4. The aortic valve is tricuspid. Aortic valve regurgitation is not visualized.  5. The inferior vena cava is normal in size with greater than 50% respiratory variability, suggesting right atrial pressure of 3 mmHg. FINDINGS  Left Ventricle: Left ventricular ejection fraction, by estimation, is  60 to 65%. The left ventricle has normal function. The left ventricle has no regional wall motion abnormalities. Global longitudinal strain performed but not reported based on interpreter judgement due to suboptimal tracking. The left ventricular internal cavity size was normal in size. There is no left ventricular hypertrophy.  Left ventricular diastolic parameters are consistent with Grade I diastolic dysfunction (impaired relaxation). Right Ventricle: The right ventricular size is normal. No increase in right ventricular wall thickness. Right ventricular systolic function is normal. There is normal pulmonary artery systolic pressure. The tricuspid regurgitant velocity is 2.28 m/s, and  with an assumed right atrial pressure of 3 mmHg, the estimated right ventricular systolic pressure is 23.8 mmHg. Left Atrium: Left atrial size was normal in size. Right Atrium: Right atrial size was normal in size. Pericardium: There is no evidence of pericardial effusion. Mitral Valve: The mitral valve is normal in structure. Mild mitral valve regurgitation. MV peak gradient, 6.0 mmHg. The mean mitral valve gradient is 3.0 mmHg. Tricuspid Valve: The tricuspid valve is normal in structure. Tricuspid valve regurgitation is not demonstrated. Aortic Valve: The aortic valve is tricuspid. Aortic valve regurgitation is not visualized. Aortic valve mean gradient measures 3.5 mmHg. Aortic valve peak gradient measures 6.0 mmHg. Aortic valve area, by VTI measures 3.07 cm. Pulmonic Valve: The pulmonic valve was normal in structure. Pulmonic valve regurgitation is not visualized. Aorta: The aortic root and ascending aorta are structurally normal, with no evidence of dilitation. Venous: The inferior vena cava is normal in size with greater than 50% respiratory variability, suggesting right atrial pressure of 3 mmHg. IAS/Shunts: No atrial level shunt detected by color flow Doppler.  LEFT VENTRICLE PLAX 2D LVIDd:         3.70 cm     Diastology  LVIDs:         2.30 cm     LV e' medial:    7.18 cm/s LV PW:         1.10 cm     LV E/e' medial:  11.0 LV IVS:        1.00 cm     LV e' lateral:   5.87 cm/s LVOT diam:     2.00 cm     LV E/e' lateral: 13.5 LV SV:         67 LV SV Index:   36 LVOT Area:     3.14 cm LV IVRT:       103 msec  LV Volumes (MOD) LV vol d, MOD A2C: 39.4 ml LV vol d, MOD A4C: 43.0 ml LV vol s, MOD A2C: 14.5 ml LV vol s, MOD A4C: 9.1 ml LV SV MOD A2C:     24.9 ml LV SV MOD A4C:     43.0 ml LV SV MOD BP:      29.0 ml RIGHT VENTRICLE RV Basal diam:  2.70 cm RV Mid diam:    2.00 cm RV S prime:     11.00 cm/s TAPSE (M-mode): 1.6 cm LEFT ATRIUM             Index        RIGHT ATRIUM           Index LA diam:        3.80 cm 2.08 cm/m   RA Area:     10.70 cm LA Vol (A2C):   34.9 ml 19.12 ml/m  RA Volume:   20.40 ml  11.17 ml/m LA Vol (A4C):   44.6 ml 24.43 ml/m LA Biplane Vol: 40.7 ml 22.29 ml/m  AORTIC VALVE                    PULMONIC VALVE AV Area (Vmax):    2.82 cm     PV Vmax:  0.85 m/s AV Area (Vmean):   2.81 cm     PV Peak grad:  2.9 mmHg AV Area (VTI):     3.07 cm AV Vmax:           122.50 cm/s AV Vmean:          85.750 cm/s AV VTI:            0.217 m AV Peak Grad:      6.0 mmHg AV Mean Grad:      3.5 mmHg LVOT Vmax:         110.00 cm/s LVOT Vmean:        76.700 cm/s LVOT VTI:          0.212 m LVOT/AV VTI ratio: 0.98  AORTA Ao Root diam: 3.60 cm Ao Asc diam:  3.40 cm MITRAL VALVE                TRICUSPID VALVE MV Area VTI:  3.33 cm      TR Peak grad:   20.8 mmHg MV Peak grad: 6.0 mmHg      TR Vmax:        228.00 cm/s MV Mean grad: 3.0 mmHg MV Vmax:      1.22 m/s      SHUNTS MV Vmean:     88.3 cm/s     Systemic VTI:  0.21 m MV E velocity: 79.10 cm/s   Systemic Diam: 2.00 cm MV A velocity: 107.00 cm/s MV E/A ratio:  0.74 Redell Cave MD Electronically signed by Redell Cave MD Signature Date/Time: 01/03/2024/1:26:46 PM    Final    CT HEAD WO CONTRAST ( ) Result Date: 01/02/2024 EXAM: CT HEAD WITHOUT CONTRAST  01/02/2024 05:17:18 PM TECHNIQUE: CT of the head was performed without the administration of intravenous contrast. Automated exposure control, iterative reconstruction, and/or weight based adjustment of the mA/kV was utilized to reduce the radiation dose to as low as reasonably achievable. COMPARISON: MRI 12/25/2020. CLINICAL HISTORY: Headache, new onset (Age >= 51y). FINDINGS: BRAIN AND VENTRICLES: No acute hemorrhage. No evidence of acute infarct. No hydrocephalus. No extra-axial collection. No mass effect or midline shift. ORBITS: No acute abnormality. SINUSES: No acute abnormality. SOFT TISSUES AND SKULL: No acute soft tissue abnormality. No skull fracture. IMPRESSION: 1. No acute intracranial abnormality. Electronically signed by: Donnice Mania MD 01/02/2024 06:00 PM EST RP Workstation: [id number]   CT Angio Chest PE W and/or Wo Contrast Result Date: 01/02/2024 CLINICAL DATA:  History of metastatic pancreatic cancer presenting with left calf pain and suspected pulmonary embolism. EXAM: CT ANGIOGRAPHY CHEST WITH CONTRAST TECHNIQUE: Multidetector CT imaging of the chest was performed using the standard protocol during bolus administration of intravenous contrast. Multiplanar CT image reconstructions and MIPs were obtained to evaluate the vascular anatomy. RADIATION DOSE REDUCTION: This exam was performed according to the departmental dose-optimization program which includes automated exposure control, adjustment of the mA and/or kV according to patient size and/or use of iterative reconstruction technique. CONTRAST:  75mL OMNIPAQUE  IOHEXOL  350 MG/ML SOLN COMPARISON:  November 23, 2023 FINDINGS: Cardiovascular: There is stable left-sided venous Port-A-Cath positioning. Satisfactory opacification of the pulmonary arteries to the segmental level. A moderate amount of intraluminal low attenuation is seen within the distal aspect of the right pulmonary artery, with extension to involve its right middle lobe and  right lower lobe branches. Normal heart size. Very mild right heart strain is noted (RV/LV ratio of 1.06). No pericardial effusion. Mediastinum/Nodes: No enlarged mediastinal, hilar, or axillary lymph nodes. Thyroid   gland, trachea, and esophagus demonstrate no significant findings. Lungs/Pleura: Mild atelectasis is seen within the posterior aspect of the bilateral lower lobes. No acute infiltrate, pleural effusion or pneumothorax is identified. Upper Abdomen: No acute abnormality. Musculoskeletal: No chest wall abnormality. No acute or significant osseous findings. Review of the MIP images confirms the above findings. IMPRESSION: 1. Moderate amount of pulmonary embolism within the distal aspect of the right pulmonary artery, with extension to involve its right middle lobe and right lower lobe branches. 2. Very mild right heart strain (RV/LV ratio of 1.06). 3. Mild bilateral lower lobe atelectasis. Electronically Signed   By: Suzen Dials M.D.   On: 01/02/2024 17:26   US  Venous Img Lower Unilateral Left Result Date: 01/02/2024 CLINICAL DATA:  LEFT calf swelling for 12 days EXAM: LEFT LOWER EXTREMITY VENOUS DOPPLER ULTRASOUND TECHNIQUE: Gray-scale sonography with graded compression, as well as color Doppler and duplex ultrasound were performed to evaluate the lower extremity deep venous systems from the level of the common femoral vein and including the common femoral, femoral, profunda femoral, popliteal and calf veins including the posterior tibial, peroneal and gastrocnemius veins when visible. The superficial great saphenous vein was also interrogated. Spectral Doppler was utilized to evaluate flow at rest and with distal augmentation maneuvers in the common femoral, femoral and popliteal veins. COMPARISON:  None available FINDINGS: Contralateral Common Femoral Vein: Respiratory phasicity is normal and symmetric with the symptomatic side. No evidence of thrombus. Normal compressibility. Common Femoral  Vein: Intramural filling defect with decreased flow and compressibility of the common femoral vein is consistent with acute DVT. Saphenofemoral Junction: No evidence of thrombus. Normal compressibility and flow on color Doppler imaging. Profunda Femoral Vein: No evidence of thrombus. Normal compressibility and flow on color Doppler imaging. Femoral Vein: Intramural filling defect with decreased flow and compressibility of the LEFT femoral vein is consistent with acute DVT. Popliteal Vein: Intramural filling defect with decreased flow and compressibility of the LEFT popliteal vein is consistent with acute DVT. Calf Veins: Intramural filling defect with decreased flow and compressibility of the posterior tibial and peroneal veins is consistent with acute DVT. Superficial Great Saphenous Vein: No evidence of thrombus. Normal compressibility. Other Findings:  None. IMPRESSION: Extensive acute DVT of the LEFT lower extremity involving the common femoral, femoral, popliteal, posterior tibial and peroneal veins. Electronically Signed   By: Aliene Lloyd M.D.   On: 01/02/2024 15:49   DG Chest 2 View Result Date: 01/02/2024 CLINICAL DATA:  Tachycardia EXAM: CHEST - 2 VIEW COMPARISON:  06/17/2023 FINDINGS: Tip of the left chest port overlies the SVC. The cardiomediastinal contours are normal. The lungs are clear. Pulmonary vasculature is normal. No consolidation, pleural effusion, or pneumothorax. No acute osseous abnormalities are seen. IMPRESSION: No active cardiopulmonary disease. Electronically Signed   By: Andrea Gasman M.D.   On: 01/02/2024 15:39    Microbiology: Results for orders placed or performed in visit on 01/20/24  Urine Culture     Status: Abnormal   Collection Time: 01/20/24  3:25 PM   Specimen: Urine, Clean Catch  Result Value Ref Range Status   Specimen Description   Final    URINE, CLEAN CATCH Performed at [facility], [address], [place], [place] [address]    Special Requests    Final    NONE Performed at [facility], [address] [place], [place] [address]    Culture (A)  Final    <10,000 COLONIES/mL INSIGNIFICANT GROWTH Performed at [facility],  [address] [place], [place] [address]    Report Status 01/21/2024 FINAL  Final    Labs: CBC: Recent Labs  Lab 01/25/24 1415 01/25/24 2250 01/26/24 0419 01/27/24 0438 01/28/24 0158  WBC 14.2* 8.6 7.0 16.2* 12.5*  NEUTROABS 11.5* 8.0* 6.1  --   --   HGB 7.5* 7.0* 7.2* 9.6* 8.5*  HCT 23.8* 22.4* 23.3* 30.1* 26.6*  MCV 88.1 87.5 87.9 87.2 87.8  PLT 9* 27* 25* 32* 24*   Basic Metabolic Panel: Recent Labs  Lab 01/25/24 1415 01/26/24 0419 01/27/24 0438  NA 131* 134* 128*  K 4.2 4.9 4.1  CL 98 100 96*  CO2 22 22 20*  GLUCOSE 157* 248* 310*  BUN 15 21 36*  CREATININE 1.20* 0.98 1.17*  CALCIUM  8.0* 7.9* 8.3*  MG  --   --  2.1   Liver Function Tests: Recent Labs  Lab 01/25/24 1415  AST 42*  ALT 14  ALKPHOS 102  BILITOT 0.7  PROT 5.8*  ALBUMIN 2.7*   CBG: Recent Labs  Lab 01/27/24 1217 01/27/24 1635 01/27/24 2148 01/28/24 0802 01/28/24 1235  GLUCAP 252* 304* 322* 199* 224*    Discharge time spent: greater than 30 minutes.  Signed: Leita Blanch, MD [facility] 01/28/2024 "

## 2024-01-28 NOTE — Inpatient Diabetes Management (Signed)
 Inpatient Diabetes Program Recommendations  AACE/ADA: New Consensus Statement on Inpatient Glycemic Control   Target Ranges:  Prepandial:   less than 140 mg/dL      Peak postprandial:   less than 180 mg/dL (1-2 hours)      Critically ill patients:  140 - 180 mg/dL    Latest Reference Range & Units 01/27/24 07:59 01/27/24 12:17 01/27/24 16:35 01/27/24 21:48 01/28/24 08:02  Glucose-Capillary 70 - 99 mg/dL 732 (H) 747 (H) 695 (H) 322 (H) 199 (H)   Review of Glycemic Control  Diabetes history: DM2 Outpatient Diabetes medications: Lantus  10 units daily, Januvia  25 mg daily Current orders for Inpatient glycemic control: Lantus  8 units at bedtime, Novolog  0-9 units TID with meals, Novolog  0-5 units at bedtime; Decadron  40 mg daily   Inpatient Diabetes Program Recommendations:    Insulin : If steroids are continued, please consider increasing Lantus  to 10 units daily and ordering Novolog  4 units TID with meals for meal coverage if patient eats at least 50% of meals.  Thanks, Earnie Gainer, RN, MSN, CDCES Diabetes Coordinator Inpatient Diabetes Program [phone] (Team Pager from 8am to 5pm)

## 2024-01-28 NOTE — Progress Notes (Signed)
 PHARMACY - ANTICOAGULATION CONSULT NOTE  Pharmacy Consult for Heparin  Infusion- no bolus per oncology Indication: pulmonary embolus  Allergies[1]  Patient Measurements: Height: 5' 4 (162.6 cm) Weight: 74.7 kg (164 lb 12.7 oz) IBW/kg (Calculated) : 54.7 HEPARIN  DW (KG): 70.3  Vital Signs: Temp: 97.5 F (36.4 C) (01/22 0803) Temp Source: Oral (01/22 0803) BP: 119/50 (01/22 0803) Pulse Rate: 66 (01/22 0803)  Labs: Recent Labs    01/25/24 1415 01/25/24 2250 01/26/24 0419 01/27/24 0438 01/27/24 0951 01/27/24 1841 01/28/24 0158 01/28/24 0800  HGB 7.5*   < > 7.2* 9.6*  --   --  8.5*  --   HCT 23.8*   < > 23.3* 30.1*  --   --  26.6*  --   PLT 9*   < > 25* 32*  --   --  24*  --   APTT  --    < > 39*  --  27 48* 70* 115*  LABPROT  --   --  22.5*  --  19.8*  --   --   --   INR  --   --  1.9*  --  1.6*  --   --   --   HEPARINUNFRC  --   --   --   --  0.61  --  0.58 0.61  CREATININE 1.20*  --  0.98 1.17*  --   --   --   --    < > = values in this interval not displayed.    Estimated Creatinine Clearance: 44.9 mL/min (A) (by C-G formula based on SCr of 1.17 mg/dL (H)).   Medical History: Past Medical History:  Diagnosis Date   allergic rhinitis    Seem to be year-round and especially seasonal in Spring/Fall   Broken ankle    Cataract    Chicken pox    COVID-19 01/14/2020   GERD (gastroesophageal reflux disease)    History of broken nose    Hx: UTI (urinary tract infection)    Kidney infection    Left breast mass 02/24/2019   Menopause, premature    Pancreatic cancer (HCC)    Raynaud's phenomenon    Rhinitis, nonallergic    SIRS (systemic inflammatory response syndrome) (HCC) 06/18/2023   Skin cancer 02/18/2019   Dr. Arin Isenstein [facility], 2nd instance on 03/19/2022   Type 2 diabetes mellitus with obesity 06/10/2021    Medications:  Previously on apixaban  5 mg twice daily- last dose 1/18  Assessment: Patient is a 69 year old with known DVT and PE  on apixaban  outpatient (last dose noted to be 1/18). She has a history of metastatic pancreatic cancer with liver metastases and presented with thrombocytopenia (PLTs 9). Per oncology: Patient last received chemotherapy about 6 weeks ago and therefore her low platelets and anemia is not explained by chemotherapy. Anticoagulation was held upon admission due to bleeding risk. Oncology's recommendation: Hold Eliquis  for now. If platelets consistently stay more than 25 but less than 50 we will plan to start her on half dose Eliquis  2.5 mg twice daily and if it is more than 50 we will consider starting her back on full dose Eliquis  5 mg twice daily. We could also consider bridging her with heparin  drip in a day or 2 until we know which way her platelets are heading before we get started on Eliquis .  Pharmacy has been consulted to initiate patient on a heparin  infusion w/no boluses.  Baseline labs:  INR 1.6 aPTT 27 HL 0.61 Hgb  9.6 PLT 32  Goal of Therapy:  Heparin  level 0.3-0.7 units/ml aPTT 66-102 seconds Monitor platelets by anticoagulation protocol: Yes  Date/Time: HL/aPTT: Rate/Comment: 1/21@1841  -- / 48s  Subtherapeutic@1100  units/hr 1/22 0158 0.58 / 70s Therapeutic x 1 1/22 0800 0.61 / 115 aPTT supra-therapeutic    Plan:  - Will not bolus patient at all given low PLTs and discussion with oncology - Decrease heparin  infusion rate to 1150 units/hr - Monitor via aPTT levels since heparin  level does not correlate - Check aPTT level and HL in 6 hours  - Monitor CBC daily while on heparin  infusion  Lum Mania, PharmD, BCPS 01/28/2024 8:40 AM           [1]  Allergies Allergen Reactions   Latex Itching and Rash

## 2024-01-28 NOTE — Discharge Instructions (Signed)
 [facility] Palliative care to follow as out pt

## 2024-01-28 NOTE — Progress Notes (Signed)
 PHARMACY - ANTICOAGULATION CONSULT NOTE  Pharmacy Consult for Heparin  Infusion- no bolus per oncology Indication: pulmonary embolus  Allergies[1]  Patient Measurements: Height: 5' 4 (162.6 cm) Weight: 74.7 kg (164 lb 12.7 oz) IBW/kg (Calculated) : 54.7 HEPARIN  DW (KG): 70.3  Vital Signs: Temp: 97.7 F (36.5 C) (01/22 0207) Temp Source: Oral (01/22 0207) BP: 110/54 (01/22 0207) Pulse Rate: 66 (01/22 0207)  Labs: Recent Labs    01/25/24 1415 01/25/24 1415 01/25/24 2250 01/25/24 2250 01/26/24 0419 01/27/24 0438 01/27/24 0951 01/27/24 1841 01/28/24 0158  HGB 7.5*   < > 7.0*  --  7.2* 9.6*  --   --   --   HCT 23.8*  --  22.4*  --  23.3* 30.1*  --   --   --   PLT 9*  --  27*  --  25* 32*  --   --   --   APTT  --   --   --    < > 39*  --  27 48* 70*  LABPROT  --   --   --   --  22.5*  --  19.8*  --   --   INR  --   --   --   --  1.9*  --  1.6*  --   --   HEPARINUNFRC  --   --   --   --   --   --  0.61  --  0.58  CREATININE 1.20*  --   --   --  0.98 1.17*  --   --   --    < > = values in this interval not displayed.    Estimated Creatinine Clearance: 44.9 mL/min (A) (by C-G formula based on SCr of 1.17 mg/dL (H)).   Medical History: Past Medical History:  Diagnosis Date   allergic rhinitis    Seem to be year-round and especially seasonal in Spring/Fall   Broken ankle    Cataract    Chicken pox    COVID-19 01/14/2020   GERD (gastroesophageal reflux disease)    History of broken nose    Hx: UTI (urinary tract infection)    Kidney infection    Left breast mass 02/24/2019   Menopause, premature    Pancreatic cancer (HCC)    Raynaud's phenomenon    Rhinitis, nonallergic    SIRS (systemic inflammatory response syndrome) (HCC) 06/18/2023   Skin cancer 02/18/2019   Dr. Arin Isenstein [facility], 2nd instance on 03/19/2022   Type 2 diabetes mellitus with obesity 06/10/2021    Medications:  Previously on apixaban  5 mg twice daily- last dose  1/18  Assessment: Patient is a 69 year old with known DVT and PE on apixaban  outpatient (last dose noted to be 1/18). She has a history of metastatic pancreatic cancer with liver metastases and presented with thrombocytopenia (PLTs 9). Per oncology: Patient last received chemotherapy about 6 weeks ago and therefore her low platelets and anemia is not explained by chemotherapy. Anticoagulation was held upon admission due to bleeding risk. Oncology's recommendation: Hold Eliquis  for now. If platelets consistently stay more than 25 but less than 50 we will plan to start her on half dose Eliquis  2.5 mg twice daily and if it is more than 50 we will consider starting her back on full dose Eliquis  5 mg twice daily. We could also consider bridging her with heparin  drip in a day or 2 until we know which way her platelets are heading before we get  started on Eliquis .  Pharmacy has been consulted to initiate patient on a heparin  infusion w/no boluses.  Baseline labs:  INR 1.6 aPTT 27 HL 0.61 Hgb 9.6 PLT 32  Goal of Therapy:  Heparin  level 0.3-0.7 units/ml aPTT 66-102 seconds Monitor platelets by anticoagulation protocol: Yes  Date/Time: HL/aPTT: Rate/Comment: 1/21@1841  -- / 48s  Subtherapeutic@1100  units/hr 1/22 0158 0.58 / 70s Therapeutic x 1   Plan:  - Will not bolus patient at all given low PLTs and discussion with oncology - Continue heparin  infusion rate to 1300 units/hr - Monitor via aPTT levels since heparin  level appears to still be elevated; transition to monitoring via HL when the two correlate - Recheck aPTT level in 6 hours to confirm - Monitor CBC daily while on heparin  infusion  Kathleen Russell, PharmD, [facility] 01/28/2024 2:44 AM          [1]  Allergies Allergen Reactions   Latex Itching and Rash

## 2024-01-29 ENCOUNTER — Telehealth: Payer: Self-pay | Admitting: *Deleted

## 2024-01-29 NOTE — Transitions of Care (Post Inpatient/ED Visit) (Signed)
" ° °  01/29/2024  Name: Kathleen Russell MRN: [id number] DOB: 12-08-1955  Today's TOC FU Call Status: Today's TOC FU Call Status:: Unsuccessful Call (1st Attempt) Unsuccessful Call (1st Attempt) Date: 01/29/24  Attempted to reach the patient regarding the most recent Inpatient/ED visit.  Follow Up Plan: Additional outreach attempts will be made to reach the patient to complete the Transitions of Care (Post Inpatient/ED visit) call.    Olam Ku, RN, BSN [place]  [facility], [facility] RN Care Manager Direct Dial: [phone]  Fax: [phone]   "

## 2024-02-01 ENCOUNTER — Other Ambulatory Visit: Payer: Self-pay | Admitting: Oncology

## 2024-02-01 ENCOUNTER — Telehealth: Payer: Self-pay

## 2024-02-01 NOTE — Transitions of Care (Post Inpatient/ED Visit) (Signed)
" ° °  02/01/2024  Name: Kathleen Russell MRN: [id number] DOB: August 04, 1955  Today's TOC FU Call Status: Today's TOC FU Call Status:: Unsuccessful Call (2nd Attempt) Unsuccessful Call (2nd Attempt) Date: 02/01/24  Attempted to reach the patient regarding the most recent Inpatient/ED visit.  Follow Up Plan: Additional outreach attempts will be made to reach the patient to complete the Transitions of Care (Post Inpatient/ED visit) call.   Arvin Seip RN, BSN, CCM [employer], Population Health Case Manager Phone: [phone]  "

## 2024-02-02 ENCOUNTER — Telehealth: Payer: Self-pay | Admitting: *Deleted

## 2024-02-02 ENCOUNTER — Inpatient Hospital Stay

## 2024-02-02 ENCOUNTER — Inpatient Hospital Stay: Admitting: Hospice and Palliative Medicine

## 2024-02-02 NOTE — Telephone Encounter (Signed)
 Patient will be enrolling into hospice services today at 3 pm. Needs apts with Josh and blood transfusion cnl. Msg sent to scheduling.

## 2024-02-02 NOTE — Transitions of Care (Post Inpatient/ED Visit) (Signed)
" ° °  02/02/2024  Name: Kathleen Russell MRN: [id number] DOB: 1955/06/05  Today's TOC FU Call Status: Today's TOC FU Call Status:: Unsuccessful Call (3rd Attempt) Unsuccessful Call (3rd Attempt) Date: 02/02/24  Attempted to reach the patient regarding the most recent Inpatient/ED visit.  Follow Up Plan: No further outreach attempts will be made at this time. We have been unable to contact the patient.   Olam Ku, RN, BSN [place]  [facility], [facility] RN Care Manager Direct Dial: [phone]  Fax: [phone]   "

## 2024-02-03 ENCOUNTER — Inpatient Hospital Stay

## 2024-08-23 ENCOUNTER — Ambulatory Visit
